# Patient Record
Sex: Female | Born: 1950 | Race: White | Hispanic: No | Marital: Single | Smoking: Current every day smoker
Health system: Southern US, Community
[De-identification: ages and names within clinical notes are randomized; demographics above are authoritative.]

## PROBLEM LIST (undated history)

## (undated) DIAGNOSIS — F329 Major depressive disorder, single episode, unspecified: Secondary | ICD-10-CM

## (undated) DIAGNOSIS — I341 Nonrheumatic mitral (valve) prolapse: Secondary | ICD-10-CM

## (undated) DIAGNOSIS — F32A Depression, unspecified: Secondary | ICD-10-CM

## (undated) DIAGNOSIS — G8929 Other chronic pain: Secondary | ICD-10-CM

## (undated) DIAGNOSIS — M1990 Unspecified osteoarthritis, unspecified site: Secondary | ICD-10-CM

## (undated) DIAGNOSIS — J45909 Unspecified asthma, uncomplicated: Secondary | ICD-10-CM

## (undated) DIAGNOSIS — F419 Anxiety disorder, unspecified: Secondary | ICD-10-CM

## (undated) DIAGNOSIS — E7800 Pure hypercholesterolemia, unspecified: Secondary | ICD-10-CM

## (undated) DIAGNOSIS — K219 Gastro-esophageal reflux disease without esophagitis: Secondary | ICD-10-CM

## (undated) DIAGNOSIS — M8580 Other specified disorders of bone density and structure, unspecified site: Secondary | ICD-10-CM

## (undated) DIAGNOSIS — K5900 Constipation, unspecified: Secondary | ICD-10-CM

## (undated) DIAGNOSIS — I10 Essential (primary) hypertension: Secondary | ICD-10-CM

## (undated) DIAGNOSIS — I499 Cardiac arrhythmia, unspecified: Secondary | ICD-10-CM

## (undated) HISTORY — PX: BREAST SURGERY: SHX581

## (undated) HISTORY — PX: ABDOMINAL HYSTERECTOMY: SHX81

## (undated) HISTORY — PX: ROTATOR CUFF REPAIR: SHX139

## (undated) HISTORY — PX: WRIST SURGERY: SHX841

## (undated) HISTORY — PX: BACK SURGERY: SHX140

## (undated) HISTORY — PX: TONSILLECTOMY: SUR1361

## (undated) HISTORY — PX: NECK SURGERY: SHX720

## (undated) HISTORY — PX: JOINT REPLACEMENT: SHX530

## (undated) HISTORY — PX: OTHER SURGICAL HISTORY: SHX169

---

## 1983-09-16 HISTORY — PX: ABDOMINAL HYSTERECTOMY: SHX81

## 1984-09-15 HISTORY — PX: ORIF WRIST FRACTURE: SHX2133

## 1999-05-09 ENCOUNTER — Encounter: Admission: RE | Admit: 1999-05-09 | Discharge: 1999-05-09 | Payer: Self-pay | Admitting: *Deleted

## 1999-09-26 ENCOUNTER — Emergency Department (HOSPITAL_COMMUNITY): Admission: EM | Admit: 1999-09-26 | Discharge: 1999-09-27 | Payer: Self-pay | Admitting: Emergency Medicine

## 1999-10-17 ENCOUNTER — Encounter: Payer: Self-pay | Admitting: Family Medicine

## 1999-10-17 ENCOUNTER — Encounter: Admission: RE | Admit: 1999-10-17 | Discharge: 1999-10-17 | Payer: Self-pay | Admitting: Family Medicine

## 2000-05-15 ENCOUNTER — Encounter: Payer: Self-pay | Admitting: Urology

## 2000-05-28 ENCOUNTER — Inpatient Hospital Stay (HOSPITAL_COMMUNITY): Admission: EM | Admit: 2000-05-28 | Discharge: 2000-05-30 | Payer: Self-pay | Admitting: Urology

## 2002-06-12 ENCOUNTER — Emergency Department (HOSPITAL_COMMUNITY): Admission: EM | Admit: 2002-06-12 | Discharge: 2002-06-12 | Payer: Self-pay | Admitting: Emergency Medicine

## 2002-08-04 ENCOUNTER — Encounter: Payer: Self-pay | Admitting: Neurosurgery

## 2002-08-04 ENCOUNTER — Observation Stay (HOSPITAL_COMMUNITY): Admission: RE | Admit: 2002-08-04 | Discharge: 2002-08-05 | Payer: Self-pay | Admitting: Neurosurgery

## 2003-06-07 ENCOUNTER — Emergency Department (HOSPITAL_COMMUNITY): Admission: EM | Admit: 2003-06-07 | Discharge: 2003-06-07 | Payer: Self-pay | Admitting: Emergency Medicine

## 2003-08-30 ENCOUNTER — Emergency Department (HOSPITAL_COMMUNITY): Admission: EM | Admit: 2003-08-30 | Discharge: 2003-08-30 | Payer: Self-pay | Admitting: Emergency Medicine

## 2003-12-19 ENCOUNTER — Emergency Department (HOSPITAL_COMMUNITY): Admission: EM | Admit: 2003-12-19 | Discharge: 2003-12-19 | Payer: Self-pay | Admitting: Emergency Medicine

## 2003-12-21 ENCOUNTER — Ambulatory Visit (HOSPITAL_COMMUNITY): Admission: RE | Admit: 2003-12-21 | Discharge: 2003-12-21 | Payer: Self-pay | Admitting: Family Medicine

## 2004-09-30 ENCOUNTER — Inpatient Hospital Stay (HOSPITAL_COMMUNITY): Admission: EM | Admit: 2004-09-30 | Discharge: 2004-10-04 | Payer: Self-pay | Admitting: Emergency Medicine

## 2004-10-22 ENCOUNTER — Ambulatory Visit: Payer: Self-pay | Admitting: Internal Medicine

## 2005-01-29 ENCOUNTER — Ambulatory Visit: Payer: Self-pay | Admitting: Internal Medicine

## 2005-08-01 ENCOUNTER — Ambulatory Visit: Payer: Self-pay | Admitting: Internal Medicine

## 2005-11-04 ENCOUNTER — Ambulatory Visit: Payer: Self-pay | Admitting: Internal Medicine

## 2006-01-08 ENCOUNTER — Ambulatory Visit: Payer: Self-pay | Admitting: Internal Medicine

## 2006-03-19 ENCOUNTER — Ambulatory Visit: Payer: Self-pay | Admitting: Internal Medicine

## 2006-03-25 ENCOUNTER — Ambulatory Visit (HOSPITAL_BASED_OUTPATIENT_CLINIC_OR_DEPARTMENT_OTHER): Admission: RE | Admit: 2006-03-25 | Discharge: 2006-03-26 | Payer: Self-pay | Admitting: Orthopedic Surgery

## 2006-10-07 ENCOUNTER — Ambulatory Visit (HOSPITAL_BASED_OUTPATIENT_CLINIC_OR_DEPARTMENT_OTHER): Admission: RE | Admit: 2006-10-07 | Discharge: 2006-10-08 | Payer: Self-pay | Admitting: Orthopedic Surgery

## 2007-03-31 ENCOUNTER — Ambulatory Visit: Payer: Self-pay | Admitting: Internal Medicine

## 2007-04-21 ENCOUNTER — Ambulatory Visit: Payer: Self-pay | Admitting: Internal Medicine

## 2007-05-10 ENCOUNTER — Emergency Department (HOSPITAL_COMMUNITY): Admission: EM | Admit: 2007-05-10 | Discharge: 2007-05-10 | Payer: Self-pay | Admitting: Emergency Medicine

## 2007-07-06 ENCOUNTER — Encounter: Payer: Self-pay | Admitting: Internal Medicine

## 2007-07-06 ENCOUNTER — Telehealth: Payer: Self-pay | Admitting: Internal Medicine

## 2007-07-20 ENCOUNTER — Telehealth: Payer: Self-pay | Admitting: Internal Medicine

## 2007-09-02 ENCOUNTER — Telehealth: Payer: Self-pay | Admitting: Internal Medicine

## 2007-09-08 ENCOUNTER — Telehealth: Payer: Self-pay | Admitting: Internal Medicine

## 2007-10-14 ENCOUNTER — Telehealth: Payer: Self-pay | Admitting: Internal Medicine

## 2007-11-11 ENCOUNTER — Encounter: Payer: Self-pay | Admitting: Internal Medicine

## 2007-11-11 DIAGNOSIS — F329 Major depressive disorder, single episode, unspecified: Secondary | ICD-10-CM | POA: Insufficient documentation

## 2007-11-11 DIAGNOSIS — M810 Age-related osteoporosis without current pathological fracture: Secondary | ICD-10-CM | POA: Insufficient documentation

## 2007-11-11 DIAGNOSIS — F32A Depression, unspecified: Secondary | ICD-10-CM | POA: Insufficient documentation

## 2007-11-17 ENCOUNTER — Ambulatory Visit: Payer: Self-pay | Admitting: Internal Medicine

## 2007-11-17 DIAGNOSIS — M542 Cervicalgia: Secondary | ICD-10-CM | POA: Insufficient documentation

## 2007-11-18 ENCOUNTER — Telehealth: Payer: Self-pay | Admitting: Internal Medicine

## 2007-11-21 ENCOUNTER — Encounter: Payer: Self-pay | Admitting: Internal Medicine

## 2007-12-13 ENCOUNTER — Telehealth: Payer: Self-pay | Admitting: Internal Medicine

## 2008-01-09 ENCOUNTER — Encounter: Payer: Self-pay | Admitting: Internal Medicine

## 2008-01-10 ENCOUNTER — Emergency Department (HOSPITAL_COMMUNITY): Admission: EM | Admit: 2008-01-10 | Discharge: 2008-01-10 | Payer: Self-pay | Admitting: Emergency Medicine

## 2008-01-27 ENCOUNTER — Emergency Department (HOSPITAL_COMMUNITY): Admission: EM | Admit: 2008-01-27 | Discharge: 2008-01-28 | Payer: Self-pay | Admitting: Emergency Medicine

## 2008-02-14 ENCOUNTER — Emergency Department (HOSPITAL_COMMUNITY): Admission: EM | Admit: 2008-02-14 | Discharge: 2008-02-14 | Payer: Self-pay | Admitting: Emergency Medicine

## 2008-03-01 ENCOUNTER — Encounter: Payer: Self-pay | Admitting: Emergency Medicine

## 2008-03-01 ENCOUNTER — Inpatient Hospital Stay (HOSPITAL_COMMUNITY): Admission: EM | Admit: 2008-03-01 | Discharge: 2008-03-05 | Payer: Self-pay | Admitting: Internal Medicine

## 2008-03-10 ENCOUNTER — Encounter
Admission: RE | Admit: 2008-03-10 | Discharge: 2008-06-08 | Payer: Self-pay | Admitting: Physical Medicine & Rehabilitation

## 2008-03-14 ENCOUNTER — Ambulatory Visit: Payer: Self-pay | Admitting: Physical Medicine & Rehabilitation

## 2008-04-10 ENCOUNTER — Ambulatory Visit: Payer: Self-pay | Admitting: Physical Medicine & Rehabilitation

## 2008-05-05 ENCOUNTER — Ambulatory Visit: Payer: Self-pay | Admitting: Physical Medicine & Rehabilitation

## 2008-06-01 ENCOUNTER — Ambulatory Visit: Payer: Self-pay | Admitting: Physical Medicine & Rehabilitation

## 2008-06-23 ENCOUNTER — Encounter
Admission: RE | Admit: 2008-06-23 | Discharge: 2008-09-21 | Payer: Self-pay | Admitting: Physical Medicine & Rehabilitation

## 2008-06-27 ENCOUNTER — Ambulatory Visit: Payer: Self-pay | Admitting: Physical Medicine & Rehabilitation

## 2008-07-12 ENCOUNTER — Emergency Department (HOSPITAL_BASED_OUTPATIENT_CLINIC_OR_DEPARTMENT_OTHER): Admission: EM | Admit: 2008-07-12 | Discharge: 2008-07-12 | Payer: Self-pay | Admitting: Emergency Medicine

## 2008-07-25 ENCOUNTER — Ambulatory Visit: Payer: Self-pay | Admitting: Physical Medicine & Rehabilitation

## 2008-08-21 ENCOUNTER — Ambulatory Visit: Payer: Self-pay | Admitting: Physical Medicine & Rehabilitation

## 2008-09-18 ENCOUNTER — Encounter
Admission: RE | Admit: 2008-09-18 | Discharge: 2008-12-17 | Payer: Self-pay | Admitting: Physical Medicine & Rehabilitation

## 2008-09-19 ENCOUNTER — Ambulatory Visit: Payer: Self-pay | Admitting: Physical Medicine & Rehabilitation

## 2008-10-17 ENCOUNTER — Ambulatory Visit: Payer: Self-pay | Admitting: Physical Medicine & Rehabilitation

## 2008-10-17 ENCOUNTER — Emergency Department (HOSPITAL_COMMUNITY): Admission: EM | Admit: 2008-10-17 | Discharge: 2008-10-18 | Payer: Self-pay | Admitting: Emergency Medicine

## 2008-11-14 ENCOUNTER — Ambulatory Visit: Payer: Self-pay | Admitting: Physical Medicine & Rehabilitation

## 2008-11-30 ENCOUNTER — Ambulatory Visit (HOSPITAL_COMMUNITY)
Admission: RE | Admit: 2008-11-30 | Discharge: 2008-11-30 | Payer: Self-pay | Admitting: Physical Medicine & Rehabilitation

## 2008-12-01 ENCOUNTER — Encounter: Admission: RE | Admit: 2008-12-01 | Discharge: 2009-03-01 | Payer: Self-pay | Admitting: Anesthesiology

## 2008-12-05 ENCOUNTER — Ambulatory Visit: Payer: Self-pay | Admitting: Anesthesiology

## 2008-12-12 ENCOUNTER — Encounter
Admission: RE | Admit: 2008-12-12 | Discharge: 2008-12-18 | Payer: Self-pay | Admitting: Physical Medicine & Rehabilitation

## 2008-12-18 ENCOUNTER — Ambulatory Visit: Payer: Self-pay | Admitting: Physical Medicine & Rehabilitation

## 2009-01-16 ENCOUNTER — Ambulatory Visit: Payer: Self-pay | Admitting: Anesthesiology

## 2009-02-14 ENCOUNTER — Encounter
Admission: RE | Admit: 2009-02-14 | Discharge: 2009-02-14 | Payer: Self-pay | Admitting: Physical Medicine & Rehabilitation

## 2009-03-31 ENCOUNTER — Emergency Department (HOSPITAL_COMMUNITY): Admission: EM | Admit: 2009-03-31 | Discharge: 2009-04-01 | Payer: Self-pay | Admitting: Emergency Medicine

## 2009-04-01 ENCOUNTER — Inpatient Hospital Stay (HOSPITAL_COMMUNITY): Admission: EM | Admit: 2009-04-01 | Discharge: 2009-04-03 | Payer: Self-pay | Admitting: Emergency Medicine

## 2009-04-04 ENCOUNTER — Emergency Department (HOSPITAL_COMMUNITY): Admission: EM | Admit: 2009-04-04 | Discharge: 2009-04-04 | Payer: Self-pay | Admitting: Emergency Medicine

## 2009-11-16 ENCOUNTER — Emergency Department (HOSPITAL_COMMUNITY): Admission: EM | Admit: 2009-11-16 | Discharge: 2009-11-16 | Payer: Self-pay | Admitting: Emergency Medicine

## 2009-12-18 ENCOUNTER — Emergency Department (HOSPITAL_COMMUNITY): Admission: EM | Admit: 2009-12-18 | Discharge: 2009-12-18 | Payer: Self-pay | Admitting: Emergency Medicine

## 2010-01-05 ENCOUNTER — Observation Stay (HOSPITAL_COMMUNITY): Admission: EM | Admit: 2010-01-05 | Discharge: 2010-01-06 | Payer: Self-pay | Admitting: Emergency Medicine

## 2010-02-21 ENCOUNTER — Ambulatory Visit: Payer: Self-pay | Admitting: Family Medicine

## 2010-02-21 DIAGNOSIS — E785 Hyperlipidemia, unspecified: Secondary | ICD-10-CM | POA: Insufficient documentation

## 2010-02-25 ENCOUNTER — Encounter (INDEPENDENT_AMBULATORY_CARE_PROVIDER_SITE_OTHER): Payer: Self-pay | Admitting: *Deleted

## 2010-02-28 ENCOUNTER — Telehealth (INDEPENDENT_AMBULATORY_CARE_PROVIDER_SITE_OTHER): Payer: Self-pay | Admitting: *Deleted

## 2010-03-01 ENCOUNTER — Telehealth: Payer: Self-pay | Admitting: Family Medicine

## 2010-03-07 ENCOUNTER — Telehealth (INDEPENDENT_AMBULATORY_CARE_PROVIDER_SITE_OTHER): Payer: Self-pay | Admitting: *Deleted

## 2010-03-14 ENCOUNTER — Ambulatory Visit: Payer: Self-pay | Admitting: Internal Medicine

## 2010-03-14 ENCOUNTER — Encounter (INDEPENDENT_AMBULATORY_CARE_PROVIDER_SITE_OTHER): Payer: Self-pay | Admitting: *Deleted

## 2010-03-19 ENCOUNTER — Telehealth: Payer: Self-pay | Admitting: Internal Medicine

## 2010-03-27 ENCOUNTER — Encounter: Payer: Self-pay | Admitting: Sports Medicine

## 2010-03-27 ENCOUNTER — Telehealth: Payer: Self-pay | Admitting: Sports Medicine

## 2010-03-27 ENCOUNTER — Ambulatory Visit: Payer: Self-pay | Admitting: Family Medicine

## 2010-03-27 DIAGNOSIS — Z9889 Other specified postprocedural states: Secondary | ICD-10-CM | POA: Insufficient documentation

## 2010-04-16 ENCOUNTER — Telehealth (INDEPENDENT_AMBULATORY_CARE_PROVIDER_SITE_OTHER): Payer: Self-pay | Admitting: *Deleted

## 2010-04-18 ENCOUNTER — Ambulatory Visit: Payer: Self-pay | Admitting: Internal Medicine

## 2010-04-18 ENCOUNTER — Ambulatory Visit (HOSPITAL_COMMUNITY): Admission: RE | Admit: 2010-04-18 | Discharge: 2010-04-18 | Payer: Self-pay | Admitting: Internal Medicine

## 2010-04-20 ENCOUNTER — Encounter: Payer: Self-pay | Admitting: Internal Medicine

## 2010-04-29 ENCOUNTER — Telehealth: Payer: Self-pay | Admitting: Sports Medicine

## 2010-04-30 ENCOUNTER — Encounter: Payer: Self-pay | Admitting: Sports Medicine

## 2010-05-15 ENCOUNTER — Telehealth: Payer: Self-pay | Admitting: Internal Medicine

## 2010-05-15 ENCOUNTER — Encounter: Payer: Self-pay | Admitting: Internal Medicine

## 2010-05-22 ENCOUNTER — Telehealth: Payer: Self-pay | Admitting: Sports Medicine

## 2010-06-01 ENCOUNTER — Telehealth: Payer: Self-pay | Admitting: Family Medicine

## 2010-06-01 ENCOUNTER — Observation Stay (HOSPITAL_COMMUNITY): Admission: EM | Admit: 2010-06-01 | Discharge: 2010-06-01 | Payer: Self-pay | Admitting: Emergency Medicine

## 2010-06-03 ENCOUNTER — Telehealth: Payer: Self-pay | Admitting: Sports Medicine

## 2010-06-04 ENCOUNTER — Telehealth: Payer: Self-pay | Admitting: Sports Medicine

## 2010-06-14 ENCOUNTER — Telehealth: Payer: Self-pay | Admitting: Sports Medicine

## 2010-06-20 ENCOUNTER — Encounter: Payer: Self-pay | Admitting: Sports Medicine

## 2010-07-31 ENCOUNTER — Encounter: Admission: RE | Admit: 2010-07-31 | Discharge: 2010-07-31 | Payer: Self-pay | Admitting: Orthopaedic Surgery

## 2010-09-26 ENCOUNTER — Encounter: Payer: Self-pay | Admitting: *Deleted

## 2010-09-28 ENCOUNTER — Emergency Department (HOSPITAL_COMMUNITY)
Admission: EM | Admit: 2010-09-28 | Discharge: 2010-09-29 | Payer: Self-pay | Source: Home / Self Care | Admitting: Emergency Medicine

## 2010-09-29 DIAGNOSIS — IMO0002 Reserved for concepts with insufficient information to code with codable children: Secondary | ICD-10-CM

## 2010-09-30 LAB — RAPID URINE DRUG SCREEN, HOSP PERFORMED
Amphetamines: NOT DETECTED
Barbiturates: NOT DETECTED
Benzodiazepines: POSITIVE — AB
Cocaine: NOT DETECTED
Opiates: NOT DETECTED
Tetrahydrocannabinol: NOT DETECTED

## 2010-09-30 LAB — CBC
HCT: 38.1 % (ref 36.0–46.0)
Hemoglobin: 12.8 g/dL (ref 12.0–15.0)
MCH: 31.4 pg (ref 26.0–34.0)
MCHC: 33.6 g/dL (ref 30.0–36.0)
MCV: 93.6 fL (ref 78.0–100.0)
Platelets: 296 10*3/uL (ref 150–400)
RBC: 4.07 MIL/uL (ref 3.87–5.11)
RDW: 14.4 % (ref 11.5–15.5)
WBC: 6.7 10*3/uL (ref 4.0–10.5)

## 2010-09-30 LAB — DIFFERENTIAL
Basophils Absolute: 0.1 10*3/uL (ref 0.0–0.1)
Basophils Relative: 1 % (ref 0–1)
Eosinophils Absolute: 0.2 10*3/uL (ref 0.0–0.7)
Eosinophils Relative: 4 % (ref 0–5)
Lymphocytes Relative: 36 % (ref 12–46)
Lymphs Abs: 2.4 10*3/uL (ref 0.7–4.0)
Monocytes Absolute: 0.6 10*3/uL (ref 0.1–1.0)
Monocytes Relative: 9 % (ref 3–12)
Neutro Abs: 3.4 10*3/uL (ref 1.7–7.7)
Neutrophils Relative %: 51 % (ref 43–77)

## 2010-09-30 LAB — URINALYSIS, ROUTINE W REFLEX MICROSCOPIC
Bilirubin Urine: NEGATIVE
Ketones, ur: NEGATIVE mg/dL
Leukocytes, UA: NEGATIVE
Nitrite: NEGATIVE
Protein, ur: NEGATIVE mg/dL
Specific Gravity, Urine: 1.008 (ref 1.005–1.030)
Urine Glucose, Fasting: NEGATIVE mg/dL
Urobilinogen, UA: 0.2 mg/dL (ref 0.0–1.0)
pH: 6 (ref 5.0–8.0)

## 2010-09-30 LAB — BASIC METABOLIC PANEL
BUN: 10 mg/dL (ref 6–23)
CO2: 27 mEq/L (ref 19–32)
Calcium: 9.4 mg/dL (ref 8.4–10.5)
Chloride: 108 mEq/L (ref 96–112)
Creatinine, Ser: 0.79 mg/dL (ref 0.4–1.2)
GFR calc Af Amer: >60 mL/min (ref >60)
GFR calc non Af Amer: >60 mL/min (ref >60)
Glucose, Bld: 105 mg/dL — ABNORMAL HIGH (ref 70–99)
Potassium: 4.3 mEq/L (ref 3.5–5.1)
Sodium: 140 mEq/L (ref 135–145)

## 2010-09-30 LAB — URINE MICROSCOPIC-ADD ON

## 2010-09-30 LAB — ETHANOL: Alcohol, Ethyl (B): <5 mg/dL (ref 0–10)

## 2010-10-05 ENCOUNTER — Encounter: Payer: Self-pay | Admitting: Orthopaedic Surgery

## 2010-10-06 ENCOUNTER — Encounter: Payer: Self-pay | Admitting: Physical Medicine & Rehabilitation

## 2010-10-06 ENCOUNTER — Observation Stay (HOSPITAL_COMMUNITY)
Admission: EM | Admit: 2010-10-06 | Discharge: 2010-10-07 | Payer: Self-pay | Source: Home / Self Care | Admitting: Emergency Medicine

## 2010-10-06 ENCOUNTER — Encounter: Payer: Self-pay | Admitting: Internal Medicine

## 2010-10-08 LAB — CK TOTAL AND CKMB (NOT AT ARMC)
CK, MB: 4.2 ng/mL — ABNORMAL HIGH (ref 0.3–4.0)
Relative Index: 3 — ABNORMAL HIGH (ref 0.0–2.5)

## 2010-10-08 LAB — URINALYSIS, ROUTINE W REFLEX MICROSCOPIC
Bilirubin Urine: NEGATIVE
Ketones, ur: NEGATIVE mg/dL
Protein, ur: NEGATIVE mg/dL
Urine Glucose, Fasting: NEGATIVE mg/dL
pH: 6.5 (ref 5.0–8.0)

## 2010-10-08 LAB — WET PREP, GENITAL
Clue Cells Wet Prep HPF POC: NONE SEEN
Trich, Wet Prep: NONE SEEN

## 2010-10-08 LAB — CBC
Platelets: 311 10*3/uL (ref 150–400)
RBC: 3.93 MIL/uL (ref 3.87–5.11)
RDW: 14.3 % (ref 11.5–15.5)
WBC: 5.5 10*3/uL (ref 4.0–10.5)

## 2010-10-08 LAB — TROPONIN I: Troponin I: 0.02 ng/mL (ref 0.00–0.06)

## 2010-10-08 LAB — POCT I-STAT, CHEM 8
BUN: 18 mg/dL (ref 6–23)
Calcium, Ion: 1.07 mmol/L — ABNORMAL LOW (ref 1.12–1.32)
Chloride: 108 mEq/L (ref 96–112)
Glucose, Bld: 93 mg/dL (ref 70–99)
HCT: 37 % (ref 36.0–46.0)
TCO2: 24 mmol/L (ref 0–100)

## 2010-10-08 LAB — POCT CARDIAC MARKERS
CKMB, poc: <1 ng/mL — ABNORMAL LOW (ref 1.0–8.0)
CKMB, poc: <1 ng/mL — ABNORMAL LOW (ref 1.0–8.0)
CKMB, poc: 1.2 ng/mL (ref 1.0–8.0)
Myoglobin, poc: 49.9 ng/mL (ref 12–200)
Troponin i, poc: <0.05 ng/mL (ref 0.00–0.09)
Troponin i, poc: <0.05 ng/mL (ref 0.00–0.09)
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
## 2010-10-15 NOTE — Progress Notes (Signed)
Summary: phn msg  Phone Note Call from Patient Call back at Home Phone [phone]   Caller: Patient Summary of Call: needs to talk to nurse about pain clinic - wanted to know if she can try [facility]. Initial call taken by: De Nurse,  March 01, 2010 12:01 PM  Follow-up for Phone Call        spoke with patient and she doesn't know of a Pain Clinic in [facility]. advised her if she can find name of facility I will send in referral. we also looked on internet and cannot find a clinic in [facility].  patient states she had her records sent form Dr. Daleen Snook to the [facility] and they said they would not take her but  she wants [place] to try to make referral. advised her I will try. Follow-up by: Theresia Lo RN,  March 01, 2010 3:17 PM

## 2010-10-15 NOTE — Progress Notes (Signed)
Summary: [name]  Phone Note Call from Patient Call back at [facility] Phone [phone]   Caller: Patient Summary of Call: Checking on referral to painn clinic. Initial call taken by: Clydell Hakim,  February 28, 2010 2:03 PM  Follow-up for Phone Call        spoke with patient and advised  her that both pain clinics I had sent in referrals to declined to make appointment for her. also she has been to Dr. Vear [facility] pain clinic also in town. advised will discuss with Dr. Lafonda Mosses next week when she returns for further suggestions. Follow-up by: Theresia Lo RN,  February 28, 2010 4:36 PM  Additional Follow-up for Phone Call Additional follow up Details #1::        I don't know of anywhere else in town we can refer to.  However, if she is intersted, we could look into [facility] or [facility] (but she told me that she did not have transportation to get there).    If she is not interested in the academic centers, I would advise her to make a follow up appointment here to discuss alternatives to narcotics for managin g her pain.  THanks Additional Follow-up by: Asher Muir MD,  March 04, 2010 12:06 PM    Additional Follow-up for Phone Call Additional follow up Details #2::    patient is not interested in going to [facility] or [facility]. she does not have a car now. referral has been sent to [facility] today but patient states she had all her records form Dr. Baxter Hire sent there and they actually declined to see her. However she felt that maybe if we sent in the referral they would possibly take her. she does want to make appointment with her new doctor  will need to see who she will be assigned to. Follow-up by: Theresia Lo RN,  March 04, 2010 3:58 PM  Additional Follow-up for Phone Call Additional follow up Details #3:: Details for Additional Follow-up Action Taken: Has been reassigned to Dr. Benjamin Stain. Additional Follow-up by: Dennison Nancy RN,  March 04, 2010 4:11 PM  appointment scheduled  with Dr. Benjamin Stain for 03/20/2010. Theresia Lo RN  March 04, 2010 4:22 PM

## 2010-10-15 NOTE — Procedures (Signed)
Summary: Colonoscopy  Patient: [name] Note: All result statuses are Final unless otherwise noted.  Tests: (1) Colonoscopy (COL)   COL Colonoscopy           DONE     [facility]     [address] [place], [place]  [address]           COLONOSCOPY PROCEDURE REPORT           PATIENT:  Annette, Williams  MR#:  [id number]     BIRTHDATE:  [birth year], 59 yrs. old  GENDER:  female     ENDOSCOPIST:  Hedwig Morton. Juanda Chance, MD     REF. BY:     PROCEDURE DATE:  04/18/2010     PROCEDURE:  Colonoscopy 25956     ASA CLASS:  Class II     INDICATIONS:  screening last colon 10 years ago     MEDICATIONS:   See Anesthesia Report.           DESCRIPTION OF PROCEDURE:   After the risks benefits and     alternatives of the procedure were thoroughly explained, informed     consent was obtained.  Digital rectal exam was performed and     revealed no rectal masses.   The EC-[id number] ([id number]) endoscope was     introduced through the anus and advanced to the cecum, which was     identified by both the appendix and ileocecal valve, without     limitations.  The quality of the prep was good, using MiraLax.     The instrument was then slowly withdrawn as the colon was fully     examined.     <<PROCEDUREIMAGES>>           FINDINGS:  Two polyps were found. 8mm sessile polyp right colon     5 mm sessile polyps at 70 cm The polyps were removed using cold     biopsy forceps (see image003, image004, and image005).  Mild     diverticulosis was found (see image001).  This was otherwise a     normal examination of the colon (see image002, image001, and     image006).   Retroflexed views in the rectum revealed no     abnormalities.    The scope was then withdrawn from the patient     and the procedure completed.           COMPLICATIONS:  None     ENDOSCOPIC IMPRESSION:     1) Two polyps     2) Mild diverticulosis     3) Otherwise normal examination     s/p polypectomy     RECOMMENDATIONS:     1) Await  pathology results     2) High fiber diet with liberal fluid intake.     REPEAT EXAM:  In 5 year(s) for.           ______________________________     Hedwig Morton. Juanda Chance, MD           CC:  [facility]           n.     eSIGNED:   Hedwig Morton. Brodie at 04/18/2010 12:29 PM           [name], [name], [id number]  Note: An exclamation mark (!) indicates a result that was not dispersed into the flowsheet. Document Creation Date: 04/18/2010 12:30 PM _______________________________________________________________________  (1) Order result status: Final Collection or observation date-time:  04/18/2010 12:18 Requested date-time:  Receipt date-time:  Reported date-time:  Referring Physician:   Ordering Physician: Lina Sar [phone]) Specimen Source:  Source: Launa Grill Order Number: [id number] Lab site:

## 2010-10-15 NOTE — Progress Notes (Signed)
Summary: refill  Phone Note Refill Request Call back at Home Phone [phone] Message from:  Patient  would like a refill on albuterol for her to use occasionally [facility]  Initial call taken by: De Nurse,  March 27, 2010 2:20 PM  Follow-up for Phone Call        Hmm, not on our med list and no hx asthma.  Would need to see her if she is getting wheezy or SOB to work this up. Follow-up by: Rodney Langton MD,  March 27, 2010 2:28 PM  Additional Follow-up for Phone Call Additional follow up Details #1::        called and left message for her to make appt, Additional Follow-up by: De Nurse,  March 29, 2010 8:56 AM    Additional Follow-up for Phone Call Additional follow up Details #2::    Noted, pt will have office visit re: SOB. Follow-up by: Rodney Langton MD,  March 29, 2010 4:11 PM

## 2010-10-15 NOTE — Letter (Signed)
Summary: New Patient letter  [facility]  [address] [place], [place] [address]   Phone: [phone]  Fax: [phone]       05/15/2010 MRN: [id number]  [facility] [address] [place], [place]  [address]  Dear Ms. [facility],  Welcome to the Gastroenterology Division at [facility].    You are scheduled to see Dr.  Juanda Chance  on 07/01/10 at 8:45 a.m.  on the 3rd floor at [facility], [address]  We ask that you try to arrive at our office 15 minutes prior to your appointment time to allow for check-in.  We would like you to complete the enclosed self-administered evaluation form prior to your visit and bring it with you on the day of your appointment.  We will review it with you.  Also, please bring a complete list of all your medications or, if you prefer, bring the medication bottles and we will list them.  Please bring your insurance card so that we may make a copy of it.  If your insurance requires a referral to see a specialist, please bring your referral form from your primary care physician.  Co-payments are due at the time of your visit and may be paid by cash, check or credit card.     Your office visit will consist of a consult with your physician (includes a physical exam), any laboratory testing he/she may order, scheduling of any necessary diagnostic testing (e.g. x-ray, ultrasound, CT-scan), and scheduling of a procedure (e.g. Endoscopy, Colonoscopy) if required.  Please allow enough time on your schedule to allow for any/all of these possibilities.    If you cannot keep your appointment, please call [phone] to cancel or reschedule prior to your appointment date.  This allows [place] the opportunity to schedule an appointment for another patient in need of care.  If you do not cancel or reschedule by 5 p.m. the business day prior to your appointment date, you will be charged a $50.00 late cancellation/no-show fee.    Thank you for  choosing [facility] for your medical needs.  We appreciate the opportunity to care for you.  Please visit [place] at our website  to learn more about our practice.                     Sincerely,                                                             The Gastroenterology Division

## 2010-10-15 NOTE — Progress Notes (Signed)
Summary: Triage  Phone Note Call from Patient Call back at Home Phone [phone]   Caller: Patient Call For: Dr. Juanda Chance Reason for Call: Talk to Nurse Summary of Call: Pt. needs to r/s COL at the hosp. on 04-18-10 b/c she is going to be out of town Initial call taken by: Karna Christmas,  March 19, 2010 4:14 PM  Follow-up for Phone Call        Pt. is scheduled for a Colon/Propofol at [facility] on 04-18-10, she can move procedure to 05-16-10. Pt. declines, she doesn't want to wait until Sept., she will keep her appt. date on 04-18-10. Pt. instructed to call back as needed.  Follow-up by: Laureen Ochs LPN,  March 20, 9628 9:50 AM

## 2010-10-15 NOTE — Letter (Signed)
Summary: Patient Notice- Polyp Results  [facility]  [address] [place], [place] [address]   Phone: [phone]  Fax: [phone]        April 20, 2010 MRN: [id number]    [facility] [address] [place], [place]  [address]    Dear Ms. Bendix,  I am pleased to inform you that the colon polyp(s) removed during your recent colonoscopy was (were) found to be benign (no cancer detected) upon pathologic examination.The polyps were adenomatous ( precancerous)  I recommend you have a repeat colonoscopy examination in 5_ years to look for recurrent polyps, as having colon polyps increases your risk for having recurrent polyps or even colon cancer in the future.  Should you develop new or worsening symptoms of abdominal pain, bowel habit changes or bleeding from the rectum or bowels, please schedule an evaluation with either your primary care physician or with me.  Additional information/recommendations:  _x_ No further action with gastroenterology is needed at this time. Please      follow-up with your primary care physician for your other healthcare      needs.  __ Please call [phone] to schedule a return visit to review your      situation.  __ Please keep your follow-up visit as already scheduled.  __ Continue treatment plan as outlined the day of your exam.  Please call us if you are having persistent problems or have questions about your condition that have not been fully answered at this time.  Sincerely,  Hart Carwin MD  This letter has been electronically signed by your physician.  Appended Document: Patient Notice- Polyp Results Letter mailed to pt.

## 2010-10-15 NOTE — Progress Notes (Signed)
Summary: ? re prep  Phone Note Call from Patient Call back at Home Phone [phone]   Caller: Patient Call For: Juanda Chance Reason for Call: Talk to Nurse Summary of Call: Patient has questions regarding prep  Initial call taken by: Tawni Levy,  March 19, 2010 10:44 AM  Follow-up for Phone Call        Spoke with pt. She did not have questions about the prep but wanted to reschedule her colonoscopy appt. Will tranfer pt to scheduling to change her appt She will call back if she has further questions. Ulis Rias RN  March 19, 2010 12:37 PM

## 2010-10-15 NOTE — Progress Notes (Signed)
Summary: triage  Phone Note Call from Patient Call back at Home Phone [phone]   Caller: Patient Summary of Call: Pt went to ed for pain in her back.  Explained that she needed see Dr. Benjamin Stain per phone note by Dr. Jeanice Lim.  Pt says she is unable to come in due to hurting so bad.   Initial call taken by: Clydell Hakim,  June 03, 2010 9:07 AM  Follow-up for Phone Call        states they did xrays in ED. states her spine looks like an S.  states she was aware of this before the xray. states she cannot stand or function without pain meds. "I hurt so bad" "there is nothing I can do". has no ride to come in. states she got 3 shots of dilaudid & a written rx for percocet #20 when in ED.   takes 2 at a time & the relief lasts 2 hours. asked her to try & find a ride & call back for an appt. states she will.Golden Circle RN  June 03, 2010 9:31 AM  Follow-up by: Golden Circle RN,  June 03, 2010 9:24 AM  Additional Follow-up for Phone Call Additional follow up Details #1::        she can be here at 9:30 tomorrow. states she wants pain pills. appt made with Dr. Clotilde Dieter as pcp will not be here. message to pcp & [place] to discuss before appt Additional Follow-up by: Golden Circle RN,  June 03, 2010 9:52 AM    Additional Follow-up for Phone Call Additional follow up Details #2::    pt cancelled appt tues & will see pcp thursday Follow-up by: Golden Circle RN,  June 03, 2010 10:06 AM  Additional Follow-up for Phone Call Additional follow up Details #3:: Details for Additional Follow-up Action Taken: Noted, will see pt thursday. Additional Follow-up by: Rodney Langton MD,  June 03, 2010 11:16 AM

## 2010-10-15 NOTE — Letter (Signed)
Summary: Certification of Disability/Property Tax Exclusion  Certification of Disability/Property Tax Exclusion   Imported By: Knox Royalty 04/06/2010 15:59:04  _____________________________________________________________________  External Attachment:    Type:   Image     Comment:   External Document

## 2010-10-15 NOTE — Miscellaneous (Signed)
Summary: [facility] previsit  Clinical Lists Changes  Problems: Added new problem of SPECIAL SCREENING FOR MALIGNANT NEOPLASMS COLON (ICD-V76.51) Medications: Added new medication of DULCOLAX 5 MG  TBEC (BISACODYL) Day before procedure take 2 at 3pm and 2 at 8pm. - Signed Added new medication of METOCLOPRAMIDE HCL 10 MG  TABS (METOCLOPRAMIDE HCL) As per prep instructions. - Signed Added new medication of MIRALAX   POWD (POLYETHYLENE GLYCOL 3350) As per prep  instructions. - Signed Rx of DULCOLAX 5 MG  TBEC (BISACODYL) Day before procedure take 2 at 3pm and 2 at 8pm.;  #4 x 0;  Signed;  Entered by: Karl Bales RN;  Authorized by: Hart Carwin MD;  Method used: Electronically to [facility] [phone] [place], [place], [place]  [address], Ph: [phone], Fax: [phone] Rx of METOCLOPRAMIDE HCL 10 MG  TABS (METOCLOPRAMIDE HCL) As per prep instructions.;  #2 x 0;  Signed;  Entered by: Karl Bales RN;  Authorized by: Hart Carwin MD;  Method used: Electronically to [facility] [phone] [place], [place], [place]  [address], Ph: [phone], Fax: [phone] Rx of MIRALAX   POWD (POLYETHYLENE GLYCOL 3350) As per prep  instructions.;  #255gm x 0;  Signed;  Entered by: Karl Bales RN;  Authorized by: Hart Carwin MD;  Method used: Electronically to [facility] [phone] [place], [place], [place]  [address], Ph: [phone], Fax: [phone] Orders: Added new Test order of ZCOL (ZCOL) - Signed Allergies: Added new allergy or adverse reaction of DARVOCET Added new allergy or adverse reaction of HYDROCODONE    Prescriptions: MIRALAX   POWD (POLYETHYLENE GLYCOL 3350) As per prep  instructions.  #255gm x 0   Entered by:   Karl Bales RN   Authorized by:   Hart Carwin MD   Signed by:   Karl Bales RN on 03/14/2010   Method used:   Electronically to        [facility] [address] (retail)       [phone]       [place], [place]  [address]       Ph: [phone]       Fax: [phone]   RxID:   [id number] METOCLOPRAMIDE HCL 10 MG  TABS (METOCLOPRAMIDE HCL) As per prep instructions.  #2 x 0   Entered by:   Karl Bales RN   Authorized by:   Hart Carwin MD   Signed by:   Karl Bales RN on 03/14/2010   Method used:   Electronically to        [facility] [address] (retail)       [phone]       [place], [place]  [address]       Ph: [phone]       Fax: [phone]   RxID:   [id number] DULCOLAX 5 MG  TBEC (BISACODYL) Day before procedure take 2 at 3pm and 2 at 8pm.  #4 x 0   Entered by:   Karl Bales RN   Authorized by:   Hart Carwin MD   Signed by:   Karl Bales RN on 03/14/2010   Method used:   Electronically to        [facility] [address] (retail)       [phone]  [place], [place]  [address]       Ph: [phone]       Fax: [phone]   RxID:   [id number]   Appended Document: [facility] previsit ---- 03/19/2010 7:36 AM, Karl Bales RN wrote: Renato Gails.  Dr. Juanda Chance said she agreed that propofol is appropriate.  Thanks so much!
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
## 2010-10-15 NOTE — Procedures (Signed)
Summary: Prep/[facility]  Prep/[facility]   Imported By: Lester [place] 03/25/2010 08:03:15  _____________________________________________________________________  External Attachment:    Type:   Image     Comment:   External Document

## 2010-10-15 NOTE — Progress Notes (Signed)
Summary: triage  Phone Note Call from Patient Call back at Home Phone [phone]   Caller: Patient Summary of Call: Has question about medication she is taking. Initial call taken by: Clydell Hakim,  June 04, 2010 11:51 AM  Follow-up for Phone Call        states she went to ED this weekend & got #20 percocet. using "sparingly" wants a refill until seen. told her we had already talked about pain meds & she had cancelled the appt thursday since pcp will not give her any narcotics. states she does not remember this. told her we are waiting to get her into a pain center. she declined to make an appt since she was not going to get more percocet Follow-up by: Golden Circle RN,  June 04, 2010 12:00 PM  Additional Follow-up for Phone Call Additional follow up Details #1::        Noted, will forward this to [facility]. Question, does refusal to make appt because she won't be getting narcotics from here and now an upcoming new pt appt with [facility] constitute doctor shopping?   Additional Follow-up by: Rodney Langton MD,  June 04, 2010 2:00 PM

## 2010-10-15 NOTE — Progress Notes (Signed)
Summary: Difficulty swallowing  Phone Note Call from Patient Call back at Home Phone [phone]   Call For: Dr Juanda Chance Reason for Call: Talk to Nurse Summary of Call: Thinks she might need and Endo like her Dad is having difficulty swallowing. Initial call taken by: Leanor Kail [facility],  May 15, 2010 12:45 PM  Follow-up for Phone Call        Patient c/o occasional dysphagia she is scheduled to see Dr Juanda Chance 07/01/10 8:45. Follow-up by: Darcey Nora RN, CGRN,  May 15, 2010 1:46 PM

## 2010-10-15 NOTE — Progress Notes (Signed)
  Phone Note From Other Clinic   Caller: [facility] Details for Reason: Neck pain/chronic pain Summary of Call: Pt in ER for chronic pain, EDP called about prescribing her narcotics, last note visualzied that shows MD will give NSAIDS and will await a pain clinic secondary to history and co-morbid psychiatric illness. Noted [place] [facility] pending. EDP wanted to prescribe by mouth dilaudid, has allergy to hydrocodone. as previously on Morphine, told to give her oxycodone tabs, max is 20. Will need to follow this with PCP. Also told EDP PCP likely will not prescribe and we will try our best to get her into pain clinic. She needs to come to PCP to be seen for these issues and work out the paper work per previous phone notes. Initial call taken by: Milinda Antis MD,  June 01, 2010 7:51 PM

## 2010-10-15 NOTE — Letter (Signed)
Summary: Dept of corrections report  Dept of corrections report   Imported By: De Nurse 06/20/2010 16:05:01  _____________________________________________________________________  External Attachment:    Type:   Image     Comment:   External Document

## 2010-10-15 NOTE — Letter (Signed)
Summary: Miralax Instructions  [facility]  [address]   [place], [place] [address]   Phone: [phone]  Fax: [phone]       [name]    03/03/1951    MRN: [id number]       Procedure Day Dorna Bloom: Thursday, 04-18-10     Arrival Time: 11:00 a.m.     Procedure Time: 1:00 p.m.     Location of Procedure:                     x   [facility] ( Outpatient Registration)    PREPARATION FOR COLONOSCOPY WITH MIRALAX  Starting 5 days prior to your procedure 04-14-10  do not eat nuts, seeds, popcorn, corn, beans, peas,  salads, or any raw vegetables.  Do not take any fiber supplements (e.g. Metamucil, Citrucel, and Benefiber). ____________________________________________________________________________________________________   THE DAY BEFORE YOUR PROCEDURE         DATE: 04-17-10 DAY: Wednesday  1   Drink clear liquids the entire day-NO SOLID FOOD  2   Do not drink anything colored red or purple.  Avoid juices with pulp.  No orange juice.  3   Drink at least 64 oz. (8 glasses) of fluid/clear liquids during the day to prevent dehydration and help the prep work efficiently.  CLEAR LIQUIDS INCLUDE: Water Jello Ice Popsicles Tea (sugar ok, no milk/cream) Powdered fruit flavored drinks Coffee (sugar ok, no milk/cream) Gatorade Juice: apple, white grape, white cranberry  Lemonade Clear bullion, consomm, broth Carbonated beverages (any kind) Strained chicken noodle soup Hard Candy  4   Mix the entire bottle of Miralax with 64 oz. of Gatorade/Powerade in the morning and put in the refrigerator to chill.  5   At 3:00 pm take 2 Dulcolax/Bisacodyl tablets.  6   At 4:30 pm take one Reglan/Metoclopramide tablet.  7  Starting at 5:00 pm drink one 8 oz glass of the Miralax mixture every 15-20 minutes until you have finished drinking the entire 64 oz.  You should finish drinking prep around 7:30 or 8:00 pm.  8   If you are nauseated, you may take the 2nd  Reglan/Metoclopramide tablet at 6:30 pm.        9    At 8:00 pm take 2 more DULCOLAX/Bisacodyl tablets.     THE DAY OF YOUR PROCEDURE      DATE:  04-18-10  DAY: Thursday   NOTHING TO EAT OR DRINK AFTER MIDNIGHT .   MEDICATION INSTRUCTIONS  Unless otherwise instructed, you should take regular prescription medications with a small sip of water as early as possible the morning of your procedure.          OTHER INSTRUCTIONS  You will need a responsible adult at least 62 years of age to accompany you and drive you home.   This person must remain in the waiting room during your procedure.  Wear loose fitting clothing that is easily removed.  Leave jewelry and other valuables at home.  However, you may wish to bring a book to read or an iPod/MP3 player to listen to music as you wait for your procedure to start.  Remove all body piercing jewelry and leave at home.  Total time from sign-in until discharge is approximately 2-3 hours.  You should go home directly after your procedure and rest.  You can resume normal activities the day after your procedure.  The day of your procedure you should not:   Drive  Make legal decisions   Operate machinery   Drink alcohol   Return to work  You will receive specific instructions about eating, activities and medications before you leave.   The above instructions have been reviewed and explained to me by   Karl Bales RN  March 14, 2010 12:11 PM  I fully understand and can verbalize these instructions _____________________________ Date _______

## 2010-10-15 NOTE — Letter (Signed)
Summary: Previsit letter  [facility]  [address] [place], [place] [address]   Phone: [phone]  Fax: [phone]       02/25/2010 MRN: [id number]  [name] [address] [place], [place]  [address]  Dear Ms. [facility],  Welcome to the Gastroenterology Division at [facility].    You are scheduled to see a nurse for your pre-procedure visit on 03-12-10 at 1:00p.m. on the 3rd floor at [facility], [address]  We ask that you try to arrive at our office 15 minutes prior to your appointment time to allow for check-in.  Your nurse visit will consist of discussing your medical and surgical history, your immediate family medical history, and your medications.    Please bring a complete list of all your medications or, if you prefer, bring the medication bottles and we will list them.  We will need to be aware of both prescribed and over the counter drugs.  We will need to know exact dosage information as well.  If you are on blood thinners (Coumadin, Plavix, Aggrenox, Ticlid, etc.) please call our office today/prior to your appointment, as we need to consult with your physician about holding your medication.   Please be prepared to read and sign documents such as consent forms, a financial agreement, and acknowledgement forms.  If necessary, and with your consent, a friend or relative is welcome to sit-in on the nurse visit with you.  Please bring your insurance card so that we may make a copy of it.  If your insurance requires a referral to see a specialist, please bring your referral form from your primary care physician.  No co-pay is required for this nurse visit.     If you cannot keep your appointment, please call [phone] to cancel or reschedule prior to your appointment date.  This allows [place] the opportunity to schedule an appointment for another patient in need of care.    Thank you for choosing [facility] for your medical needs.  We  appreciate the opportunity to care for you.  Please visit [place] at our website  to learn more about our practice.                     Sincerely.                                                                                                                   The Gastroenterology Division
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
## 2010-10-15 NOTE — Progress Notes (Signed)
  Phone Note Call from Patient   Caller: Patient Call For: [phone] Summary of Call: Calling to request referral to a methadone clinic.  Pt hasn't seen physician yet regarding this referral, but next appt won't be unti 10.13 and patient does not want to wait that long.  Would like nurse to call to see if the process can be expeditied quicker. Initial call taken by: Britta Mccreedy mcgregor  Follow-up for Phone Call        to pcp. this is the 4th time she has called this am Follow-up by: Golden Circle RN,  June 03, 2010 10:25 AM  Additional Follow-up for Phone Call Additional follow up Details #1::        She already has referral to pain clinic. Additional Follow-up by: Rodney Langton MD,  June 03, 2010 11:16 AM    Additional Follow-up for Phone Call Additional follow up Details #2::    yes but she wants to see you & was clear that she expected pain meds. what shall I tell her? do you want to speak with her? Follow-up by: Golden Circle RN,  June 03, 2010 11:41 AM  Additional Follow-up for Phone Call Additional follow up Details #3:: Details for Additional Follow-up Action Taken: I have already discussed pain meds and narcotics with her at the previous visit.  We also have the visit video recorded (precepted).  I have been clear with her that I will not be rxing narcotics as I did not think they were appropriate.  She needs to wait until pain clinic eval.  She is not to call again for this issue.  I will manage other issues that she has however.  If she becomes irate and/or disrespectful please let me and Dennison Nancy know. Additional Follow-up by: Rodney Langton MD,  June 03, 2010 12:26 PM  I called & read her part of md response. I reviewed our attempts to get her into a pain clinic. she has been declined by 3 so far. I asked if there was one she might get into/has not been to. she said never mind.  she wants the appt she has later this week cancelled.Golden Circle RN  June 03, 2010 1:59 PM  Rip Harbour will discuss with her at appt.  Again I will not be rxing narcotics. Rodney Langton MD  June 03, 2010 5:21 PM

## 2010-10-15 NOTE — Assessment & Plan Note (Signed)
Summary: new patient/patient summary   Vital Signs:  Patient profile:   60 year old female Height:      67 inches Weight:      132.7 pounds BMI:     20.86 Temp:     98.3 degrees F oral Pulse rate:   92 / minute BP sitting:   136 / 87  (left arm) Cuff size:   regular  Vitals Entered By: Gladstone Pih (February 21, 2010 2:51 PM) CC: new pt, chronic pain, hld, depression Is Patient Diabetic? No Pain Assessment Patient in pain? yes      Onset of pain  Chronic   CC:  new pt, chronic pain, hld, and depression.  History of Present Illness: 1.  neck pain--2003 neck fracture at work.  got surgery.  saw Dr. Posey Rea ([facility]) who maintained pain meds.  has chronic neck pain and headache.  she saw him once a month.  has been on methadone, which worked well, but was taken off of that.  took morphine.  then referred to pain clinic (Dr. Fritzi Mandes).  went there for about 1.5 years.  she cancelled an appt because she did not have the copay.  she was othen dismissed (about 1.5 years ago).  has not had pain meds since that time except for brief periods from the ER.    2.  high cholesterol--on simvastatin 20 mg  3.  depression/ anxiety--sees Dr. Otelia Santee (psychiatrist) who prescribes trazodone, wellbutrin, lorazepam and effexor  Habits & Providers  Alcohol-Tobacco-Diet     Tobacco Status: current     Tobacco Counseling: to quit use of tobacco products     Cigarette Packs/Day: 0.5  Current Medications (verified): 1)  Fosamax 70 Mg  Tabs (Alendronate Sodium) .Marland Kitchen.. 1 Weekly 2)  Aspirin 81 Mg Chew (Aspirin) .Marland Kitchen.. 1 By Mouth Daily 3)  Trazodone Hcl 100 Mg  Tabs (Trazodone Hcl) .... 2 Tabs By Mouth At Bedtime 4)  Wellbutrin Xl 300 Mg Xr24h-Tab (Bupropion Hcl) .Marland Kitchen.. 1 Tab By Mouth Daily 5)  Simvastatin 20 Mg Tabs (Simvastatin) .Marland Kitchen.. 1 Tab By Mouth Daily 6)  Flexeril 10 Mg Tabs (Cyclobenzaprine Hcl) .Marland Kitchen.. 1 Tab By Mouth Three Times A Day As Needed Spasm 7)  Lorazepam 1 Mg Tabs (Lorazepam) .Marland Kitchen.. 1 Tab By Mouth  Three Times A Day As Needed Anxiety 8)  Restoril 30 Mg Caps (Temazepam) .Marland Kitchen.. 1 Tab By Mouth At Bedtime 9)  Effexor Xr 75 Mg Xr24h-Cap (Venlafaxine Hcl) .... 2 Tabs By Mouth in Am and 1 Tab in Pm 10)  Calcium-Vitamin D 600-200 Mg-Unit Tabs (Calcium-Vitamin D) .Marland Kitchen.. 1 Tab By Mouth Two Times A Day  Allergies: 1)  ! Penicillin V Potassium (Penicillin V Potassium) 2)  ! Flexeril (Cyclobenzaprine Hcl)  Past History:  Past Medical History: Depression--[place] Otelia Sant[place] ([place]) GERD Osteoporosis, h/o L femur and L tibia fx 2000 Chronic cervical pain  Past Surgical History: Hysterectomy Hip fracture ORIF L cervical discectomy and fracture repair 2003--Dr. Venetia Maxon (neurosurgeon) and Turner Daniels (ortho)  Family History: Family History Hypertension sister with cerebral aneurysm; deceased brother with diabetes, alcoholism son with alcoholism parents alcoholics cva mother at 6  Social History: Occupation: disabled from neck injury Single Current Smoker 1/2 ppd (smoked since age 46 on avg 1ppd) Alcohol use-no; formerly an alcohol.  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   Neck:  No deformities, masses, or tenderness noted. Lungs:  Normal respiratory effort, chest expands symmetrically. Lungs are clear to auscultation, no crackles or wheezes. Heart:  Normal rate and regular rhythm. S1 and S2 normal without gallop, murmur, click, rub or other extra sounds. Abdomen:  Bowel sounds positive,abdomen soft and non-tender without masses, organomegaly or hernias noted. Pulses:  2+ dp pulses Extremities:  No clubbing, cyanosis, edema, or deformity noted  Neurologic:  no focal deficits noted Cervical Nodes:  No lymphadenopathy noted Psych:  Oriented  X3, memory intact for recent and remote, normally interactive, good eye contact, not anxious appearing, and not depressed appearing.   Additional Exam:  vital signs reviewed    Impression & Recommendations:  Problem # 1:  NECK PAIN, CHRONIC (ICD-723.1) Assessment New  I declined to prescribe narcotics today. Explained that I will graduate in 3 weeks and she will be assigned a new physician.   I am perfectly comfortable with flexeril.  I offered to refer her to pain managment center.  INitially, she tells me that she has tried to get into all of the local pain clinics, but has been rejected.  I explained that we could at least try again.  I think it would be best if she were managed by a pain clinic.  She agreed to referral and we will see if anyone in town accepts her.  She states she cannot go to [facility] or [facility] because of lack of transportation   The following medications were removed from the medication list:    Morphine Sulfate 15 Mg Tabs (Morphine sulfate) .Marland Kitchen... 1 qid as needed pain    Percocet 5-325 Mg Tabs (Oxycodone-acetaminophen) .Marland Kitchen... 1 qid as needed Her updated medication list for this problem includes:    Aspirin 81 Mg Chew (Aspirin) .Marland Kitchen... 1 by mouth daily    Flexeril 10 Mg Tabs (Cyclobenzaprine hcl) .Marland Kitchen... 1 tab by mouth three times a day as needed spasm  Orders: Pain Clinic Referral (Pain)  Problem # 2:  OSTEOPOROSIS (ICD-733.00) Assessment: Unchanged probably needs a repeat bone density.  states she has not had one in a long time.   The following medications were removed from the medication list:    Vitamin D3 1000 Unit Tabs (Cholecalciferol) .Marland Kitchen... 1 by mouth daily Her updated medication list for this problem includes:    Fosamax 70 Mg Tabs (Alendronate sodium) .Marland Kitchen... 1 weekly    Calcium-vitamin D 600-200 Mg-unit Tabs (Calcium-vitamin d) .Marland Kitchen... 1 tab by mouth two times a day  Problem # 3:  DEPRESSION (ICD-311) Assessment: New managment per psych.  on high dose of both  effexor and wellbutrin.  these meds should ONLY be refilled by psych, NOT by [facility] Her updated medication list for this problem includes:    Trazodone Hcl 100 Mg Tabs (Trazodone hcl) .Marland Kitchen... 2 tabs by mouth at bedtime; prescribed by psych    Wellbutrin Xl 300 Mg Xr24h-tab (Bupropion hcl) .Marland Kitchen... 1 tab by mouth daily; prescribed by psych    Lorazepam 1 Mg Tabs (Lorazepam) .Marland Kitchen... 1 tab by mouth three times a day as needed anxiety; prescribed by psych    Effexor Xr 75 Mg Xr24h-cap (Venlafaxine hcl) .Marland Kitchen... 2 tabs by mouth in am and 1 tab in pm; prescribed by psych  Problem # 4:  HYPERLIPIDEMIA (ICD-272.4) Assessment: New Should get lipid panel and cmet before we fill simva. Her updated medication list for this problem includes:    Simvastatin 20  Mg Tabs (Simvastatin) .Marland Kitchen... 1 tab by mouth daily  Problem # 5:  Preventive Health Care (ICD-V70.0) Assessment: Unchanged advised to get mammo should colonoscopy  Complete Medication List: 1)  Fosamax 70 Mg Tabs (Alendronate sodium) .Marland KitchenMarland KitchenMarland Kitchen 1 weekly 2)  Aspirin 81 Mg Chew (Aspirin) .Marland Kitchen.. 1 by mouth daily 3)  Trazodone Hcl 100 Mg Tabs (Trazodone hcl) .... 2 tabs by mouth at bedtime; prescribed by psych 4)  Wellbutrin Xl 300 Mg Xr24h-tab (Bupropion hcl) .Marland Kitchen.. 1 tab by mouth daily; prescribed by psych 5)  Simvastatin 20 Mg Tabs (Simvastatin) .Marland Kitchen.. 1 tab by mouth daily 6)  Flexeril 10 Mg Tabs (Cyclobenzaprine hcl) .Marland Kitchen.. 1 tab by mouth three times a day as needed spasm 7)  Lorazepam 1 Mg Tabs (Lorazepam) .Marland Kitchen.. 1 tab by mouth three times a day as needed anxiety; prescribed by psych 8)  Restoril 30 Mg Caps (Temazepam) .Marland Kitchen.. 1 tab by mouth at bedtime 9)  Effexor Xr 75 Mg Xr24h-cap (Venlafaxine hcl) .... 2 tabs by mouth in am and 1 tab in pm; prescribed by psych 10)  Calcium-vitamin D 600-200 Mg-unit Tabs (Calcium-vitamin d) .Marland Kitchen.. 1 tab by mouth two times a day  Patient Instructions: 1)  It was nice to see you today. 2)  We will see if we can get you an appointment with a  pain center. 3)  Please make an appointment for your mammogram 4)  Please schedule a follow-up appointment within 1-2 months to meet your new doctor and get labs/cholesterol.   Prevention & Chronic Care Immunizations   Influenza vaccine: Fluvax Non-MCR  (08/01/2005)    Tetanus booster: Not documented    Pneumococcal vaccine: Not documented    H. zoster vaccine: Not documented  Colorectal Screening   Hemoccult: Not documented    Colonoscopy: Not documented  Other Screening   Pap smear: Not documented   Pap smear action/deferral: Not indicated S/P hysterectomy  (02/21/2010)    Mammogram: Not documented    DXA bone density scan: Not documented   Smoking status: current  (02/21/2010)  Lipids   Total Cholesterol: Not documented   LDL: Not documented   LDL Direct: Not documented   HDL: Not documented   Triglycerides: Not documented    SGOT (AST): Not documented   SGPT (ALT): Not documented   Alkaline phosphatase: Not documented   Total bilirubin: Not documented  Self-Management Support :    Lipid self-management support: Not documented

## 2010-10-15 NOTE — Progress Notes (Signed)
Summary: phn msg  Phone Note Call from Patient Call back at [facility] Phone [phone]   Caller: Patient Summary of Call: pls call about Dr Murray Hodgkins- [facility] Initial call taken by: De Nurse,  March 07, 2010 1:58 PM  Follow-up for Phone Call        patient was calling to check on status of referral . advised have not heard from them yet. will call when we hear . Follow-up by: Theresia Lo RN,  March 07, 2010 2:15 PM     Appended Document: phn msg received call form [facility] and they decline to see patient . patient notified.

## 2010-10-15 NOTE — Progress Notes (Signed)
Summary: phn msg  Phone Note Call from Patient Call back at Home Phone [phone]   Caller: Patient Summary of Call: needs to talk to nurse about [facility] Initial call taken by: De Nurse,  April 16, 2010 11:43 AM  Follow-up for Phone Call        patient wants a referral to the [facility].  RN not familiar with this pain clinic but she wants me to find out about it . with try. Follow-up by: Theresia Lo RN,  April 16, 2010 5:09 PM  Additional Follow-up for Phone Call Additional follow up Details #1::        called [facility] and they do have a pain clinic . will make referral there as requested by patient. patient notified. Additional Follow-up by: Theresia Lo RN,  April 17, 2010 8:37 AM

## 2010-10-15 NOTE — Progress Notes (Signed)
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 says she is still active & plays tennis. says she has been thrown out & no one wants to help.  told her we are trying. we are on her 4th referral to a pain clinic.   states her neurologist told her there was nothing he could do.   she had been dismissed from one because she cancelled an appt & she had no money to pay them. says once she is dismissed no one else will see her. told her we are trying & will call when we hear back from this pain clinic.  message to pcp says she has pot & is going to try that.  fell off the couch & fell on her hip & knees. does not want appt. states when she was on morphine 3 times a day she could function.  Follow-up by: Golden Circle RN,  April 29, 2010 2:45 PM  Additional Follow-up for Phone Call Additional follow up Details #1::        Recommend not using pot as efficacy is not proven, she can make her own decision however.  She can come see me if she wants but she would need to see a pain clinic for daily morphine as I don't have experience with this. Additional Follow-up by: Rodney Langton MD,  April 29, 2010 4:25 PM    Additional Follow-up for Phone Call Additional follow up Details #2::    she had stated that she was going to try it. told her what md wrote & that we will call as soon as we hear back from the pain clinic Follow-up by: Golden Circle RN,  April 29, 2010 4:34 PM

## 2010-10-15 NOTE — Progress Notes (Signed)
Summary: pain clinic referral  Phone Note From Other Clinic   Caller: [name] at [facility] Summary of Call: States that pt records were reveiwed by the physician and based on his findings they would be glad to see her but will not be prescribing any narcotics (? drug offense in the 90's).  [name] needs to be told this before appoint is scheduled.   Initial call taken by: Dennison Nancy RN,  June 14, 2010 2:17 PM  Follow-up for Phone Call        Noted, please call pt to let her know someone will see her but no narcotics will be rx'ed and we will not be rxing narcotics either. Follow-up by: Rodney Langton MD,  June 16, 2010 11:48 AM  Additional Follow-up for Phone Call Additional follow up Details #1::        LVM for pt to call back Additional Follow-up by: Jimmy Footman, CMA,  June 17, 2010 9:54 AM    Additional Follow-up for Phone Call Additional follow up Details #2::    pt informed about above msg and she will be calling there to make appt. Follow-up by: De Nurse,  June 17, 2010 10:04 AM  Additional Follow-up for Phone Call Additional follow up Details #3:: Details for Additional Follow-up Action Taken: Pt calling back to talk to Nelson. Additional Follow-up by: Clydell Hakim,  June 17, 2010 10:22 AM   Spoke with [name]. I informed her of the above messages. [name] was not happy and then got an attitude when i explaind to her because of a ?drug charge in the 90's she will not be prescribed nor will we be prescribing any narcotics. [name] states that this was over 20 years ago and the this injury has happened a while after that charge. [name] then states that she has someone else she will go to and they will prescribe her the "narcotics she wants".Jimmy Footman, CMA  June 17, 2010 10:37 AM   Noted, to [place], ?Dr. shopping, I don't think she needs to be part of this practice. Rodney Langton MD  June 17, 2010 11:50 AM

## 2010-10-15 NOTE — Assessment & Plan Note (Signed)
Summary: f/up,tcb   Vital Signs:  Patient profile:   60 year old female Height:      67 inches Weight:      137 pounds BMI:     21.53 Temp:     98.5 degrees F oral Pulse rate:   86 / minute BP sitting:   138 / 83  (left arm) Cuff size:   regular  Vitals Entered By: Tessie Fass CMA (March 27, 2010 9:56 AM) CC: F/U Pain Assessment Patient in pain? yes     Location: head Intensity: 7   Primary Care Provider:  Rodney Langton MD  CC:  F/U.  History of Present Illness: Pt comes in to meet new MD.  Several complaints.  Neck pain:  S/p cervical discectomy in 2003 by Dr. Venetia Maxon, chronic pain since then, has been to pain clinics, on and off methadone and percocet.  Did PT initially but didn't continue.  Has exercises at home.  Saw Dr. Fritzi Mandes and had facet injection under flouroscopy that helped a lot.    Right rotator cuff:  Operated on by Dr. Turner Daniels in 2003.  Never did rehab.  Painful and difficulty with raising right arm.  Preventive medicine:  Colon:  Has scope appt for next month.  Mammo: due.  PAP:  s/p TAH/BSO.  Anxiety:  Has psychiatrist, Dr. Otelia Santee who rx'es her psychotropics.  Needs disability form filled out.  Habits & Providers  Alcohol-Tobacco-Diet     Tobacco Status: current     Tobacco Counseling: to quit use of tobacco products     Cigarette Packs/Day: 0.5     Neurosurgeon: Dr. Venetia Maxon, [facility]     Orthopedist: Dr. Turner Daniels, [facility]     Psychiatrist: Dr. Otelia Santee  Allergies: 1)  ! Penicillin V Potassium (Penicillin V Potassium) 2)  ! Flexeril (Cyclobenzaprine Hcl) 3)  ! Darvocet 4)  ! Hydrocodone  Past History:  Past Medical History: Depression--sees Dr. Otelia Santee ([facility]) who rx'es psychotropics GERD Osteoporosis, h/o L femur and L tibia fx 2000 Chronic cervical pain s/p fall, fx, diskectomies. Rotator Cuff syndrome, Right. Released by pain clinic for missing appts.  Past Surgical History: TAH/BSO: 1992 Hip fracture ORIF L cervical  discectomy and fracture repair 2003--Dr. Venetia Maxon (neurosurgeon) and Turner Daniels (ortho) Rotator cuff repair: Dr. Turner Daniels 2003. Breast Implants  Review of Systems       See HPI  Physical Exam  General:  Well-developed,well-nourished,in no acute distress; alert,appropriate and cooperative throughout examination Neck:  No TTP, no stepoffs, ROM limited to all directions. Lungs:  Normal respiratory effort, chest expands symmetrically. Lungs are clear to auscultation, no crackles or wheezes. Heart:  Normal rate and regular rhythm. S1 and S2 normal without gallop, murmur, click, rub or other extra sounds. Abdomen:  Bowel sounds positive,abdomen soft and non-tender without masses, organomegaly or hernias noted. Msk:  unable to abduct right arm 2/2 pain.  Positive Hawkins, Neer, empty can.  No deformity in supra/infraspinatus fossae or trapezius atrophy. Extremities:  Ambulatory.   Impression & Recommendations:  Problem # 1:  ROTATOR CUFF REPAIR, RIGHT, HX OF (ICD-V45.89) Assessment New She would probably benefit from injection, provided rehab handouts, will not rx narcotics for this.  Can consider Naproxen.  Orders: [facility]- Est  Level 4 (01027)  Problem # 2:  UNSPECIFIED BREAST SCREENING (ICD-V76.10) Assessment: New Mammo ordered.  Orders: [facility]- Est  Level 4 (99214) Mammogram (Screening) (Mammo)  Problem # 3:  SPECIAL SCREENING FOR MALIGNANT NEOPLASMS COLON (ICD-V76.51) Assessment: Unchanged Scheduled for colonoscopy.  Will follow.  Orders: [facility]- Est  Level 4 (24401)  Problem # 4:  HYPERLIPIDEMIA (ICD-272.4) Assessment: Unchanged Pt will come back for FLP/CMET.  Her updated medication list for this problem includes:    Simvastatin 20 Mg Tabs (Simvastatin) .Marland Kitchen... 1 tab by mouth daily  Orders: [facility]- Est  Level 4 (99214)Future Orders: Lipid-[facility] ([id number]) ... 03/28/2011 Comp Met-[facility] ([id number]) ... 03/28/2011  Problem # 5:  NECK PAIN, CHRONIC (ICD-723.1) Assessment: Unchanged She will  bring me her book on exercises and we will discuss rehab.  Again will not rx narcotics for this issue.  I think that her mood and her pain go hand-in-hand in this case.  Her updated medication list for this problem includes:    Aspirin 81 Mg Chew (Aspirin) .Marland Kitchen... 1 by mouth daily    Flexeril 10 Mg Tabs (Cyclobenzaprine hcl) .Marland Kitchen... 1 tab by mouth three times a day as needed spasm  Orders: [facility]- Est  Level 4 (38756)  Problem # 6:  DEPRESSION (ICD-311) Assessment: Unchanged Followed by psych.  Can obtain ROI and records at next visit.  We are not rx'ing psychotropics.  Her updated medication list for this problem includes:    Trazodone Hcl 100 Mg Tabs (Trazodone hcl) .Marland Kitchen... 2 tabs by mouth at bedtime; prescribed by psych    Wellbutrin Xl 300 Mg Xr24h-tab (Bupropion hcl) .Marland Kitchen... 1 tab by mouth daily; prescribed by psych    Lorazepam 1 Mg Tabs (Lorazepam) .Marland Kitchen... 1 tab by mouth three times a day as needed anxiety; prescribed by psych    Effexor Xr 75 Mg Xr24h-cap (Venlafaxine hcl) .Marland Kitchen... 2 tabs by mouth in am and 1 tab in pm; prescribed by psych  Complete Medication List: 1)  Fosamax 70 Mg Tabs (Alendronate sodium) .Marland KitchenMarland KitchenMarland Kitchen 1 weekly 2)  Aspirin 81 Mg Chew (Aspirin) .Marland Kitchen.. 1 by mouth daily 3)  Trazodone Hcl 100 Mg Tabs (Trazodone hcl) .... 2 tabs by mouth at bedtime; prescribed by psych 4)  Wellbutrin Xl 300 Mg Xr24h-tab (Bupropion hcl) .Marland Kitchen.. 1 tab by mouth daily; prescribed by psych 5)  Simvastatin 20 Mg Tabs (Simvastatin) .Marland Kitchen.. 1 tab by mouth daily 6)  Flexeril 10 Mg Tabs (Cyclobenzaprine hcl) .Marland Kitchen.. 1 tab by mouth three times a day as needed spasm 7)  Lorazepam 1 Mg Tabs (Lorazepam) .Marland Kitchen.. 1 tab by mouth three times a day as needed anxiety; prescribed by psych 8)  Restoril 30 Mg Caps (Temazepam) .Marland Kitchen.. 1 tab by mouth at bedtime 9)  Effexor Xr 75 Mg Xr24h-cap (Venlafaxine hcl) .... 2 tabs by mouth in am and 1 tab in pm; prescribed by psych 10)  Calcium-vitamin D 600-200 Mg-unit Tabs (Calcium-vitamin d) .Marland Kitchen.. 1 tab  by mouth two times a day 11)  Dulcolax 5 Mg Tbec (Bisacodyl) .... Day before procedure take 2 at 3pm and 2 at 8pm. 12)  Metoclopramide Hcl 10 Mg Tabs (Metoclopramide hcl) .... As per prep instructions. 13)  Miralax Powd (Polyethylene glycol 3350) .... As per prep  instructions.  Patient Instructions: 1)  Great to meet you today, 2)  We discussed your neck pain, rotator cuff, cholesterol, breast screening, and colon screening. 3)  Make a lab appt at the front to have your cholesterol checked. 4)  I want you to come back to see me in 2 weeks. 5)  Do the exercises. 6)  -Dr. Karie Schwalbe.         CMP ordered

## 2010-11-19 ENCOUNTER — Other Ambulatory Visit: Payer: Self-pay | Admitting: Orthopedic Surgery

## 2010-11-19 DIAGNOSIS — M545 Low back pain, unspecified: Secondary | ICD-10-CM

## 2010-11-19 DIAGNOSIS — R531 Weakness: Secondary | ICD-10-CM

## 2010-11-19 DIAGNOSIS — M25552 Pain in left hip: Secondary | ICD-10-CM

## 2010-11-26 ENCOUNTER — Ambulatory Visit (HOSPITAL_COMMUNITY)
Admission: RE | Admit: 2010-11-26 | Discharge: 2010-11-26 | Disposition: A | Discharge status: Home / Self Care | Payer: Medicare Other | Source: Ambulatory Visit | Attending: Orthopedic Surgery | Admitting: Orthopedic Surgery

## 2010-11-26 DIAGNOSIS — M545 Low back pain, unspecified: Secondary | ICD-10-CM

## 2010-11-26 DIAGNOSIS — R531 Weakness: Secondary | ICD-10-CM

## 2010-11-26 DIAGNOSIS — M5124 Other intervertebral disc displacement, thoracic region: Secondary | ICD-10-CM | POA: Insufficient documentation

## 2010-11-26 DIAGNOSIS — M25552 Pain in left hip: Secondary | ICD-10-CM

## 2010-12-02 ENCOUNTER — Ambulatory Visit
Admission: RE | Admit: 2010-12-02 | Discharge: 2010-12-02 | Disposition: A | Discharge status: Home / Self Care | Payer: Medicare Other | Source: Ambulatory Visit | Attending: Specialist | Admitting: Specialist

## 2010-12-02 ENCOUNTER — Other Ambulatory Visit: Payer: Self-pay | Admitting: Specialist

## 2010-12-02 DIAGNOSIS — IMO0002 Reserved for concepts with insufficient information to code with codable children: Secondary | ICD-10-CM

## 2010-12-03 LAB — POCT CARDIAC MARKERS: CKMB, poc: <1 ng/mL — ABNORMAL LOW (ref 1.0–8.0)

## 2010-12-03 LAB — BASIC METABOLIC PANEL
BUN: 20 mg/dL (ref 6–23)
CO2: 29 mEq/L (ref 19–32)
Chloride: 107 mEq/L (ref 96–112)
GFR calc non Af Amer: >60 mL/min (ref >60)
Glucose, Bld: 69 mg/dL — ABNORMAL LOW (ref 70–99)
Potassium: 3.6 mEq/L (ref 3.5–5.1)
Sodium: 140 mEq/L (ref 135–145)

## 2010-12-03 LAB — CARDIAC PANEL(CRET KIN+CKTOT+MB+TROPI)
CK, MB: 1.4 ng/mL (ref 0.3–4.0)
Relative Index: INVALID (ref 0.0–2.5)
Troponin I: 0.01 ng/mL (ref 0.00–0.06)
Troponin I: 0.03 ng/mL (ref 0.00–0.06)

## 2010-12-03 LAB — COMPREHENSIVE METABOLIC PANEL
AST: 30 U/L (ref 0–37)
CO2: 29 mEq/L (ref 19–32)
Calcium: 8.8 mg/dL (ref 8.4–10.5)
Chloride: 110 mEq/L (ref 96–112)
Creatinine, Ser: 0.93 mg/dL (ref 0.4–1.2)
GFR calc non Af Amer: >60 mL/min (ref >60)
Glucose, Bld: 118 mg/dL — ABNORMAL HIGH (ref 70–99)
Total Bilirubin: 0.4 mg/dL (ref 0.3–1.2)

## 2010-12-03 LAB — CK TOTAL AND CKMB (NOT AT ARMC)
CK, MB: 1.1 ng/mL (ref 0.3–4.0)
Total CK: 52 U/L (ref 7–177)

## 2010-12-03 LAB — CBC
HCT: 39.6 % (ref 36.0–46.0)
Hemoglobin: 13.6 g/dL (ref 12.0–15.0)
MCHC: 34.5 g/dL (ref 30.0–36.0)
MCV: 94.3 fL (ref 78.0–100.0)
Platelets: 258 10*3/uL (ref 150–400)
RDW: 13.3 % (ref 11.5–15.5)

## 2010-12-03 LAB — D-DIMER, QUANTITATIVE: D-Dimer, Quant: 0.27 ug/mL-FEU (ref 0.00–0.48)

## 2010-12-03 LAB — LIPID PANEL
HDL: 52 mg/dL (ref >39)
Triglycerides: 119 mg/dL (ref <150)
VLDL: 24 mg/dL (ref 0–40)

## 2010-12-03 LAB — TSH: TSH: 6.01 u[IU]/mL — ABNORMAL HIGH (ref 0.350–4.500)

## 2010-12-03 LAB — TROPONIN I: Troponin I: <0.01 ng/mL (ref 0.00–0.06)

## 2010-12-03 LAB — HEMOGLOBIN A1C: Hgb A1c MFr Bld: 5.3 % (ref <5.7)

## 2010-12-22 LAB — GRAM STAIN

## 2010-12-22 LAB — ANAEROBIC CULTURE

## 2010-12-22 LAB — BASIC METABOLIC PANEL
BUN: 10 mg/dL (ref 6–23)
CO2: 22 mEq/L (ref 19–32)
Chloride: 101 mEq/L (ref 96–112)
Glucose, Bld: 116 mg/dL — ABNORMAL HIGH (ref 70–99)
Potassium: 4.5 mEq/L (ref 3.5–5.1)
Sodium: 135 mEq/L (ref 135–145)

## 2010-12-22 LAB — WOUND CULTURE

## 2010-12-22 LAB — RAPID URINE DRUG SCREEN, HOSP PERFORMED
Barbiturates: NOT DETECTED
Opiates: POSITIVE — AB

## 2010-12-22 LAB — GLUCOSE, CAPILLARY

## 2010-12-31 ENCOUNTER — Other Ambulatory Visit (HOSPITAL_COMMUNITY): Payer: Medicare Other

## 2010-12-31 LAB — CSF CELL COUNT WITH DIFFERENTIAL
RBC Count, CSF: 1 /mm3 — ABNORMAL HIGH
WBC, CSF: 0 /mm3 (ref 0–5)

## 2010-12-31 LAB — DIFFERENTIAL
Eosinophils Absolute: 0.2 10*3/uL (ref 0.0–0.7)
Lymphs Abs: 2.3 10*3/uL (ref 0.7–4.0)
Monocytes Absolute: 0.5 10*3/uL (ref 0.1–1.0)
Monocytes Relative: 7 % (ref 3–12)
Neutrophils Relative %: 56 % (ref 43–77)

## 2010-12-31 LAB — BASIC METABOLIC PANEL
CO2: 23 mEq/L (ref 19–32)
Chloride: 108 mEq/L (ref 96–112)
Creatinine, Ser: 0.76 mg/dL (ref 0.4–1.2)
GFR calc Af Amer: >60 mL/min (ref >60)
Potassium: 4.2 mEq/L (ref 3.5–5.1)
Sodium: 139 mEq/L (ref 135–145)

## 2010-12-31 LAB — CSF CULTURE W GRAM STAIN
Culture: NO GROWTH
Gram Stain: NONE SEEN

## 2010-12-31 LAB — CBC
Hemoglobin: 14.1 g/dL (ref 12.0–15.0)
MCV: 91.3 fL (ref 78.0–100.0)
RBC: 4.72 MIL/uL (ref 3.87–5.11)
WBC: 6.9 10*3/uL (ref 4.0–10.5)

## 2010-12-31 LAB — PROTIME-INR: INR: 0.9 (ref 0.00–1.49)

## 2010-12-31 LAB — APTT: aPTT: 24 seconds (ref 24–37)

## 2011-01-03 ENCOUNTER — Other Ambulatory Visit (HOSPITAL_COMMUNITY): Payer: Medicare Other

## 2011-01-07 ENCOUNTER — Inpatient Hospital Stay (HOSPITAL_COMMUNITY): Payer: Medicare Other

## 2011-01-07 ENCOUNTER — Inpatient Hospital Stay (HOSPITAL_COMMUNITY)
Admission: RE | Admit: 2011-01-07 | Discharge: 2011-01-08 | DRG: 472 | Disposition: A | Discharge status: Home / Self Care | Payer: Medicare Other | Source: Ambulatory Visit | Attending: Specialist | Admitting: Specialist

## 2011-01-07 DIAGNOSIS — Z880 Allergy status to penicillin: Secondary | ICD-10-CM

## 2011-01-07 DIAGNOSIS — Y831 Surgical operation with implant of artificial internal device as the cause of abnormal reaction of the patient, or of later complication, without mention of misadventure at the time of the procedure: Secondary | ICD-10-CM | POA: Diagnosis present

## 2011-01-07 DIAGNOSIS — K219 Gastro-esophageal reflux disease without esophagitis: Secondary | ICD-10-CM | POA: Diagnosis present

## 2011-01-07 DIAGNOSIS — M4000 Postural kyphosis, site unspecified: Secondary | ICD-10-CM | POA: Diagnosis present

## 2011-01-07 DIAGNOSIS — Z8249 Family history of ischemic heart disease and other diseases of the circulatory system: Secondary | ICD-10-CM

## 2011-01-07 DIAGNOSIS — T8489XA Other specified complication of internal orthopedic prosthetic devices, implants and grafts, initial encounter: Secondary | ICD-10-CM | POA: Diagnosis present

## 2011-01-07 DIAGNOSIS — Z472 Encounter for removal of internal fixation device: Secondary | ICD-10-CM

## 2011-01-07 DIAGNOSIS — M47812 Spondylosis without myelopathy or radiculopathy, cervical region: Principal | ICD-10-CM | POA: Diagnosis present

## 2011-01-07 DIAGNOSIS — F17200 Nicotine dependence, unspecified, uncomplicated: Secondary | ICD-10-CM | POA: Diagnosis present

## 2011-01-07 DIAGNOSIS — Y92009 Unspecified place in unspecified non-institutional (private) residence as the place of occurrence of the external cause: Secondary | ICD-10-CM

## 2011-01-07 DIAGNOSIS — F411 Generalized anxiety disorder: Secondary | ICD-10-CM | POA: Diagnosis present

## 2011-01-07 LAB — COMPREHENSIVE METABOLIC PANEL
AST: 24 U/L (ref 0–37)
Albumin: 4 g/dL (ref 3.5–5.2)
Calcium: 10 mg/dL (ref 8.4–10.5)
Creatinine, Ser: 0.86 mg/dL (ref 0.4–1.2)
GFR calc Af Amer: >60 mL/min (ref >60)

## 2011-01-07 LAB — DIFFERENTIAL
Eosinophils Absolute: 0.2 10*3/uL (ref 0.0–0.7)
Lymphs Abs: 1.5 10*3/uL (ref 0.7–4.0)
Monocytes Absolute: 0.5 10*3/uL (ref 0.1–1.0)
Monocytes Relative: 11 % (ref 3–12)
Neutrophils Relative %: 48 % (ref 43–77)

## 2011-01-07 LAB — URINE MICROSCOPIC-ADD ON

## 2011-01-07 LAB — CBC
MCH: 30.8 pg (ref 26.0–34.0)
MCV: 90.3 fL (ref 78.0–100.0)
Platelets: 216 10*3/uL (ref 150–400)
RBC: 4.13 MIL/uL (ref 3.87–5.11)

## 2011-01-07 LAB — URINALYSIS, ROUTINE W REFLEX MICROSCOPIC
Glucose, UA: NEGATIVE mg/dL
Hgb urine dipstick: NEGATIVE
Protein, ur: NEGATIVE mg/dL
Specific Gravity, Urine: 1.014 (ref 1.005–1.030)

## 2011-01-28 NOTE — Assessment & Plan Note (Signed)
HISTORY OF PRESENT ILLNESS:  A 60 year old female with weakness in the  right shoulder and pain along the medial aspect of the forearms.  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  She did have an EMG  study on May 05, 2008, which showed no evidence of nerve damage,  basically looked normal.  I would like to explain to the patient that  she could still have intermittent radicular irritation related to neck  positioning, but none to cause external degeneration or denervation.   Pain is about a 6/10.  She is doing much better on the MS Contin 15 mg  b.i.d.  There are some days where she feels like she may have to take a  t.i.d., but this maybe a couple times a week.  We went over her narcotic  analgesics and how which she is on 25 of that a day.  She has more  likely to developed tolerance and if she has a washout.   REVIEW OF SYSTEMS:  Positive for numbness and tingling in the hands as  noted above, depression, and anxiety, but negative for suicidal  thoughts.  She does have some constipation.   We talked about exercise.  She would like to get into some yoga.   SOCIAL HISTORY:  Lives alone.  Divorced.  Has a dog.   PHYSICAL EXAMINATION:  VITAL SIGNS:  Blood pressure 149/83, pulse 87,  respiratory rate 18, and O2 sat 99% on room air.  GENERAL:  No acute distress.  Orientation x3.  Affect is alert.  Gait is  normal.  NECK:  Has 75% range forward flexion, extension, lateral rotation, and  bending.  She has pain and numbness with foraminal compression and with  hypertension in neck.  She has negative Tinel's at the elbow.  She has  4/5 grip on the right, 3- at the deltoid, limited by pain, and some  limitation range of motion at the shoulder joint.  Good biceps and  triceps.  On the left side, 5/5 strength in the upper extremity  and the  lower extremity is 5/5 bilaterally.  Sensation is reduced in the right  L5-S1 dermatome in addition to the index finger.   Hip internal and external rotation is normal.  Knee and ankle range of  motion normal.   IMPRESSION:  1. Cervical postlaminectomy syndrome.  I believe she had some      foraminal stenosis with functional stenosis in that she does get      some radicular discomfort with hyperextension in the neck.  She      also has some axial neck pain.  She does not have any evidence of      ulnar neuropathy.  2. Right hand numbness.  I believe this is more likely to be a carpal      tunnel syndrome.  Wrist splinting would be helpful.   PLAN:  1. Continue MS IR 15 mg b.i.d. with occasional t.i.d., given her #70      for one-month supply.  2. Consider cervical facet injections if her headache pain or neck      pain increase in the future.  Overall, doing quite well.  I will      see her back in about a month.      Erick Colace, M.D.  Electronically  Signed     AEK/MedQ  D:  06/01/2008 12:47:48  T:  06/02/2008 01:18:29  Job #:  [id number]   cc:   [name], M.D.  Fax: [phone]

## 2011-01-28 NOTE — Assessment & Plan Note (Signed)
A 60 year old female with weakness in the right shoulder and pain along  the medial aspect of both forearms.  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  In  the interval time she has had a fall.  She hurt her left hip, went the  ED at [facility] and took a couple of extra MS Contin because  of that.  She has had x-rays in the ED.  She does not have the results.  I reviewed the films as well as the reports.  They showed no evidence of  fractures.  She has 2 screws from the prior hip fracture which appears  to be subcapital on the left and she has had intramedullary rod on the  right for femur fracture in the past.   She has had MRIs of the shoulder in the past showing rotator cuff tear.  She has had rotator cuff surgery x2 on the right shoulder in the past as  well.   She sees Dr. Arville Care from Neurosurgery.  She has a history of cervical  post-laminectomy syndrome.   PHYSICAL EXAMINATION:  GENERAL:  In no acute distress.  Mood and affect  appropriate.  Pain score is 4/10 currently.  FUNCTIONAL STATUS:  Thirty minute walking time.  OSWESTRY DISABILITY INDEX:  36% which has actually improved over prior.  MUSCULOSKELETAL:  5/5 strength in bilateral upper and lower extremities.  She has good hip internal and external rotation.  No pain over the  greater trochanters bilaterally.  No pain over the hips or in the low  back area.   IMPRESSION:  1. Cervical post-laminectomy syndrome.  2. Chronic hip pain.  Although she has some exacerbation following a      fall it appears to be soft tissue.  No evidence of hardware failure      on x-ray.   PLAN:  1. We will continue current medications MSIR 15 mg b.i.d. and      occasionally t.i.d. 70 tablets for 1 month supply.  2. We will monitor neck pain.  If she has some increase in her pain      overtime may need some cervical facet injections but none currently       needed.  3. Given some soft tissue muscle spasm, we will give her samples of      Amrix 15 mg nightly x2 weeks.      Erick Colace, M.D.  Electronically Signed     AEK/MedQ  D:  07/25/2008 11:44:29  T:  07/26/2008 00:38:24  Job #:  [id number]   cc:   [name], M.D.  Fax: [phone]   Danae Orleans. Venetia Maxon, M.D.  Fax: [phone]

## 2011-01-28 NOTE — Procedures (Signed)
[name], [name] NO.:  [id number]   MEDICAL RECORD NO.:  [id number]           PATIENT TYPE:   LOCATION:                                 FACILITY:   PHYSICIAN:  Celene Kras, MD        DATE OF BIRTH:  Apr 22, 1951   DATE OF PROCEDURE:  DATE OF DISCHARGE:                               OPERATIVE REPORT   [name]  I evaluated her  and reviewed the health and history form and 14-point review of systems.   1. [name] is an individual complaining of pain in the neck, difficulty      with endurance, and range of motion activity, functional      impairment, particularly with most of her standard activities of      daily living.  We want to minimize escalation of controlled      substances but improve function and quality of life with primary      pain to the right.  Added biomechanical stress above and below      surgical fixation site.  I have reviewed the films, reports, and      have discussed options with her.  I think it is probably reasonable      we go on to cervical facet intervention, most problematic side to      the right side, as RF could be an option.  I recommend 5, 6, and 7,      with added biomechanical stress above, at 3 and 4, right side,      under local anesthetic at the medial branch.  I have given her      benchmarks, discussed in lay terms, no barrier to communication.  2. Modifiable features in health profile such as weight control or      such as cigarette cessation strongly urged.  3. Maintain contact with her psychiatric care Grisell Bissette.   Objectively, diffuse paracervical myofascial discomfort, positive  cervical facet compression test particularly right greater than left, on  the left side with suprascapular and levator scapular pain, pain with  extension and side bending.  Slightly decreased grip strength, right  greater than left but nothing new neurologically.   IMPRESSION:  Degenerative  spine disease, cervical spondylosis.   PLAN:  Cervical facet medial branch intervention 3, 4, 5, 6, and 7 with  contributory innervation addressed.  To be under local anesthetic,  follow up with considerations of long-acting local anesthetic by  protocol, and I have reviewed with her.   She has consented for today's procedure.   The patient was taken to Fluoroscopy and placed in supine position.  Neck prepped and draped in the usual fashion.  Using a 25-gauge spinal  needle, I advanced through cervical facet of the medial branch under  direct fluoroscopic observation to C3, C4, C5, C6, and C7 independent  needle axis points at the medial branch and reconfirmed.  I then  injected 0.5 mL of lidocaine 1% MPF at each level with a total of 40 mg  Aristocort in divided dose.  Tolerated the procedure well.  No complications from our procedure.  Appropriate recovery.  Discharge instructions given.           ______________________________  Celene Kras, MD     HH/MEDQ  D:  12/05/2008 14:51:39  T:  12/06/2008 03:03:56  Job:  [id number]

## 2011-01-28 NOTE — Assessment & Plan Note (Signed)
This is a 60 year old female with weakness right shoulder and pain along  the medial aspect of both forearms.  NCV done on April 10, 2008 showed  mild-to-moderate right median neuropathy at the wrist.   In the interval time, she has had a dog bite right wrist, no infection.  This is healing, still has some swelling at the wrist.   She took a few extra MS Contin because of that.   She also started with some yoga, exercises, difficulty with certain  moves, but overall happy that she is doing it.   Her pain score is 5/10.  Pain is described as sharp, intermittent,  stabbing, tingling; interferes with activity at a 2/10; sleep is fair;  and walking tolerance is 30-45 minutes.   Oswestry disability index is 28%.  Oswestry score from previous was 48%.  This is a significant improvement.   CURRENT MEDICATIONS:  MSIR 15 mg b.i.d., occasionally t.i.d., #70 for  one-month supply.   SOCIAL HISTORY:  Divorced.  Lives with her dog.   PAST SURGICAL HISTORY:  Right femur fracture, cervical surgery 2003,  rotator cuff surgery 2003 on the right side.   PHYSICAL EXAMINATION:  VITAL SIGNS:  Blood pressure 130/83, pulse 89,  respiratory rate 18, and O2 sat 99% in room air.  GENERAL:  No acute distress.  Orientation x3.  Affect alert.  Gait is  normal.  EXTREMITIES:  Without edema.  She has good range of motion in bilateral  hips, knees, and ankles.  Strength 4/5 right upper extremity, 5/5 left  upper extremity, and 5/5 bilateral lower extremities.  Sensation is  normal.  Deep tendon reflexes normal upper and lower extremities.  Right  wrist does have some swelling in it.  In the dorsum of he wrist, some  mild ecchymosis, no evidence of infection.   IMPRESSION:  1. Cervical post-laminectomy syndrome.  2. Right hand numbness, likely carpal tunnel syndrome.  3. Right shoulder pain, chronic postoperative pain.   PLAN:  Continue MSIR 15 mg b.i.d. sometimes t.i.d., #70 for one-month  supply.  I will  see her in 1 month.  Monitor disability scores.  If neck  pain increases, consider cervical facet injections.      Erick Colace, M.D.  Electronically Signed     AEK/MedQ  D:  06/27/2008 12:36:55  T:  06/28/2008 03:16:07  Job #:  [id number]   cc:   [name], M.D.  Fax: [phone]

## 2011-01-28 NOTE — Assessment & Plan Note (Signed)
A 60 year old female with weakness in the right shoulder and pain along  the medial aspect of both forearms.  EMG/NCV April 12, 2008, performed by  myself showed mild-to-moderate median neuropathy at the wrist.   She has had no new problems since I last saw her on July 25, 2008.  She is actually doing pretty well.  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  She is now working as an Charity fundraiser.   She has some stabbing pain in the right scapula and it is intermittent  in nature.  In fact, that has improved for the last few days.  Her neck  pain and her upper extremity pain are about the same.  Her average pain  is 4/10, but really does not interfere with activity.  Her pain is worse  with bending and standing, improves with rest, exercise, and medication.  She can walk 30-45 minutes at a time.  She climbs one step at a time.  However, she does have a history of hip fracture and ORIF of the hip in  the past.  She has had some weight loss, depression, anxiety, but no  suicidal thoughts.   She has had no signs of aberrant drug behavior.  She is divorced and  lives with her dog, smokes a half pack per day.   Blood pressure is 115/57, pulse 84, respirations 20, and oxygen  saturation 95% on room air.   Her strength is 5/5 in the left upper extremity in deltoid, biceps,  triceps, grip; on the right side, it is 4/5 in deltoid, biceps, triceps,  grip.  There is no evidence of multiple atrophy.  Her neck range of  motion is 75% forward flexion, extension, rotation, bending.  She was  nontender to palpation along the cervical or thoracic paraspinals or  periscapular muscles.   IMPRESSION:  1. Cervical post laminectomy syndrome.  2. History of recent periscapular strain.  This has resolved, however.   I will see him back in two months, will have nursing visit in one month.  We will continue current medications, which are basically  morphine  sulfate immediate release 15 mg #70 one p.o. b.i.d. or t.i.d., one month  supply #70.      Erick Colace, M.D.  Electronically Signed     AEK/MedQ  D:  08/21/2008 15:19:49  T:  08/22/2008 09:32:33  Job #:  [id number]

## 2011-01-28 NOTE — Assessment & Plan Note (Signed)
[name] returns today.  She is quite pleased after her cervical medial  branch block, which was performed December 04, 2008, while she had no  immediate pain relief since that time, she has been able to reduce her  MS Contin from t.i.d. to b.i.d.  She is quite pleased with this.  She of  course continues to have similar paresthesias in C8 dermatome, which are  explained by C7/T1 foraminal stenosis, but her neck pain is better and  she could sleep better as result of this.  Pain has really not  interfered with activity.  Her average pain is still about 5.  She can  walk 1 hour at a time.  She climbs steps.  She drives.  She has had some  problems with her left knee.  She has some difficulty with certain  household duties.  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  She had some bladder problems, tingling, anxiety as well.  She does see Dr. Duanne Guess from [facility].   PHYSICAL EXAMINATION:  VITAL SIGNS:  Blood pressure 141/70, pulse 83,  respiratory rate 16, O2 sat 96% on room air.  GENERAL:  No acute distress.  Orientation x3.  Affect is bright.  She is  normal.   Her Oswestry score is only 16% today, which is her low since she started  coming here.   Her neck has mild tenderness on the lateral masses on the right side.  Her upper extremity strength is normal.  She has decreased sensation  about the little finger bilaterally compared to the other digits.   She has no intrinsic atrophy.   IMPRESSION:  1. Cervical spondylosis with facet syndrome.  2. Chronic C8 radiculitis.   PLAN:  1. We will send her to Dr. Stevphen Rochester for repeat injection.  She does feel      like it is wearing off.  2. We will continue MS Contin, but next visit we will reduce it to      b.i.d. if she continues to be well as she is now.  3. We will give her Valium 10 mg prior to her next injection.      Erick Colace, M.D.  Electronically Signed     AEK/MedQ  D:  12/18/2008  17:33:55  T:  12/19/2008 05:04:46  Job #:  [id number]   cc:   Celene Kras, MD  Fax: [phone]

## 2011-01-28 NOTE — H&P (Signed)
NAMESAGRARIO, [name] NO.:  [id number]   MEDICAL RECORD NO.:  [id number]          PATIENT TYPE:  INP   LOCATION:  1514                         FACILITY:  [facility]   PHYSICIAN:  Vinnie Level, MD    DATE OF BIRTH:  [birth year]   DATE OF ADMISSION:  03/01/2008  DATE OF DISCHARGE:                              HISTORY & PHYSICAL   PRIMARY CARE PHYSICIAN:  Georgina Quint. Plotnikov, M.D. to [name],  M.D., most recently Dr. Duanne Guess.   CHIEF COMPLAINT:  GI bleed.   HISTORY OF PRESENT ILLNESS:  The patient is a 60 year old female with a  history of depression, anxiety, GERD, and COPD, who presents now with  complaint of GI bleeding.  The patient reports that she has been out of  her pain medications for approximately 5 weeks and sometime late last  week, approximately Friday began taking 3 Naproxen per day as well as a  history of anywhere between 12-16 aspirin per day.  She does report that  she is a former Engineer, civil (consulting) and was unaware that this amount of Tylenol is  potentially harmful.  She does note that she had onset of bloody streaks  in her stools which has continued through the weekend.  Approximately  Thursday or Friday of last week, she began having nausea.  She notes  that she had approximately 3 episodes of emesis with significant dry  heaving through the weekend.  She says this all started when she called  for Flexeril from Dr. Chauncy Passy office on Friday and was denied.  She  began taking her friend's Naproxen.  She notes that she has considerable  pain in her back, in her hip, and in her right shoulder.  In the past  she said that she called EMS for help with this pain.  There was an  admission from earlier in June when she was out of medication as well.  Present here she is a transfer from [facility] where she was seen in the  emergency department, now she notes mild gastric pain here on arrival to  the floor at [facility].  Now, she is  requesting  pain medication from the nurse, specifically Dilaudid.   PAST MEDICAL HISTORY:  1. Depression.  2. Osteoporosis.  3. Anxiety.  4. GERD.  5. COPD.  6. Degenerative joint disease.   PAST SURGICAL HISTORY:  She had a left hip open reduction and internal  fixation with metal rod placed in 2000.  She has had status post rotator  cuff surgery in the past as well.   MEDICATIONS:  1. Restoril 30 mg p.o. nightly.  2. Fosamax 17 grams p.o. q.week.  3. Effexor XL 300 mg daily.  4. Syntest DS.  5. Calcium plus D 600 mg b.i.d.  6. Multivitamin daily.  7. Lipoflavonoid.  8. She started Azo-Standard today.  9. Aspirin and Naproxen.   Of note, she was on methadone until about a year ago and then was  recently on morphine p.o. which has recently not been prescribed by  other primary care physicians.  She  notes since Dr. Posey Rea she has  seen three PCPs in the area and can find no one that takes Medicaid and  is willing to see her.   ALLERGIES:  DARVOCET which causes a rash and PENICILLIN which causes a  rash.   SOCIAL HISTORY:  She acknowledges about approximately 1/2 pack per day.  No alcohol and no drug use.  She is a former Charity fundraiser.   FAMILY HISTORY:  Alcoholism on both sides of the family.  She denies  drinking for the last 14 years.   REVIEW OF SYSTEMS:  As per HPI, otherwise all systems reviewed and  negative.  See HPI noted above.  Notable positives include recent hot  flashes, chronic constipation, emesis as noted above, dry heaving, a  vague chest pain which she relates as pain from her neck and back, and a  vague dysuria which she notes started today.  She complains of hematuria  as well.   OBJECTIVE:  VITAL SIGNS:  Temperature 98.4, pulse 79, blood pressure  155/90, respiratory rate 18, and oxygen saturation is 95% on room air.  GENERAL:  No acute distress.  HEENT:  Normocephalic and atraumatic.  No bloody gums.  Conjunctivae  clear.  Pupils equal, round, and  reactive to light and accommodation.  Extraocular muscles intact.  NECK:  She has a right EJ in place.  No lymphadenopathy.  Trachea  appears midline.  CARDIOVASCULAR:  Regular rate and rhythm with no murmurs, rubs, or  gallops.  CHEST:  Clear to auscultation bilaterally.  ABDOMEN:  Mild epigastric tenderness.  Normal active bowel sounds.  Soft.  No masses are appreciated.  EXTREMITIES:  Thin.  No cyanosis, clubbing, or edema.  SKIN:  Without rash.  No ecchymosis.  Reportedly hemoccult positive in  the emergency department at [facility].   LABORATORY DATA:  White count is 9.3, hemoglobin is 12.6, platelet count  289, 55% segs, 34% lymphocytes, 7% monocytes, and 4% eosinophils.  BMP  reveals sodium 141, potassium 3.6, chloride 109, bicarb 25, BUN 11,  creatinine 0.8, glucose 87.  AST 13, ALT 11, alkaline phosphatase 48,  total bilirubin 0.6, albumin 3.6, INR 0.9, PT 12.6, PTT 27.  Salicylate  level was normal at 9.8.  Acetaminophen level was less than 10.  Urine  specific gravity 1.014, 11-20 white cells, rare bacteria, nitrite  positive, leukocyte esterase small.   EKG reveals normal sinus rhythm at 74.  No ST or T wave changes.   Chest x-ray reveals no acute cardiopulmonary disease, decreased volumes,  previously noted pulmonary nodules not appreciated now.   ASSESSMENT:  A 60 year old white female with history of gastrointestinal  bleed in the setting of significant doses of aspirin and Naproxen,  chronic pain medication use, depression and anxiety.  1. Admit to [facility] floor.  No telemetry bed needed.  2. Gastrointestinal bleed likely secondary to increased aspirin.  She      could have a small Mallory-Weiss tear, though likely this is an      upper gastrointestinal bleed is low.  She may have a small lower      gastrointestinal bleed exacerbated by the aspirin.  Briefly      discussed with Anselmo Rod, M.D. of GI here tonight on the      floor.  Full  consult in the morning.  There is low risk that this      is a large bleed.  I do not believe that she needs acute scope  at      the moment.  3. Pain.  Vicodin p.o. with morphine for breakthrough pain.  Wean IV      as soon as possible.  4. Urinary tract infection.  Cipro 250 mg p.o. b.i.d.  5. Aspirin does not appear to be in the overdose range.  It is not      even in therapeutic range at the present time.  She has no evidence      of acid-based disturbance.  6. Prophylaxis.  SCDs and b.i.d. proton pump inhibitor.  7. Fluid, electrolyte, and nutrition.  Normal saline at 75 clears.  8. Psychiatric.  Continue Effexor and Restoril.  9. Disposition.  Follow serial H&H, discuss with GI in the morning,      and likely discharge to home once her H&H is stable and there is no      need for an acute scope.      Vinnie Level, MD  Electronically Signed     PMB/MEDQ  D:  03/02/2008  T:  03/02/2008  Job:  [id number]

## 2011-01-28 NOTE — Assessment & Plan Note (Signed)
[facility]air[facility].  I evaluated  her.  I have reviewed the Health and History Form.  I reviewed the 105-  point review of systems.   1. Exceptional result from a cervical facet intervention with      benchmarks reach.  She is here for second intervention, sequential      by protocol, dense longer acting local anesthetic  2. To hold off as she provide [place] with a urine sample and it is pretty      clear.  She had a UTI.  I will send now for UA and C and S, and she      does not have primary care, I am going to ask her to establish with      primary care, but I will treat her this one-time with Cipro, and      she will transfer primary care concerns to __________.  3. She does not having any  findings from a neurological perspective,      stable here.  Spondylitic pain, elements of facet syndrome.      Nothing new neurologically.   OBJECTIVE:  Diffuse paracervical myofascial discomfort, positive  cervical pseudo compression test left greater than right pain.  Pain  with extension, side bending, suprascapular, levator scapulae, pain  particularly to the right.  Nothing new neurologically.   IMPRESSION:  Spondylosis, degenerative spine disease of the cervical  spine.   PLAN:  Cervical facet medial branch intervention and follow up.  She  will establish with primary care.           ______________________________  Celene Kras, MD     HH/MedQ  D:  01/16/2009 10:18:28  T:  01/17/2009 00:08:08  Job #:  [id number]
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
## 2011-01-28 NOTE — Consult Note (Signed)
[name], [name] NO.:  [id number]   MEDICAL RECORD NO.:  [id number]          PATIENT TYPE:  EMS   LOCATION:  ED                           FACILITY:  [facility]   PHYSICIAN:  Annette Done, MD  DATE OF BIRTH:  16-Jan-1951   DATE OF CONSULTATION:  04/04/2009  DATE OF DISCHARGE:                                 CONSULTATION   [name] comes in today after she left AMA last night for a low left dog  bite and infection.  The patient is seen and examined in the emergency  department.  Her drain was discontinued.  Her wound look good.  Wound  was re-dressed sterilely.  The patient tolerated application of the  dressing and splint.  I plan to see her back in 4 days for repeat wound  check and evaluation.  She is to keep the splint clean and dry.  She is  going to be discharged on clindamycin, oral antibiotics.  Prior to  discharge, all questions were answered and encouraged.  No other  medications were given.  The patient voiced understanding of plan and  the strict adherence to the postoperative instructions.      Annette Done, MD  Electronically Signed     FWO/MEDQ  D:  04/04/2009  T:  04/04/2009  Job:  [id number]
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
## 2011-01-28 NOTE — Discharge Summary (Signed)
[name], [name] NO.:  [id number]   MEDICAL RECORD NO.:  [id number]          PATIENT TYPE:  INP   LOCATION:  1507                         [facility]AC[facility]   PHYSICIAN:  Lonia Blood, M.D.      DATE OF BIRTH:  September 03, 1951   DATE OF ADMISSION:  03/01/2008  DATE OF DISCHARGE:  03/05/2008                               DISCHARGE SUMMARY   PRIMARY CARE PHYSICIAN:  [name], MD.   DISCHARGE DIAGNOSES:  1. Gastrointestinal bleed, mainly rectum.  2. Mild anemia from gastrointestinal bleed with hemoglobin of 11.9.  3. Acute urinary tract infection.  4. Possible unintentional drug overdose.  5. Chronic pain.  6. Gastroesophageal reflux disease.  7. Chronic obstructive pulmonary disease.   DISCHARGE MEDICATIONS:  1. Restoril 30 mg nightly.  2. Fosamax 70 mg on Saturdays.  3. Effexor XL 300 mg daily.  4. Syntest DS 1 tablet daily.  5. Calcium Plus D 600 mg twice a day.  6. Multivitamin daily.  7. Lipoflavonoid tablet, 1 tablet daily.  8. Ibuprofen 800 mg t.i.d.   DISPOSITION:  The patient was discharged to follow up with Dr. Duanne Guess and  also pain clinic for her pain medicine needs.  She had no bleed in the  hospital so far.  Her hemoglobin remained perfectly stable during  hospitalization.   PROCEDURES PERFORMED THIS ADMISSION:  A chest x-ray on June 17 that  showed lower lung volumes on prior exam with left basilar atelectasis,  pulmonary nodule which was also seen on prior exam, recommended follow-  up CT scan, but no acute cardiopulmonary disease.   CONSULTATIONS:  None.   BRIEF HISTORY AND PHYSICAL:  Please refer to dictated history and  physical on admission by Dr. Karren Burly.  In short, however patient  is a 60 year old female with multiple medical problems including  anxiety, GERD and COPD.  She came to the ER complaining of GI bleed.  She has been out of her pain medicine for awhile and has been taking  naproxen at higher dose.  She also  takes somewhere between 12-16 aspirin  a day.  On arrival in the ED, the patient was found to be stable, all  vital signs stable.  Her hemoglobin was 12.6.  She was subsequently  admitted for observation and further GI workup.   HOSPITAL COURSE:  1. GI bleed.  The patient had no other GI bleed in the hospital.      However, she was asking for continued medications.  Her hemoglobin      dropped slightly from 12.6 to 11.9, otherwise no further drop was      noted and no further bleed.  It was presumed that if she bled it      must have been coming from intake of high-dose of NSAIDs.  Patient      was cautioned about that.  2. Chronic pain.  The patient complained of chronic low back pain.      She has been on narcotics prior to her being with Dr. Duanne Guess.  She      has been  scheduled to follow in the pain clinic.  We have,      therefore, not given her any narcotics at this point.  She will      follow up post discharge in the pain clinic.  3. Bacteruria.  Possibility of UTI, with WBC of 7-10 and positive      leukocyte esterase.  She was empirically treated 3 days with Cipro.  4. GERD.  We gave her PPIs in the hospital.  5. COPD.  This has been stable, there was no exacerbation.   Otherwise, further follow-up will be with Dr. Duanne Guess.      Lonia Blood, M.D.  Electronically Signed     LG/MEDQ  D:  04/23/2008  T:  04/23/2008  Job:  [id number]

## 2011-01-28 NOTE — Assessment & Plan Note (Signed)
This is a 60 year old female with a work-related injury in 2003  sustaining of a right rotator cuff injury as well as cervical  radiculopathy.  She has undergone C5 through C7 ACDF per Dr. Gabriel Rung D.  Venetia Maxon.  She has seen Dr. Vear Clock for pain management in the past and  has also been to her primary care doctor in the past.  She in last visit  had symptoms of double vision and was seen in the ED, sent over from our  office and had further evaluation including CT of the head, which was  negative for any acute process.  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  She continues to see her psychiatrist,  Dr. Maralyn Sago in [facility].  Her last visit had recently discontinued her  psychiatric medications including Wellbutrin and Effexor as well as  Ativan.  She has continued on her current psychiatric medicines of  Elavil 150 mg nightly, trazodone 300 mg nightly and is waiting to  restart her Effexor.   She complains that her neck pain is increasing and is wondering whether  she needs more surgery.  I did review the ER note, which showed normal  CBC, normal protime, normal BMET, and normal CT of head.  She had a  lumbar puncture done as well.   PHYSICAL EXAMINATION:  VITAL SIGNS:  Blood pressure 139/79, pulse is 74,  respirations 18, and O2 sat 98% on room air.  GENERAL:  Well-developed and well-nourished female.  She is emotionally  labile.  Her gait shows no evidence of toe drag or knee instability.  Affect is alert.  Her mental status is alert.  Her orientation x3.  EXTREMITIES:  Coordination is normal in the upper and lower extremities.  Deep tendon reflexes are normal in the upper and lower extremities.  Sensation is mildly reduced in the second and third digit on the right  hand only.   Her motor strength is 5/5 bilateral  deltoid, biceps, triceps, grip as  well as hip flexion, knee extension, and ankle dorsiflexion.  She does  have some pain related reduction in full effort on motor testing on the  right side only at the deltoid.   Her neck has tenderness even to mild touch, but this is mainly in the  upper trapezius area.   Her neck range of motion is 50% range forward flexion, extension, and  lateral rotation and bending.   IMPRESSION:  1. Cervical post laminectomy syndrome.  She is status post C5-6 and C6-      7 fusion.  I anticipate that she may have some cervical facet      syndrome, post laminectomy-type syndrome.  She had some upper      extremity paresthesias, but this has been evaluated in EMG.  No      signs of nerve root impingement.   We will go ahead and send her to Dr. Celene Kras to see if he feels that  any cervical medial branch blocks would be in order.  1. Family notes may send to Dr. Venetia Maxon for reevaluation.  We need to      repeat a cervical spine MRI.      Erick Colace, M.D.  Electronically Signed     AEK/MedQ  D:  11/14/2008 15:12:54  T:  11/15/2008 04:44:27  Job #:  [id number]   cc:   Danae Orleans. Venetia Maxon, M.D.  Fax: [phone]   [name], M.D.  Fax: [phone]

## 2011-01-28 NOTE — Op Note (Signed)
NAMEBETHANEY, OSHANA NO.:  [id number]   MEDICAL RECORD NO.:  [id number]          PATIENT TYPE:  INP   LOCATION:  1533                         [facility]AC[facility]   PHYSICIAN:  Madelynn Done, MD  DATE OF BIRTH:  03/25/1951   DATE OF PROCEDURE:  04/01/2009  DATE OF DISCHARGE:                               OPERATIVE REPORT   PREOPERATIVE DIAGNOSIS:  Left hand dog bite with underlying infection  and draining wound.   POSTOPERATIVE DIAGNOSIS:  Left hand dog bite with underlying infection  and draining wound.   ATTENDING SURGEON:  Dr. Bradly Bienenstock, who was scrubbed and present for  the entire procedure.   ASSISTANT SURGEON:  None.   SURGICAL PROCEDURES:  Left hand incision and drainage of a dog bite and  the potential infection and early abscess formation.   INTRAOPERATIVE FINDINGS:  The patient did have a 4-cm transverse  laceration just proximal to the wrist crease with serosanguineous fluid  draining from it with ascending and descending erythema and cellulitis  suggestive of an early infection from an animal bite.   ANESTHESIA:  General via endotracheal tube.   INTRAOPERATIVE CULTURES:  Aerobic, anaerobic cultures sent for Gram  stain and culture.   SURGICAL INDICATIONS:  Ms. Grumbine is a 60 year old right-hand-dominant  female who sustained a dog bite to her left hand yesterday.  The patient  represented to the ER today with worsening pain, erythema and drainage  from her wound.  She was seen and evaluated in the emergency department  and recommended to undergo the above procedure.  Risks, benefits and  alternatives were  discussed in detail with the patient and signed  informed consent was obtained.  Risks include but not limited to  bleeding, infection, damage to nearby nerves, arteries or tendons,  persistent infection, loss of motion of digits and need for further  surgical intervention.   DESCRIPTION OF PROCEDURE:  The patient was identified in  the preop  holding area and a mark with a permanent marker made on the left hand to  indicate correct operative site.  The patient brought back to the  operating room, placed supine on the anesthesia room table where general  anesthesia was administered.  The patient received preoperative  clindamycin secondary to penicillin allergy.  The patient was then  brought back to the operating room, placed supine on the anesthesia  table.  General anesthesia was administered.  The patient tolerated this  well.  A well-padded tourniquet was then placed on the left brachium and  sealed with 1000 drape.  Left upper extremity were prepped with Betadine  and sterilely draped.  Time-out was called.  The correct side was  identified and the procedure was then begun.  Attention was then turned  to the left hand where the patient's 4-cm transverse laceration was  extended in longitudinal fashion proximally and distally.  The area of  the zone of entry extended all the way down to the retinaculum and went  through the retinaculum to the third dorsal compartment.  Deep  dissection was then carried out.  The debridement of skin, subcutaneous  tissue was then carried out deeply.  Following circumferential  dissection around the area and zone of injury, thorough irrigation with  6 liters of saline solution was then irrigated throughout.  1 liter of  antibiotic solution was also irrigated throughout the area.  Sharp  debridement was then carried out of the early necrotic tissue that was  already organizing.  Following aggressive debridement and drainage, the  wound was then thoroughly irrigated once again and then closed over a  blue vessel loop drain, loosely reapproximated with several 3-0 nylon  simple sutures.  Adaptic dressing and 10 mL of 0.25% Marcaine were  infiltrated locally.  Sterile compressive bandage then applied.  The  patient then placed in a well-padded volar splint.  She was extubated  and  taken to the recovery room in good condition.   POSTOPERATIVE PLAN:  The patient admitted for IV antibiotics and pain  control.  She will be followed closely.  She will be seen within 48  hours.  She may require further incision and drainage if her symptoms do  not improve.      Madelynn Done, MD  Electronically Signed     FWO/MEDQ  D:  04/01/2009  T:  04/02/2009  Job:  [id number]

## 2011-01-29 NOTE — Op Note (Signed)
[name], [name]                ACCOUNT NO.:  [id number]  MEDICAL RECORD NO.:  [id number]           PATIENT TYPE:  I  LOCATION:  5008                         FACILITY:  [facility]  PHYSICIAN:  Kerrin Champagne, M.D.   DATE OF BIRTH:  29-Jun-1951  DATE OF PROCEDURE:  01/07/2011 DATE OF DISCHARGE:  01/08/2011                              OPERATIVE REPORT   PREOPERATIVE DIAGNOSES:  Cervical spondylosis, C6-7 and C7-T1 with bilateral upper extremity paresthesias and numbness.  Central neck pain associated with cervical plate impingement at the C7-T1 level plate. Plate is impinging onto the superior aspect of the vertebral body of T1. The patient has kyphosis over the lower cervical segment, C5-6 and C6-7. 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  POSTOPERATIVE DIAGNOSES:  Cervical spondylosis, C6-7 and C7-T1 with bilateral upper extremity paresthesias and numbness.  Central neck pain associated with cervical plate impingement at the C7-T1 level plate. Plate is impinging onto the superior aspect of the vertebral body of T1. The patient has kyphosis over the lower cervical segment, C5-6 and C6-7. 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  PROCEDURE.:  Removal of anterior cervical plate and screws at the C5-C7 level.  The plate was a Trinica anterior cervical plate with six screws. This was removed from the C6-C7 level.  The patient then had a corpectomy of the C7 vertebral body for decompression of both the C6-7 foramen and C7-T1 foramen.  Bone grafting with a tricortical iliac crest strut graft, allograft, and local bone graft.  This performed at the C7 level bridging from C6-T1 with anterior cervical plate fixation from C5- T1 using an Alphatec 8-hole precontoured to 38 mm length plate and six screws 14 mm length each.  SURGEON:  Kerrin Champagne, MD  ASSISTANT:  Wende Neighbors, PA-C.  ANESTHESIA:  General via oral tracheal intubation, Dr. Jean Rosenthal, supplemented with local  infiltration with Marcaine 0.5% with 1:200,000 epinephrine 10 mL.  DRAINS:  Foley to straight drain, 10-French TLS drain anterior cervical spine to a redtop tube.  BRIEF CLINICAL HISTORY:  This patient is a 60 year old right-handed female who has been treated for chronic pain management for cervical pain and bilateral shoulder pain with upper extremity numbness and paresthesias and a lower cervical distribution.  She has undergone extensive workup and was felt to be chronic pain patient.  However, her studies have indicated previous anterior cervical diskectomy and fusion at C5-6 and C6-7 performed in 2003 with evidence of diminished healing of the graft at the C6-7 level with the development of kyphosis at C5-6 and C6-7 with plate impingement onto the superior aspect of the vertebral body of T1.  Her imaging studies have demonstrated foraminal entrapment secondary to severe spondylosis changes and uncovertebral changes, particularly on the left side at C7-T1 bilaterally at C6-7, and right side mild at C7-T1.  The patient's radiographs also showed some mild subluxation 1-2 mm at the C3-4 and C4-5 levels, however, no significant nerve root compression noted on imaging studies.  This patient has been taking narcotic medicines over a long period of time nearly 2 years, has  failed conservative management for her cervical discomfort.  She has obvious evidence of decreased healing at the C6-7 level with kyphosis that has developed an obvious plate impingement onto the superior aspect of T1.  Discussion with the patient regarding surgery, plan, risks versus benefits.  Plan is to perform corpectomy at the C6-7 the level removing the body centrally of C7, and re- decompressing the C7 nerve roots at C6-7.  Attempt to restore normal lordotic curve to the lower cervical spine.  Also, performed foraminotomy of C7-T1 and placed a strut graft anteriorly again to restore normal cervical lordosis and  then replaced plate from I6-N6 anteriorly.  The risks of surgery including infection, bleeding, nonunion have been discussed with the patient.  Risk to the nerve roots as well.  She understands the surgical procedures, accepted the risk involved.  Signed informed consent.  INTRAOPERATIVE FINDINGS:  The patient found to have significant kyphosis at the C6-7 level with the inferior aspect of the plate over the expanse C5-C7 impinging on the superior aspect of the vertebral body of T1. Fixation was performed with excellent screw purchase at C5, C6, and T1. No screw fixation at the C7 level due to corpectomy.  DESCRIPTION OF PROCEDURE:  This patient was seen in preoperative holding area with marking of the left neck previous incision site marked, X and my initials.  The patient received standard preoperative antibiotics. She was then transferred to the operating room via OR stretcher.  There, she was transferred to the OR table.  She had well-padded both lower extremities.  The skids were used for the arms, well padded at the sides.  She underwent intubation, atraumatic and induction of general anesthetic.  The neck in minimal extension with 5 pounds cervical halter traction.  Beach-chair type position.  PAS hose to both lower extremities.  Foley catheter was placed after induction of general anesthetic.  Standard prep with DuraPrep solution.  The anterior cervical spine extending from the chin to about the mid manubrial level. Both clavicles exposed and prepped.  Draped in the usual manner.  Iodine Vi-Drape was used.  Standard time-out protocol identifying the patient, procedure to be done.  The patient then had a incision ellipsing the old incision scar on the left side at the level of the proximate cricothyroid cartilage representing C7.  Incision ellipsing this skin of the old incision down to the platysma layer.  This platysma layer was then incised transversely in line with previous  old incision scar and then carefully freed up superiorly, inferiorly deep and superficial for later reapproximation.  Anterior border of the sternocleidomastoid identified and carefully freed up the scar tissue both superiorly and inferiorly.  The interval between the trachea and esophagus medially and the carotid sheath laterally was then carefully developed using Metzenbaum scissors.  The omohyoid muscle identified and carefully freed from the lateral aspect of the trachea and then a retractor placed beneath this was then divided and then able to provide excellent retraction of the trachea and esophagus medially.  Carefully, the interval between the trachea and esophagus medial carotid sheath was developed to the anterior aspect of cervical spine was easily felt.  The plate was easily felt.  The esophagus easily felt with a temperature probe in place.  Incision was made along the medial border of longus colli muscle using a 15 blade scalpel.  The prevertebral fascia carefully freed up over the anterior aspect of the cervical plate extending from C5 down to C7.  The impingement of the  plate onto the superior aspect of T1 was easily identified, and the patient's cervical spine did allow for excellent exposure of T1 in the lower aspect of the neck.  The superior aspect of the T1 vertebral body anteriorly was carefully freed up using Key elevator and then electrocautery used to cauterize the superior one-half of this vertebral body.  Felt that there was a large osteophyte on the anterior superior aspect of the body of T1, so this was resected using Leksell rongeur.  Spinal needle was then placed at the expected C7-T1, and at the expected C6-7 level, and intraoperative radiograph demonstrated the needle at C6-7.  Utilizing this then the levels were easily identified with the lateral radiograph in place.  The medial border longus colli muscle on both sides was carefully freed up.  There were  large spurs over the inferior aspect of the plate and over the superior aspect of T1 present.  Boss McCullough retractor was inserted, the smallest blades were used, and these were inserted with its blade beneath the medial border longus colli muscle on both sides obtaining excellent protection of the soft tissue structures medially.  Osteotome was then used to free up bone callus that developed over the inferior aspect of the plate, and then this was used to divide the callus at the inferior aspect of the plate as well transversely. This bone material was saved for later bone grafting purposes.  The plate, screw configuration was Trinica-type plate, and the screwdriver was brought to the field sterilely that was used for the locking apparatus as well as removal of the screws themselves.  Using loupe magnification and headlamp, each of the screws were removed at the C5 level as well as C6.  C7, the anterior callus required resection first before the screws could be removed at C7.  They were able to be removed. This was done after first bringing up the locking mechanism and turning locking mechanism 180 degrees.  Each of the screws were removed, the plate then easily lifted off the Cobb elevator along its lateral aspect. The anterior aspect of the fusion site was examined.  Both C5-6 and C6-7 did not show any significant motion present.  The C6-7 level was able to be identified with a Penfield 4 as was the disk space at C7-T1.  At this time, a high-speed bur was then used to begin a trough at the expected C6-7 level transversely and then carefully resecting downwards and sagittally the using high-speed bur to carefully maintain the midline. The patient's cervical plate had been noted be placed primarily on the left side just inside the left hand cortical wall of the vertebral body screws at present.  The screws also at 14 mm length extended just short of the posterior cortex of vertebral  body of C7 so that both the depth of the trough being made centrally during the corpectomy as well as the lateral extent to the left side was easily determined.  The right-sided trough was primarily determined using the spacer placed into the trough and allowing the trough to be at approximately 16-17 mm in width.  A high-speed bur was used, a cutting bur at 2.5-3 mm width was used to remove bone both superiorly to inferiorly maintaining a sagittal orientation, and then continued down to the disk space at the C7-T1. The continued burring was carried out until the posterior cortex was then encountered and a small amount of cortical bone had been removed on the left side at the C7  level.  A titanium nerve hook was then used easily identified by the opening into the canal at this level.  The operating room microscope was then sterilely draped, brought into the field.  Under the operating room microscope, the central portions of the vertebral body posterior aspect were resected using microcurettes as well as well 2-3 mm Kerrisons.  Entire central portions of the vertebral body were resected leaving a shallow bone on the left side representing the left lateral wall of the C7 vertebral body and the thicker wall on the right side about 3 mm width representing right wall of the vertebral body.  The endplates, inferior aspect of C6 and superior aspect of T1 were carefully burred debriding any soft tissue representing endplate material, cartilaginous material down to bleeding bone surfaces representing bony endplate material.  Using the operating room microscope, each of the foramen left C7, right C7, left T1, and right T2 were carefully decompressed.  On the left side, C8 neural foramen was carefully resected off the uncovertebral spurs extending out far lateral as the vertebral artery had already moved anterolateral to its normal position at this level.  This was done such that the C8 nerve root  was completely freed up and assessed and felt to be well decompressed at the hypertrophic ligamentum flavum that was pressing the nerve root, did appear to have a severe amount of compression.  There was significant swelling of the C8 nerve root on the left side on to the C7-T1 foraminotomy on this left side, right C8 foraminotomy was also performed using the microscope, 1 and 2-mm Kerrisons.  When this was completed, a black regular nerve root could be passed out the neural foramen bilaterally at C8 demonstrating the patency of the lateral aspect of the pedicle that was easily palpated at the T1 level both sides indicating that decompression had been completed at this level.  Similarly at the C7 level.  Note that distracting pins were used during this case, placed in the vertebral body of C6 and in the vertebral body of T1.  This distraction area was retentioned slightly, and the height and width of the trough for corpectomy was carefully measured about 17 mm side-to- side, the depth of nearly 20 mm, and height nearly 35 mm.  With this, then a tricortical iliac crest allograft, bone graft was carefully fashioned through the dimensions of the trough present leading slightly longer length.  The depth of about 15 mL was chosen to allow for subset beneath the anterior aspect of both the lower aspect of C6 and the upper aspect of T1.  Graft was carefully fashioned using oscillating saw as well as high-speed bur to the dimensions mentioned.  Slightly tapered in width from superior to inferior.  The edges of the bone superiorly andinferiorly were carefully trimmed to cortical bone to allow cancellus bone to engage the lower end of the port of the vertebral body of C6 and superior aspect of T1.  Using the appropriate fashion, the appliance was then able to be placed.  Note that hemostasis of the vertebrectomy or corpectomy site was carried out using combination of closed FloSeal as well as  Gelfoam and thrombin soaked.  For the initial removal of bone using a high-speed bur, bone wax was applied intermittently for hemostasis purposes.  With this completed, then the graft was placed over the intervertebral disk space.  Taken to ensure there was no disk material present over the graft insertion.  The graft was then carefully lodged  onto the edge of the T1 vertebral body in the upper aspect over this and the engaged to the inferior aspect of C6.  A bone tamp then used to carefully tip the graft into place after retention of the distraction system.  The graft was easily placed.  The distraction pins were then removed anteriorly.  The patient then had distraction pins removed and bone wax applied to bleeding cancellous bone surfaces.  Additional local bone graft was then packed on the right side of the allograft bone extending from C6-T1. Care taken not to overpack this and to some extent on the left side. Irrigation was carried out.  The length of plate was chosen based on the previous plate extending larger, so that a 38 mm length plate was chosen.  This plate placed against the anterior aspect of the cervical spine at C5 and C6, and showed to extend down to the vertebral body of T1 be easily.  Plate was then removed and then careful smoothing of the anterior aspect of the vertebral body of C6-C5 removing scar tissue was present also over the superior aspect of T1 was carried out such that the plate could approximate these areas without any significant tension. Plate was then fastened to the C6 level with a temporary fixation pin. Two drill holes were then placed inferiorly, first on the right side at T1, and then 14-mm drill used to place a hole to the left-side for placement of the screw on the left side at T1.  These screws gained excellent purchase.  The traction over the cervical spine was released, and the single retaining pin on the left at C6 was removed.  A drill  of 14-mm length and then used to drill the area of previous temporary fixation and 14-mm screw placed here then on the right side at C6.  At the C5 level, the holes were previously approximating those present, so that these could be filled with two screws.  They were bail-out screws of 14 mm length.  After again drilling these were done about the region of the screws at C6, were also bail-out type screws or revision screws were to allow better purchase on previous screw holes.  Those screws at the T1 level were normal self-threading variable axis screws used and were aligned.  Of note, all the screw heads engaged the locking mechanism quite nicely and seated well into the plate so that there was no prominence.  Irrigation was then carried out.  Intraoperative radiograph demonstrated plate and screws in good position and alignment. Graft appeared to be in good position without any evidence of retropulsion.  Examination of the esophagus under the microscope demonstrated no evidence of crest.  All bleeders along the medial colli muscles were cauterized using bipolar electrocautery.  After irrigation further then a 10-French TLS drain was placed at the depth of the incision exiting over the lower aspect of the anterior cervical spine caudal to the incision site.  This was sewn in place with a 4-0 nylon stitch.  The patient then had the omohyoid muscle reapproximated with interrupted 2-0 Vicryl sutures closing the dead space here.  Platysma layer was reapproximated with interrupted 3-0 Vicryl sutures, subcu layers approximated with interrupted 3-0 Vicryl sutures.  The skin closed with a running subcuticular stitch of 4-0 Vicryl and then Dermabond was applied.  Mepilex bandage.  The TLS drain charged with a red top tube.  Note again that the standard time-out protocol was carried out at the beginning of  this patient's case prior to any incision.  At the end of the case, all instrument and  sponge counts were correct.  The patient then had Philadelphia collar applied.  She was then reactivated, extubated, returned to her bed and to the recovery room in satisfactory condition.  Again, all instrument and sponge counts were correct.  PHYSICIAN ASSISTANT'S RESPONSIBILITY:  Maud Deed performed duties of Designer, television/film set and she operated under the operating room microscope as well as performed assistance in transferring the patient from the stretcher to the OR bed and then positioning preoperatively.  She assisted padding the extremities which was done by application of pressure with application of PAS stocking.  The arms were well padded at the site of the skids with the aid of the assistant and application of cervical traction.  Intraoperatively, she performed duties of Designer, television/film set under the microscope, out of the microscope, careful retraction of neural structures.  Careful irrigation, careful suctioning about the neural structures of the cervical cord and nerve roots was performed. She assisted the insertion of the graft holding it well to the connected appliance.  She performed closure of the incision site at the end of the case from the patient platysma layer to the skin.  Application of the patient's cervical collar and assistance in transfer of the patient on to the OR table to bed postoperatively.     Kerrin Champagne, M.D.     JEN/MEDQ  D:  01/10/2011  T:  01/11/2011  Job:  [id number]  Electronically Signed by Vira Browns M.D. on 01/29/2011 05:59:58 PM

## 2011-01-31 NOTE — Op Note (Signed)
[name], [name]                ACCOUNT NO.:  [id number]   MEDICAL RECORD NO.:  [id number]          PATIENT TYPE:  AMB   LOCATION:  DSC                          FACILITY:  [facility]   PHYSICIAN:  Artist Pais. 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   PROCEDURE:  1.  Right open wedge osteotomy distal radius malunion with iliac crest bone      grafting.  2.  Right four-corner fusion and scaphoid excision.  3.  Right carpal tunnel release.   SURGEON:  Weingold and [place].   ANESTHESIA:  General.   TOURNIQUET TIME:  Two hours.   COMPLICATIONS:  None.   DRAINS:  None.   OPERATIVE REPORT:  The patient was taken to the operating room with the  induction of adequate general anesthesia.  The right upper extremity and  right iliac crest were prepped and draped in the usual sterile fashion.  An  Esmarch was used to exsanguinate limb.  Tourniquet was then inflated 250  mmHg.  At this point in time a midline incision was made over the  radiocarpal, mid carpal and radioscaphoid articulations.  The skin was  incised.  The third dorsal compartment was identified and removed from  fibroosseous sheath, retracted radially.  The interval between the second  and fourth dorsal compartments was split.  Dissection was carried down to  the dorsal cortex of the distal radius and radiocarpal and mid carpal  joints.  A capsular flap was elevated.  The distal end of the distal radius  was then dissected subperiosteally.  An open wedge osteotomy was performed  using an  oscillating saw.  Once this was done an iliac crest bone graft was  harvested from the right iliac crest using the usual sterile technique.  The  wedge graft was the placed into the opening wedge dorsally and fixed with a  small fragment T plate under direct and fluoroscopic guidance.  Once this  had been done, radial and inclination had been restored both the AP and  lateral view.  After this was done the scaphoid was excised.  The four  corners between the lunate, capitate, hamate and triquetrum were  decorticated and fixed with the TriMed four-corner fusion set using eight  cortical screws.  Intraoperative fluoroscopy revealed adequate reduction  both the AP, lateral and oblique view.  After this was done the iliac crest  bone graft site was closed in layers with 0-Vicryl and a 4-0 Monocryl on the  skin followed by a closure of the wrist with 4-0 Vicryl and 4-0 Monocryl on  the skin as well.  The hand was then fully supinated and a 2-cm incision was  made in the palmar aspect of  the right hand in line with long finger  metacarpal.  The skin was incised.  The palmar fascia was identified and  split.  The distal edge of the transverse carpal ligament was identified and  split with a 15 blade.  The median nerve was identified, protected with a  Therapist, nutritional, the remaining aspects of the transverse carpal ligament  divided under direct vision using a curved blunt scissor.  The canal was  inspected.  There were no __________ present.  It was irrigated and loosely  closed with 3-0 Prolene subcuticular stitch.  Steri-Strips, 4 X 4 __________  and a compressive dressing was applied to the wrist and including sugar-tong  splint.  A compressive dressing was applied to the iliac crest bone graft  site.  The patient tolerated both procedures well and went to the recovery  room in stable fashion.      Artist Pais Mina Marble, M.D.  Electronically Signed     MAW/MEDQ  D:  03/30/2006  T:   03/30/2006  Job:  [id number]

## 2011-01-31 NOTE — Discharge Summary (Signed)
[name], [name]                 ACCOUNT NO.:  [id number]   MEDICAL RECORD NO.:  [id number]          PATIENT TYPE:  INP   LOCATION:  5009                         FACILITY:  [facility]   PHYSICIAN:  Georges Lynch. Gioffre, M.D.DATE OF BIRTH:  [birth year]   DATE OF ADMISSION:  09/30/2004  DATE OF DISCHARGE:  10/04/2004                                 DISCHARGE SUMMARY   ADMISSION DIAGNOSES:  1.  Left hip fracture.  2.  Depression.  3.  Osteoporosis.  4.  Anxiety.  5.  Chronic obstructive pulmonary disease.  6.  Gastroesophageal reflux disease.   DISCHARGE DIAGNOSES:  1.  Left hip fracture, status post open reduction internal fixation, left      hip.  2.  Depression.  3.  Osteoporosis.  4.  Anxiety.  5.  Chronic obstructive pulmonary disease.  6.  Gastroesophageal reflux disease.   PROCEDURE:  The patient was taken to the operating room on September 30, 2004,  for an ORIF of the left hip.  Surgeon was Dr. Georges Lynch. Gioffre, assistant  Ebbie Ridge. Paitsel, P.A.-C.  The surgery was performed under general  anesthesia.   CONSULTATIONS:  PT, OT.   HISTORY OF PRESENT ILLNESS:  This patient is a 60 year old female who  presented to the emergency room with left hip pain.  She tripped over a box  on the evening prior to her admission, injuring her left hip.  She was  evaluated in the emergency room and found to have a left femoral neck  fracture.  She was admitted for an ORIF of the left hip.   LABORATORY DATA:  Admission CBC:  WBC 10.0, RBC 4.22, hemoglobin 12.6,  hematocrit 36.9, platelet count 289.  Admission PT 12.4, INR 0.9.  Admission  chemistry:  Sodium low at 129, potassium 3.8, chloride 100, CO2 of 24,  glucose of 100, BUN 12, creatinine 0.7, calcium 8.5.   An admission chest x-ray revealed no active disease, but she did have a left  lower lobe nodule.  A preoperative x-ray of the left hip revealed a left  femoral neck fracture.  A postoperative x-ray of the left hip revealed an  ORIF.  Left hip near anatomic alignment.   Preoperative electrocardiogram:  Normal sinus rhythm at a rate of 87.   HOSPITAL COURSE:  This patient was admitted to [facility]  She was taken to the operating room.  She underwent the above-stated  procedure without complications.  She tolerated the procedure well and was  allowed to return to the recovery room and then to the orthopedic floor, to  continue her postoperative care.  The patient was placed on PCA analgesia  for pain control.  The patient's pain was well-controlled, and she was  gradually weaned off the PCA to oral analgesics.  The PCA was discontinued  on postoperative day three.  PT was consulted for gait training ambulation.  The patient was able to bear partial weight with the walker.  She progressed  well.  The patient's hemoglobin and hematocrit were followed throughout her  hospitalization.  She did have  a postoperative drop in her hemoglobin to  8.5.  That stabilized prior to discharge.  She did not require a blood  transfusion.   DISPOSITION:  The patient worked with a physical therapist and was able to  ambulate with the aid of a walker and was discharged home on October 04, 2004.   DISCHARGE MEDICATIONS:  1.  To resume her current medications.  2.  She will take aspirin 325 mg once daily.  3.  Mepergan Fortis one or two q.6h. as needed for pain.   DIET:  As tolerated.   ACTIVITY:  Weightbearing:  The patient can weight bear with her walker.   FOLLOWUP:  The patient is scheduled to follow up with Dr. Georges Lynch. Gioffre  in the office in one week.  She will call to schedule the appointment.   WOUND CARE:  She will keep the wound dry and clean and apply daily dressing  changes.      LKP/MEDQ  D:  12/17/2004  T:  12/17/2004  Job:  [id number]

## 2011-01-31 NOTE — H&P (Signed)
[name], [name] NO.:  [id number]   MEDICAL RECORD NO.:  [id number]          PATIENT TYPE:  EMS   LOCATION:  MAJO                         FACILITY:  [facility]   PHYSICIAN:  Georges Lynch. Gioffre, M.D.DATE OF BIRTH:  Mar 20, 1951   DATE OF ADMISSION:  09/30/2004  DATE OF DISCHARGE:                                HISTORY & PHYSICAL   CHIEF COMPLAINT:  Left hip pain.   HISTORY:  The patient tripped over a box last p.m. injuring her left hip.  She was seen in the emergency department and was found to have a left  femoral neck fracture, and she will be admitted for ORIF of left hip.   ALLERGIES:  PENICILLIN CAUSES A RASH.  SHE IS ALSO ALLERGIC TO DARVOCET.   PAST MEDICAL HISTORY:  1.  Depression.  2.  Osteoporosis.  3.  Anxiety.  4.  COPD.  5.  Gastroesophageal reflux disease.   PAST SURGICAL HISTORY:  1.  Breast augmentation x2.  2.  Hysterectomy in 1992.  3.  Cholecystectomy.  4.  ORIF of the right femur in 2000, by Dr. Ronald Pippins at [facility].   CURRENT MEDICATIONS:  1.  Aspirin daily.  2.  Ambien 10 mg h.s.  3.  Ativan 1 mg b.i.d.  4.  Effexor XR 150 mg daily.  5.  Wellbutrin XL 300 mg daily.  6.  Amitriptyline 100 mg h.s.  7.  Trazodone 100 mg two h.s.  8.  Protonix 40 mg daily.  9.  Albuterol inhaler two puffs b.i.d.  10. Syntest, which is hormone replacement.  She takes that daily.   FAMILY HISTORY:  Unavailable.   REVIEW OF SYMPTOMS:  GENERAL: Denies weight change, fever, chills, or  fatigue.  HEENT: Denies headache, visual changes, hearing loss, or sore  throat.  CARDIOVASCULAR:  Denies chest pain, palpitations, shortness of  breath or orthopnea.  PULMONARY: Denies dyspnea, wheezing, cough, sputum  production, or hemoptysis.  GASTROINTESTINAL: Denies dysphagia, nausea,  vomiting, hematemesis, or abdominal pain.  GENITOURINARY: Denies dysuria,  frequency, urgency, or hematuria.  ENDOCRINE: Denies polyuria, polydipsia,  appetite change,  heat/cold intolerance.  MUSCULOSKELETAL: The patient has  left hip pain.  NEUROLOGICAL: Denies dizziness, vertigo, syncope, seizures.  SKIN: Denies itching, rashes, masses, or moles.   PHYSICAL EXAMINATION:  VITAL SIGNS:  Temperature 98.7, pulse 78, respiratory  rate 22, blood pressure 130/70.  GENERAL:  This is a 60 year old female in no acute distress.  HEENT:  PERRL, EOMI, pharynx clear.  NECK:  Supple without masses.  CHEST:  Clear to auscultation bilaterally.  No wheezing, rales, or rhonchi  noted.  HEART:  Regular rate and rhythm without murmur.  ABDOMEN:  Positive bowel sounds, soft, nontender.  EXTREMITIES:  Painful range of motion of her left hip.  She has good pulses  in her foot.  SKIN:  Warm and dry.  Sensation is intact.   STUDIES/LABS:  X-ray of her left hip shows a left femoral neck fracture.   IMPRESSION:  Left femoral neck fracture.   PLAN:  The patient is to be admitted to [facility] for ORIF of the  left hip.       LKP/MEDQ  D:  09/30/2004  T:  09/30/2004  Job:  [id number]

## 2011-01-31 NOTE — Discharge Summary (Signed)
[facility]  Patient:    Annette, Williams                          MRN: [id number] Adm. Date:  [id number] Disc. Date: [id number] Attending:  Thermon Leyland                           Discharge Summary  DISCHARGE DIAGNOSES: 1. Stress urinary incontinence. 2. Postoperative bleeding with pelvic hematoma. 3. Anemia. 4. Hypotension.  PROCEDURES:  Pubovaginal sling, flexible cystoscopy, and suprapubic tube placement on May 27, 2000.  HOSPITAL COURSE:  [name] is a 60 year old female.  She has had progressively worsening stress urinary incontinence. This was evaluated and felt to be secondary to urethra hypomobility.  She did have objective leakage. This incontinence was bothering her a great deal, and, after extensive discussion about treatment options, the patient elected to have a pubovaginal sling.  At the time of surgery, she underwent what appeared to be a reasonably uneventful pubovaginal sling procedure with suprapubic tube placement.  She did have a little bit of venous oozing from the retropubic space, but that seemed to diminish once the sling was in position.  The patient had vaginal packing and had no significant vaginal bleeding.  On the night of surgery, the patient did develop hypotension.  Her blood pressure fell to 50s/palpable.  This seemed to occur at the same time she received some intravenous morphine.  The patient responded initially to fluid resuscitation.  A stat hemoglobin revealed that her hemoglobin had dropped to 7.4.  Preoperatively, her hemoglobin was 13.2.  We did not have that significant bleeding intraoperatively.  Her estimated blood loss was approximately 400 cc.  She had no vaginal bleeding, and the only obvious explanation was that she was bleeding probably in the retropubic space and had been developing a pelvic hematoma.  She did receive 2 units of packed red blood cells.  She had stabilization of her blood  pressure, and her hemoglobin remained stable.  After 2 units of packed blood cells, her hemoglobin increased to 9.7.  On postoperative day #2, her voiding trial was begun.  She was able to void. Her hemoglobin was checked and remained reasonably stable.  She remained afebrile with normal vital signs and had significant improvement in her pain. She was discharged home on postoperative day #3.  DISPOSITION:  The patient was discharged home with suprapubic tube.  She understood how to use the tube and was going to continue with her voiding trial.  She was sent home with pain medication and instructions to take iron supplementation.  She will follow up in our office in five to seven days for staple removal and possible suprapubic tube removal. DD:  06/24/00 TD:  06/26/00 Job: [id number]

## 2011-01-31 NOTE — Op Note (Signed)
NAMEJASMEEN, [name] NO.:  [id number]   MEDICAL RECORD NO.:  [id number]                   PATIENT TYPE:  INP   LOCATION:  3172                                 FACILITY:  [facility]   PHYSICIAN:  Danae Orleans. Venetia Maxon, M.D.               DATE OF BIRTH:  1951-02-11   DATE OF PROCEDURE:  08/04/2002  DATE OF DISCHARGE:                                 OPERATIVE REPORT   PREOPERATIVE DIAGNOSES:  1. Herniated cervical disk with cervical spondylosis.  2. Degenerative disk disease.  3. Cervical radiculopathy, C5-6 and C6-7 levels.   POSTOPERATIVE DIAGNOSES:  1. Herniated cervical disk with cervical spondylosis.  2. Degenerative disk disease.  3. Cervical radiculopathy, C5-6 and C6-7 levels.   PROCEDURE:  Anterior cervical diskectomy and fusion, C5-6 and C6-7 levels  with allograft, bone graft and anterior cervical plate.   SURGEON:  Dr. Venetia Maxon.   ASSISTANT:  Stefani Dama, M.D. .   ANESTHESIA:  General endotracheal anesthesia.   ESTIMATED BLOOD LOSS:  75 cc.   COMPLICATIONS:  None.   DISPOSITION:  Recovery room.   INDICATIONS FOR PROCEDURE:  The patient is a 60 year old woman with marked  spondylosis and foraminal stenosis with cervical disk herniations at C5-6  and C6-7 levels who elected to have surgery for anterior cervical diskectomy  and fusion at these levels.   DESCRIPTION OF PROCEDURE:  [name] is brought to the operating room.  Following satisfactory and uncomplicated induction of general endotracheal  anesthesia and placement of intravenous lines, the patient was placed in the  supine position on the operating room table and the neck was placed in  slight extension.  She was placed in ten pounds of halter traction and her  anterior neck was then prepped and draped in the usual sterile fashion.  An  incision was made from the midline to the anterior border of the  sternocleidomastoid muscle within lower neck creases.  This was carried  through the platysmal layer, subplatysmal dissection was performed which was  from the anterior border of the sternocleidomastoid muscle, and using blunt  dissection, the carotid sheath was kept lateral, the trachea and the  esophagus kept medial, exposing the anterior cervical spine.  A large  osteophyte was felt that corresponded to the C6-7 level, and a spinal needle  was placed at this level, intraoperative x-ray confirmed this to be the C6-7  level.  Further exposure was then performed, taking down the longus coli  muscles from the anterior cervical spine from C5 to T7 levels bilaterally,  using electrocautery and Key elevator.  A  self-retaining retractor was then  placed to facilitate exposure.  Up down retractor was also placed.  The  large central osteophytes, were removed with Leksell rongeur.  The disk  spaces were incised with a 15 blade and disk material was removed in  piecemeal fashion, a very spondylitic, markedly  degenerated disk levels.  __________ then placed, and after removing residual disk material and  cartilaginous material with a variety of micro curets, the microscope was  brought into field, and using microdissection technique and the high speed  drill with a bur, the end plates of C5 and C6 were decorticated and large  rents and spurs were removed.  The posterior longitudinal ligament was then  incised and removed in a piecemeal fashion using 2 and 3 mm gold-tipped  Kerrison rongeurs.  Both the C6 nerve roots were decompressed laterally as  they extended up in neural foramina.  Hemostasis was assured with Gelfoam  soaked in thrombin.  7 mm tricortical iliac crest bone graft was then placed  in the interspace and counter sunk appropriately.  Attention was then turned  to the C6-7 level where similar decompression was performed.  Again, the C7  nerve roots were decompressed widely, on the right side there appeared to be  ruptured disk material within the neural  foramen which was compressing the  lateral aspect of the spinal cord and the C7 nerve root.  The trial spacer  was used an 8 mm tricortical iliac crest bone graft was selected, cut to the  appropriate depth and placed in the interspace and counter sunk  appropriately.  Subsequently, the microscope was taken out of the field, and  using the Trinica anterior cervical plate, a 34 mm plate was placed which  was then affixed to the vertebral bodies with 14 mm variable angle screws,  two at C5, two at C6 and two at C7 level, all screws had excellent purchase.  Final x-ray confirmed position of the bone grafts in the anterior cervical  plate.  Locking mechanisms were employed.  Prior to placing the plate, the  halter traction was removed.  The wound was then copiously irrigated with  Bacitracin saline, there was excellent hemostasis.  Soft tissues were  inspected and found to be in good repair.  The platysmal area was then  closed with 3-0 Vicryl sutures and the skin edges were approximated with the  running 4-0 Vicryl subcuticular stitch, the wound was dressed with  Dermabond.  The patient was extubated in the operating room, taken to the  recovery room in stable and satisfactory condition, having tolerated the  operation well.  Counts were correct at the end of the case.                                               Danae Orleans. Venetia Maxon, M.D.    JDS/MEDQ  D:  08/04/2002  T:  08/04/2002  Job:  [id number]

## 2011-01-31 NOTE — Op Note (Signed)
NAMEJAMEL, DUNTON                 ACCOUNT NO.:  [id number]   MEDICAL RECORD NO.:  [id number]          PATIENT TYPE:  INP   LOCATION:  1825                         FACILITY:  [facility]   PHYSICIAN:  Georges Lynch. Gioffre, M.D.DATE OF BIRTH:  [birth year]   DATE OF PROCEDURE:  09/30/2004  DATE OF DISCHARGE:                                 OPERATIVE REPORT   PREOPERATIVE DIAGNOSIS:  She is a 61 year old female that fell and injured  her left hip, was brought to the emergency room at [facility] today on  September 30, 2004.  X-rays revealed an impacted valgus fracture of the neck  of her left hip.   POSTOPERATIVE DIAGNOSIS:  She is a 60 year old female that fell and injured  her left hip, was brought to the emergency room at [facility] today on  September 30, 2004.  X-rays revealed an impacted valgus fracture of the neck  of her left hip.   OPERATION:  Insertion of two Ace cannulated pins, left hip.  The lengths  were 75 mm in length and 80 mm in length.   SURGEON:  Georges Lynch. Darrelyn Hillock, M.D.   ASSISTANT:  Ebbie Ridge. Paitsel, P.A.   PROCEDURE:  Under general anesthesia, the patient on a fracture table, the C-  arm was brought in and routine orthopedic prep and draping of the left hip  was carried out.  Prior to the sterile prep, we did an initial prep.  A  small incision was made over the lateral aspect of the left hip, bleeders  identified and cauterized.  I stripped the muscle down to the lateral  femoral cortex.  A drill hole was made and the guide pin was inserted under  fluoroscopy control.  We had excellent position.  I then inserted an Ace  cannulated pin measuring 80 mm in length.  Another second pin was inserted  in the same technique.  A guide pin was inserted across the fracture site,  and we selected a 75 mm length Ace captured cannulated pin.  We had good  fixation.  X-ray showed good purchase on the AP and lateral views.  I  thoroughly irrigated out the area, thrombin-soaked  Gelfoam was inserted, and  the wound was closed in layers in the usual fashion.  Sterile dressings were  applied.  She had 1 g of vancomycin IV preop.       RAG/MEDQ  D:  09/30/2004  T:  09/30/2004  Job:  [id number]

## 2011-01-31 NOTE — Op Note (Signed)
[name], [name]                ACCOUNT NO.:  [id number]   MEDICAL RECORD NO.:  [id number]          PATIENT TYPE:  AMB   LOCATION:  DSC                          FACILITY:  [facility]   PHYSICIAN:  Artist Pais. 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   PROCEDURE:  1. Darrach procedure with extensor carpi ulnaris tendon transfer for      stabilization.  2. Hardware removal x2, right wrist, including dorsal plate and      screws, small fragment from the distal radius, and TriMed 4-corner      fusion plate from the carpal bone.   SURGEON:  Weingold.   ASSISTANT:  None.   ANESTHESIA:  Axillary block.   TOURNIQUET TIME:  One hour and 23 minutes.   COMPLICATIONS:  None.   DRAINS:  None.   OPERATIVE REPORT:  The patient was taken to the operating suite.  After  the induction of adequate IV sedation and axillary block analgesia, the  right upper extremity was prepped and draped in the usual sterile  fashion.  An esmarch was used to exsanguinate the limb.  Tourniquet was  then inflated 250 mmHg.  At this point in time, using our previous  midline incision, the skin was incised.  Dissection was carried down to  the fourth dorsal compartment, which was opened.  The fourth dorsal  compartments were carefully retracted __________ between the fourth and  second dorsal compartments was then dissected down to the previously  placed small fragment dorsal plate where her osteotomy with iliac crest  bone graft had been done in the past.  The plate was carefully removed.  After this was done, the incision was extended over the radiocarpal  joint into the mid carpal joint area.  A dorsal  capsulotomy was  performed.  Dissection was carried down to the 4-corner fusion site  where the TriMed 4-corner fusion plate was removed, including the 9  screws.  Observation revealed a healed 4-corner fusion and a healed  distal radius osteotomy.  The wounds were then thoroughly irrigated.  The capsule overlying the mid carpal joint was closed with 3-0 Ethibond  in inverted sutures.  Dissection was then carried out to the ulnar side  until the distal ulna was exposed.  The sixth dorsal compartment was  incised.  The ECU was carefully retracted and a subperiosteal dissection  of the distal ulna was undertaken.  A saggital saw was used to cut the  neck of the ulna 2 or 3 cm from the joint surface and the distal ulna  was carefully subperiosteally dissected free from its distal  attachments.  Once this was done, this wound was irrigated.  A cortical  hole was made in the dorsal aspect of the remaining distal ulna and a  distally-based flip of the extensor  carpi ulnaris tendon was then  transected proximally and drawn through this drill hole onto itself and  sutured to stabilize the distal ulna.  After this was done, the capsule  overlying the fourth dorsal compartment and  the distal radial ulnar joint was carefully reapproximated in horizontal  mattress sutures to stabilize the distal ulna.  The wound was then  irrigated and closed with 4-0 Monocryl subcuticular stitch.  Steri-  Strips, 4 x 4s, fluffs, and a volar splint was applied.  The patient  tolerated all 3 procedures well and returned to recovery room in stable  fashion.      Artist Pais Mina Marble, M.D.  Electronically Signed     MAW/MEDQ  D:  10/07/2006  T:  10/07/2006  Job:  [id number]

## 2011-01-31 NOTE — Letter (Signed)
December 17, 2006    [facility]  [address]  [place], [place] [address]   RE:  Annette, Williams  MRN:  [id number]  /  DOB:  1951/03/02   To whom it may concern:   [name] has been a patient of mine for a number of years.  She has  been disabled and has been out of work since 2003.    Sincerely,      Georgina Quint. Plotnikov, MD  Electronically Signed    AVP/MedQ  DD: 12/17/2006  DT: 12/17/2006  Job #: [id number]

## 2011-01-31 NOTE — Op Note (Signed)
[facility]  Patient:    [name], [name]                          MRN: [id number] Proc. Date: 05/27/00 Adm. Date:  [id number] Attending:  Thermon Leyland                           Operative Report  PREOPERATIVE DIAGNOSIS:  Stress urinary incontinence.  POSTOPERATIVE DIAGNOSIS:  Stress urinary incontinence.  PROCEDURE:  Pubovaginal sling, flexible cystoscopy and suprapubic tube placement.  SURGEON:  Dr. Isabel Caprice.  ANESTHESIA:  General.  INDICATIONS FOR PROCEDURE:  Ms. Garceau is a 60 year old female. She has had a hysterectomy in the past. She complains of progressively worsening stress urinary incontinence which is quite bothersome to her and effects her quality of her life. Her evaluation in our office revealed no evidence of significant bladder dysfunction other than hypermobility with stress urinary leakage. She only had minimal irritative voiding symptoms. She had objective evidence of stress incontinence with a positive Marshall test. She underwent extensive discussion with regard to treatment options and elected to proceed with pubovaginal sling. She understood the risks and benefits of this procedure. She understands that there is a 90% chance of a cure of her stress incontinence. There is the possibility of increased irritative voiding symptoms after a procedure such as this. There is a 5% likelihood of urinary retention longer term and sometimes a secondary procedure is necessary. She also understands the risks of bleeding, infection, urinary tract injury, and full informed consent was obtained.  TECHNIQUE AND FINDINGS:  The patient was brought to the operating room where she had successful induction of general anesthesia. She was placed in lithotomy position and prepped and draped in the usual manner. A weighted vaginal speculum was utilized. A Foley catheter was placed and the bladder was emptied. The patient had minimal cystocele. An  inverted U incision was made with the apex at the mid urethra. A vaginal flap was then raised exposing the area of the bladder neck. The retropubic spaces were entered with blunt dissection. There was some oozing from the right side but a specific bleeder was not able to be identified and it appeared that it was probably a vein in the undersurface of the bone in that region. We then turned our attention to the suprapubic area where a small incision was made over the pubic symphysis. This was carried down to the level of the fascia. The sling itself was made with a piece of cadaveric fascia lata measuring approximately 2.5 x 8 cm. It was anchored on both ends with nylon suture. A tonsil clamp was then passed behind the pubic symphysis out the vaginal incision. This was done with the bladder empty and with the direct digital finger control within the retropubic space. This was done initially on the right side and the nylon sutures were grabbed and brought out of that side. This was then done in an analogous manner on the left side. In this manner, the sling was positioned right under the bladder neck. It was pexed in an open position with 3-0 Vicryl suture. The Foley catheter was then removed and flexible cystoscopy was performed. This showed the sling to be in good position at the bladder neck. There was no evidence of any bladder injury. The ureteral orifices were unremarkable and blue dye could be seen from both sides. The suprapubic tube was placed with  direct visual guidance. The vaginal incision was then closed. We did not remove any vaginal mucosa but simply sutured back the inverted U flap of vaginal tissue with some 2-0 Vicryl suture. Some packing was then utilized on the vagina. The sling itself was then tied loosely over 2 fingers. Marcaine was instilled in the wound. The subcutaneous layers were reapproximated with Vicryl and the skin was closed with clips. The suprapubic tube was  left to straight drainage. Estimated blood loss was approximately 400 cc. The patient appeared to tolerate the procedure well and there were no obvious complications. DD:  05/27/00 TD:  05/28/00 Job: [id number]

## 2011-02-14 ENCOUNTER — Other Ambulatory Visit (HOSPITAL_COMMUNITY): Payer: Medicare Other

## 2011-02-17 ENCOUNTER — Inpatient Hospital Stay (HOSPITAL_COMMUNITY): Payer: Medicare Other

## 2011-02-17 ENCOUNTER — Inpatient Hospital Stay (HOSPITAL_COMMUNITY)
Admission: RE | Admit: 2011-02-17 | Discharge: 2011-02-18 | DRG: 491 | Disposition: A | Discharge status: Home / Self Care | Payer: Medicare Other | Source: Ambulatory Visit | Attending: Specialist | Admitting: Specialist

## 2011-02-17 DIAGNOSIS — F17200 Nicotine dependence, unspecified, uncomplicated: Secondary | ICD-10-CM | POA: Diagnosis present

## 2011-02-17 DIAGNOSIS — IMO0002 Reserved for concepts with insufficient information to code with codable children: Secondary | ICD-10-CM | POA: Diagnosis present

## 2011-02-17 DIAGNOSIS — M48062 Spinal stenosis, lumbar region with neurogenic claudication: Principal | ICD-10-CM | POA: Diagnosis present

## 2011-02-17 DIAGNOSIS — M7130 Other bursal cyst, unspecified site: Secondary | ICD-10-CM | POA: Diagnosis present

## 2011-02-17 LAB — BASIC METABOLIC PANEL
GFR calc Af Amer: >60 mL/min (ref >60)
GFR calc non Af Amer: >60 mL/min (ref >60)
Glucose, Bld: 98 mg/dL (ref 70–99)
Potassium: 4.3 mEq/L (ref 3.5–5.1)
Sodium: 138 mEq/L (ref 135–145)

## 2011-02-17 LAB — DIFFERENTIAL
Basophils Relative: 2 % — ABNORMAL HIGH (ref 0–1)
Lymphs Abs: 1.7 10*3/uL (ref 0.7–4.0)
Monocytes Relative: 11 % (ref 3–12)
Neutro Abs: 2 10*3/uL (ref 1.7–7.7)
Neutrophils Relative %: 44 % (ref 43–77)

## 2011-02-17 LAB — CBC
Hemoglobin: 12.3 g/dL (ref 12.0–15.0)
MCH: 30.4 pg (ref 26.0–34.0)
MCV: 90.3 fL (ref 78.0–100.0)
RBC: 4.04 MIL/uL (ref 3.87–5.11)

## 2011-02-17 LAB — URINALYSIS, ROUTINE W REFLEX MICROSCOPIC
Bilirubin Urine: NEGATIVE
Nitrite: NEGATIVE
Specific Gravity, Urine: 1.023 (ref 1.005–1.030)
pH: 7 (ref 5.0–8.0)

## 2011-02-17 LAB — SURGICAL PCR SCREEN
MRSA, PCR: NEGATIVE
Staphylococcus aureus: NEGATIVE

## 2011-02-17 LAB — PROTIME-INR: Prothrombin Time: 12.4 seconds (ref 11.6–15.2)

## 2011-02-18 LAB — BASIC METABOLIC PANEL
Calcium: 7.9 mg/dL — ABNORMAL LOW (ref 8.4–10.5)
GFR calc Af Amer: >60 mL/min (ref >60)
GFR calc non Af Amer: >60 mL/min (ref >60)
Potassium: 4.1 mEq/L (ref 3.5–5.1)
Sodium: 139 mEq/L (ref 135–145)

## 2011-02-18 LAB — CBC
HCT: 24.7 % — ABNORMAL LOW (ref 36.0–46.0)
MCHC: 33.6 g/dL (ref 30.0–36.0)
Platelets: 201 10*3/uL (ref 150–400)
RDW: 14.7 % (ref 11.5–15.5)
WBC: 9.9 10*3/uL (ref 4.0–10.5)

## 2011-02-21 NOTE — Op Note (Signed)
NAMELOLA, CZERWONKA NO.:  [id number]  MEDICAL RECORD NO.:  [id number]  LOCATION:                                 FACILITY:  PHYSICIAN:  Kerrin Champagne, M.D.   DATE OF BIRTH:  [birth year]  DATE OF PROCEDURE:  02/17/2011 DATE OF DISCHARGE:                              OPERATIVE REPORT   PREOPERATIVE DIAGNOSIS:  Lumbar spinal stenosis at L3-4 moderate, severe at L4-5, mild at L2-3.  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  Mild stenosis at the L2-3 level.  PROCEDURES:  Central decompressive laminectomy at L3-4 and L4-5, lateral recess decompression at L2-3.  Excision of synovial cyst, right side L4- 5.  Decompression of bilateral nerve roots at the L3, L4, and L5 at the level of the foramen. The operating room microscope was used during this procedure.  SURGEON:  Kerrin Champagne, MD  ASSISTANT:  Wende Neighbors, PA  ANESTHESIA:  General via orotracheal intubation, Dr. Judie Petit, anesthesiologist of record, supplemented with local infiltration of Marcaine 0.5% with 1:200,000 epinephrine 10 mL.  ESTIMATED BLOOD LOSS:  150 mL.  DRAINS: 1. Hemovac x1, left lumbar. 2. Foley to straight drain.  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  Six weeks ago, she underwent a vertebral body corpectomy at the C7 level with strut graft from C6-T1 with decompression of the C6-7 and C7-T1 foramen and plates and screws from C5-T1.  She tolerated this procedure well, has done extremely well following the procedure with diminished pain and  discomfort into her neck and shoulders and arms and decreased pain into her arms and shoulders.  She has been followed in the office.  She has had persistent pain into her lower back, radiation to her lower extremities with findings on MRI studies prior to her cervical spine surgery which has indicated a significant lumbar spinal stenosis at L4-5, moderate lateral recess stenosis at 3-4 level, mild at L2-3.  She has had attempted conservative management therapy, it has not been a benefit, epidural steroids seemed to worsen the discomfort.  She has typical pattern of neurogenic claudication with pain with standing and ambulation, very minimal listhesis of L4 and L5, not worsened with flexion/extension.  Because of her persistent pain, discomfort, worsening neurogenic claudication with findings of lumbar stenosis, she was felt to be a candidate for central decompressive laminectomy.  The 4- 5 anterior listhesis was felt to be mild of less than 4 mm with no significant change with flexion/extension, so the fusion was not entertained.  The patient was brought to the operating room to undergo a lumbar decompressive laminectomy for severe lumbar spinal stenosis and moderate stenosis.  She understands the risks and benefits of the procedure as stated and signed informed consent.  DESCRIPTION OF THE PROCEDURE:  This patient was seen in the preoperative holding area.  She had marking of the lumbar spine at the expected L3 and L4 levels.  She received standard preoperative antibiotics of vancomycin for PENICILLIN allergy.  She was transported to the operating room, there she underwent induction of general anesthesia and intubation was atraumatic with the knowledge of a previous cervical surgery in the last 62 weeks.  Her collar was felt to be stable to be stable and it was removed and soft collar applied at the end of the case.  She was then turned to prone position.  A Wilson frame with sliding op  table was used, all pressure points well padded, PAS stockings were used.  The arms at the sides were well padded, 90/90 position.  Foley catheter was placed prior to her being turned to prone position.  She then underwent prep with DuraPrep solution from the mid dorsal spine to the mid sacral segment and draped in the usual manner, iodine Vi-Drape was used. Standard time-out protocol was carried out identifying the patient, expected procedure, and the length of the procedure and estimated blood loss was noted.  The patient then had infiltration of the expected area for incision extending from L3-4 to L4-5.  An incision was made through the skin and subcutaneous layers over the expected L3-4 and 4-5 levels, then extended superiorly to the L2 level and inferiorly to the L5 level. Electrocautery was used and sized lumbodorsal fascia on both sides of the spinous processes of L2, L3, L4, and L5, clamp was then placed on both sides of the spinous process of L4, at the L4-5 and at the L3-4 levels.  Intraoperative lateral radiographs have demonstrated the clamps on either side of the spinous processes of L3.  The electrocautery was then used to divide the interspinous ligament between L62 and L5, L3 and L4.  A marking pen was then used to mark these levels.  Cobbs were then used to elevate the paralumbar muscles bilaterally from L2-L5 carefully resecting these off the posterior elements of these level preserving the facet capsules at L2-3, L3-4, and L4-5 bilaterally.  In the operating room, x-ray was reviewed and demonstrated that the clamps as mentioned. This was the lateral radiograph of the lumbar spine.  The spinous processes of L4 and L3 were then resected in the midline and the inferior half of the spinous process of L2 was resected and the inferior one third of the spinous process of L5 was resected as well.  Leksell rongeur was then used to carefully thin central portions of the lamina of  L4 and L3, small portion of the inferior aspect of the lamina of L2 was resected bilaterally.  Soft tissues in the posterior aspect of the interlaminar areas were carefully resected, and these areas were debrided by using Cobb elevators and Leksell rongeurs.  Bleeders were controlled using electrocautery and bipolar electrocautery.  High-speed bur was then used to thin the posterior aspect of the lamina and central portions of the lamina of L4 and L3 using a 4-mm cutting bur.  Loupe magnification headlamp was used during these portions of the procedure. Attempts were made to use osteotomes where the osteotomes were felt to be dull, the patient's bone strong.  So, the bur was used to further thin the posterior aspect of the central portions of lamina of L4 and 3 and continued superiorly.  A 3-mm Kerrison was then able to be introduced beneath the inferior aspect of the lamina of L4, used  to resect the central portions of the lamina of L4.  Cottonoids were applied at the upper aspect of the neural arch of L4 and the neural arch were resected.  Continued dissection was carried up to the L3 level where the 3-mm Kerrisons were used to resect central portions of the L3 lamina centrally.  The ligamentum flavum at the L2-3 level was then resected bilaterally off the ventral aspect of the insertion site to the inferior aspect of lamina of L2.  Lateral recesses at the L2-3 level were decompressed with resection using 3-mm Kerrisons and 4-mm Kerrisons were necessary.  A D'Errico retractor was used to carefully retract the neural elements during this portion of the procedure.  Next, the lateral recesses were decompressed at the L3-4 level with resection of the lateral aspects of the central portions of the lamina further widening the laminotomy area.  Ligamentum flavum was found to be quite hypertrophic, required resection over the L3 nerve roots as they exited bilaterally.  Lateral recess at the 3-4  segment was carefully decompressed by resection of the medial aspect of the superior articular process of L4 and inferior articular process of L3, about 10-15% on both sides.  Continuing into the 4-5 level from superior to the inferior, similarly the central laminotomy area was further widened by resection of bone along the medial aspect of the L4-5 facet bilaterally.  With resection of the central portions of the ligamentum flavum at the 4-5 level, then the lateral recess and the foraminotomy over the five roots on both sides were felt to be quite tight at this level, so the operating room microscope was sterilely draped and brought into the field.  Under the operating room microscope, then procedure was continued with resection of the ligamentum flavum and careful retraction of the thecal sac.  A foraminotomy was performed over both L5 nerve roots.  Once the nerve roots were observed to be exiting without nerve compression at the foramens, then osteotomy was carried out over the medial aspect of the right L4-5 facet resecting on both sides 10 or 15% along the medial side here decompressing the right L5 nerve root. Synovial cyst material was found to be present overlying the dorsal and lateral aspects of the thecal sac and the L5 nerve root.  Under the operating microscope, the synovial cyst material was found to be adherent to the thecal sac, required freeing up using Penfield 4 as well as the nerve hook carefully resecting using micropituitary rongeurs.  At least 90% of synovial cyst was able to be resected off the area of the 5 root and lateral thecal sac decompressing the lateral canal quite nicely.  The superior articular process of L5 was then carefully resected as was the reflected portion of the ligamentum flavum overlying the L4 nerve root and the L4 root was decompressed on the right side. Similarly, this was carried up to the next level of the L3-4 level and decompression was  carried out of the lateral recess and resection of the hypertrophic ligamentum flavum.  The decompression was carried out to the point which a hockey stick neural probe could be passed at the L3 neural foramen demonstrating its patency and further compression related to flavum or hypertrophic joint here.  The 2-3 level required only resection of hypertrophic flavum.  The 3 root in the area of the lateral recess appeared to be well decompressed along the resection of the hypertrophic flavum.  Changing sides then to the right side, the left side  was then further decompressed.  Under the operating room microscope, similar to the that of the right with decompression of the lateral recess of the 3-4 level excising hypertrophic ligamentum flavum and resecting about 5 or 10% of medial articular facet.  The 4 root was observed to be exiting out at the 4-5 neural foramen with hypertrophic flavum present overlying this and this was resected off the superior articular process of the L5 on the left side.  The foraminotomy performed over the 5 root on the left side and then osteotome was used to resect bone on the medial aspect of the L4-5 facets.  Then using D'Errico, carefully tracked the thecal sac at L5 root, a 3-mm Kerrison was used to resect a small portion of the medial aspect of the superior articular process of L5, reflected flavum here decompressing the lateral recess at the L5 nerve root.  This completed the decompression.  Bonewax was applied to bleeding cancellous bone surfaces in the areas of resected facets bone.  Hockey stick neural probe was used to probe each of the neural foramen at L3, L4, and L5 and they have proven their patency under the operating microscope.  Bleeders were controlled using bipolar electrocautery where necessary.  Irrigation was carried out.  At the end of case, there was no active bleeding present.  Medium Hemovac drain was placed in the depth of the incision  and overlying the posterior aspect of the lamina of L2 and L1 superiorly exiting out the left lower lumbar spine.  The patient then had the paralumbar muscles reapproximated in the midline with interrupted #1 Vicryl sutures loosely.  The lumbodorsal fascia was reapproximated to the spinous process of the interspinous ligaments using #1 Vicryl suture at the L5 level and at the L2 level.  The patient then had approximation of the lumbodorsal fascia in the midline with interrupted #1 Vicryl sutures.  Deep subcu layers were then approximated with interrupted 0-0 and 2-0 Vicryl sutures used to close the superficial layers and the skin was closed with a running subcu stitch of 4-0 Vicryl.  Dermabond was then applied and then a Mepilex bandage. All instrument, sponge counts were correct.  The patient was then returned to a supine position, reactivated, extubated, and returned to the recovery room in satisfactory condition.  All instrument and sponge counts were correct.  PHYSICIAN ASSISTANT'S RESPONSIBILITIES:  Maud Deed performed duties of Designer, television/film set, and she was present from the beginning of the case to the end of the case.  She assisted with delicate retraction of neural elements and sectioning of neural structures both using loupe magnification and under the operating room microscope.  At the end of the case, she performed closure from the paraspinal muscle layer to the skin and application of dressing.     Kerrin Champagne, M.D.     JEN/MEDQ  D:  02/18/2011  T:  02/19/2011  Job:  [id number]  Electronically Signed by Vira Browns M.D. on 02/21/2011 08:06:34 PM

## 2011-02-25 ENCOUNTER — Encounter: Payer: Self-pay | Admitting: Sports Medicine

## 2011-02-25 DIAGNOSIS — Z765 Malingerer [conscious simulation]: Secondary | ICD-10-CM | POA: Insufficient documentation

## 2011-04-30 ENCOUNTER — Ambulatory Visit: Payer: Medicare Other | Admitting: Physical Therapy

## 2011-05-01 ENCOUNTER — Encounter: Payer: Medicare Other | Admitting: Physical Therapy

## 2011-05-09 ENCOUNTER — Other Ambulatory Visit: Payer: Self-pay | Admitting: Specialist

## 2011-05-09 DIAGNOSIS — M545 Low back pain, unspecified: Secondary | ICD-10-CM

## 2011-05-09 DIAGNOSIS — IMO0002 Reserved for concepts with insufficient information to code with codable children: Secondary | ICD-10-CM

## 2011-05-11 ENCOUNTER — Emergency Department (HOSPITAL_COMMUNITY)
Admission: EM | Admit: 2011-05-11 | Discharge: 2011-05-11 | Disposition: A | Discharge status: Home / Self Care | Payer: Medicare Other | Attending: Emergency Medicine | Admitting: Emergency Medicine

## 2011-05-11 ENCOUNTER — Emergency Department (HOSPITAL_COMMUNITY): Payer: Medicare Other

## 2011-05-11 DIAGNOSIS — Y92009 Unspecified place in unspecified non-institutional (private) residence as the place of occurrence of the external cause: Secondary | ICD-10-CM | POA: Insufficient documentation

## 2011-05-11 DIAGNOSIS — S7000XA Contusion of unspecified hip, initial encounter: Secondary | ICD-10-CM | POA: Insufficient documentation

## 2011-05-11 DIAGNOSIS — S8000XA Contusion of unspecified knee, initial encounter: Secondary | ICD-10-CM | POA: Insufficient documentation

## 2011-05-11 DIAGNOSIS — W19XXXA Unspecified fall, initial encounter: Secondary | ICD-10-CM | POA: Insufficient documentation

## 2011-05-11 DIAGNOSIS — E7800 Pure hypercholesterolemia, unspecified: Secondary | ICD-10-CM | POA: Insufficient documentation

## 2011-05-11 DIAGNOSIS — E785 Hyperlipidemia, unspecified: Secondary | ICD-10-CM | POA: Insufficient documentation

## 2011-05-11 DIAGNOSIS — I10 Essential (primary) hypertension: Secondary | ICD-10-CM | POA: Insufficient documentation

## 2011-05-12 ENCOUNTER — Ambulatory Visit
Admission: RE | Admit: 2011-05-12 | Discharge: 2011-05-12 | Disposition: A | Discharge status: Home / Self Care | Payer: Medicare Other | Source: Ambulatory Visit | Attending: Specialist | Admitting: Specialist

## 2011-05-12 DIAGNOSIS — M545 Low back pain, unspecified: Secondary | ICD-10-CM

## 2011-05-12 DIAGNOSIS — IMO0002 Reserved for concepts with insufficient information to code with codable children: Secondary | ICD-10-CM

## 2011-05-12 MED ORDER — GADOBENATE DIMEGLUMINE 529 MG/ML IV SOLN
11.0000 mL | Freq: Once | INTRAVENOUS | Status: AC | PRN
  Administered 2011-05-12: 11 mL via INTRAVENOUS

## 2011-06-11 LAB — CBC
HCT: 35.5 — ABNORMAL LOW
Hemoglobin: 12.1
WBC: 8.6

## 2011-06-11 LAB — POCT I-STAT, CHEM 8
BUN: 14
Calcium, Ion: 1.05 — ABNORMAL LOW
Chloride: 107
Glucose, Bld: 93
Potassium: 5

## 2011-06-11 LAB — POCT CARDIAC MARKERS
CKMB, poc: <1 — ABNORMAL LOW
Myoglobin, poc: 32.1
Myoglobin, poc: 36.5
Operator id: 295021

## 2011-06-12 LAB — DIFFERENTIAL
Basophils Absolute: 0.1
Basophils Relative: 1
Eosinophils Absolute: 0.3
Eosinophils Relative: 4
Lymphocytes Relative: 42
Lymphs Abs: 3.2
Monocytes Absolute: 0.5
Monocytes Absolute: 0.6
Monocytes Relative: 7
Neutro Abs: 3
Neutrophils Relative %: 46
Neutrophils Relative %: 55

## 2011-06-12 LAB — RAPID URINE DRUG SCREEN, HOSP PERFORMED
Barbiturates: NOT DETECTED
Benzodiazepines: POSITIVE — AB
Cocaine: NOT DETECTED
Opiates: POSITIVE — AB

## 2011-06-12 LAB — COMPREHENSIVE METABOLIC PANEL
ALT: 11
AST: 13
Albumin: 3.1 — ABNORMAL LOW
Albumin: 3.6
Alkaline Phosphatase: 48
BUN: 8
Calcium: 8.7
Chloride: 111
Creatinine, Ser: 0.67
GFR calc Af Amer: >60
GFR calc non Af Amer: >60
Glucose, Bld: 87
Potassium: 3.6
Sodium: 141
Total Bilirubin: 0.9
Total Protein: 5.6 — ABNORMAL LOW

## 2011-06-12 LAB — OCCULT BLOOD X 1 CARD TO LAB, STOOL: Fecal Occult Bld: POSITIVE

## 2011-06-12 LAB — URINE MICROSCOPIC-ADD ON

## 2011-06-12 LAB — CBC
HCT: 35.3 — ABNORMAL LOW
HCT: 37.3
Hemoglobin: 12.6
MCHC: 33.3
MCHC: 34.9
MCV: 91.3
MCV: 91.3
MCV: 92
Platelets: 257
Platelets: 274
Platelets: 283
RBC: 4.06
RDW: 14.7
RDW: 13.8
WBC: 6.6
WBC: 7

## 2011-06-12 LAB — POCT I-STAT, CHEM 8
Calcium, Ion: 1.14
Chloride: 107
Glucose, Bld: 84
HCT: 37
TCO2: 24

## 2011-06-12 LAB — URINALYSIS, ROUTINE W REFLEX MICROSCOPIC
Bilirubin Urine: NEGATIVE
Glucose, UA: NEGATIVE
Glucose, UA: NEGATIVE
Hgb urine dipstick: NEGATIVE
Hgb urine dipstick: NEGATIVE
Ketones, ur: NEGATIVE
Protein, ur: NEGATIVE
Protein, ur: NEGATIVE
Specific Gravity, Urine: 1.014
pH: 6

## 2011-06-12 LAB — URINE CULTURE
Colony Count: NO GROWTH
Special Requests: NEGATIVE

## 2011-06-12 LAB — ACETAMINOPHEN LEVEL: Acetaminophen (Tylenol), Serum: <10 — ABNORMAL LOW

## 2011-06-12 LAB — PROTIME-INR: INR: 0.9

## 2011-06-16 LAB — CBC
HCT: 38.3
MCV: 89.7
Platelets: 260
RDW: 13.6
WBC: 5.9

## 2011-06-16 LAB — URINALYSIS, ROUTINE W REFLEX MICROSCOPIC
Ketones, ur: NEGATIVE
Nitrite: NEGATIVE
Specific Gravity, Urine: 1.01
pH: 7

## 2011-06-16 LAB — DIFFERENTIAL
Eosinophils Relative: 6 — ABNORMAL HIGH
Lymphocytes Relative: 38
Lymphs Abs: 2.2
Monocytes Absolute: 0.5
Monocytes Relative: 9
Neutro Abs: 2.7

## 2011-06-16 LAB — POCT CARDIAC MARKERS
CKMB, poc: 1.5
Myoglobin, poc: 49.9
Troponin i, poc: <0.05
Troponin i, poc: <0.05

## 2011-06-16 LAB — COMPREHENSIVE METABOLIC PANEL
AST: 24
Albumin: 3.7
BUN: 12
Creatinine, Ser: 0.8
GFR calc Af Amer: >60
Total Protein: 6.3

## 2011-06-23 ENCOUNTER — Ambulatory Visit: Payer: Medicare Other | Admitting: Physical Therapy

## 2011-07-21 ENCOUNTER — Ambulatory Visit: Payer: Medicare Other | Admitting: Physical Therapy

## 2011-07-24 ENCOUNTER — Ambulatory Visit: Payer: Medicare Other | Attending: Anesthesiology | Admitting: Rehabilitation

## 2011-08-19 ENCOUNTER — Ambulatory Visit: Payer: Medicare Other | Attending: Anesthesiology | Admitting: Physical Therapy

## 2011-08-19 DIAGNOSIS — M545 Low back pain, unspecified: Secondary | ICD-10-CM | POA: Insufficient documentation

## 2011-08-19 DIAGNOSIS — IMO0001 Reserved for inherently not codable concepts without codable children: Secondary | ICD-10-CM | POA: Insufficient documentation

## 2011-08-19 DIAGNOSIS — M2569 Stiffness of other specified joint, not elsewhere classified: Secondary | ICD-10-CM | POA: Insufficient documentation

## 2011-08-19 DIAGNOSIS — M542 Cervicalgia: Secondary | ICD-10-CM | POA: Insufficient documentation

## 2011-11-28 ENCOUNTER — Emergency Department (HOSPITAL_COMMUNITY)
Admission: EM | Admit: 2011-11-28 | Discharge: 2011-11-29 | Disposition: A | Discharge status: Home / Self Care | Payer: Medicare Other | Attending: Emergency Medicine | Admitting: Emergency Medicine

## 2011-11-28 ENCOUNTER — Emergency Department (HOSPITAL_COMMUNITY): Payer: Medicare Other

## 2011-11-28 ENCOUNTER — Encounter (HOSPITAL_COMMUNITY): Payer: Self-pay | Admitting: Emergency Medicine

## 2011-11-28 DIAGNOSIS — F411 Generalized anxiety disorder: Secondary | ICD-10-CM | POA: Insufficient documentation

## 2011-11-28 DIAGNOSIS — F3289 Other specified depressive episodes: Secondary | ICD-10-CM | POA: Insufficient documentation

## 2011-11-28 DIAGNOSIS — S42301A Unspecified fracture of shaft of humerus, right arm, initial encounter for closed fracture: Secondary | ICD-10-CM

## 2011-11-28 DIAGNOSIS — M810 Age-related osteoporosis without current pathological fracture: Secondary | ICD-10-CM | POA: Insufficient documentation

## 2011-11-28 DIAGNOSIS — E7800 Pure hypercholesterolemia, unspecified: Secondary | ICD-10-CM | POA: Insufficient documentation

## 2011-11-28 DIAGNOSIS — Z96649 Presence of unspecified artificial hip joint: Secondary | ICD-10-CM | POA: Insufficient documentation

## 2011-11-28 DIAGNOSIS — M79609 Pain in unspecified limb: Secondary | ICD-10-CM | POA: Insufficient documentation

## 2011-11-28 DIAGNOSIS — I10 Essential (primary) hypertension: Secondary | ICD-10-CM | POA: Insufficient documentation

## 2011-11-28 DIAGNOSIS — S42293A Other displaced fracture of upper end of unspecified humerus, initial encounter for closed fracture: Secondary | ICD-10-CM | POA: Insufficient documentation

## 2011-11-28 DIAGNOSIS — M25519 Pain in unspecified shoulder: Secondary | ICD-10-CM | POA: Insufficient documentation

## 2011-11-28 DIAGNOSIS — W010XXA Fall on same level from slipping, tripping and stumbling without subsequent striking against object, initial encounter: Secondary | ICD-10-CM | POA: Insufficient documentation

## 2011-11-28 DIAGNOSIS — F329 Major depressive disorder, single episode, unspecified: Secondary | ICD-10-CM | POA: Insufficient documentation

## 2011-11-28 HISTORY — DX: Anxiety disorder, unspecified: F41.9

## 2011-11-28 HISTORY — DX: Other chronic pain: G89.29

## 2011-11-28 HISTORY — DX: Major depressive disorder, single episode, unspecified: F32.9

## 2011-11-28 HISTORY — DX: Essential (primary) hypertension: I10

## 2011-11-28 HISTORY — DX: Pure hypercholesterolemia, unspecified: E78.00

## 2011-11-28 HISTORY — DX: Depression, unspecified: F32.A

## 2011-11-28 MED ORDER — MORPHINE SULFATE 4 MG/ML IJ SOLN: 4.0000 mg | Freq: Once | INTRAMUSCULAR | Status: DC

## 2011-11-28 MED ORDER — OXYCODONE-ACETAMINOPHEN 5-325 MG PO TABS
2.0000 | ORAL_TABLET | Freq: Once | ORAL | Status: AC
  Administered 2011-11-28: 2 via ORAL
  Filled 2011-11-28: qty 2

## 2011-11-28 NOTE — ED Notes (Signed)
[id number]<AV> Expected date:<BR> Expected time:10:21 PM<BR> Means of arrival:<BR> Comments:<BR> M10 - 60yoF Tripped, right shoulder pain/injury

## 2011-11-28 NOTE — ED Notes (Signed)
Patient transported to X-ray 

## 2011-11-28 NOTE — ED Notes (Signed)
Pt had fallen over vacum, and has right shoulder pain, CMS WNL

## 2011-11-28 NOTE — ED Notes (Signed)
Pt has received 250 mcg of Fentanyl prior to arrival.

## 2011-11-28 NOTE — ED Provider Notes (Signed)
History     CSN: [id number]  Arrival date & time 11/28/11  2231   First MD Initiated Contact with Patient 11/28/11 2251      Chief Complaint  Patient presents with  . Shoulder Injury    fall over vacume.  Right shoulder pain.    (Consider location/radiation/quality/duration/timing/severity/associated sxs/prior treatment) HPI Patient is a 61 year old female who presents today complaining of right shoulder pain after tripping over a vacuum cleaner. Patient's history of osteoporosis as well as right rotator cuff repair. She denies any other injuries associated with her fall. She complains of 10 out of 10 right arm pain. This is worse with movement and better if she remains completely still. Patient is neurovascularly intact distal to her injury. She has not taken anything for pain prior to arrival. There are no other associated or modifying factors. Past Medical History  Diagnosis Date  . Anxiety   . Depression   . Hypertension   . High cholesterol   . Chronic pain   . Osteoporosis     Past Surgical History  Procedure Date  . Right femur replacement   . Left hip replacement   . Rotator cuff repair   . Neck surgery     plates and screws  . Back surgery     History reviewed. No pertinent family history.  History  Substance Use Topics  . Smoking status: Not on file  . Smokeless tobacco: Not on file  . Alcohol Use:     OB History    Grav Para Term Preterm Abortions TAB SAB Ect Mult Living                  Review of Systems  Constitutional: Negative.   HENT: Negative.   Eyes: Negative.   Cardiovascular: Negative.   Gastrointestinal: Negative.   Genitourinary: Negative.   Musculoskeletal:       See HPI  Skin: Negative.   Neurological: Negative.   Hematological: Negative.   Psychiatric/Behavioral: Negative.   All other systems reviewed and are negative.    Allergies  Cyclobenzaprine hcl; Hydrocodone; Penicillins; and Propoxyphene n-acetaminophen  Home  Medications   Current Outpatient Rx  Name Route Sig Dispense Refill  . AMPHETAMINE-DEXTROAMPHETAMINE 20 MG PO TABS Oral Take 20 mg by mouth 3 (three) times daily.    . ASPIRIN 81 MG PO CHEW Oral Chew 81 mg by mouth daily.      Marland Kitchen CALCIUM + D 600-200 MG-UNIT PO TABS Oral Take 1 tablet by mouth 2 (two) times daily.      Marland Kitchen CITALOPRAM HYDROBROMIDE 40 MG PO TABS Oral Take 60 mg by mouth daily.    Marland Kitchen CLONIDINE HCL 0.1 MG PO TABS Oral Take 0.1 mg by mouth 4 (four) times daily as needed. OPIATE WITHDRAWAL    . CYPROHEPTADINE HCL 4 MG PO TABS Oral Take 4 mg by mouth 3 (three) times daily.    Marland Kitchen EST ESTROGENS-METHYLTEST 1.25-2.5 MG PO TABS Oral Take 1 tablet by mouth 3 (three) times a week.    Marland Kitchen LORAZEPAM 1 MG PO TABS Oral Take 1 mg by mouth at bedtime. As needed for axenity, prescribed by psych    . MORPHINE SULFATE 15 MG PO TABS Oral Take 15 mg by mouth 3 (three) times daily.    Marland Kitchen NORTRIPTYLINE HCL 50 MG PO CAPS Oral Take 50 mg by mouth daily.    Marland Kitchen PREGABALIN 50 MG PO CAPS Oral Take 50 mg by mouth 3 (three) times daily.    Marland Kitchen  ROPINIROLE HCL 2 MG PO TABS Oral Take 2 mg by mouth at bedtime.    Marland Kitchen SIMVASTATIN 20 MG PO TABS Oral Take 20 mg by mouth daily.      Marland Kitchen TEMAZEPAM 30 MG PO CAPS Oral Take 30 mg by mouth at bedtime as needed.      . TRAZODONE HCL 100 MG PO TABS Oral Take 500 mg by mouth at bedtime. As prescribed by psych      BP 126/89  Pulse 81  Temp(Src) 97.9 F (36.6 C) (Oral)  Resp 24  SpO2 96%  Physical Exam  Nursing note and vitals reviewed. GEN: Well-developed, well-nourished female in no distress, very uncomfortable appearing HEENT: Atraumatic, normocephalic.  EYES: PERRLA BL NECK: Trachea midline, no meningismus CV: regular rate and rhythm. No murmurs, rubs, or gallops PULM: No respiratory distress.  No crackles, wheezes, or rales. GI: soft, non-tender. No guarding, rebound, or tenderness. + bowel sounds  GU: deferred Neuro: cranial nerves 2-12 intact, no abnormalities of strength  or sensation, A and O x 3 MSK: Patient with significant pain on palpation of the proximal right humerus. No obvious deformity the shoulder joint. Radial pulse is intact. Patient denies any neurologic deficits. Skin: No rashes petechiae, purpura, or jaundice Psych: Patient is very tearful and anxious  ED Course  Procedures (including critical care time)  Labs Reviewed - No data to display Dg Shoulder Right  11/28/2011  *RADIOLOGY REPORT*  Clinical Data: Status post fall; severe right shoulder pain.  RIGHT SHOULDER - 2+ VIEW  Comparison: Chest radiograph performed 02/17/2011  Findings: There is a comminuted fracture involving the right humeral head and neck, with multiple mildly displaced greater tuberosity fragments.  Mild impaction is noted at the fracture site.  The humeral head remains seated at the glenoid fossa.  No additional fractures are seen.  The right acromioclavicular joint demonstrates mild degenerative change.  The right scapula appears intact.  Mild right basilar airspace opacity likely reflects atelectasis. No significant soft tissue abnormalities are characterized on radiograph.  Cervical spinal fusion hardware is noted.  IMPRESSION:  1.  Comminuted fracture involving the right humeral head and neck, with mild impaction at the fracture site, and mildly displaced greater tuberosity fragments.  Humeral head remains seated at the glenoid fossa. 2.  Mild right basilar airspace opacity likely reflects atelectasis.  Original Report Authenticated By: Tonia Ghent, M.D.     1. Closed fracture of right humerus       MDM  Patient was evaluated by myself and plain film was ordered. Patient was given 2 tabs Percocet for pain.  Plain films returned showing closed comminuted fracture of the right humerus. I spoke with Dr. August Saucer who was covering for the patient's normal orthopedist Dr. Otelia Sergeant.  He recommended placing the patient in a shoulder mobilizer and discharging home with instructions to  followup in the office on Monday. Patient does have Oramorph 15 mg 3 times a day normally given her prior back injury. While she is followed at a pain clinic she presented on a Friday night with a new acute and very painful fracture. Patient was treated here with Dilaudid as well as Valium. I was comfortable with discharging her home with pain medication given this acute care situation. Patient was given a prescription for Percocet as well as Valium. Valium was given as a muscle relaxer.  Patient was discharged home in good condition.        Cyndra Numbers, MD 11/29/11 [phone]

## 2011-11-29 MED ORDER — HYDROMORPHONE HCL PF 1 MG/ML IJ SOLN
1.0000 mg | Freq: Once | INTRAMUSCULAR | Status: DC
  Filled 2011-11-29 (×2): qty 1

## 2011-11-29 MED ORDER — DIAZEPAM 5 MG PO TABS
10.0000 mg | ORAL_TABLET | Freq: Once | ORAL | Status: AC
  Administered 2011-11-29: 10 mg via ORAL
  Filled 2011-11-29: qty 2

## 2011-11-29 MED ORDER — OXYCODONE-ACETAMINOPHEN 5-325 MG PO TABS: 2.0000 | ORAL_TABLET | ORAL | Status: AC | PRN

## 2011-11-29 MED ORDER — MORPHINE SULFATE CR 15 MG PO TB12: 15.0000 mg | ORAL_TABLET | Freq: Two times a day (BID) | ORAL | Status: DC

## 2011-11-29 MED ORDER — DIAZEPAM 5 MG PO TABS: 5.0000 mg | ORAL_TABLET | Freq: Three times a day (TID) | ORAL | Status: AC | PRN

## 2011-11-29 MED ORDER — HYDROMORPHONE HCL PF 2 MG/ML IJ SOLN
2.0000 mg | Freq: Once | INTRAMUSCULAR | Status: AC
  Administered 2011-11-29: 2 mg via INTRAMUSCULAR

## 2011-11-29 MED ORDER — HYDROMORPHONE HCL PF 2 MG/ML IJ SOLN
4.0000 mg | Freq: Once | INTRAMUSCULAR | Status: AC
  Administered 2011-11-29: 4 mg via INTRAMUSCULAR
  Filled 2011-11-29: qty 2

## 2011-11-29 MED ORDER — DIAZEPAM 5 MG PO TABS: 5.0000 mg | ORAL_TABLET | Freq: Three times a day (TID) | ORAL | Status: DC | PRN

## 2011-11-29 NOTE — ED Notes (Signed)
Pt came out to RN station and was talking with EMS provider.  He had advised that if there is not a medical necessity that the patient might be billed for the service.  Pt states that that is fine, and wanted security to call for a cab.  Pt states, "My husband will pay for the cab."  Charge RN also with patient during conversation.

## 2011-11-29 NOTE — ED Notes (Signed)
Pt ambulated out to RN station and asked where she could smoke a cigarrette. Pt advised that this is a smoke free campus.  Pt asked if she has ability to find a ride home. Pt states that [name] has always taken her home.  I advised her that at this time, there is not a medical necessity for her to go by ambulance.  PT states, "that sitting in the room for three hours in pain is necessity enough."

## 2011-11-29 NOTE — Discharge Instructions (Signed)
Upper Extremity Fracture       Broken bones take weeks to heal and require protection and proper follow-up. The broken ends must be lined up correctly and kept perfectly still for proper healing. Do not remove the splint, immobilizer, or cast that has been applied to treat your injury until instructed to do so by your caregiver. This is the most important part of your treatment. Other measures to treat fractures include:   Keeping the injured limb at rest and elevated as recommended by your caregiver. This will help reduce pain and swelling. Use pillows to rest and elevate your arm on at night.   Applying ice packs to your fracture site frequently for the next 2 to 3 days.   Pain medicine is often prescribed in the first days after a fracture. Only take over-the-counter or prescription medicines for pain, discomfort, or fever as directed by your caregiver.  Proper follow-up care is very important, so call your caregiver for an appointment as soon as possible. Follow-up X-rays are generally recommended to monitor healing.   SEEK IMMEDIATE MEDICAL CARE IF:   You notice increasing pain or pressure in the injured arm or hand, or if it your extremity becomes cold, numb, or pale.   MAKE SURE YOU:   Understand these instructions.   Will watch your condition.   Will get help right away if you are not doing well or get worse.  Document Released: 10/09/2004 Document Revised: 08/21/2011 Document Reviewed: 10/04/2008   [employer] Patient Information 2012 [employer].

## 2011-11-29 NOTE — ED Notes (Signed)
Charge nurse Dois Davenport bought EMS to talk with patient about medical necessity for using [name] services home. EMS staff explained to patient that he could take her home but she may be responsible for the bill. Patient then reported that she would catch a cab home and her husband would pay for it when she get's home.

## 2011-11-29 NOTE — ED Notes (Signed)
Patient discharge via ambulatory steady gait. Respirations equal and unlabored. Skin warm and dry. No acute distress noted. 

## 2011-12-01 ENCOUNTER — Other Ambulatory Visit: Payer: Self-pay | Admitting: Family Medicine

## 2011-12-01 ENCOUNTER — Ambulatory Visit
Admission: RE | Admit: 2011-12-01 | Discharge: 2011-12-01 | Disposition: A | Discharge status: Home / Self Care | Payer: Medicare Other | Source: Ambulatory Visit | Attending: Family Medicine | Admitting: Family Medicine

## 2011-12-01 DIAGNOSIS — T148XXA Other injury of unspecified body region, initial encounter: Secondary | ICD-10-CM

## 2012-02-13 ENCOUNTER — Encounter (HOSPITAL_COMMUNITY): Payer: Self-pay | Admitting: Pharmacy Technician

## 2012-02-17 NOTE — Pre-Procedure Instructions (Signed)
20 IMOGENE GRAVELLE  02/17/2012   Your procedure is scheduled on:  Friday, June 14th.  Report to [facility] at 9:30AM.  Call this number if you have problems the morning of surgery: [phone]   Remember:   Do not eat food:After Midnight.  May have clear liquids: up to 4 Hours before arrival. (5:30am)  Clear liquids include soda, tea, black coffee, apple or grape juice, broth.   Take these medicines the morning of surgery with A SIP OF WATER: Citalopram (Celexa) Morphine SR.  May take Lorezepam ( Ativan)  and Methocarbamol (Robaxin).  May use Albuterol and bring it in with you on the day of surgery. Stop Advil and Fish Oil and any Herbal Products 7 days prior to surgery.   Do not wear jewelry, make-up or nail polish.  Do not wear lotions, powders, or perfumes. You may wear deodorant.  Do not shave 48 hours prior to surgery. Men may shave face and neck.  Do not bring valuables to the hospital.  Contacts, dentures or bridgework may not be worn into surgery.  Leave suitcase in the car. After surgery it may be brought to your room.  For patients admitted to the hospital, checkout time is 11:00 AM the day of discharge.   Patients discharged the day of surgery will not be allowed to drive home.  Name and phone number of your driver: NA  Special Instructions: CHG Shower Use Special Wash: 1/2 bottle night before surgery and 1/2 bottle morning of surgery.   Please read over the following fact sheets that you were given: Pain Booklet, Coughing and Deep Breathing, Blood Transfusion Information, MRSA Information and Surgical Site Infection Prevention and Incentive Spirometry.

## 2012-02-18 ENCOUNTER — Inpatient Hospital Stay (HOSPITAL_COMMUNITY): Admission: RE | Admit: 2012-02-18 | Payer: Medicare Other | Source: Ambulatory Visit

## 2012-02-18 ENCOUNTER — Encounter (HOSPITAL_COMMUNITY): Payer: Self-pay | Admitting: *Deleted

## 2012-02-23 ENCOUNTER — Inpatient Hospital Stay (HOSPITAL_COMMUNITY): Admission: RE | Admit: 2012-02-23 | Payer: Medicare Other | Source: Ambulatory Visit

## 2012-02-26 ENCOUNTER — Inpatient Hospital Stay (HOSPITAL_COMMUNITY): Admission: RE | Admit: 2012-02-26 | Discharge: 2012-02-26 | Payer: Medicare Other | Source: Ambulatory Visit

## 2012-02-26 MED ORDER — CHLORHEXIDINE GLUCONATE 4 % EX LIQD: 60.0000 mL | Freq: Once | CUTANEOUS | Status: DC

## 2012-02-26 MED ORDER — VANCOMYCIN HCL IN DEXTROSE 1-5 GM/200ML-% IV SOLN
1000.0000 mg | INTRAVENOUS | Status: AC
  Administered 2012-02-27: 1000 mg via INTRAVENOUS
  Filled 2012-02-26: qty 200

## 2012-02-26 NOTE — H&P (Addendum)
[name] is an 61 y.o. female.   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  She had a fall about 12 weeks ago tripping over a vacuum cleaner.  She was diagnosed with a right proximal humerus fracture and  treated nonsurgically.  She has been in a sling.   Prior to this injury she did not have a fully functional shoulder,as she had had previous rotator cuff repair on this side and at baseline prior to this injury she had to use her other arm in order to raise this arm past 90 of forward flexion. She describes her pain currently is moderate to severe sharp throbbing pain.  It's worse with any activity better with rest and heat.  She has been taking Percocet and Valium. She has been on chronic pain medicines for other issues as well.   Right shoulder with impacted humeral  head and neck fracture and there is rotation to abduction.  The inferior aspect of the fracture line does enter into the inferior aspect of the glenohumeral joint.  The greater tuberosity is off and located over the superior aspect of the humeral head.  . Pt is S/P C7 corpectomy with fusion of C5-T1 01/08/11. Her CT scan indicated nonunion at C7-T1 with fractured screw right T1 with prominence of app. 3 mm   Past Medical History  Diagnosis Date  . Anxiety   . Depression   . High cholesterol   . Chronic pain   . Osteoporosis   . Hypertension     resolved whn she stopped drinking alcohol  . Asthma   . GERD (gastroesophageal reflux disease)   . Constipation   . Osteopenia   . Mitral prolapse     dx as a child    Past Surgical History  Procedure Date  . Right femur replacement   . Left hip replacement   . Rotator cuff repair     right  . Neck surgery     plates and screws  . Back surgery   . Abdominal hysterectomy   . Wrist surgery     tendon repair from a dog bite- left  . Orif wrist fracture 1986    right    No family  history on file. Social History:  reports that she has been smoking.  She does not have any smokeless tobacco history on file. She reports that she does not drink alcohol or use illicit drugs.  Allergies:  Allergies  Allergen Reactions  . Cyclobenzaprine Hcl   . Hydrocodone   . Penicillins   . Propoxyphene-Acetaminophen     No prescriptions prior to admission    No results found for this or any previous visit (from the past 48 hour(s)). No results found.  Review of Systems  Constitutional: Negative.   HENT: Positive for neck pain.   Eyes: Negative.   Respiratory: Negative.   Cardiovascular: Negative.   Gastrointestinal: Negative.   Genitourinary: Negative.   Musculoskeletal: Positive for joint pain.  Skin: Negative.   Neurological: Positive for headaches.  Endo/Heme/Allergies: Negative.   Psychiatric/Behavioral: Negative.     There were no vitals taken for this visit. Physical Exam  Constitutional: She is oriented to person, place, and time. She appears well-developed and well-nourished.  HENT:  Head: Normocephalic and atraumatic.  Eyes: EOM are normal. Pupils are equal, round, and reactive to light.  Neck: Neck supple.  Cardiovascular: Normal rate and  regular rhythm.   Respiratory: Effort normal and breath sounds normal.  GI: Soft. Bowel sounds are normal.  Musculoskeletal:       tenderness about the proximal humerus.  With attempts at passive range of motion she has significant pain and some crepitance. neutral external rotation and up to about 40 forward elevation with a lot of pain.  Distally she is neurovascularly intact Finger abduction, adduction strength, normal wrist dorsiflexion, volar flexion intact.  Left arm including proximal motor is normal.    Neurological: She is alert and oriented to person, place, and time.  Skin: Skin is warm and dry.  Psychiatric: She has a normal mood and affect.     Assessment/Plan Displaced and impacted right proximal  humeral head and neck fracture, with failed conservative treatment. 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

## 2012-02-26 NOTE — Progress Notes (Signed)
Pt cancelled pre-op  lab appoint for 02/26/12.  Dr. Barbaraann Faster scheduler notified.

## 2012-02-27 ENCOUNTER — Encounter (HOSPITAL_COMMUNITY): Payer: Self-pay | Admitting: Critical Care Medicine

## 2012-02-27 ENCOUNTER — Inpatient Hospital Stay (HOSPITAL_COMMUNITY)
Admission: RE | Admit: 2012-02-27 | Discharge: 2012-02-29 | DRG: 483 | Disposition: A | Discharge status: Home Health | Payer: Medicare Other | Source: Ambulatory Visit | Attending: Specialist | Admitting: Specialist

## 2012-02-27 ENCOUNTER — Ambulatory Visit (HOSPITAL_COMMUNITY): Payer: Medicare Other

## 2012-02-27 ENCOUNTER — Encounter (HOSPITAL_COMMUNITY): Payer: Self-pay

## 2012-02-27 ENCOUNTER — Encounter (HOSPITAL_COMMUNITY): Payer: Self-pay | Admitting: Specialist

## 2012-02-27 ENCOUNTER — Encounter (HOSPITAL_COMMUNITY)
Admission: RE | Disposition: A | Discharge status: Home Health | Payer: Self-pay | Source: Ambulatory Visit | Attending: Specialist

## 2012-02-27 DIAGNOSIS — Z981 Arthrodesis status: Secondary | ICD-10-CM

## 2012-02-27 DIAGNOSIS — W010XXA Fall on same level from slipping, tripping and stumbling without subsequent striking against object, initial encounter: Secondary | ICD-10-CM | POA: Diagnosis present

## 2012-02-27 DIAGNOSIS — M949 Disorder of cartilage, unspecified: Secondary | ICD-10-CM | POA: Diagnosis present

## 2012-02-27 DIAGNOSIS — M899 Disorder of bone, unspecified: Secondary | ICD-10-CM | POA: Diagnosis present

## 2012-02-27 DIAGNOSIS — T84498A Other mechanical complication of other internal orthopedic devices, implants and grafts, initial encounter: Secondary | ICD-10-CM | POA: Diagnosis present

## 2012-02-27 DIAGNOSIS — K219 Gastro-esophageal reflux disease without esophagitis: Secondary | ICD-10-CM | POA: Diagnosis present

## 2012-02-27 DIAGNOSIS — J45909 Unspecified asthma, uncomplicated: Secondary | ICD-10-CM | POA: Diagnosis present

## 2012-02-27 DIAGNOSIS — F3289 Other specified depressive episodes: Secondary | ICD-10-CM | POA: Diagnosis present

## 2012-02-27 DIAGNOSIS — S42213A Unspecified displaced fracture of surgical neck of unspecified humerus, initial encounter for closed fracture: Principal | ICD-10-CM | POA: Diagnosis present

## 2012-02-27 DIAGNOSIS — Z967 Presence of other bone and tendon implants: Secondary | ICD-10-CM

## 2012-02-27 DIAGNOSIS — R911 Solitary pulmonary nodule: Secondary | ICD-10-CM | POA: Diagnosis present

## 2012-02-27 DIAGNOSIS — Y831 Surgical operation with implant of artificial internal device as the cause of abnormal reaction of the patient, or of later complication, without mention of misadventure at the time of the procedure: Secondary | ICD-10-CM | POA: Diagnosis present

## 2012-02-27 DIAGNOSIS — F411 Generalized anxiety disorder: Secondary | ICD-10-CM | POA: Diagnosis present

## 2012-02-27 DIAGNOSIS — M810 Age-related osteoporosis without current pathological fracture: Secondary | ICD-10-CM | POA: Diagnosis present

## 2012-02-27 DIAGNOSIS — F329 Major depressive disorder, single episode, unspecified: Secondary | ICD-10-CM | POA: Diagnosis present

## 2012-02-27 HISTORY — DX: Nonrheumatic mitral (valve) prolapse: I34.1

## 2012-02-27 HISTORY — DX: Constipation, unspecified: K59.00

## 2012-02-27 HISTORY — DX: Other specified disorders of bone density and structure, unspecified site: M85.80

## 2012-02-27 HISTORY — PX: HARDWARE REMOVAL: SHX979

## 2012-02-27 HISTORY — DX: Gastro-esophageal reflux disease without esophagitis: K21.9

## 2012-02-27 HISTORY — DX: Unspecified asthma, uncomplicated: J45.909

## 2012-02-27 HISTORY — PX: SHOULDER HEMI-ARTHROPLASTY: SHX5049

## 2012-02-27 HISTORY — PX: OTHER SURGICAL HISTORY: SHX169

## 2012-02-27 LAB — BASIC METABOLIC PANEL
BUN: 19 mg/dL (ref 6–23)
CO2: 21 mEq/L (ref 19–32)
Calcium: 9.1 mg/dL (ref 8.4–10.5)
Chloride: 104 mEq/L (ref 96–112)
Creatinine, Ser: 0.76 mg/dL (ref 0.50–1.10)
Glucose, Bld: 100 mg/dL — ABNORMAL HIGH (ref 70–99)

## 2012-02-27 LAB — CBC
HCT: 37.1 % (ref 36.0–46.0)
MCH: 29.9 pg (ref 26.0–34.0)
MCV: 88.1 fL (ref 78.0–100.0)
RBC: 4.21 MIL/uL (ref 3.87–5.11)
WBC: 5.8 10*3/uL (ref 4.0–10.5)

## 2012-02-27 LAB — ABO/RH: ABO/RH(D): O POS

## 2012-02-27 SURGERY — HEMIARTHROPLASTY, SHOULDER
Anesthesia: General | Site: Shoulder | Laterality: Right | Wound class: Clean

## 2012-02-27 MED ORDER — SODIUM CHLORIDE 0.9 % IJ SOLN: 9.0000 mL | INTRAMUSCULAR | Status: DC | PRN

## 2012-02-27 MED ORDER — ASPIRIN 81 MG PO CHEW
81.0000 mg | CHEWABLE_TABLET | Freq: Every day | ORAL | Status: DC
Start: 2012-02-28 — End: 2012-02-29
  Administered 2012-02-28 – 2012-02-29 (×2): 81 mg via ORAL
  Filled 2012-02-27 (×2): qty 1

## 2012-02-27 MED ORDER — BISACODYL 10 MG RE SUPP: 10.0000 mg | Freq: Every day | RECTAL | Status: DC | PRN

## 2012-02-27 MED ORDER — CITALOPRAM HYDROBROMIDE 40 MG PO TABS
40.0000 mg | ORAL_TABLET | Freq: Every day | ORAL | Status: DC
  Administered 2012-02-28 – 2012-02-29 (×2): 40 mg via ORAL
  Filled 2012-02-27 (×2): qty 1

## 2012-02-27 MED ORDER — KCL IN DEXTROSE-NACL 20-5-0.45 MEQ/L-%-% IV SOLN
INTRAVENOUS | Status: DC
  Administered 2012-02-28: 06:00:00 via INTRAVENOUS
  Filled 2012-02-27 (×5): qty 1000

## 2012-02-27 MED ORDER — THROMBIN 20000 UNITS EX KIT
PACK | CUTANEOUS | Status: DC | PRN
  Administered 2012-02-27: 16:00:00 via TOPICAL

## 2012-02-27 MED ORDER — VITAMIN D3 25 MCG (1000 UNIT) PO TABS
1000.0000 [IU] | ORAL_TABLET | Freq: Every day | ORAL | Status: DC
  Administered 2012-02-27 – 2012-02-29 (×3): 1000 [IU] via ORAL
  Filled 2012-02-27 (×3): qty 1

## 2012-02-27 MED ORDER — ACETAMINOPHEN 325 MG PO TABS
650.0000 mg | ORAL_TABLET | Freq: Four times a day (QID) | ORAL | Status: DC | PRN
  Administered 2012-02-28 – 2012-02-29 (×3): 650 mg via ORAL
  Filled 2012-02-27 (×4): qty 2

## 2012-02-27 MED ORDER — METHOCARBAMOL 500 MG PO TABS: 500.0000 mg | ORAL_TABLET | Freq: Four times a day (QID) | ORAL | Status: DC | PRN

## 2012-02-27 MED ORDER — KETOROLAC TROMETHAMINE 30 MG/ML IJ SOLN
30.0000 mg | Freq: Four times a day (QID) | INTRAMUSCULAR | Status: DC
  Administered 2012-02-27 – 2012-02-29 (×7): 30 mg via INTRAVENOUS
  Filled 2012-02-27 (×14): qty 1

## 2012-02-27 MED ORDER — CALCIUM CARBONATE-VITAMIN D 500-200 MG-UNIT PO TABS
1.0000 | ORAL_TABLET | Freq: Two times a day (BID) | ORAL | Status: DC
  Administered 2012-02-27 – 2012-02-29 (×4): 1 via ORAL
  Filled 2012-02-27 (×5): qty 1

## 2012-02-27 MED ORDER — PROPOFOL 10 MG/ML IV EMUL
INTRAVENOUS | Status: DC | PRN
  Administered 2012-02-27: 200 mg via INTRAVENOUS

## 2012-02-27 MED ORDER — TRAZODONE HCL 50 MG PO TABS
500.0000 mg | ORAL_TABLET | Freq: Every day | ORAL | Status: DC
  Administered 2012-02-27 – 2012-02-28 (×2): 500 mg via ORAL
  Filled 2012-02-27 (×3): qty 1

## 2012-02-27 MED ORDER — ACETAMINOPHEN 10 MG/ML IV SOLN
INTRAVENOUS | Status: DC | PRN
  Administered 2012-02-27: 1000 mg via INTRAVENOUS

## 2012-02-27 MED ORDER — PHENOL 1.4 % MT LIQD: 1.0000 | OROMUCOSAL | Status: DC | PRN

## 2012-02-27 MED ORDER — METHOCARBAMOL 100 MG/ML IJ SOLN
500.0000 mg | Freq: Four times a day (QID) | INTRAVENOUS | Status: DC | PRN
  Administered 2012-02-27: 500 mg via INTRAVENOUS
  Filled 2012-02-27: qty 5

## 2012-02-27 MED ORDER — HYDROMORPHONE 0.3 MG/ML IV SOLN
INTRAVENOUS | Status: DC
  Administered 2012-02-27: 23:00:00 via INTRAVENOUS
  Administered 2012-02-27: 4.2 mg via INTRAVENOUS
  Administered 2012-02-27 – 2012-02-28 (×2): via INTRAVENOUS
  Administered 2012-02-28 (×2): 4.2 mg via INTRAVENOUS
  Filled 2012-02-27 (×2): qty 25

## 2012-02-27 MED ORDER — LACTATED RINGERS IV SOLN
INTRAVENOUS | Status: DC | PRN
  Administered 2012-02-27 (×3): via INTRAVENOUS

## 2012-02-27 MED ORDER — METOCLOPRAMIDE HCL 10 MG PO TABS: 5.0000 mg | ORAL_TABLET | Freq: Three times a day (TID) | ORAL | Status: DC | PRN

## 2012-02-27 MED ORDER — ROCURONIUM BROMIDE 100 MG/10ML IV SOLN
INTRAVENOUS | Status: DC | PRN
  Administered 2012-02-27: 50 mg via INTRAVENOUS

## 2012-02-27 MED ORDER — OMEGA-3-ACID ETHYL ESTERS 1 G PO CAPS
1.0000 g | ORAL_CAPSULE | Freq: Two times a day (BID) | ORAL | Status: DC
  Administered 2012-02-27 – 2012-02-29 (×4): 1 g via ORAL
  Filled 2012-02-27 (×5): qty 1

## 2012-02-27 MED ORDER — ACETAMINOPHEN 650 MG RE SUPP: 650.0000 mg | Freq: Four times a day (QID) | RECTAL | Status: DC | PRN

## 2012-02-27 MED ORDER — BUPIVACAINE HCL 0.5 % IJ SOLN
INTRAMUSCULAR | Status: DC | PRN
  Administered 2012-02-27: 10 mL

## 2012-02-27 MED ORDER — DIPHENHYDRAMINE HCL 12.5 MG/5ML PO ELIX: 12.5000 mg | ORAL_SOLUTION | ORAL | Status: DC | PRN

## 2012-02-27 MED ORDER — CLINDAMYCIN PHOSPHATE 600 MG/50ML IV SOLN
600.0000 mg | Freq: Four times a day (QID) | INTRAVENOUS | Status: AC
  Administered 2012-02-27 – 2012-02-28 (×3): 600 mg via INTRAVENOUS
  Filled 2012-02-27 (×3): qty 50

## 2012-02-27 MED ORDER — ACETAMINOPHEN 10 MG/ML IV SOLN
INTRAVENOUS | Status: AC
  Filled 2012-02-27: qty 100

## 2012-02-27 MED ORDER — GLYCOPYRROLATE 0.2 MG/ML IJ SOLN
INTRAMUSCULAR | Status: DC | PRN
  Administered 2012-02-27: .5 mg via INTRAVENOUS

## 2012-02-27 MED ORDER — NORTRIPTYLINE HCL 25 MG PO CAPS
50.0000 mg | ORAL_CAPSULE | Freq: Every day | ORAL | Status: DC
  Administered 2012-02-28: 50 mg via ORAL
  Filled 2012-02-27 (×3): qty 2

## 2012-02-27 MED ORDER — NORTRIPTYLINE HCL 25 MG PO CAPS: 50.0000 mg | ORAL_CAPSULE | Freq: Every day | ORAL | Status: DC

## 2012-02-27 MED ORDER — OXYCODONE-ACETAMINOPHEN 5-325 MG PO TABS: 1.0000 | ORAL_TABLET | ORAL | Status: DC | PRN

## 2012-02-27 MED ORDER — ALUM & MAG HYDROXIDE-SIMETH 200-200-20 MG/5ML PO SUSP: 30.0000 mL | ORAL | Status: DC | PRN

## 2012-02-27 MED ORDER — ALBUTEROL SULFATE HFA 108 (90 BASE) MCG/ACT IN AERS
2.0000 | INHALATION_SPRAY | RESPIRATORY_TRACT | Status: DC | PRN
  Filled 2012-02-27: qty 6.7

## 2012-02-27 MED ORDER — LORAZEPAM 2 MG/ML IJ SOLN: 1.0000 mg | Freq: Once | INTRAMUSCULAR | Status: DC | PRN

## 2012-02-27 MED ORDER — ONDANSETRON HCL 4 MG/2ML IJ SOLN: 4.0000 mg | Freq: Four times a day (QID) | INTRAMUSCULAR | Status: DC | PRN

## 2012-02-27 MED ORDER — OMEGA-3 FATTY ACIDS 1000 MG PO CAPS: 1.0000 g | ORAL_CAPSULE | Freq: Two times a day (BID) | ORAL | Status: DC

## 2012-02-27 MED ORDER — MORPHINE SULFATE 15 MG PO TABS
15.0000 mg | ORAL_TABLET | ORAL | Status: DC | PRN
  Administered 2012-02-29: 15 mg via ORAL
  Filled 2012-02-27 (×3): qty 1

## 2012-02-27 MED ORDER — SODIUM CHLORIDE 0.9 % IR SOLN
Status: DC | PRN
  Administered 2012-02-27 (×2): 1000 mL

## 2012-02-27 MED ORDER — HYDROMORPHONE HCL PF 1 MG/ML IJ SOLN
0.2500 mg | INTRAMUSCULAR | Status: DC | PRN
  Administered 2012-02-27 (×2): 0.5 mg via INTRAVENOUS

## 2012-02-27 MED ORDER — DEXAMETHASONE SODIUM PHOSPHATE 4 MG/ML IJ SOLN
INTRAMUSCULAR | Status: DC | PRN
  Administered 2012-02-27: 4 mg via INTRAVENOUS

## 2012-02-27 MED ORDER — EST ESTROGENS-METHYLTEST 0.625-1.25 MG PO TABS: 2.0000 | ORAL_TABLET | ORAL | Status: DC

## 2012-02-27 MED ORDER — SENNOSIDES-DOCUSATE SODIUM 8.6-50 MG PO TABS: 1.0000 | ORAL_TABLET | Freq: Every evening | ORAL | Status: DC | PRN

## 2012-02-27 MED ORDER — NALOXONE HCL 0.4 MG/ML IJ SOLN: 0.4000 mg | INTRAMUSCULAR | Status: DC | PRN

## 2012-02-27 MED ORDER — LIDOCAINE HCL (CARDIAC) 20 MG/ML IV SOLN
INTRAVENOUS | Status: DC | PRN
  Administered 2012-02-27: 50 mg via INTRAVENOUS

## 2012-02-27 MED ORDER — ONDANSETRON HCL 4 MG PO TABS: 4.0000 mg | ORAL_TABLET | Freq: Four times a day (QID) | ORAL | Status: DC | PRN

## 2012-02-27 MED ORDER — METOCLOPRAMIDE HCL 5 MG/ML IJ SOLN: 5.0000 mg | Freq: Three times a day (TID) | INTRAMUSCULAR | Status: DC | PRN

## 2012-02-27 MED ORDER — LACTATED RINGERS IV SOLN
INTRAVENOUS | Status: DC
  Administered 2012-02-27: 11:00:00 via INTRAVENOUS

## 2012-02-27 MED ORDER — LORAZEPAM 1 MG PO TABS
1.0000 mg | ORAL_TABLET | Freq: Three times a day (TID) | ORAL | Status: DC
  Administered 2012-02-27 – 2012-02-29 (×6): 1 mg via ORAL
  Filled 2012-02-27 (×7): qty 1

## 2012-02-27 MED ORDER — DOCUSATE SODIUM 100 MG PO CAPS
100.0000 mg | ORAL_CAPSULE | Freq: Two times a day (BID) | ORAL | Status: DC
  Administered 2012-02-27 – 2012-02-29 (×4): 100 mg via ORAL
  Filled 2012-02-27 (×4): qty 1

## 2012-02-27 MED ORDER — MENTHOL 3 MG MT LOZG: 1.0000 | LOZENGE | OROMUCOSAL | Status: DC | PRN

## 2012-02-27 MED ORDER — MIDAZOLAM HCL 5 MG/5ML IJ SOLN
INTRAMUSCULAR | Status: DC | PRN
  Administered 2012-02-27 (×2): 2 mg via INTRAVENOUS

## 2012-02-27 MED ORDER — FLEET ENEMA 7-19 GM/118ML RE ENEM: 1.0000 | ENEMA | Freq: Once | RECTAL | Status: AC | PRN

## 2012-02-27 MED ORDER — SIMVASTATIN 20 MG PO TABS
20.0000 mg | ORAL_TABLET | Freq: Every day | ORAL | Status: DC
  Administered 2012-02-27 – 2012-02-28 (×2): 20 mg via ORAL
  Filled 2012-02-27 (×3): qty 1

## 2012-02-27 MED ORDER — CLINDAMYCIN PHOSPHATE 600 MG/50ML IV SOLN
600.0000 mg | INTRAVENOUS | Status: DC
  Filled 2012-02-27: qty 50

## 2012-02-27 MED ORDER — DIPHENHYDRAMINE HCL 50 MG/ML IJ SOLN: 12.5000 mg | Freq: Four times a day (QID) | INTRAMUSCULAR | Status: DC | PRN

## 2012-02-27 MED ORDER — ADULT MULTIVITAMIN W/MINERALS CH
1.0000 | ORAL_TABLET | Freq: Every day | ORAL | Status: DC
  Administered 2012-02-27 – 2012-02-29 (×3): 1 via ORAL
  Filled 2012-02-27 (×3): qty 1

## 2012-02-27 MED ORDER — VECURONIUM BROMIDE 10 MG IV SOLR
INTRAVENOUS | Status: DC | PRN
  Administered 2012-02-27: 3 mg via INTRAVENOUS
  Administered 2012-02-27 (×2): 2 mg via INTRAVENOUS
  Administered 2012-02-27 (×2): 1 mg via INTRAVENOUS

## 2012-02-27 MED ORDER — PHENYLEPHRINE HCL 10 MG/ML IJ SOLN
10.0000 mg | INTRAVENOUS | Status: DC | PRN
  Administered 2012-02-27: 25 ug/min via INTRAVENOUS

## 2012-02-27 MED ORDER — CLINDAMYCIN PHOSPHATE 600 MG/50ML IV SOLN
INTRAVENOUS | Status: DC | PRN
  Administered 2012-02-27: 600 mg via INTRAVENOUS

## 2012-02-27 MED ORDER — DIPHENHYDRAMINE HCL 12.5 MG/5ML PO ELIX: 12.5000 mg | ORAL_SOLUTION | Freq: Four times a day (QID) | ORAL | Status: DC | PRN

## 2012-02-27 MED ORDER — DIPHENHYDRAMINE HCL 50 MG/ML IJ SOLN
INTRAMUSCULAR | Status: DC | PRN
  Administered 2012-02-27: 50 mg via INTRAVENOUS

## 2012-02-27 MED ORDER — MUPIROCIN 2 % EX OINT: TOPICAL_OINTMENT | Freq: Two times a day (BID) | CUTANEOUS | Status: DC

## 2012-02-27 MED ORDER — FENTANYL CITRATE 0.05 MG/ML IJ SOLN
INTRAMUSCULAR | Status: DC | PRN
  Administered 2012-02-27: 100 ug via INTRAVENOUS
  Administered 2012-02-27 (×4): 50 ug via INTRAVENOUS
  Administered 2012-02-27 (×2): 100 ug via INTRAVENOUS
  Administered 2012-02-27 (×2): 50 ug via INTRAVENOUS

## 2012-02-27 MED ORDER — ONDANSETRON HCL 4 MG/2ML IJ SOLN
INTRAMUSCULAR | Status: DC | PRN
  Administered 2012-02-27: 4 mg via INTRAVENOUS

## 2012-02-27 MED ORDER — CALCIUM CARBONATE-VITAMIN D 600-200 MG-UNIT PO TABS
1.0000 | ORAL_TABLET | Freq: Two times a day (BID) | ORAL | Status: DC
Start: 2012-02-27 — End: 2012-02-27

## 2012-02-27 MED ORDER — BUPIVACAINE-EPINEPHRINE 0.25% -1:200000 IJ SOLN
INTRAMUSCULAR | Status: DC | PRN
  Administered 2012-02-27: 5 mL

## 2012-02-27 MED ORDER — TEMAZEPAM 15 MG PO CAPS
30.0000 mg | ORAL_CAPSULE | Freq: Every day | ORAL | Status: DC
  Administered 2012-02-27 – 2012-02-29 (×2): 30 mg via ORAL
  Filled 2012-02-27 (×2): qty 2

## 2012-02-27 MED ORDER — NEOSTIGMINE METHYLSULFATE 1 MG/ML IJ SOLN
INTRAMUSCULAR | Status: DC | PRN
  Administered 2012-02-27: 4 mg via INTRAVENOUS

## 2012-02-27 MED ORDER — EST ESTROGENS-METHYLTEST 1.25-2.5 MG PO TABS
1.0000 | ORAL_TABLET | ORAL | Status: DC
  Administered 2012-02-27: 1 via ORAL

## 2012-02-27 SURGICAL SUPPLY — 96 items
ADH SKN CLS APL DERMABOND .7 (GAUZE/BANDAGES/DRESSINGS) ×6
ANCH SUT 2 CP-2 EBND QANCHR+ (Anchor) ×3 IMPLANT
ANCHOR SUPER QUICK (Anchor) ×2 IMPLANT
BLADE SAGITTAL (BLADE) ×4
BLADE SAW THK.89X75X18XSGTL (BLADE) ×3 IMPLANT
BLADE SURG ROTATE 9660 (MISCELLANEOUS) IMPLANT
BUR ROUND FLUTED 4 SOFT TCH (BURR) ×2 IMPLANT
BUR SABER RD CUTTING 3.0 (BURR) ×2 IMPLANT
CLOTH BEACON ORANGE TIMEOUT ST (SAFETY) ×4 IMPLANT
COLLAR CERV LO CONTOUR FIRM DE (SOFTGOODS) ×4 IMPLANT
CORDS BIPOLAR (ELECTRODE) ×4 IMPLANT
COVER SURGICAL LIGHT HANDLE (MISCELLANEOUS) ×4 IMPLANT
DERMABOND ADVANCED (GAUZE/BANDAGES/DRESSINGS) ×2
DERMABOND ADVANCED .7 DNX12 (GAUZE/BANDAGES/DRESSINGS) ×4 IMPLANT
DRAIN TLS ROUND 10FR (DRAIN) IMPLANT
DRAPE C-ARM 42X72 X-RAY (DRAPES) ×4 IMPLANT
DRAPE INCISE IOBAN 66X45 STRL (DRAPES) IMPLANT
DRAPE MICROSCOPE LEICA (MISCELLANEOUS) ×2 IMPLANT
DRAPE ORTHO SPLIT 77X108 STRL (DRAPES) ×4
DRAPE POUCH INSTRU U-SHP 10X18 (DRAPES) ×2 IMPLANT
DRAPE PROXIMA HALF (DRAPES) ×8 IMPLANT
DRAPE SURG 17X23 STRL (DRAPES) ×12 IMPLANT
DRAPE SURG ORHT 6 SPLT 77X108 (DRAPES) ×3 IMPLANT
DRSG MEPILEX BORDER 4X4 (GAUZE/BANDAGES/DRESSINGS) IMPLANT
DRSG MEPILEX BORDER 4X8 (GAUZE/BANDAGES/DRESSINGS) ×2 IMPLANT
DRSG PAD ABDOMINAL 8X10 ST (GAUZE/BANDAGES/DRESSINGS) ×4 IMPLANT
DURAPREP 26ML APPLICATOR (WOUND CARE) ×6 IMPLANT
DURAPREP 6ML APPLICATOR 50/CS (WOUND CARE) ×4 IMPLANT
ELECT BLADE 4.0 EZ CLEAN MEGAD (MISCELLANEOUS)
ELECT CAUTERY BLADE 6.4 (BLADE) ×4 IMPLANT
ELECT COATED BLADE 2.86 ST (ELECTRODE) ×2 IMPLANT
ELECT REM PT RETURN 9FT ADLT (ELECTROSURGICAL) ×4
ELECTRODE BLDE 4.0 EZ CLN MEGD (MISCELLANEOUS) IMPLANT
ELECTRODE REM PT RTRN 9FT ADLT (ELECTROSURGICAL) ×3 IMPLANT
EVACUATOR 3/16  PVC DRAIN (DRAIN) ×1
EVACUATOR 3/16 PVC DRAIN (DRAIN) ×3 IMPLANT
GAUZE XEROFORM 5X9 LF (GAUZE/BANDAGES/DRESSINGS) ×4 IMPLANT
GLOVE BIOGEL PI IND STRL 7.5 (GLOVE) ×3 IMPLANT
GLOVE BIOGEL PI INDICATOR 7.5 (GLOVE) ×1
GLOVE ECLIPSE 7.0 STRL STRAW (GLOVE) ×4 IMPLANT
GLOVE ECLIPSE 8.5 STRL (GLOVE) ×4 IMPLANT
GLOVE SURG 8.5 LATEX PF (GLOVE) ×4 IMPLANT
GOWN PREVENTION PLUS LG XLONG (DISPOSABLE) IMPLANT
GOWN PREVENTION PLUS XXLARGE (GOWN DISPOSABLE) ×4 IMPLANT
GOWN STRL NON-REIN LRG LVL3 (GOWN DISPOSABLE) ×12 IMPLANT
HANDPIECE INTERPULSE COAX TIP (DISPOSABLE)
HEAD HALTER (SOFTGOODS) ×4 IMPLANT
KIT BASIN OR (CUSTOM PROCEDURE TRAY) ×4 IMPLANT
KIT ROOM TURNOVER OR (KITS) ×4 IMPLANT
MANIFOLD NEPTUNE II (INSTRUMENTS) ×4 IMPLANT
NDL 1/2 CIR CATGUT .05X1.09 (NEEDLE) IMPLANT
NDL 18GX1X1/2 (RX/OR ONLY) (NEEDLE) IMPLANT
NDL SPNL 20GX3.5 QUINCKE YW (NEEDLE) ×4 IMPLANT
NEEDLE 1/2 CIR CATGUT .05X1.09 (NEEDLE) ×4 IMPLANT
NEEDLE 18GX1X1/2 (RX/OR ONLY) (NEEDLE) ×4 IMPLANT
NEEDLE 22X1 1/2 (OR ONLY) (NEEDLE) ×2 IMPLANT
NEEDLE SPNL 20GX3.5 QUINCKE YW (NEEDLE) IMPLANT
NS IRRIG 1000ML POUR BTL (IV SOLUTION) ×4 IMPLANT
PACK ORTHO CERVICAL (CUSTOM PROCEDURE TRAY) ×4 IMPLANT
PACK SHOULDER (CUSTOM PROCEDURE TRAY) ×4 IMPLANT
PAD ARMBOARD 7.5X6 YLW CONV (MISCELLANEOUS) ×8 IMPLANT
PATTIES SURGICAL .5 X.5 (GAUZE/BANDAGES/DRESSINGS) IMPLANT
PIN DISTRACTION 14MM (PIN) ×4 IMPLANT
SET HNDPC FAN SPRY TIP SCT (DISPOSABLE) IMPLANT
SLING ARM FOAM STRAP MED (SOFTGOODS) ×2 IMPLANT
SPECIMEN JAR SMALL (MISCELLANEOUS) ×4 IMPLANT
SPONGE GAUZE 4X4 12PLY (GAUZE/BANDAGES/DRESSINGS) ×4 IMPLANT
SPONGE INTESTINAL PEANUT (DISPOSABLE) IMPLANT
SPONGE LAP 18X18 X RAY DECT (DISPOSABLE) ×8 IMPLANT
SPONGE LAP 4X18 X RAY DECT (DISPOSABLE) ×4 IMPLANT
SPONGE SURGIFOAM ABS GEL 100 (HEMOSTASIS) ×2 IMPLANT
SUCTION FRAZIER TIP 10 FR DISP (SUCTIONS) ×4 IMPLANT
SUT BONE WAX W31G (SUTURE) ×4 IMPLANT
SUT ETHIBOND NAB CT1 #1 30IN (SUTURE) ×12 IMPLANT
SUT PROLENE 5 0 P 3 (SUTURE) ×2 IMPLANT
SUT VIC AB 0 CT1 27 (SUTURE) ×4
SUT VIC AB 0 CT1 27XBRD ANBCTR (SUTURE) ×5 IMPLANT
SUT VIC AB 1 CT1 27 (SUTURE) ×4
SUT VIC AB 1 CT1 27XBRD ANBCTR (SUTURE) ×5 IMPLANT
SUT VIC AB 2-0 CT1 27 (SUTURE) ×8
SUT VIC AB 2-0 CT1 TAPERPNT 27 (SUTURE) ×6 IMPLANT
SUT VIC AB 3-0 FS2 27 (SUTURE) ×2 IMPLANT
SUT VICRYL 0 UR6 27IN ABS (SUTURE) ×4 IMPLANT
SUT VICRYL 4-0 PS2 18IN ABS (SUTURE) ×6 IMPLANT
SYR 30ML LL (SYRINGE) ×2 IMPLANT
SYR CONTROL 10ML LL (SYRINGE) ×6 IMPLANT
SYRINGE 10CC LL (SYRINGE) ×2 IMPLANT
SYSTEM CHEST DRAIN TLS 7FR (DRAIN) IMPLANT
TOWEL OR 17X24 6PK STRL BLUE (TOWEL DISPOSABLE) ×4 IMPLANT
TOWEL OR 17X26 10 PK STRL BLUE (TOWEL DISPOSABLE) ×4 IMPLANT
TOWER CARTRIDGE SMART MIX (DISPOSABLE) IMPLANT
TRAY FOLEY CATH 14FR (SET/KITS/TRAYS/PACK) IMPLANT
TUBE CONNECTING 12X1/4 (SUCTIONS) ×2 IMPLANT
UNDERPAD 30X30 INCONTINENT (UNDERPADS AND DIAPERS) ×4 IMPLANT
WATER STERILE IRR 1000ML POUR (IV SOLUTION) ×4 IMPLANT
YANKAUER SUCT BULB TIP NO VENT (SUCTIONS) ×2 IMPLANT

## 2012-02-27 NOTE — Transfer of Care (Signed)
Immediate Anesthesia Transfer of Care Note  Patient: [name]  Procedure(s) Performed: Procedure(s) (LRB): SHOULDER HEMI-ARTHROPLASTY (Right) HARDWARE REMOVAL ()  Patient Location: PACU  Anesthesia Type: General  Level of Consciousness: awake, alert  and oriented  Airway & Oxygen Therapy: Patient Spontanous Breathing and Patient connected to nasal cannula oxygen  Post-op Assessment: Report given to PACU RN and Post -op Vital signs reviewed and stable  Post vital signs: Reviewed and stable  Complications: No apparent anesthesia complications

## 2012-02-27 NOTE — Brief Op Note (Signed)
02/27/2012  3:57 PM  PATIENT:  [name]  61 y.o. female  PRE-OPERATIVE DIAGNOSIS:  Right proximal humerus fracture, Loose screw right T1 S/P cervical fusionC5-6,corpectomy C7, plate and screws C5-T1  POST-OPERATIVE DIAGNOSIS:  Right proximal humerus fracture, Loose screw right T1 S/P cervical, screw is fractured. Retained portion of the tip of screw left in place.   PROCEDURE:  Procedure(s) (LRB): SHOULDER HEMI-ARTHROPLASTY (Right). Removal of loosening broken screw right T1.  SURGEON:  Surgeon(s) and Role:     Kerrin Champagne, MD - Primary  PHYSICIAN ASSISTANT: Maud Deed   ANESTHESIA:   general, Dr. Gypsy Balsam, local 4cc marcaine 1/2% left neck, 10 cc right shoulder.  EBL:  Total I/O In: 2500 [I.V.:2500] Out: 550 [Urine:350; Blood:200]  BLOOD ADMINISTERED:none  DRAINS: Urinary Catheter (Foley)   LOCAL MEDICATIONS USED:  MARCAINE    and Amount: total 14 ml  SPECIMEN:  No Specimen and Source of Specimen:  Broken screw right T1.  DISPOSITION OF SPECIMEN:  PATHOLOGY  COUNTS:  YES  TOURNIQUET:  * No tourniquets in log *  DICTATION: .Dragon Dictation  PLAN OF CARE: Admit to inpatient   PATIENT DISPOSITION:  PACU - hemodynamically stable.   Delay start of Pharmacological VTE agent (>24hrs) due to surgical blood loss or risk of bleeding: not applicable

## 2012-02-27 NOTE — OR Nursing (Signed)
2nd procedure incision made at 1519.

## 2012-02-27 NOTE — Anesthesia Procedure Notes (Signed)
Procedure Name: Intubation Date/Time: 02/27/2012 12:09 PM Performed by: Elon Alas Pre-anesthesia Checklist: Patient identified, Timeout performed, Emergency Drugs available, Suction available and Patient being monitored Patient Re-evaluated:Patient Re-evaluated prior to inductionOxygen Delivery Method: Circle system utilized Preoxygenation: Pre-oxygenation with 100% oxygen Intubation Type: IV induction Ventilation: Mask ventilation without difficulty and Oral airway inserted - appropriate to patient size Laryngoscope Size: Mac and 3 Grade View: Grade I Tube type: Oral Tube size: 7.5 mm Number of attempts: 1 Airway Equipment and Method: Stylet Placement Confirmation: positive ETCO2,  ETT inserted through vocal cords under direct vision and breath sounds checked- equal and bilateral Secured at: 21 cm Tube secured with: Tape Dental Injury: Teeth and Oropharynx as per pre-operative assessment

## 2012-02-27 NOTE — H&P (Signed)
Patient was seen and examined in the preop holding area. There has been no interval  Change in this patient's exam preop  history and physical exam  Lab tests and images have been examined and reviewed.  The Risks benefits and alternative treatments have been discussed  extensively,questions answered.  The patient has elected to undergo the discussed surgical treatment.  Discussed the results of chest xray with her and will plan to Work up with CT unenhanced as recommended by radiologist.

## 2012-02-27 NOTE — Progress Notes (Signed)
Received from PACU - VSS, Resp unlabored,regiular. Skin w/d.Ice to lt shoulder. +CMS.+sling.

## 2012-02-27 NOTE — Anesthesia Preprocedure Evaluation (Signed)
Anesthesia Evaluation  Patient identified by MRN, date of birth, ID band Patient awake    Reviewed: Allergy & Precautions, H&P , NPO status , Patient's Chart, lab work & pertinent test results  Airway Mallampati: I TM Distance: >3 FB Neck ROM: Limited    Dental   Pulmonary asthma , COPDCurrent Smoker,  + rhonchi         Cardiovascular hypertension,     Neuro/Psych Anxiety Depression    GI/Hepatic GERD-  ,  Endo/Other    Renal/GU      Musculoskeletal   Abdominal   Peds  Hematology   Anesthesia Other Findings   Reproductive/Obstetrics                           Anesthesia Physical Anesthesia Plan  ASA: II  Anesthesia Plan: General   Post-op Pain Management:    Induction: Intravenous  Airway Management Planned: Oral ETT  Additional Equipment:   Intra-op Plan:   Post-operative Plan: Extubation in OR  Informed Consent: I have reviewed the patients History and Physical, chart, labs and discussed the procedure including the risks, benefits and alternatives for the proposed anesthesia with the patient or authorized representative who has indicated his/her understanding and acceptance.     Plan Discussed with: CRNA and Surgeon  Anesthesia Plan Comments:         Anesthesia Quick Evaluation

## 2012-02-27 NOTE — Op Note (Signed)
02/27/2012  4:21 PM  PATIENT:  [name]  61 y.o. female  MRN: [id number]  OPERATIVE REPORT  PRE-OPERATIVE DIAGNOSIS:  Right proximal humerus fracture, Loose screw right T1 S/P cervical fusionC5-6,corpectomy C7, plate and screws C5-T1   POST-OPERATIVE DIAGNOSIS:  Right proximal humerus fracture, Loose screw right T1 S/P cervical S/P cervical fusion C5-6,corpectomy C7, plate and screws C5-T1. Retained portion of the tip of screw left in place    PROCEDURE:  Procedure(s): SHOULDER HEMI-ARTHROPLASTY HARDWARE REMOVAL Removal of loosening broken screw right T1.     SURGEON:  Kerrin Champagne, MD     ASSISTANT:  Maud Deed, PA-C  (Present throughout the entire procedure     and necessary for completion of procedure in a timely manner)     ANESTHESIA:  General,    COMPLICATIONS:  None.     COMPONENTS:   PROCEDURE: The patient was met in the holding area, and the appropriate right arm and left neck identified and both marked with an "X" and my initials. The patient was then transported to OR and was placed on the operative table in a supine position. The patient was then placed under  general anesthesia without difficulty. The Allen shoulder frame was used. The patient received appropriate preoperative antibiotic prophylaxis.  . Nursing staff inserted a Foley catheter under sterile conditions.   Right shoulder circumferential upper arm about the elbow to the forearm about the axilla anterior posterior chest to the lower neck. was then prepped using sterile conditions and draped using sterile technique.  Time-out procedure was called and correct.  Standard deltopectoral approach was performed to the right shoulder incision approximately 12 cm in length extending just medial and at the level of the coracoid process towards the deltoid tuberosity. Incision through skin and subcutaneous layers Bovie electrocautery to control bleeders the deltopectoral fascia and then incised between the 2  muscles and blunt dissection then used to expose clavipectoral fascia over the anterior aspect of the proximal shoulder. The patient's subscapularis found to be intact after incision of the clavipectoral fascia the biceps tendon was identified over the superior aspect of the humeral head which was rotated extremely into significant abduction greater than 45  sitting on top of the metaphysis. After examination of the head and greater tuberosity appeared to be mostly intact the fragment off the greater tuberosity unable to be localized that was to be present the patient's immediate fracture x-rays and CT scans of her shoulder. Subscapularis was then carefully sharply excised off of the medial aspect of the greater tuberosity and preserving the biceps tendon within the bicipital groove and then further excised off of the lesser tuberosity and tagged with #1 Ethibond sutures. The anterior inferior medial capsule was then mobilized using electrocautery and the proximal humerus and humeral head were then able to be delivered into the incision in extension. Examining the proximal humerus the head was obviously deformed and no longer articulating with the glenoid as it was extremely asked actually rotated abducted. A length of the humerus appeared to be well maintained. With this in mind then a primary hemiarthroplasty type procedure was undertaken. Small opening was made in the superior aspect of the humeral head in line with the patient proximal humeral intramedullary canal. This was done using a small trough a with the oscillating saw then using the first 6 mm reamer in order to find the canal. Reaming was continued up to about a 8 mm reamer using power reaming reamer. Handheld reamer was then  used to ream the final 10 mm reamer. Then using a template of the forearm rotated externally about 30 the cut was made for placement of the neck of the humeral implant. Nose and marking of the bicipital groove was performed as  well. The surface Then examination was made using the smallest implant which provided good coverage for expected hemiarthroplasty here. Glenoid cavity appeared to be in good condition. Biceps tendon was first dislocated anteriorly and laterally with the broaching of the proximal humerus. Broaching was carried out 6 today than 10 mm 18 and 30 retroversion I externally rotating the arm while inserting the broach anterior flange of the broach was directed at the bicipital groove. A flange osteotome for the 10 mm implant was then inserted after performing an additional reaming of the proximal humerus additionally a small amount of the posterior cut was revised on the cut humeral surface in order for the implant to have better fit. With this then the trial implant was able to be seated in the correct degree of 30 retroversion. Trial then performed using 18 mm implant. This provided excellent fit a shocking was minimal and the patient did not appear to have any sign of overstuffing of the humeral joint or subacromial space. Decision was made to use th the cuff arthropathy head implant by [employer]. And the cutting guides were attached to the trial implant and used the carefully trim the residual superior lateral aspect of the of the upper aspect of the humerus where the normal greater tuberosity would reside. With this then the CTA implant was inserted using 18 mm head and this was then located and placed through range of motion abduction 90 external rotation 90 internal rotation. Did not appear to be any significant shucking of the implant and there did not appear to be any signs of scuffing of the glenohumeral joint. With this permanent implants were brought onto the field a global #10 porous-coated implant was chosen with the CTA 18 mm implant. With this then the trial implants were removed a search for the greater tuberosity fragment and the rotator cuff was performed. The get greater tuberosity fragment observed on  previous x-rays and CT was found to be present of the superior posterior aspect of the shoulder with careful exposure of this fragment it was apparent that there was no significant rotator cuff attachment. Rotator cuff itself had had retracted and there was no visible cuff remaining that could be reattached with the hemiarthroplasty of the shoulder.  With that in the greater tuberosity fragment was excised permanent implant was then impacted into place proximal humerus at approximately 30 retroversion. Permanent CTA 18 mm x 44 mm implant was then impacted into place after first cleansing the [facility] taper. Irrigation was carried out the shoulder was reduced placed through range of motion anterior flexion to over 90 and abduction swelling about rotation 90 internal rotation 80. This was acceptable. Drill holes were then placed over the area where the greater tuberosity was located and a single 4 prong Mytec anchor was placed over the medial aspect of the greater tuberosity for reattachment of patient subscapularis. The biceps tendon replaced over the humeral head implant and remained stable within the mid and distal portions the bicipital groove. The Ethibond suture attached to the Mytec anchor were then passed through the midportion of the subscapularis approximately a centimeter and a half proximal to the distal and sutures have been placed previously through the cut portion of the subscapularis was then placed through  drill holes that were enlarged using towel clip and carefully the shoulder was anteriorly flexed internally rotated and the sutures were then tied. Mytec anchor then tied as well to provide a pants over vest summation of the patient's subscapularis to the anterior medial aspect of the greater tuberosity and previous lesser tuberosity. Note that the patient's biceps tendon was flattened but appeared to be intact over 90%. With this then further irrigation was carried out portions of the  clavipectoral fascia that remained were then carefully reapproximated. After further irrigation and then the deltopectoral fascia layer was reapproximated with interrupted 0 Vicryl sutures subcutaneous layers approximated with interrupted 2-0 Vicryl sutures and skin closed with a running subcutaneous stitch of 4-0 Vicryl Dermabond was applied. Plain radiographs AP intraoperatively demonstrated the implant in good position alignment residual fracture extending laterally with healed appearance. The greater tuberosity fragments had been removed.  Attention then turned to performing removal of the screw from anterior cervical spine region. Patient was carefully returned to supine position on the [place] frame maintaining airway. A separate operating room table with [place] horseshoe in place was then used. Patient was then transferred from the [place] frame to the [place] horseshoe OR table in the right shoulder held anteriorly with padding behind the upper arm. No traction applied. The arms carefully padded and brought across the chest and as a papoose like effect of sheets. Anterior neck and carefully prepped with DuraPrep solution draped in the usual manner. Timeout protocol was carried out for the cervical spine for removal of the single screw right  T1. Following timeout then the skin was infiltrated in line with the previous old incision site on the anterior inferior left neck using Marcaine half percent plain  5 cc. Incision through skin subcutaneous layers down to the platysma layer. The platysma layer then incised in line with the skin. A while and muscle was divided and the interval between the patient's carotid sheath and the lateral aspect of the trachea and esophagus carefully developed using Metzenbaum scissors carefully spreading the layers. The patient's phrenic nerve identified and the carotid sheath maintain laterally. Plate was then identified and incision made directly on the plate along the medial  borders of the longus Collie muscles. We are elevator then used to elevate the esophagus soft tissue over the anterior aspect the plate of the loosened screw was immediately identified. Tear was carefully freed from soft tissue from superior and inferior and screwdriver used to remove this without difficulty. A 2-0 or straight microcurette then used to curet the opening for the screw to ascertain whether there was any residual screw that could be removed. Measurement of the screw indicated in length 10 mm in and only 4 mm residual screw remain after carefully evaluating through the plate opening and seeing the very tip of the screw was decided to stop at this point and allow the residual screw to remain within the vertebral body rather than attempt removal which potentially could cause screw migration. Additionally it is felt that if screw were to be removed then the entire plate would need to be removed which and more extensive procedure would be necessary and is warranted. Irrigation was carried out is no active bleeding present the incision was closed by proximal to the subcutaneous layers with interrupted 3-0 Vicryl sutures the skin with running subcutaneous stitch of 4-0 Vicryl and Dermabond MedPlex bandage and a soft cervical collar apply plain radiographs obtained following removal school demonstrated that the screw which was loosened and was  anterior the plate had been removed. Patient was then reactivated extubated returned recovery room in satisfactory condition all sponge counts were correct.           Galileah Piggee E  02/27/2012, 4:21 PM

## 2012-02-27 NOTE — Progress Notes (Signed)
Ready to move pt noted a hives area on left inner forearm pt has no itching anesthesia informed

## 2012-02-27 NOTE — H&P (Signed)
Reviewed. jen 

## 2012-02-27 NOTE — H&P (Signed)
Reviewed and agree.

## 2012-02-27 NOTE — OR Nursing (Signed)
Pt repositioned at 1455 for T1 screw removal/replacement

## 2012-02-27 NOTE — Anesthesia Postprocedure Evaluation (Signed)
  Anesthesia Post-op Note  Patient: [name]  Procedure(s) Performed: Procedure(s) (LRB): SHOULDER HEMI-ARTHROPLASTY (Right) HARDWARE REMOVAL ()  Patient Location: PACU  Anesthesia Type: General  Level of Consciousness: awake and alert   Airway and Oxygen Therapy: Patient Spontanous Breathing  Post-op Pain: mild  Post-op Assessment: Post-op Vital signs reviewed, Patient's Cardiovascular Status Stable, Respiratory Function Stable, Patent Airway, No signs of Nausea or vomiting and Pain level controlled  Post-op Vital Signs: stable  Complications: No apparent anesthesia complications

## 2012-02-27 NOTE — Progress Notes (Signed)
1715 anesthesia called about hives on left forearm did not want benedryl given.

## 2012-02-27 NOTE — Brief Op Note (Cosign Needed)
02/27/2012  4:38 PM  PATIENT:  [name]  61 y.o. female  PRE-OPERATIVE DIAGNOSIS:  Right proximal humerus fracture, Loose screw right T1 S/P cervical fusion  POST-OPERATIVE DIAGNOSIS:  Right proximal humerus fracture, Loose screw right T1 S/P cervical fusion  PROCEDURE:  Procedure(s) (LRB): SHOULDER HEMI-ARTHROPLASTY (Right) HARDWARE REMOVAL of the right T1 fractured screw  SURGEON:  Surgeon(s) and Role:    * Kerrin Champagne, MD - Primary  PHYSICIAN ASSISTANT: Maud Deed PAC   ASSISTANTS: none   ANESTHESIA:   general  EBL:  Total I/O In: 2750 [I.V.:2750] Out: 550 [Urine:350; Blood:200]  BLOOD ADMINISTERED:none  DRAINS: none   LOCAL MEDICATIONS USED:  NONE  SPECIMEN:  Source of Specimen:  screw removed from T1  DISPOSITION OF SPECIMEN:  PATHOLOGY  COUNTS:  YES  TOURNIQUET:  * No tourniquets in log *  DICTATION: .Other Dictation: Dictation Number dragon dictation  PLAN OF CARE: Admit to inpatient   PATIENT DISPOSITION:  PACU - hemodynamically stable.   Delay start of Pharmacological VTE agent (>24hrs) due to surgical blood loss or risk of bleeding: yes

## 2012-02-27 NOTE — Preoperative (Signed)
Beta Blockers   Reason not to administer Beta Blockers:Not Applicable 

## 2012-02-28 ENCOUNTER — Inpatient Hospital Stay (HOSPITAL_COMMUNITY): Payer: Medicare Other

## 2012-02-28 LAB — BASIC METABOLIC PANEL
Calcium: 8.4 mg/dL (ref 8.4–10.5)
Creatinine, Ser: 0.7 mg/dL (ref 0.50–1.10)
GFR calc non Af Amer: >90 mL/min (ref >90)
Sodium: 135 mEq/L (ref 135–145)

## 2012-02-28 LAB — HEMOGLOBIN AND HEMATOCRIT, BLOOD: HCT: 29.1 % — ABNORMAL LOW (ref 36.0–46.0)

## 2012-02-28 MED ORDER — MORPHINE SULFATE 15 MG PO TABS
15.0000 mg | ORAL_TABLET | Freq: Four times a day (QID) | ORAL | Status: DC
  Administered 2012-02-28 – 2012-02-29 (×5): 15 mg via ORAL
  Filled 2012-02-28 (×3): qty 1

## 2012-02-28 MED ORDER — HYDROMORPHONE HCL 2 MG PO TABS
4.0000 mg | ORAL_TABLET | ORAL | Status: DC | PRN
  Administered 2012-02-28 – 2012-02-29 (×7): 4 mg via ORAL
  Filled 2012-02-28 (×7): qty 2

## 2012-02-28 MED ORDER — KCL IN DEXTROSE-NACL 20-5-0.45 MEQ/L-%-% IV SOLN
INTRAVENOUS | Status: DC
  Filled 2012-02-28 (×4): qty 1000

## 2012-02-28 MED ORDER — ALBUTEROL SULFATE HFA 108 (90 BASE) MCG/ACT IN AERS
2.0000 | INHALATION_SPRAY | Freq: Four times a day (QID) | RESPIRATORY_TRACT | Status: DC | PRN
  Administered 2012-02-28: 2 via RESPIRATORY_TRACT
  Filled 2012-02-28 (×2): qty 6.7

## 2012-02-28 NOTE — Progress Notes (Signed)
Occupational Therapy Evaluation Patient Details Name: [name] MRN: [id number] DOB: 1951/07/17 Today's Date: 02/28/2012 Time: 4098-1191 OT Time Calculation (min): 40 min  OT Assessment / Plan / Recommendation Clinical Impression  61 yo s/p r hemiarthroplasty s/p fall @ 12 weeks ago. Ptwill benefit from skilled Ot services to max indep awith ADl and funcitonal mobility for ADl to facilitate D/C home. Pt will need to follow up with outpatient services.    OT Assessment  Patient needs continued OT Services    Follow Up Recommendations  Outpatient OT    Barriers to Discharge Decreased caregiver support    Equipment Recommendations  None recommended by OT    Recommendations for Other Services    Frequency  Min 2X/week    Precautions / Restrictions Precautions Precautions: Shoulder Type of Shoulder Precautions: No ROM R shoulder Precaution Booklet Issued: Yes (comment) Required Braces or Orthoses: Other Brace/Splint (sling) Other Brace/Splint: sling Restrictions Weight Bearing Restrictions: Yes RUE Weight Bearing: Non weight bearing   Pertinent Vitals/Pain 6. PCA pump    ADL  Eating/Feeding: Simulated;Modified independent Where Assessed - Eating/Feeding: Chair Grooming: Simulated;Supervision/safety Where Assessed - Grooming: Unsupported sitting Upper Body Bathing: Simulated;Minimal assistance Where Assessed - Upper Body Bathing: Unsupported sitting Lower Body Bathing: Simulated;Supervision/safety Where Assessed - Lower Body Bathing: Unsupported sit to stand Upper Body Dressing: Simulated;Supervision/safety Where Assessed - Upper Body Dressing: Unsupported sitting Lower Body Dressing: Simulated;Set up Where Assessed - Lower Body Dressing: Unsupported sit to stand Toilet Transfer: Performed;Modified independent Toilet Transfer Method: Sit to Barista: Regular height toilet Toileting - Clothing Manipulation and Hygiene: Performed;Modified  independent Where Assessed - Toileting Clothing Manipulation and Hygiene: Standing Equipment Used: Other (comment) (sling) Transfers/Ambulation Related to ADLs: supervision ADL Comments: Requires conitinued vc to not use RUE. States she knows how to do everything. Explained to pt that she has different precautions now that she has a hemiarthroplasty. Verbalized understanding .    OT Diagnosis: Generalized weakness;Acute pain  OT Problem List: Decreased strength;Decreased range of motion;Decreased knowledge of use of DME or AE;Decreased knowledge of precautions;Impaired UE functional use;Pain OT Treatment Interventions: Self-care/ADL training;Therapeutic exercise;Energy conservation;DME and/or AE instruction;Therapeutic activities;Patient/family education   OT Goals Acute Rehab OT Goals OT Goal Formulation: With patient Time For Goal Achievement: 03/06/12 Potential to Achieve Goals: Good ADL Goals Pt Will Perform Upper Body Bathing: with modified independence;Standing at sink ADL Goal: Upper Body Bathing - Progress: Goal set today Pt Will Perform Upper Body Dressing: with modified independence;Unsupported;Sit to stand from chair ADL Goal: Upper Body Dressing - Progress: Goal set today Additional ADL Goal #1: pt will be independent iwth donning/doffing sling. ADL Goal: Additional Goal #1 - Progress: Goal set today Arm Goals Pt Will Perform AROM: Independently;1 set;Right upper extremity (elbow wrist and hand within pain tolerance.) Arm Goal: AROM - Progress: Goal set today Additional Arm Goal #1: Pt will verbalize underatnding of shoulder protocal according to written handout given, independently. Arm Goal: Additional Goal #1 - Progress: Goal set today  Visit Information  Last OT Received On: 02/28/12    Subjective Data  Subjective: I always go hom by myself   Prior Functioning  Home Living Lives With: Alone Available Help at Discharge:  (none) Type of Home: Other (Comment)  (condo) Home Access: Level entry Home Layout: One level Bathroom Shower/Tub: Tub/shower unit;Curtain Firefighter: Standard Bathroom Accessibility: Yes How Accessible: Accessible via walker Home Adaptive Equipment: None Prior Function Level of Independence: Independent Able to Take Stairs?: Yes Driving: No Vocation: On  disability Comments: uses scat bus Communication Communication: No difficulties Dominant Hand: Right    Cognition  Overall Cognitive Status: Appears within functional limits for tasks assessed/performed Arousal/Alertness: Awake/alert Orientation Level: Appears intact for tasks assessed Behavior During Session: Anxious    Extremity/Trunk Assessment Right Upper Extremity Assessment RUE ROM/Strength/Tone: Deficits;Due to pain;Due to precautions RUE ROM/Strength/Tone Deficits: hemiarthroplasty. elbow,wrist, hand ROM only.  sensory deficit from previous neck injury RUE Sensation: Deficits RUE Coordination: WFL - fine motor Left Upper Extremity Assessment LUE ROM/Strength/Tone: WFL for tasks assessed LUE Sensation: WFL - Light Touch;WFL - Proprioception LUE Coordination: WFL - gross/fine motor Right Lower Extremity Assessment RLE ROM/Strength/Tone: WFL for tasks assessed Left Lower Extremity Assessment LLE ROM/Strength/Tone: WFL for tasks assessed Trunk Assessment Trunk Assessment: Normal   Mobility Bed Mobility Bed Mobility: Supine to Sit Supine to Sit: 6: Modified independent (Device/Increase time) Details for Bed Mobility Assistance: vc for NWB RUE. Transfers Transfers: Sit to Stand;Stand to Sit Sit to Stand: 6: Modified independent (Device/Increase time) Stand to Sit: 6: Modified independent (Device/Increase time) Details for Transfer Assistance: vc for nonuse RUE   Exercise General Exercises - Upper Extremity Elbow Flexion: AAROM;Right;5 reps;Supine Elbow Extension: AAROM;Right;5 reps;Supine Wrist Flexion: AROM;Right;10 reps;Supine Wrist Extension:  AROM;Right;10 reps;Supine Digit Composite Flexion: AROM;Right;10 reps Composite Extension: AROM;Right;10 reps  Balance Balance Balance Assessed:  (WFL)  End of Session OT - End of Session Equipment Utilized During Treatment: Gait belt;Other (comment) (sling) Activity Tolerance: Patient tolerated treatment well Patient left: in bed;with call bell/phone within reach Nurse Communication: Weight bearing status;Other (comment) (pt requested to talk to charge nurse)   [facility] 02/28/2012, 1:15 PM [facility], OTR/L  [phone] 02/28/2012

## 2012-02-28 NOTE — Progress Notes (Signed)
Subjective: 1 Day Post-Op Procedure(s) (LRB): SHOULDER HEMI-ARTHROPLASTY (Right) HARDWARE REMOVAL () she is awake alert oriented x4 shows me that she can actively assisted anterior flexed the right arm almost 45 of advised her not to do this. Instead passive motion but even that I would recommend that we avoided the external rotation of the right shoulder to be totally devoid. Pain is mild/moderate he is using the PCA and wanted [place] seemed to relieve her discomfort. Tolerating by mouth medicines she tires an inhaler and albuterol improve her breathing. Also is to use incentive respirometer he. Patient reports pain as 6 on 0-10 scale.    Objective: Vital signs in last 24 hours: Temp:  [96.5 F (35.8 C)-98.9 F (37.2 C)] 97.9 F (36.6 C) (06/15 0527) Pulse Rate:  [70-88] 88  (06/15 0527) Resp:  [13-20] 18  (06/15 0527) BP: (103-156)/(63-81) 142/70 mmHg (06/15 0527) SpO2:  [97 %-100 %] 100 % (06/15 0527)  Intake/Output from previous day: 06/14 0701 - 06/15 0700 In: 2750 [I.V.:2750] Out: 1150 [Urine:950; Blood:200] Intake/Output this shift:     Basename 02/28/12 0600 02/27/12 0909  HGB 9.9* 12.6    Basename 02/28/12 0600 02/27/12 0909  WBC -- 5.8  RBC -- 4.21  HCT 29.1* 37.1  PLT -- 280    Basename 02/28/12 0600 02/27/12 0909  NA 135 137  K 4.0 4.5  CL 101 104  CO2 26 21  BUN 16 19  CREATININE 0.70 0.76  GLUCOSE 114* 100*  CALCIUM 8.4 9.1   No results found for this basename: LABPT:2,INR:2 in the last 72 hours  Neurologically intact ABD soft Sensation intact distally Intact pulses distally Incision: moderate drainage and Dressing right shoulder is changed today. Dressing left neck is dry will change tomorrow cervical collar for use on a when necessary basis for discomfort. 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 Change from PCA on by mouth Dilaudid medicines today. She shows me police report  regarding loss of morphine sulfate immediate release medicine she has had report is likely taken from her domicile. Mild wheezing lower lobe upper lobe right side. Pain in her right shoulder is improved following surgery. She is advised to not perform active assisted or active exercise of the right shoulder until the tendon repair is healed.  CT scan chest performed today shows a 5.3 mm nodule right lower lung and she is at high risk for bronchogenic carcinoma it is recommended that she have a followup CT scan done 6 months. Definitive diagnosis of carcinoma cannot be made based on the CT scan. She does go to health or her primary care name is wishing to change over to [facility].   Assessment/Plan: 1 Day Post-Op Procedure(s) (LRB): SHOULDER HEMI-ARTHROPLASTY (Right) HARDWARE REMOVAL () left sided approach T1 level.  Advance diet Up with therapy Discharge home with home health possibly tomorrow. We will wean to by mouth narcotics. Discontinue Foley catheter the patient will do this herself. We'll start her on an inhaler for some mild wheezing today.  Noralyn Karim E 02/28/2012, 9:47 AM

## 2012-02-29 MED ORDER — MORPHINE SULFATE 15 MG PO TABS: 15.0000 mg | ORAL_TABLET | Freq: Four times a day (QID) | ORAL | Status: DC

## 2012-02-29 MED ORDER — TEMAZEPAM 30 MG PO CAPS: 30.0000 mg | ORAL_CAPSULE | Freq: Every day | ORAL | Status: DC

## 2012-02-29 MED ORDER — HYDROMORPHONE HCL 4 MG PO TABS: 4.0000 mg | ORAL_TABLET | ORAL | Status: DC | PRN

## 2012-02-29 MED ORDER — PNEUMOCOCCAL VAC POLYVALENT 25 MCG/0.5ML IJ INJ: 0.5000 mL | INJECTION | INTRAMUSCULAR | Status: DC

## 2012-02-29 NOTE — Progress Notes (Signed)
Occupational Therapy Treatment Patient Details Name: [name] MRN: [id number] DOB: [birth year] Today's Date: 02/29/2012 Time: 6578-4696 OT Time Calculation (min): 12 min  OT Assessment / Plan / Recommendation Comments on Treatment Session Pt has verbalizes and demonstrates good understanding of shoulder protocol and precautions.  All education completed. No further acute OT needs.     Follow Up Recommendations  Outpatient OT    Barriers to Discharge       Equipment Recommendations  None recommended by OT    Recommendations for Other Services    Frequency     Plan Discharge plan remains appropriate;All goals met and education completed, patient discharged from OT services    Precautions / Restrictions Precautions Precautions: Shoulder Type of Shoulder Precautions: No ROM R shoulder Precaution Booklet Issued: Yes (comment) Required Braces or Orthoses: Other Brace/Splint Other Brace/Splint: sling Restrictions Weight Bearing Restrictions: Yes RUE Weight Bearing: Non weight bearing   Pertinent Vitals/Pain See vitals    ADL  Grooming: Performed;Wash/dry hands;Wash/dry face;Brushing hair;Minimal assistance;Modified independent Where Assessed - Grooming: Unsupported standing Upper Body Bathing: Performed;Modified independent Where Assessed - Upper Body Bathing: Unsupported standing Toilet Transfer: Performed;Modified independent Toilet Transfer Method:  (ambulating) Toilet Transfer Equipment: Regular height toilet Equipment Used: Other (comment) (sling) Transfers/Ambulation Related to ADLs: mod I ADL Comments: Independently donned/doffed right sling. Pt demonstrated good use of Right elbow and wrist while avoiding shoulder ROM.  Independently verbalized shoulder protocol. Min assist with pulling hair up into ponytail.    OT Diagnosis:    OT Problem List:   OT Treatment Interventions:     OT Goals ADL Goals Pt Will Perform Upper Body Bathing: with modified  independence;Standing at sink ADL Goal: Upper Body Bathing - Progress: Met Pt Will Perform Upper Body Dressing: with modified independence;Unsupported;Sit to stand from chair ADL Goal: Upper Body Dressing - Progress: Met Additional ADL Goal #1: pt will be independent iwth donning/doffing sling. ADL Goal: Additional Goal #1 - Progress: Met Arm Goals Pt Will Perform AROM: Independently;1 set;Right upper extremity Arm Goal: AROM - Progress: Met Additional Arm Goal #1: Pt will verbalize underatnding of shoulder protocal according to written handout given, independently. Arm Goal: Additional Goal #1 - Progress: Met  Visit Information  Last OT Received On: 02/29/12    Subjective Data      Prior Functioning       Cognition  Overall Cognitive Status: Appears within functional limits for tasks assessed/performed Arousal/Alertness: Awake/alert Orientation Level: Appears intact for tasks assessed Behavior During Session: [facility] for tasks performed    Mobility Bed Mobility Bed Mobility: Not assessed Transfers Transfers: Sit to Stand;Stand to Sit Sit to Stand: 7: Independent;From bed Stand to Sit: 7: Independent;To bed   Exercises General Exercises - Upper Extremity Elbow Flexion: AROM;Right;10 reps;Standing Elbow Extension: AROM;Right;10 reps;Standing Wrist Flexion: AROM;Right;10 reps;Standing Wrist Extension: AROM;Right;10 reps;Standing Digit Composite Flexion: AROM;Right;10 reps;Standing Composite Extension: AROM;Right;10 reps;Standing  Balance    End of Session OT - End of Session Equipment Utilized During Treatment: Other (comment) (sling) Activity Tolerance: Patient tolerated treatment well Patient left: in bed;with call bell/phone within reach  02/29/2012 Cipriano Mile OTR/L Pager [phone] Office [phone]  Cipriano Mile 02/29/2012, 10:00 AM

## 2012-02-29 NOTE — Discharge Summary (Signed)
Physician Discharge Summary  Patient ID: [name] MRN: [id number] DOB/AGE: 12-14-1950 61 y.o.  Admit date: 02/27/2012 Discharge date:  02/29/2012  Admission Diagnoses:  Principal Problem:  *Humeral surgical neck fracture Active Problems:  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  Mass of lower lobe of right lung   Discharge Diagnoses:  Same  Past Medical History  Diagnosis Date  . Anxiety   . Depression   . High cholesterol   . Chronic pain   . Osteoporosis   . Hypertension     resolved whn she stopped drinking alcohol  . Asthma   . GERD (gastroesophageal reflux disease)   . Constipation   . Osteopenia   . Mitral prolapse     dx as a child    Surgeries: Procedure(s): SHOULDER HEMI-ARTHROPLASTY HARDWARE REMOVAL on 02/27/2012   Consultants:   none  Discharged Condition: Improved  Hospital Course: [name] is an 61 y.o. female who was admitted 02/27/2012 with a chief complaint of No chief complaint on file.  right shoulder pain following right shoulder surgical neck fracture 3 part with impacted humeral head displaced Gator tuberosity fracture. Injuries occurred in March of this year he was seen in our office and evaluated second opinion obtained with Dr. Ave Filter who recommended hemiarthroplasty for patient with likely osteoporosis difficulty healing previous anterior cervical spine surgery. At the time of diagnosis of her right humerus fracture due to fall x-rays of her neck demonstrated prominence of the right T1 screw within the cervical plate used to fix anterior cervical spine during surgery last March 2012. Found to have a diagnosis of Humeral surgical neck fracture. Also had findings of loosening of hardware anterior cervical spine with screw prominence necessitating removal of the broken screw right T1.  She was brought to the operating room on 02/27/2012 and underwent the above named procedures.   Her preoperative laboratory also demonstrated small 5 mm nodule right  lower lung and a  intrahospital CT scan on enhance of chest was recommended. They were given perioperative antibiotics:  Anti-infectives     Start     Dose/Rate Route Frequency Ordered Stop   02/27/12 2000   clindamycin (CLEOCIN) IVPB 600 mg        600 mg 100 mL/hr over 30 Minutes Intravenous Every 6 hours 02/27/12 1802 02/28/12 0917   02/27/12 1230   clindamycin (CLEOCIN) IVPB 600 mg  Status:  Discontinued        600 mg 100 mL/hr over 30 Minutes Intravenous To Surgery 02/27/12 1226 02/27/12 1736   02/26/12 1441   vancomycin (VANCOCIN) IVPB 1000 mg/200 mL premix        1,000 mg 200 mL/hr over 60 Minutes Intravenous 60 min pre-op 02/26/12 1441 02/27/12 1215        .  They were given sequential compression devices, early ambulation, and chemoprophylaxis for DVT prophylaxis. Postop day #1 dressing right shoulder was changed showing some saturation sanguineous discharge. Dressing was changed left neck incision dry with very little swelling patient felt more comfortable about soft collar was awake alert oriented. Weaned to by mouth narcotic medicines on postoperative day #1. Dressings changed left neck on postop day #2. Patient is alert oriented and ready to be discharged. He was then discharged home on postop day #2 in satisfactory condition. CT scan of the chest on unenhanced was obtained on postop day #1 demonstrating a 5.3 mm nodule right lower lobe medially. Radiology recommended followup chest CT scan in 6 to 12 months to rule out  neoplasia. They benefited maximally from their hospital stay and there were no complications.    Recent vital signs:  Filed Vitals:   02/29/12 0615  BP: 128/61  Pulse: 90  Temp: 98.9 F (37.2 C)  Resp: 18    Recent laboratory studies:  Results for orders placed during the hospital encounter of 02/27/12  CBC      Component Value Range   WBC 5.8  4.0 - 10.5 K/uL   RBC 4.21  3.87 - 5.11 MIL/uL   Hemoglobin 12.6  12.0 - 15.0 g/dL   HCT 02.7  25.3 -  66.4 %   MCV 88.1  78.0 - 100.0 fL   MCH 29.9  26.0 - 34.0 pg   MCHC 34.0  30.0 - 36.0 g/dL   RDW 40.3  47.4 - 25.9 %   Platelets 280  150 - 400 K/uL  BASIC METABOLIC PANEL      Component Value Range   Sodium 137  135 - 145 mEq/L   Potassium 4.5  3.5 - 5.1 mEq/L   Chloride 104  96 - 112 mEq/L   CO2 21  19 - 32 mEq/L   Glucose, Bld 100 (*) 70 - 99 mg/dL   BUN 19  6 - 23 mg/dL   Creatinine, Ser 5.63  0.50 - 1.10 mg/dL   Calcium 9.1  8.4 - 87.5 mg/dL   GFR calc non Af Amer 89 (*) >90 mL/min   GFR calc Af Amer >90  >90 mL/min  TYPE AND SCREEN      Component Value Range   ABO/RH(D) O POS     Antibody Screen NEG     Sample Expiration 03/01/2012    ABO/RH      Component Value Range   ABO/RH(D) O POS    HEMOGLOBIN AND HEMATOCRIT, BLOOD      Component Value Range   Hemoglobin 9.9 (*) 12.0 - 15.0 g/dL   HCT 64.3 (*) 32.9 - 51.8 %  BASIC METABOLIC PANEL      Component Value Range   Sodium 135  135 - 145 mEq/L   Potassium 4.0  3.5 - 5.1 mEq/L   Chloride 101  96 - 112 mEq/L   CO2 26  19 - 32 mEq/L   Glucose, Bld 114 (*) 70 - 99 mg/dL   BUN 16  6 - 23 mg/dL   Creatinine, Ser 8.41  0.50 - 1.10 mg/dL   Calcium 8.4  8.4 - 66.0 mg/dL   GFR calc non Af Amer >90  >90 mL/min   GFR calc Af Amer >90  >90 mL/min    Discharge Medications:   Medication List  As of 02/29/2012 10:09 AM   TAKE these medications         albuterol 108 (90 BASE) MCG/ACT inhaler   Commonly known as: PROVENTIL HFA;VENTOLIN HFA   Inhale 2 puffs into the lungs every 4 (four) hours as needed. For shortness of breath      aspirin 81 MG chewable tablet   Chew 81 mg by mouth daily.      Calcium + D 600-200 MG-UNIT Tabs   Generic drug: Calcium Carbonate-Vitamin D   Take 1 tablet by mouth 2 (two) times daily.      cholecalciferol 1000 UNITS tablet   Commonly known as: VITAMIN D   Take 1,000 Units by mouth daily.      citalopram 40 MG tablet   Commonly known as: CELEXA   Take 40 mg by mouth daily.  estrogen-methylTESTOSTERone 1.25-2.5 MG per tablet   Commonly known as: ESTRATEST   Take 1 tablet by mouth 3 (three) times a week.      EX-LAX PO   Take 6 tablets by mouth daily as needed. For constipation      fish oil-omega-3 fatty acids 1000 MG capsule   Take 1 g by mouth 2 (two) times daily.      HYDROmorphone 4 MG tablet   Commonly known as: DILAUDID   Take 1 tablet (4 mg total) by mouth every 4 (four) hours as needed.      ibuprofen 200 MG tablet   Commonly known as: ADVIL,MOTRIN   Take 200 mg by mouth every 6 (six) hours as needed. For pain      LORazepam 1 MG tablet   Commonly known as: ATIVAN   Take 1 mg by mouth every 8 (eight) hours.      methocarbamol 500 MG tablet   Commonly known as: ROBAXIN   Take 500 mg by mouth 3 (three) times daily.      morphine 15 MG tablet   Commonly known as: MSIR   Take 15 mg by mouth 4 (four) times daily.      morphine 15 MG tablet   Commonly known as: MSIR   Take 1 tablet (15 mg total) by mouth 4 (four) times daily.      multivitamin with minerals Tabs   Take 1 tablet by mouth daily.      nortriptyline 50 MG capsule   Commonly known as: PAMELOR   Take 50 mg by mouth daily.      simvastatin 20 MG tablet   Commonly known as: ZOCOR   Take 20 mg by mouth at bedtime.      temazepam 30 MG capsule   Commonly known as: RESTORIL   Take 1 capsule (30 mg total) by mouth at bedtime.      RESTORIL 30 MG capsule   Generic drug: temazepam   Take 30 mg by mouth at bedtime as needed. For sleep      traZODone 100 MG tablet   Commonly known as: DESYREL   Take 500 mg by mouth at bedtime. As prescribed by psych            Diagnostic Studies: Dg Chest 2 View  02/27/2012  *RADIOLOGY REPORT*  Clinical Data: Preoperative radiograph.  Shoulder placement. Smoker.  CHEST - 2 VIEW  Comparison: None.  Findings: Lower cervical ACDF.  Bilateral calcified breast implants are present.  The cardiopericardial silhouette is within normal limits.  No  effusion.  No airspace disease.  At the medial right lung base, there is a 5 mm pulmonary nodule which was not present on prior exams.  Prior chest radiographs were reviewed dating back to 2009 and this did not appear present. Follow-up noncontrast chest CT recommended for further assessment.  S-shaped thoracolumbar scoliosis is present.  IMPRESSION: 1.  No acute cardiopulmonary disease. 2.  5 mm right basilar pulmonary nodule.  Follow-up noncontrast chest CT recommended. These results will be called to the ordering clinician or representative by the Radiologist Assistant, and communication documented in the PACS Dashboard.  Original Report Authenticated By: Andreas Newport, M.D.   Dg Cervical Spine 1 View  02/27/2012  *RADIOLOGY REPORT*  Clinical Data: Removal of fractured T1 screw.  DG CERVICAL SPINE - 1 VIEW  Comparison: 12/01/2011.  Findings: There is poor visualization of T1.  The ACDF plate and the C5 and C6 screws remain in place.  Suspect that the protruding screw at T1 has been removed although this is suboptimally visualized.  Endotracheal tube is present anteriorly.  IMPRESSION: Probable interval removal of the portion of the T1 ACDF screw.  Original Report Authenticated By: Andreas Newport, M.D.   Ct Chest Wo Contrast  02/28/2012  *RADIOLOGY REPORT*  Clinical Data: Evaluate pulmonary nodule  CT CHEST WITHOUT CONTRAST  Technique:  Multidetector CT imaging of the chest was performed following the standard protocol without IV contrast.  Comparison: 02/27/2012  Findings: Prior right shoulder arthroplasty.  Bilateral breast implants with calcification of the capsule.  There is a gas and fluid collections within the right chest wall.  This measures approximately 10.8 x 3.7 x 6.6 cm.  There is no mediastinal or hilar adenopathy.  Small bilateral pleural effusions noted.  There is atelectasis identified within both lung bases.  Pulmonary nodule within the right lower lobe measures 5.3 mm, image 29.  Review  of the visualized osseous structures demonstrate multilevel thoracic spondylosis and mild scoliosis.  Anterior side plate screw device is noted within the lower cervical spine.  IMPRESSION:  1.  Small nodule in the right lower lobe measures 5.3 mm. If the patient is at high risk for bronchogenic carcinoma, follow-up chest CT at 6-12 months is recommended.  If the patient is at low risk for bronchogenic carcinoma, follow-up chest CT at 12 months is recommended.  This recommendation follows the consensus statement: Guidelines for Management of Small Pulmonary Nodules Detected on CT Scans: A Statement from the Fleischner Society as published in Radiology 2005; 237:395-400. 2.  Small bilateral pleural effusions and bibasilar atelectasis. 3.  Gas and fluid collection within the right superior chest wall is nonspecific in the early postoperative period.  This may represent a hematoma or less likely abscess.  Careful clinical correlation is advised.  Original Report Authenticated By: Rosealee Albee, M.D.   Dg Shoulder Right Port  02/27/2012  *RADIOLOGY REPORT*  Clinical Data: Right shoulder replacement.  PORTABLE RIGHT SHOULDER - 2+ VIEW  Comparison: CT right shoulder 12/01/2011.  Findings: The patient has a new right shoulder hemiarthroplasty. Old surgical neck fracture is noted.  Acromioclavicular degenerative change is seen.  No new fracture.  IMPRESSION: Right shoulder replacement without evidence of complication.  Original Report Authenticated By: Bernadene Bell. Maricela Curet, M.D.    Disposition: 01-Home or Self Care  Discharge Orders    Future Orders Please Complete By Expires   Diet - low sodium heart healthy      Call MD / Call 911      Comments:   If you experience chest pain or shortness of breath, CALL 911 and be transported to the hospital emergency room.  If you develope a fever above 101 F, pus (white drainage) or increased drainage or redness at the wound, or calf pain, call your surgeon's office.    Constipation Prevention      Comments:   Drink plenty of fluids.  Prune juice may be helpful.  You may use a stool softener, such as Colace (over the counter) 100 mg twice a day.  Use MiraLax (over the counter) for constipation as needed.   Increase activity slowly as tolerated      Discharge instructions      Comments:   Keep the dressing is dry both right shoulder and left neck for at least 4 days. Then he may remove dressings and the areas. May use new dry dressing then. Call and schedule an appointment to be seen in  our office in 2 weeks from the time of surgery. You may use ice to the right shoulder and left neck as needed for discomfort. Avoid turning the right arm outwards away from the chest and avoid lifting weights greater than 1 pound with her right arm. Do not actively lift the right arm in order to allow for healing of the recently repaired tendons of the right shoulder. Use sling when out of bed. Use a pillow behind the right upper arm to hold the right shoulder and arm forward when lying down or in a recliner. Call if there is any increasing drainage erythema fever or chills. Careful with the use of medicines and mixing of narcotic medicines. Tried to stagger the use of medicines to prevent additive sedative effect I could potentially cause respiratory failure. Avoidance of  smoking.   Lifting restrictions      Comments:   No lifting for 6 weeks   Driving restrictions      Comments:   No driving for 4 weeks      Follow-up Information    Follow up with Thomasina Housley E, MD. Schedule an appointment as soon as possible for a visit in 2 weeks.   Contact information:   [employer] [address] [place] [address] [phone]           Signed: Kerrin Champagne 02/29/2012, 10:09 AM

## 2012-02-29 NOTE — Progress Notes (Signed)
Subjective: 2 Days Post-Op Procedure(s) (LRB): SHOULDER HEMI-ARTHROPLASTY (Right) HARDWARE REMOVAL () patient is awake alert oriented x4 her appears comfortable with use of the Dilaudid for relief of pain. Patient reports pain as 5 on 0-10 scale.    Objective: Vital signs in last 24 hours: Temp:  [98.4 F (36.9 C)-98.9 F (37.2 C)] 98.9 F (37.2 C) (06/16 0615) Pulse Rate:  [75-90] 90  (06/16 0615) Resp:  [16-18] 18  (06/16 0615) BP: (116-128)/(54-61) 128/61 mmHg (06/16 0615) SpO2:  [95 %-98 %] 98 % (06/16 0615)  Intake/Output from previous day: 06/15 0701 - 06/16 0700 In: 660 [P.O.:660] Out: 600 [Urine:600] Intake/Output this shift: Total I/O In: 240 [P.O.:240] Out: -    Basename 02/28/12 0600 02/27/12 0909  HGB 9.9* 12.6    Basename 02/28/12 0600 02/27/12 0909  WBC -- 5.8  RBC -- 4.21  HCT 29.1* 37.1  PLT -- 280    Basename 02/28/12 0600 02/27/12 0909  NA 135 137  K 4.0 4.5  CL 101 104  CO2 26 21  BUN 16 19  CREATININE 0.70 0.76  GLUCOSE 114* 100*  CALCIUM 8.4 9.1   No results found for this basename: LABPT:2,INR:2 in the last 72 hours  Neurologically intact ABD soft Neurovascular intact Sensation intact distally Intact pulses distally Incision: no drainage Incision left neck dry without swelling the dressing applied her right shoulder dressing is dry she does have some swelling and ecchymosis use ice locally.  Assessment/Plan: 2 Days Post-Op Procedure(s) (LRB): SHOULDER HEMI-ARTHROPLASTY (Right) HARDWARE REMOVAL ()   Advance diet D/C IV fluids Discharge home with home health plan is to see this patient back in 2 weeks at which time we will start therapy likely will require home health she is unable to drive.  Quenton Recendez E 02/29/2012, 9:56 AM

## 2012-03-01 ENCOUNTER — Encounter (HOSPITAL_COMMUNITY): Payer: Self-pay | Admitting: Specialist

## 2012-03-01 MED FILL — Diphenhydramine HCl Inj 50 MG/ML: INTRAMUSCULAR | Qty: 1 | Status: AC

## 2012-03-07 ENCOUNTER — Emergency Department (HOSPITAL_COMMUNITY)
Admission: EM | Admit: 2012-03-07 | Discharge: 2012-03-08 | Disposition: A | Discharge status: Home / Self Care | Payer: Medicare Other | Attending: Emergency Medicine | Admitting: Emergency Medicine

## 2012-03-07 ENCOUNTER — Encounter (HOSPITAL_COMMUNITY): Payer: Self-pay | Admitting: Emergency Medicine

## 2012-03-07 DIAGNOSIS — K219 Gastro-esophageal reflux disease without esophagitis: Secondary | ICD-10-CM | POA: Insufficient documentation

## 2012-03-07 DIAGNOSIS — R45851 Suicidal ideations: Secondary | ICD-10-CM | POA: Insufficient documentation

## 2012-03-07 DIAGNOSIS — F329 Major depressive disorder, single episode, unspecified: Secondary | ICD-10-CM | POA: Insufficient documentation

## 2012-03-07 DIAGNOSIS — E7800 Pure hypercholesterolemia, unspecified: Secondary | ICD-10-CM | POA: Insufficient documentation

## 2012-03-07 DIAGNOSIS — J45909 Unspecified asthma, uncomplicated: Secondary | ICD-10-CM | POA: Insufficient documentation

## 2012-03-07 DIAGNOSIS — M810 Age-related osteoporosis without current pathological fracture: Secondary | ICD-10-CM | POA: Insufficient documentation

## 2012-03-07 DIAGNOSIS — F3289 Other specified depressive episodes: Secondary | ICD-10-CM | POA: Insufficient documentation

## 2012-03-07 DIAGNOSIS — Z79899 Other long term (current) drug therapy: Secondary | ICD-10-CM | POA: Insufficient documentation

## 2012-03-07 DIAGNOSIS — F17200 Nicotine dependence, unspecified, uncomplicated: Secondary | ICD-10-CM | POA: Insufficient documentation

## 2012-03-07 DIAGNOSIS — F32A Depression, unspecified: Secondary | ICD-10-CM

## 2012-03-07 DIAGNOSIS — F411 Generalized anxiety disorder: Secondary | ICD-10-CM | POA: Insufficient documentation

## 2012-03-07 DIAGNOSIS — I10 Essential (primary) hypertension: Secondary | ICD-10-CM | POA: Insufficient documentation

## 2012-03-07 LAB — RAPID URINE DRUG SCREEN, HOSP PERFORMED
Amphetamines: NOT DETECTED
Barbiturates: NOT DETECTED
Opiates: POSITIVE — AB
Tetrahydrocannabinol: NOT DETECTED

## 2012-03-07 LAB — COMPREHENSIVE METABOLIC PANEL
ALT: 10 U/L (ref 0–35)
AST: 16 U/L (ref 0–37)
Albumin: 3.9 g/dL (ref 3.5–5.2)
Alkaline Phosphatase: 41 U/L (ref 39–117)
CO2: 23 mEq/L (ref 19–32)
Chloride: 103 mEq/L (ref 96–112)
Potassium: 3.7 mEq/L (ref 3.5–5.1)
Sodium: 138 mEq/L (ref 135–145)
Total Bilirubin: 0.1 mg/dL — ABNORMAL LOW (ref 0.3–1.2)

## 2012-03-07 LAB — CBC
Platelets: 462 10*3/uL — ABNORMAL HIGH (ref 150–400)
RBC: 3.47 MIL/uL — ABNORMAL LOW (ref 3.87–5.11)
RDW: 15 % (ref 11.5–15.5)
WBC: 8.9 10*3/uL (ref 4.0–10.5)

## 2012-03-07 MED ORDER — OXYCODONE-ACETAMINOPHEN 5-325 MG PO TABS
1.0000 | ORAL_TABLET | ORAL | Status: DC | PRN
  Administered 2012-03-07 – 2012-03-08 (×2): 1 via ORAL
  Filled 2012-03-07 (×2): qty 1

## 2012-03-07 MED ORDER — LORAZEPAM 1 MG PO TABS
1.0000 mg | ORAL_TABLET | Freq: Three times a day (TID) | ORAL | Status: DC | PRN
  Administered 2012-03-08: 1 mg via ORAL
  Filled 2012-03-07: qty 1

## 2012-03-07 MED ORDER — ALUM & MAG HYDROXIDE-SIMETH 200-200-20 MG/5ML PO SUSP: 30.0000 mL | ORAL | Status: DC | PRN

## 2012-03-07 MED ORDER — ZOLPIDEM TARTRATE 5 MG PO TABS
5.0000 mg | ORAL_TABLET | Freq: Every evening | ORAL | Status: DC | PRN
  Administered 2012-03-08: 5 mg via ORAL
  Filled 2012-03-07: qty 1

## 2012-03-07 MED ORDER — NICOTINE 21 MG/24HR TD PT24: 21.0000 mg | MEDICATED_PATCH | Freq: Every day | TRANSDERMAL | Status: DC

## 2012-03-07 MED ORDER — ONDANSETRON HCL 4 MG PO TABS: 4.0000 mg | ORAL_TABLET | Freq: Three times a day (TID) | ORAL | Status: DC | PRN

## 2012-03-07 NOTE — ED Notes (Signed)
MD at bedside. 

## 2012-03-07 NOTE — ED Notes (Signed)
Pt alert, nad, arrives via [employer], c/o SI, resp even unlabored, skin pwd, denies plan, states hx of depression

## 2012-03-07 NOTE — ED Notes (Signed)
telepsych faxed, await

## 2012-03-07 NOTE — BH Assessment (Signed)
Assessment Note   [name] is an 61 y.o. female who presents voluntarily to [facility] via [employer]. Pt endorses SI with no specific plan. She has no prior suicide attempts. She had surgery on right shoulder on 02/27/12. She describes mood as "angry and depressed". Depressive symptoms include fatigue, tearfulness, worthlessness, anger, and loss of interest in usual pleasures. Pt says she has no family or friends. She endorses severe anxiety with panic attacks but wouldn't elaborate on attack frequency.  She sees Dr. Evelene Croon for depression. Pt's affect is depressed and irritable. She speaks loudly and is argumentative. Pt denies HI and AVH. Pt answers "sometimes" when writer asked if people were out to get her and pt refuses to elaborate. 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 She refused to give details re: her past alcohol use. BAL is 48.  Telepsych consult has been ordered by EDP.  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 Anxiety   . Depression   . High cholesterol   . Chronic pain   . Osteoporosis   . Hypertension     resolved whn she stopped drinking alcohol  . Asthma   . GERD (gastroesophageal reflux disease)   . Constipation   . Osteopenia   . Mitral prolapse     dx as a child   Axis IV: other psychosocial or environmental problems, problems related to social environment and problems with primary support group Axis V: 31-40 impairment in reality testing  Past Medical History:  Past Medical History  Diagnosis Date  . Anxiety   . Depression   . High cholesterol   . Chronic pain   . Osteoporosis   . Hypertension     resolved whn she stopped drinking alcohol  . Asthma   . GERD (gastroesophageal reflux disease)   . Constipation   . Osteopenia   . Mitral prolapse     dx as a child    Past Surgical History  Procedure  Date  . Right femur replacement   . Rotator cuff repair     right  . Neck surgery     plates and screws  . Back surgery   . Abdominal hysterectomy   . Wrist surgery     tendon repair from a dog bite- left  . Orif wrist fracture 1986    right  . Left hip replacement 02-27-12    just screw - no replacement  . Shoulder hemi-arthroplasty 02/27/2012    Procedure: SHOULDER HEMI-ARTHROPLASTY;  Surgeon: Kerrin Champagne, MD;  Location: [facility] OR;  Service: Orthopedics;  Laterality: Right;  Right shoulder hemiarthroplasty, repair right rotator cuff  . Hardware removal 02/27/2012    Procedure: HARDWARE REMOVAL;  Surgeon: Kerrin Champagne, MD;  Location: [facility] OR;  Service: Orthopedics;;  exploration and removal of portion of screw at T-!    Family History: No family history on file.  Social History:  reports that she has been smoking.  She does not have any smokeless tobacco history on file. She reports that she does not drink alcohol or use illicit drugs.  Additional Social History:  Alcohol / Drug Use Pain Medications: pt denies abuse Prescriptions: pt denies abuse Over the Counter: takes as directed History of alcohol / drug use?: Yes Longest period of sobriety (when/how long): 18 years Substance #1 Name of Substance 1: alcohol 1 -  Age of First Use: 16 1 - Frequency: states she used to have problem with alcohol but wouldn't elaborate 1 - Duration: hadn't had a drink in 18 years until today 6/23 1 - Last Use / Amount: 03/07/12 - states she had "one drink" - BAL 48  CIWA: CIWA-Ar BP: 135/67 mmHg Pulse Rate: 93  COWS:    Allergies:  Allergies  Allergen Reactions  . Penicillins   . Propoxyphene-Acetaminophen   . Vancomycin Cross Reactors Rash    Home Medications:  (Not in a hospital admission)  OB/GYN Status:  No LMP recorded. Patient has had a hysterectomy.  General Assessment Data Location of Assessment: [facility] ED Living Arrangements: Alone Can pt return to current living arrangement?:  Yes Admission Status: Voluntary Is patient capable of signing voluntary admission?: Yes Transfer from: Acute Hospital Referral Source: Other ([employer])  Education Status Is patient currently in school?: No Current Grade: N/A Highest grade of school patient has completed: 31 Name of school: [employer] person: n/a  Risk to self Suicidal Ideation: Yes-Currently Present Is patient at risk for suicide?: Yes Suicidal Plan?: No Access to Means: No What has been your use of drugs/alcohol within the last 12 months?: none Previous Attempts/Gestures: No How many times?: 0  Other Self Harm Risks: n/a Triggers for Past Attempts:  (n/a) Intentional Self Injurious Behavior: None Family Suicide History: No Recent stressful life event(s): Other (Comment) (states a lot of stressors but didn't elaborate) Persecutory voices/beliefs?: No Depression: Yes Depression Symptoms: Despondent;Tearfulness;Loss of interest in usual pleasures;Feeling worthless/self pity;Feeling angry/irritable;Fatigue Substance abuse history and/or treatment for substance abuse?: No Suicide prevention information given to non-admitted patients: Not applicable  Risk to Others Homicidal Ideation: No Thoughts of Harm to Others: No Current Homicidal Intent: No Current Homicidal Plan: No Access to Homicidal Means: No Identified Victim: none History of harm to others?: No Assessment of Violence: None Noted Violent Behavior Description: none Does patient have access to weapons?: No Criminal Charges Pending?: No Does patient have a court date: No  Psychosis Hallucinations: None noted Delusions: None noted  Mental Status Report Appear/Hygiene: Other (Comment) (unremarkable) Eye Contact: Poor (pt turned toward wall) Motor Activity: Freedom of movement Speech: Argumentative;Logical/coherent;Loud Level of Consciousness: Alert;Irritable Mood: Depressed;Irritable Affect: Irritable;Depressed Anxiety Level: Panic  Attacks Panic attack frequency: varies Most recent panic attack: pt didn't answer question re: most recent attack Thought Processes: Coherent;Relevant Judgement: Impaired Orientation: Person;Place;Time;Situation Obsessive Compulsive Thoughts/Behaviors: None  Cognitive Functioning Concentration: Decreased Memory: Recent Intact;Remote Intact IQ: Average Insight: Poor Impulse Control: Fair Appetite: Poor Weight Loss: 25  (in one yr) Weight Gain: 0  Sleep: No Change Total Hours of Sleep: 9  Vegetative Symptoms: None  ADLScreening [facility] Assessment Services) Patient's cognitive ability adequate to safely complete daily activities?: Yes Patient able to express need for assistance with ADLs?: Yes Independently performs ADLs?: Yes  Abuse/Neglect [facility]) Physical Abuse: Denies Verbal Abuse: Denies Sexual Abuse: Denies  Prior Inpatient Therapy Prior Inpatient Therapy: No Prior Therapy Dates: n/a Prior Therapy Facilty/Provider(s): n/a Reason for Treatment: n/a  Prior Outpatient Therapy Prior Outpatient Therapy: Yes Prior Therapy Dates: currently Prior Therapy Facilty/Provider(s): Dr. Evelene Croon (psych_ Reason for Treatment: depression  ADL Screening (condition at time of admission) Patient's cognitive ability adequate to safely complete daily activities?: Yes Patient able to express need for assistance with ADLs?: Yes Independently performs ADLs?: Yes Weakness of Legs: None Weakness of Arms/Hands: None  Home Assistive Devices/Equipment Home Assistive Devices/Equipment: Eyeglasses    Abuse/Neglect Assessment (Assessment to be complete while patient is  alone) Physical Abuse: Denies Verbal Abuse: Denies Sexual Abuse: Denies Exploitation of patient/patient's resources: Denies Self-Neglect: Denies Values / Beliefs Cultural Requests During Hospitalization: None Spiritual Requests During Hospitalization: None        Additional Information 1:1 In Past 12 Months?: No CIRT Risk:  No Elopement Risk: No Does patient have medical clearance?: Yes     Disposition:  Disposition Disposition of Patient: Inpatient treatment program;Outpatient treatment;Referred to Type of inpatient treatment program: Adult Patient referred to: Other (Comment) (Dr. Evelene Croon)  On Site Evaluation by:   Reviewed with Physician:     Thornell Sartorius 03/07/2012 11:02 PM

## 2012-03-07 NOTE — ED Notes (Signed)
Lab bedside.

## 2012-03-07 NOTE — ED Provider Notes (Signed)
History     CSN: [id number]  Arrival date & time 03/07/12  4098   First MD Initiated Contact with Patient 03/07/12 2021      Chief Complaint  Patient presents with  . Medical Clearance    HPI  History provided by the patient. Patient is a 61 year old female with history of hypertension, hypercholesterolemia, recent right shoulder surgery, anxiety and depression who presents with complaints of increased depression and suicidal ideations today. Patient states that she lives alone and has no family or friends nearby and was feeling increased depression today. Patient states she feels lonely and has no one to liver. Patient recently had right shoulder surgery one week ago and feels helpless at home. Patient denies having any specific plan but states that she "would find a way". Patient is not on any firearms or weapons. Patient reports having similar symptoms in the past. Patient is on medications for depression and is followed by Dr. Chestine Spore. Patient states she's been taking these as prescribed. Patient denies any other aggravating or alleviating factors. Patient denies any hallucinations, HI.   Past Medical History  Diagnosis Date  . Anxiety   . Depression   . High cholesterol   . Chronic pain   . Osteoporosis   . Hypertension     resolved whn she stopped drinking alcohol  . Asthma   . GERD (gastroesophageal reflux disease)   . Constipation   . Osteopenia   . Mitral prolapse     dx as a child    Past Surgical History  Procedure Date  . Right femur replacement   . Rotator cuff repair     right  . Neck surgery     plates and screws  . Back surgery   . Abdominal hysterectomy   . Wrist surgery     tendon repair from a dog bite- left  . Orif wrist fracture 1986    right  . Left hip replacement 02-27-12    just screw - no replacement  . Shoulder hemi-arthroplasty 02/27/2012    Procedure: SHOULDER HEMI-ARTHROPLASTY;  Surgeon: Kerrin Champagne, MD;  Location: [facility] OR;  Service:  Orthopedics;  Laterality: Right;  Right shoulder hemiarthroplasty, repair right rotator cuff  . Hardware removal 02/27/2012    Procedure: HARDWARE REMOVAL;  Surgeon: Kerrin Champagne, MD;  Location: [facility] OR;  Service: Orthopedics;;  exploration and removal of portion of screw at T-!    No family history on file.  History  Substance Use Topics  . Smoking status: Current Some Day Smoker -- 0.7 packs/day for 50 years  . Smokeless tobacco: Not on file  . Alcohol Use: No    OB History    Grav Para Term Preterm Abortions TAB SAB Ect Mult Living                  Review of Systems  Constitutional: Negative for fever and chills.  Respiratory: Negative for cough and shortness of breath.   Cardiovascular: Negative for chest pain.  Gastrointestinal: Negative for nausea and vomiting.  Skin: Negative for rash.  Psychiatric/Behavioral: Positive for suicidal ideas. Negative for self-injury.    Allergies  Penicillins; Propoxyphene-acetaminophen; and Vancomycin cross reactors  Home Medications   Current Outpatient Rx  Name Route Sig Dispense Refill  . ALBUTEROL SULFATE HFA 108 (90 BASE) MCG/ACT IN AERS Inhalation Inhale 2 puffs into the lungs every 4 (four) hours as needed. For shortness of breath    . ASPIRIN 81 MG PO CHEW Oral Chew  81 mg by mouth daily.     Marland Kitchen CALCIUM + D 600-200 MG-UNIT PO TABS Oral Take 1 tablet by mouth 2 (two) times daily.     Marland Kitchen VITAMIN D 1000 UNITS PO TABS Oral Take 1,000 Units by mouth daily.    Marland Kitchen CITALOPRAM HYDROBROMIDE 40 MG PO TABS Oral Take 40 mg by mouth daily.     Marland Kitchen EST ESTROGENS-METHYLTEST 1.25-2.5 MG PO TABS Oral Take 1 tablet by mouth 3 (three) times a week.    . OMEGA-3 FATTY ACIDS 1000 MG PO CAPS Oral Take 1 g by mouth 2 (two) times daily.    Marland Kitchen HYDROMORPHONE HCL 4 MG PO TABS Oral Take 1 tablet (4 mg total) by mouth every 4 (four) hours as needed. 40 tablet 0  . IBUPROFEN 200 MG PO TABS Oral Take 200 mg by mouth every 6 (six) hours as needed. For pain    .  LORAZEPAM 1 MG PO TABS Oral Take 1 mg by mouth every 8 (eight) hours.     . MORPHINE SULFATE 15 MG PO TABS Oral Take 15 mg by mouth 4 (four) times daily.     . ADULT MULTIVITAMIN W/MINERALS CH Oral Take 1 tablet by mouth daily.    Marland Kitchen NORTRIPTYLINE HCL 50 MG PO CAPS Oral Take 50 mg by mouth daily.    Marland Kitchen EX-LAX PO Oral Take 6 tablets by mouth daily as needed. For constipation    . SIMVASTATIN 20 MG PO TABS Oral Take 20 mg by mouth at bedtime.     . TRAZODONE HCL 100 MG PO TABS Oral Take 500 mg by mouth at bedtime. As prescribed by psych    . TEMAZEPAM 30 MG PO CAPS Oral Take 1 capsule (30 mg total) by mouth at bedtime. 30 capsule 0    BP 135/67  Pulse 93  Temp 98.9 F (37.2 C) (Oral)  Resp 20  SpO2 98%  Physical Exam  Nursing note and vitals reviewed. Constitutional: She is oriented to person, place, and time. She appears well-developed and well-nourished. No distress.  HENT:  Head: Normocephalic.  Cardiovascular: Normal rate and regular rhythm.   Pulmonary/Chest: Effort normal and breath sounds normal. No respiratory distress. She has no wheezes.  Abdominal: Soft. There is no tenderness.  Musculoskeletal:       Well healing incision along right anterior shoulder. No erythema or signs of infection.  Neurological: She is alert and oriented to person, place, and time.  Skin: Skin is warm and dry.  Psychiatric: Her behavior is normal. She is not actively hallucinating. She exhibits a depressed mood. She expresses suicidal ideation. She expresses no homicidal ideation. She expresses no suicidal plans.    ED Course  Procedures   Results for orders placed during the hospital encounter of 03/07/12  CBC      Component Value Range   WBC 8.9  4.0 - 10.5 K/uL   RBC 3.47 (*) 3.87 - 5.11 MIL/uL   Hemoglobin 10.7 (*) 12.0 - 15.0 g/dL   HCT 11.9 (*) 14.7 - 82.9 %   MCV 88.8  78.0 - 100.0 fL   MCH 30.8  26.0 - 34.0 pg   MCHC 34.7  30.0 - 36.0 g/dL   RDW 56.2  13.0 - 86.5 %   Platelets 462  (*) 150 - 400 K/uL  COMPREHENSIVE METABOLIC PANEL      Component Value Range   Sodium 138  135 - 145 mEq/L   Potassium 3.7  3.5 - 5.1 mEq/L  Chloride 103  96 - 112 mEq/L   CO2 23  19 - 32 mEq/L   Glucose, Bld 88  70 - 99 mg/dL   BUN 12  6 - 23 mg/dL   Creatinine, Ser 1.61  0.50 - 1.10 mg/dL   Calcium 9.4  8.4 - 09.6 mg/dL   Total Protein 7.0  6.0 - 8.3 g/dL   Albumin 3.9  3.5 - 5.2 g/dL   AST 16  0 - 37 U/L   ALT 10  0 - 35 U/L   Alkaline Phosphatase 41  39 - 117 U/L   Total Bilirubin 0.1 (*) 0.3 - 1.2 mg/dL   GFR calc non Af Amer >90  >90 mL/min   GFR calc Af Amer >90  >90 mL/min  ETHANOL      Component Value Range   Alcohol, Ethyl (B) 48 (*) 0 - 11 mg/dL  URINE RAPID DRUG SCREEN (HOSP PERFORMED)      Component Value Range   Opiates POSITIVE (*) NONE DETECTED   Cocaine NONE DETECTED  NONE DETECTED   Benzodiazepines POSITIVE (*) NONE DETECTED   Amphetamines NONE DETECTED  NONE DETECTED   Tetrahydrocannabinol NONE DETECTED  NONE DETECTED   Barbiturates NONE DETECTED  NONE DETECTED       1. Depression       MDM  Patient seen and evaluated. Patient in no acute distress.  Will plan to consult tele psych.  Patient was seen in consultation by Dr. Jacky Kindle psychiatry. This time patient felt able to return home and continue current therapy and treatment with doctors.    Phill Mutter [place], [place] 03/08/12 [phone]

## 2012-03-07 NOTE — ED Notes (Signed)
Pt ambulates to restroom

## 2012-03-07 NOTE — ED Notes (Signed)
TelePsych contacted, instructed to place computer in pt room, will contact when available

## 2012-03-07 NOTE — ED Notes (Signed)
reply

## 2012-03-08 NOTE — ED Notes (Signed)
Discharge may be post phoned til morning per Ivonne Andrew, as pt doesn't have transportation at this time and has been medicated for anxiety and insomnia.

## 2012-03-08 NOTE — Discharge Instructions (Signed)
You were seen and evaluated for your symptoms of depression. You were also evaluated by psychiatrist, Dr. Jacky Kindle. At this time it is felt that you were able to return home and continue your followup with your doctors for your depression symptoms. Please continue your medications as prescribed. Return to the emergency room if you develop any worsening symptoms.   Depression, Adolescent and Adult Depression is a true and treatable medical condition. In general there are two kinds of depression:  Depression we all experience in some form. For example depression from the death of a loved one, financial distress or natural disasters will trigger or increase depression.   Clinical depression, on the other hand, appears without an apparent cause or reason. This depression is a disease. Depression may be caused by chemical imbalance in the body and brain or may come as a response to a physical illness. Alcohol and other drugs can cause depression.  DIAGNOSIS  The diagnosis of depression is usually based upon symptoms and medical history. TREATMENT  Treatments for depression fall into three categories. These are:  Drug therapy. There are many medicines that treat depression. Responses may vary and sometimes trial and error is necessary to determine the best medicines and dosage for a particular patient.   Psychotherapy, also called talking treatments, helps people resolve their problems by looking at them from a different point of view and by giving people insight into their own personal makeup. Traditional psychotherapy looks at a childhood source of a problem. Other psychotherapy will look at current conflicts and move toward solving those. If the cause of depression is drug use, counseling is available to help abstain. In time the depression will usually improve. If there were underlying causes for the chemical use, they can be addressed.   ECT (electroconvulsive therapy) or shock treatment is not as  commonly used today. It is a very effective treatment for severe suicidal depression. During ECT electrical impulses are applied to the head. These impulses cause a generalized seizure. It can be effective but causes a loss of memory for recent events. Sometimes this loss of memory may include the last several months.  Treat all depression or suicide threats as serious. Obtain professional help. Do not wait to see if serious depression will get better over time without help. Seek help for yourself or those around you. In the [place] the number to the National Suicide Help Lines With 24 Hour Help Are: 1-800-SUICIDE 302-144-7609 Document Released: 08/29/2000 Document Revised: 08/21/2011 Document Reviewed: 04/19/2008 [facility] Patient Information 2012 [place], [place].    Suicidal Feelings, How to Help Yourself Everyone feels sad or unhappy at times, but depressing thoughts and feelings of hopelessness can lead to thoughts of suicide. It can seem as if life is too tough to handle. It is as if the mountain is just too high and your climbing skills are not great enough. At that moment these dark thoughts and feelings may seem overwhelming and never ending. It is important to remember these feelings are temporary! They will go away. If you feel as though you have reached the point where suicide is the only answer, it is time to let someone know immediately. This is the first step to feeling better. The following steps will move you to safer ground and lead you in a positive direction out of depression. HOW TO COPE AND PREVENT SUICIDE  Let family, friends, teachers and/or counselors know. Get help. Try not to isolate yourself from those who care about you. Even though  you may not feel sociable or think that you are not good company, talk with someone everyday. It is best if it is face to face. Remember, they will want to help you.   Eat a regularly spaced and well-balanced diet, and get plenty of rest.    Avoid alcohol and drugs because they will only make you feel worse and may also lower your inhibitions. Remove them from the home. If you are thinking of taking an overdose of your prescribed medications, give your medicines to someone who can give them to you one day at a time. If you are on antidepressants, let your caregiver know of your feelings so he or she can provide a safer medication, if that is a concern.   Remove weapons or poisons from your home.   Try to stick to routines. That may mean just walking the dog or feeding the cat. Follow a schedule and remind yourself that you have to keep that schedule every day. Play with your pets. If it is possible, and you do not have a pet, get one. They give you a sense of well-being, lower your blood pressure and make your heart feel good. They need you, and we all want to be needed.   Set some realistic goals and achieve them. Make a list and cross things off as you go. Accomplishments give a sense of worth. Wait until you are feeling better before doing things you find difficult or unpleasant to do.   If you are able, try to start exercising. Even half-hour periods of exercise each day will make you feel better. Getting out in the sun or into nature helps you recover from depression faster. If you have a favorite place to walk, take advantage of that.   Increase safe activities that have always given you pleasure. This may include playing your favorite music, reading a good book, painting a picture or playing your favorite instrument. Do whatever takes your mind off your depression and puts a smile on your face.   Keep your living space well lit with windows open, and let the sun shine in. Bright light definitely treats depression, not just people with the seasonal affective disorders (SAD).  Above all else remember, depression is temporary. It will go away. Do not contemplate suicide. Death as a permanent solution is not the answer. Suicide will  take away the beautiful rest of life, and do lifelong harm to those around you who love you. Help is available. National Suicide Help Lines with 24 hour help are: 1-800-SUICIDE 458-401-7385 Document Released: 03/08/2003 Document Revised: 08/21/2011 Document Reviewed: 07/27/2007 [facility] Patient Information 2012 [place], [place].

## 2012-03-08 NOTE — ED Provider Notes (Signed)
Pt alert, content, eating breakfast. Smiling, conversant. telepsych has recommended d/c home. Pt agreeable w plan, states feels ready for d/c.  No SI.    Suzi Roots, MD 03/08/12 [phone]

## 2012-03-09 NOTE — ED Provider Notes (Signed)
Medical screening examination/treatment/procedure(s) were performed by non-physician practitioner and as supervising physician I was immediately available for consultation/collaboration.  Brocha Gilliam L Jenice Leiner, MD 03/09/12 0717 

## 2012-05-19 ENCOUNTER — Institutional Professional Consult (permissible substitution): Payer: Medicare Other | Admitting: Internal Medicine

## 2012-05-21 ENCOUNTER — Ambulatory Visit (INDEPENDENT_AMBULATORY_CARE_PROVIDER_SITE_OTHER): Payer: Medicare Other | Admitting: Pulmonary Disease

## 2012-05-21 ENCOUNTER — Encounter: Payer: Self-pay | Admitting: Pulmonary Disease

## 2012-05-21 VITALS — BP 110/72 | HR 92 | Temp 98.9°F | Ht 69.0 in | Wt 124.8 lb

## 2012-05-21 DIAGNOSIS — R911 Solitary pulmonary nodule: Secondary | ICD-10-CM

## 2012-05-21 NOTE — Progress Notes (Signed)
  Subjective:    Patient ID: [name], female    DOB: Nov 29, 1950, 61 y.o.   MRN: [id number]  HPI The patient is a 61 year old female who I have been asked to C. For an abnormal CT chest.  She recently had a shoulder injury, and was noted on CT to have a right lower lobe nodule.  In June of this year she underwent a full CT chest that showed no abnormalities other than a 5 mm nodule in the right lower lobe, tiny pleural effusions, and atelectasis.  The patient has never lived in [place] or [place], and has no history of TB exposure that she knows of.  She has worked as a Engineer, civil (consulting), but has always had a negative TB skin test.  She denies any pulmonary symptoms since she has quit smoking in March.  There is no cough, mucus, chest pain, or hemoptysis.  She has no personal history of cancer, and tells me that her health maintenance is up to date.    Review of Systems  Constitutional: Negative for fever and unexpected weight change.  HENT: Negative for ear pain, nosebleeds, congestion, sore throat, rhinorrhea, sneezing, trouble swallowing, dental problem, postnasal drip and sinus pressure.   Eyes: Negative for redness and itching.  Respiratory: Negative for cough, chest tightness, shortness of breath and wheezing.   Cardiovascular: Positive for leg swelling. Negative for palpitations.  Gastrointestinal: Negative for nausea and vomiting.  Genitourinary: Negative for dysuria.  Musculoskeletal: Positive for joint swelling.  Skin: Negative for rash.  Neurological: Positive for headaches.  Hematological: Bruises/bleeds easily.  Psychiatric/Behavioral: Positive for dysphoric mood. The patient is nervous/anxious.        Objective:   Physical Exam Constitutional:  Well developed, no acute distress  HENT:  Nares patent without discharge  Oropharynx without exudate, palate and uvula are normal  Eyes:  Perrla, eomi, no scleral icterus  Neck:  No JVD, no TMG  Cardiovascular:  Normal rate,  regular rhythm, no rubs or gallops.  No murmurs        Intact distal pulses  Pulmonary :  Normal breath sounds, no stridor or respiratory distress   No rales, rhonchi, or wheezing  Abdominal:  Soft, nondistended, bowel sounds present.  No tenderness noted.   Musculoskeletal:  No lower extremity edema noted.  Lymph Nodes:  No cervical lymphadenopathy noted  Skin:  No cyanosis noted  Neurologic:  Alert, appropriate, moves all 4 extremities without obvious deficit.         Assessment & Plan:

## 2012-05-21 NOTE — Patient Instructions (Addendum)
Will set up for a followup 6mos ct chest.  That will be in Dec.   Stay off cigarettes.  You are doing great.  Will call you with the results of your scan.

## 2012-05-21 NOTE — Assessment & Plan Note (Signed)
The patient has a 5 mm pulmonary nodule in her right lower lobe that is most likely benign.  She does have an extensive smoking history, and there've been falls into a high risk group overall.  By Fleischner criteria, she would need followup ct in 6mos from original.  I've explained to her the nodule is too small to biopsy, and cannot be adequately characterized by PET.  The only way to biopsy this area is by vats resection, and this would be overkill.  I have recommended a followup CT in December of this year, and we'll call her with the results.  I have stressed to her the importance of continuing with her smoking cessation.

## 2012-05-25 ENCOUNTER — Telehealth: Payer: Self-pay | Admitting: Pulmonary Disease

## 2012-05-25 ENCOUNTER — Other Ambulatory Visit: Payer: Self-pay | Admitting: Pulmonary Disease

## 2012-05-25 DIAGNOSIS — Z1231 Encounter for screening mammogram for malignant neoplasm of breast: Secondary | ICD-10-CM

## 2012-05-25 DIAGNOSIS — Z9882 Breast implant status: Secondary | ICD-10-CM

## 2012-05-25 NOTE — Telephone Encounter (Signed)
Will forward to KC as FYI 

## 2012-06-07 ENCOUNTER — Ambulatory Visit (HOSPITAL_COMMUNITY): Payer: Medicare Other | Attending: Pulmonary Disease

## 2012-07-21 ENCOUNTER — Ambulatory Visit (HOSPITAL_COMMUNITY): Payer: Medicare Other

## 2012-09-01 ENCOUNTER — Inpatient Hospital Stay: Admission: RE | Admit: 2012-09-01 | Payer: Medicare Other | Source: Ambulatory Visit

## 2012-09-14 ENCOUNTER — Other Ambulatory Visit: Payer: Medicare Other

## 2012-09-17 ENCOUNTER — Other Ambulatory Visit: Payer: Medicare Other

## 2012-09-22 ENCOUNTER — Inpatient Hospital Stay: Admission: RE | Admit: 2012-09-22 | Payer: Medicare Other | Source: Ambulatory Visit

## 2012-09-28 ENCOUNTER — Inpatient Hospital Stay: Admission: RE | Admit: 2012-09-28 | Payer: Medicare Other | Source: Ambulatory Visit

## 2012-09-29 ENCOUNTER — Ambulatory Visit (INDEPENDENT_AMBULATORY_CARE_PROVIDER_SITE_OTHER)
Admission: RE | Admit: 2012-09-29 | Discharge: 2012-09-29 | Disposition: A | Discharge status: Home / Self Care | Payer: Medicare Other | Source: Ambulatory Visit | Attending: Pulmonary Disease | Admitting: Pulmonary Disease

## 2012-09-29 DIAGNOSIS — R911 Solitary pulmonary nodule: Secondary | ICD-10-CM

## 2012-09-30 ENCOUNTER — Telehealth: Payer: Self-pay | Admitting: Pulmonary Disease

## 2012-09-30 NOTE — Telephone Encounter (Signed)
I spoke with pt and she is requesting her CT results from 09/29/12. Pt aware KC is not in this afternoon. Please advise KC thanks

## 2012-10-01 ENCOUNTER — Other Ambulatory Visit: Payer: Self-pay | Admitting: Pulmonary Disease

## 2012-10-01 DIAGNOSIS — R911 Solitary pulmonary nodule: Secondary | ICD-10-CM

## 2012-10-01 NOTE — Telephone Encounter (Signed)
Spoke with patient, patient very upset that she has not heard about her CT results as of today. I explained to the patient that CT results are not always 24 hour turn around time and that it may take a few days for the Doctor to review these results and [place] get back to her. Patient was very angry and upset and was yelling on the phone stating that "she is a patient too and expects to be treated the same as those we have been seeing all day." I explained to her that as soon as Dr Shelle Iron receives and reviews these results either Dr Shelle Iron or myself would call her right away. She did not like this answer and began yelling again stating that she wanted the results today, that she had been told on 09/29/12 when she had the CT that [facility] would call her the very next day, she called on yesterday 09/30/12 and KC was out of the office and they reassured her that someone would try and call her back today in regards to the results. Patient was told multiple times that as soon as these were reviewed that she would get a phone call, she then continued yelling and then hung up the phone.   KC aware of this situation and heard me speaking to patient on the phone. Message to be sent to [facility] as FYI that patient is calling and would like her CT results ASAP.   Please advise ASAP, thanks,

## 2012-10-01 NOTE — Telephone Encounter (Signed)
Discussed with pt at great length. Will do f/u scan in 6mos.  Orders sent to [facility].

## 2012-10-13 ENCOUNTER — Telehealth: Payer: Self-pay | Admitting: Pulmonary Disease

## 2012-10-13 NOTE — Telephone Encounter (Signed)
Spoke with patient-she is aware that CY is out of the office until Thursday morning and I will need to ask for his approval to do this.

## 2012-10-13 NOTE — Telephone Encounter (Signed)
I discussed w/ Dr Shelle Iron- ok to see her for second opinion visit

## 2012-10-14 NOTE — Telephone Encounter (Signed)
Pt returned call.  [name] ° °

## 2012-10-14 NOTE — Telephone Encounter (Signed)
LMTCB x 1 

## 2012-10-15 NOTE — Telephone Encounter (Signed)
Pt has been scheduled to see CY on 11/16/12 doe 2nd opinion. Pt to arrive at 2:45 for a 3pm appt.

## 2012-11-16 ENCOUNTER — Institutional Professional Consult (permissible substitution): Payer: Self-pay | Admitting: Internal Medicine

## 2012-12-15 ENCOUNTER — Encounter (HOSPITAL_COMMUNITY): Payer: Self-pay | Admitting: *Deleted

## 2012-12-15 ENCOUNTER — Emergency Department (HOSPITAL_COMMUNITY)
Admission: EM | Admit: 2012-12-15 | Discharge: 2012-12-15 | Disposition: A | Discharge status: Home / Self Care | Payer: Medicare Other | Attending: Emergency Medicine | Admitting: Emergency Medicine

## 2012-12-15 DIAGNOSIS — E7800 Pure hypercholesterolemia, unspecified: Secondary | ICD-10-CM | POA: Insufficient documentation

## 2012-12-15 DIAGNOSIS — G8929 Other chronic pain: Secondary | ICD-10-CM | POA: Insufficient documentation

## 2012-12-15 DIAGNOSIS — Z79899 Other long term (current) drug therapy: Secondary | ICD-10-CM | POA: Insufficient documentation

## 2012-12-15 DIAGNOSIS — F411 Generalized anxiety disorder: Secondary | ICD-10-CM | POA: Insufficient documentation

## 2012-12-15 DIAGNOSIS — F419 Anxiety disorder, unspecified: Secondary | ICD-10-CM

## 2012-12-15 DIAGNOSIS — Z87891 Personal history of nicotine dependence: Secondary | ICD-10-CM | POA: Insufficient documentation

## 2012-12-15 DIAGNOSIS — M549 Dorsalgia, unspecified: Secondary | ICD-10-CM | POA: Insufficient documentation

## 2012-12-15 DIAGNOSIS — Z8739 Personal history of other diseases of the musculoskeletal system and connective tissue: Secondary | ICD-10-CM | POA: Insufficient documentation

## 2012-12-15 DIAGNOSIS — Z7982 Long term (current) use of aspirin: Secondary | ICD-10-CM | POA: Insufficient documentation

## 2012-12-15 DIAGNOSIS — F3289 Other specified depressive episodes: Secondary | ICD-10-CM | POA: Insufficient documentation

## 2012-12-15 DIAGNOSIS — Z8679 Personal history of other diseases of the circulatory system: Secondary | ICD-10-CM | POA: Insufficient documentation

## 2012-12-15 DIAGNOSIS — Z8719 Personal history of other diseases of the digestive system: Secondary | ICD-10-CM | POA: Insufficient documentation

## 2012-12-15 DIAGNOSIS — J45909 Unspecified asthma, uncomplicated: Secondary | ICD-10-CM | POA: Insufficient documentation

## 2012-12-15 DIAGNOSIS — F329 Major depressive disorder, single episode, unspecified: Secondary | ICD-10-CM | POA: Insufficient documentation

## 2012-12-15 NOTE — ED Provider Notes (Signed)
History     CSN: [id number]  Arrival date & time 12/15/12  1854   First MD Initiated Contact with Patient 12/15/12 1924      Chief Complaint  Patient presents with  . Shoulder Pain  . Back Pain  . Anxiety    (Consider location/radiation/quality/duration/timing/severity/associated sxs/prior treatment) Patient is a 62 y.o. female presenting with shoulder pain, back pain, and anxiety.  Shoulder Pain  Back Pain Anxiety   Pt with history of chronic pain and anxiety reports she has been unable to fill several of her medications due to some issue at the pharmacy. She has been out of medications for 5 days, presents to the ED complaining of shoulder pain and feeling anxious. She denies any acute complaints.   Past Medical History  Diagnosis Date  . Anxiety   . Depression   . High cholesterol   . Chronic pain   . Osteoporosis   . Hypertension     resolved whn she stopped drinking alcohol  . Asthma   . GERD (gastroesophageal reflux disease)   . Constipation   . Osteopenia   . Mitral prolapse     dx as a child    Past Surgical History  Procedure Laterality Date  . Right femur replacement    . Rotator cuff repair      right  . Neck surgery      plates and screws  . Back surgery    . Abdominal hysterectomy    . Wrist surgery      tendon repair from a dog bite- left  . Orif wrist fracture  1986    right  . Left hip replacement  02-27-12    just screw - no replacement  . Shoulder hemi-arthroplasty  02/27/2012    Procedure: SHOULDER HEMI-ARTHROPLASTY;  Surgeon: Kerrin Champagne, MD;  Location: [facility] OR;  Service: Orthopedics;  Laterality: Right;  Right shoulder hemiarthroplasty, repair right rotator cuff  . Hardware removal  02/27/2012    Procedure: HARDWARE REMOVAL;  Surgeon: Kerrin Champagne, MD;  Location: [facility] OR;  Service: Orthopedics;;  exploration and removal of portion of screw at T-!    No family history on file.  History  Substance Use Topics  . Smoking status: Former  Smoker -- 0.75 packs/day for 50 years    Quit date: 11/14/2011  . Smokeless tobacco: Never Used  . Alcohol Use: Yes    OB History   Grav Para Term Preterm Abortions TAB SAB Ect Mult Living                  Review of Systems  Musculoskeletal: Positive for back pain.   All other systems reviewed and are negative except as noted in HPI.   Allergies  Penicillins; Propoxyphene-acetaminophen; and Vancomycin cross reactors  Home Medications   Current Outpatient Rx  Name  Route  Sig  Dispense  Refill  . albuterol (PROVENTIL HFA;VENTOLIN HFA) 108 (90 BASE) MCG/ACT inhaler   Inhalation   Inhale 2 puffs into the lungs every 4 (four) hours as needed. For shortness of breath         . amphetamine-dextroamphetamine (ADDERALL) 20 MG tablet      20 mg 3 (three) times daily.          Marland Kitchen aspirin 81 MG chewable tablet   Oral   Chew 81 mg by mouth daily.          . Calcium Carbonate-Vitamin D (CALCIUM + D) 600-200 MG-UNIT per tablet  Oral   Take 1 tablet by mouth 2 (two) times daily.          . citalopram (CELEXA) 40 MG tablet   Oral   Take 40 mg by mouth daily.          Marland Kitchen estrogen-methylTESTOSTERone (ESTRATEST) 1.25-2.5 MG per tablet   Oral   Take 1 tablet by mouth 3 (three) times a week.         . fish oil-omega-3 fatty acids 1000 MG capsule   Oral   Take 1 g by mouth 2 (two) times daily.         Marland Kitchen EXPIRED: HYDROmorphone (DILAUDID) 4 MG tablet   Oral   Take 1 tablet (4 mg total) by mouth every 4 (four) hours as needed.   40 tablet   0   . ibuprofen (ADVIL,MOTRIN) 200 MG tablet   Oral   Take 200 mg by mouth every 6 (six) hours as needed. For pain         . LORazepam (ATIVAN) 1 MG tablet   Oral   Take 1 mg by mouth every 8 (eight) hours.          . methocarbamol (ROBAXIN) 500 MG tablet               . morphine (MSIR) 15 MG tablet   Oral   Take 15 mg by mouth 4 (four) times daily.          . Multiple Vitamin (MULITIVITAMIN WITH MINERALS)  TABS   Oral   Take 1 tablet by mouth daily.         . nortriptyline (PAMELOR) 50 MG capsule   Oral   Take 50 mg by mouth daily.         . QUEtiapine (SEROQUEL) 25 MG tablet   Oral   Take 25 mg by mouth at bedtime.          . Sennosides (EX-LAX PO)   Oral   Take 6 tablets by mouth daily as needed. For constipation         . SF 5000 PLUS 1.1 % CREA dental cream               . simvastatin (ZOCOR) 20 MG tablet   Oral   Take 20 mg by mouth at bedtime.          Marland Kitchen EXPIRED: temazepam (RESTORIL) 30 MG capsule   Oral   Take 1 capsule (30 mg total) by mouth at bedtime.   30 capsule   0   . traZODone (DESYREL) 100 MG tablet   Oral   Take 500 mg by mouth at bedtime. As prescribed by psych           BP 174/80  Pulse 84  Temp(Src) 99.1 F (37.3 C) (Oral)  Resp 18  SpO2 96%  Physical Exam  Nursing note and vitals reviewed. Constitutional: She is oriented to person, place, and time. She appears well-developed and well-nourished.  HENT:  Head: Normocephalic and atraumatic.  Eyes: EOM are normal. Pupils are equal, round, and reactive to light.  Neck: Normal range of motion. Neck supple.  Cardiovascular: Normal rate, normal heart sounds and intact distal pulses.   Pulmonary/Chest: Effort normal and breath sounds normal.  Abdominal: Bowel sounds are normal. She exhibits no distension. There is no tenderness.  Musculoskeletal: Normal range of motion. She exhibits tenderness (R shoulder). She exhibits no edema.  Neurological: She is alert and oriented to person, place, and  time. She has normal strength. No cranial nerve deficit or sensory deficit.  Skin: Skin is warm and dry. No rash noted.  Psychiatric:  Anxious, argumentative    ED Course  Procedures (including critical care time)  Labs Reviewed - No data to display No results found.   1. Chronic pain   2. Anxiety       MDM  Pt is very anxious. I have offered her IM medications in the ED for symptom  control, but she states she has no one who can drive her home. Also I reviewed the [place] Controlled Substance database. She had lorazepam and florazepam filled 4-5 days ago and her Morphine about 28 days ago. I attempted to show this information to the patient so she would understand why her pharmacist may have been unable to fill her medications and she began very angry, states she 'doesn't need this shit' and tried to slam the door on me. I offered again to treat her symptoms in the ED but she is adamant that no one can come take her home. She says she will walk home.         Comfort Iversen B. Bernette Mayers, MD 12/15/12 [phone]
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
## 2012-12-15 NOTE — ED Notes (Signed)
Pt requested that [name] be called but this resource is not needed. Spoke with social work and will get pt a Electronics engineer for a ride home.

## 2012-12-15 NOTE — ED Notes (Signed)
Pt for discharge but pt walked out of the unit with anger without signing discharge and refused to take discharge papers.

## 2012-12-15 NOTE — ED Notes (Signed)
Pt walked out of the unit with anger and refused to sign the discharge .

## 2012-12-21 ENCOUNTER — Institutional Professional Consult (permissible substitution): Payer: Self-pay | Admitting: Internal Medicine

## 2012-12-23 ENCOUNTER — Telehealth: Payer: Self-pay | Admitting: Internal Medicine

## 2012-12-23 NOTE — Telephone Encounter (Signed)
Duplicate message will close this one

## 2012-12-23 NOTE — Telephone Encounter (Signed)
lmtcb x1 

## 2012-12-23 NOTE — Telephone Encounter (Signed)
Pt also wants to know about cxr.from a duplicate message.

## 2012-12-23 NOTE — Telephone Encounter (Signed)
LMTCB

## 2012-12-24 ENCOUNTER — Ambulatory Visit: Payer: Self-pay | Admitting: Internal Medicine

## 2012-12-24 NOTE — Telephone Encounter (Signed)
LMTCBX3 on number listed in message. I tried all numbers in pt chart and they are either disconnected or the person that answered has never heard of the pt. Carron Curie, CMA

## 2012-12-27 NOTE — Telephone Encounter (Signed)
LMOMTCB x 1 

## 2012-12-28 ENCOUNTER — Telehealth: Payer: Self-pay | Admitting: Internal Medicine

## 2012-12-28 NOTE — Telephone Encounter (Signed)
I spoke with pt and she is requesting to be worked in sooner with Dr. Maple Hudson for second opinion. She has a CT scheduled for June but wants to be seen sooner. Please advise if pt can be worked in. thanks

## 2012-12-28 NOTE — Telephone Encounter (Signed)
I think all we can offer is a routinely available slot.[name]

## 2012-12-28 NOTE — Telephone Encounter (Signed)
lmtcb x4 will sign off message per triage protocol. Advised tcb if anything further was needed

## 2012-12-28 NOTE — Telephone Encounter (Signed)
Pt called back again re: same. [name]  °

## 2012-12-28 NOTE — Telephone Encounter (Signed)
Looking back in chart patient began asking for 2nd opinion back in January 2014; since then has had to consult appointments scheduled with CY 11-16-12 and 12-23-12, both of which she cancelled; will forward to CY to advise if he would like to get patient in any sooner than the June next consult slot available.

## 2012-12-29 NOTE — Telephone Encounter (Signed)
LMTCB

## 2012-12-30 ENCOUNTER — Telehealth: Payer: Self-pay | Admitting: Pulmonary Disease

## 2012-12-30 NOTE — Telephone Encounter (Signed)
lmomtcb  

## 2012-12-30 NOTE — Telephone Encounter (Signed)
lmtcb x2 

## 2013-01-03 NOTE — Telephone Encounter (Signed)
Spoke with patient, patient is scheduled to see Dr. Shelle Iron 01/12/13 and states she has been trying to get in our office since Jan 2014. I advised patient it appears we had attempted to get in touch with her but was unable to. Patient sounds very anxious about having CT done as soon as possible and does not want to wait until June for an office visit or scan. Patient states she is worried about "this spot in her lungs" (Wants scan done prior to appt here in office so that it can be looked at when she comes in). Patient would like to see Dr. Maple Hudson however there are no availbale openings until June. Could patient be worked in in an sooner spot? Dr. Maple Hudson, Please advise, thank you!

## 2013-01-03 NOTE — Telephone Encounter (Signed)
lmomtcb  

## 2013-01-03 NOTE — Telephone Encounter (Signed)
Duplicate phone msg taken on 12/30/12. Per phone msg patient is scheduled to see [facility] on 01/12/13  When  patient returns call: Does she want to keep appt with [facility] or come in for second opinion with Dr. Maple Hudson Per below msg from Dr. Maple Hudson: pt will only be able to have next available consult slot.

## 2013-01-03 NOTE — Telephone Encounter (Signed)
Duplicate phone msg from 12/28/12. Pls see phone msg form 12/28/12 for additional infomation

## 2013-01-03 NOTE — Telephone Encounter (Signed)
Per CY-we will only offer the next routine consult slot available. Thanks.

## 2013-01-04 ENCOUNTER — Encounter: Payer: Self-pay | Admitting: Obstetrics

## 2013-01-04 NOTE — Telephone Encounter (Signed)
Spoke with patient, patient will keep appt with Dr. Shelle Iron. Patient wants to have CT chest done prior to appt. Dr. Shelle Iron are you ok with ordering this? Please advise, thank you

## 2013-01-04 NOTE — Telephone Encounter (Signed)
Make sure she understands the radiologist who read her study in Jan 2014 recommended a followup in ONE YEAR.  I agreed to do a followup scan in 6 MOS.  That will be in July, and not before.  She actually does not need to be seen, and no reason for her to come in for a followup since her ct won't be done again until July.

## 2013-01-04 NOTE — Telephone Encounter (Signed)
I spoke with the pt and she is VERY confused. I spoke with the pt at [facility] and explained that she does not need to come in for OV on 01-12-13 unless she is having problems. Pt denies any resp problems. She is just anxious to have CT done to f/u on spot on her lung. I advised her that insurance may not cover a CT scan now since it is so soon after her las scan. Pt then proceeds to deny that she had a CT scan in Jan 2014. I advised her that we have the results in her chart and that a nurse called her and reviewed the results. The pt then states that she does not have a good memory due to the morphine she takes daily. After lengthy discussion the pt seemed to understand that she does not need to have follow-up on 01-12-13 and that we have ordered CT to be done in July and she is too follow-up AFTER her CT in July. Pt stated understanding.  Appt has been cancelled. Carron Curie, CMA

## 2013-01-12 ENCOUNTER — Ambulatory Visit: Payer: Self-pay | Admitting: Pulmonary Disease

## 2013-01-12 ENCOUNTER — Telehealth: Payer: Self-pay | Admitting: Pulmonary Disease

## 2013-01-12 NOTE — Telephone Encounter (Signed)
Error.[name] ° °

## 2013-01-14 ENCOUNTER — Ambulatory Visit: Payer: Self-pay | Admitting: Pulmonary Disease

## 2013-01-14 ENCOUNTER — Telehealth: Payer: Self-pay | Admitting: Pulmonary Disease

## 2013-01-14 NOTE — Telephone Encounter (Signed)
KC had an opening on 5/9/124 at 11:15. Pt was scheduled to come in at that time

## 2013-01-21 ENCOUNTER — Telehealth: Payer: Self-pay | Admitting: Pulmonary Disease

## 2013-01-21 ENCOUNTER — Ambulatory Visit: Payer: Self-pay | Admitting: Pulmonary Disease

## 2013-01-21 NOTE — Telephone Encounter (Signed)
I called [facility] and spoke with representative there and asked them to look at pt schedule to see if she had an appt for transportation today and she did not show any appt set for today for the pt. She states they schedule 7 days out and they cannot take any requests after 5pm or for the same day. I LMTCBx1 to speak with the pt. Pt saw Dr. Shelle Iron 05-2012 and is to have a CT scan in July to f/u on nodule. Per previous phone note the pt does NOT need to be seen prior to the scan. She was not seen for any other pulmonary issues, just nodule. Will await call back. Carron Curie, CMA
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
## 2013-01-24 NOTE — Telephone Encounter (Signed)
LMOM TCB x1 *NOTE: the name on the voice mail stated "Lennox Laity"

## 2013-01-25 NOTE — Telephone Encounter (Signed)
ATC pt x1, received automated message stating that "The person you are trying to call is not available right now.  Please try your call again later."

## 2013-01-26 NOTE — Telephone Encounter (Signed)
Called # provided above - went directly to msg stating "The person you are trying to reach is unavailable right now.  Please try your call again later."  I called pt's home # - [phone] - lmomtcb I atc pt's cell # - [phone] - this is the [employer] reservation line There is another home # listed in her chart as [phone], I ATC this # but received msg stating the # has been disconnected or is no longer in service.

## 2013-01-27 NOTE — Telephone Encounter (Signed)
I ATC number given in message and received same message as yesterday. I called pt home # and was able to LMTCBx3. If no return call per protocol message will be closed.Carron Curie, CMA

## 2013-02-16 ENCOUNTER — Other Ambulatory Visit: Payer: Self-pay

## 2013-02-17 ENCOUNTER — Other Ambulatory Visit: Payer: Self-pay

## 2013-02-21 ENCOUNTER — Other Ambulatory Visit: Payer: Self-pay

## 2013-02-22 ENCOUNTER — Ambulatory Visit (INDEPENDENT_AMBULATORY_CARE_PROVIDER_SITE_OTHER)
Admission: RE | Admit: 2013-02-22 | Discharge: 2013-02-22 | Disposition: A | Discharge status: Home / Self Care | Payer: Medicare Other | Source: Ambulatory Visit | Attending: Pulmonary Disease | Admitting: Pulmonary Disease

## 2013-02-22 DIAGNOSIS — R911 Solitary pulmonary nodule: Secondary | ICD-10-CM

## 2013-02-23 ENCOUNTER — Emergency Department (HOSPITAL_COMMUNITY)
Admission: EM | Admit: 2013-02-23 | Discharge: 2013-02-23 | Disposition: A | Discharge status: Other Acute Inpatient Hospital | Payer: Medicare Other | Attending: Emergency Medicine | Admitting: Emergency Medicine

## 2013-02-23 ENCOUNTER — Encounter (HOSPITAL_COMMUNITY): Payer: Self-pay | Admitting: Emergency Medicine

## 2013-02-23 DIAGNOSIS — K5900 Constipation, unspecified: Secondary | ICD-10-CM | POA: Insufficient documentation

## 2013-02-23 DIAGNOSIS — R45851 Suicidal ideations: Secondary | ICD-10-CM | POA: Insufficient documentation

## 2013-02-23 DIAGNOSIS — M899 Disorder of bone, unspecified: Secondary | ICD-10-CM | POA: Insufficient documentation

## 2013-02-23 DIAGNOSIS — F411 Generalized anxiety disorder: Secondary | ICD-10-CM | POA: Insufficient documentation

## 2013-02-23 DIAGNOSIS — G8929 Other chronic pain: Secondary | ICD-10-CM | POA: Insufficient documentation

## 2013-02-23 DIAGNOSIS — Z8679 Personal history of other diseases of the circulatory system: Secondary | ICD-10-CM | POA: Insufficient documentation

## 2013-02-23 DIAGNOSIS — M810 Age-related osteoporosis without current pathological fracture: Secondary | ICD-10-CM | POA: Insufficient documentation

## 2013-02-23 DIAGNOSIS — F29 Unspecified psychosis not due to a substance or known physiological condition: Secondary | ICD-10-CM

## 2013-02-23 DIAGNOSIS — F329 Major depressive disorder, single episode, unspecified: Secondary | ICD-10-CM | POA: Insufficient documentation

## 2013-02-23 DIAGNOSIS — Z87891 Personal history of nicotine dependence: Secondary | ICD-10-CM | POA: Insufficient documentation

## 2013-02-23 DIAGNOSIS — F3289 Other specified depressive episodes: Secondary | ICD-10-CM | POA: Insufficient documentation

## 2013-02-23 DIAGNOSIS — E7800 Pure hypercholesterolemia, unspecified: Secondary | ICD-10-CM | POA: Insufficient documentation

## 2013-02-23 DIAGNOSIS — Z880 Allergy status to penicillin: Secondary | ICD-10-CM | POA: Insufficient documentation

## 2013-02-23 DIAGNOSIS — R7401 Elevation of levels of liver transaminase levels: Secondary | ICD-10-CM | POA: Insufficient documentation

## 2013-02-23 DIAGNOSIS — J45909 Unspecified asthma, uncomplicated: Secondary | ICD-10-CM | POA: Insufficient documentation

## 2013-02-23 DIAGNOSIS — M949 Disorder of cartilage, unspecified: Secondary | ICD-10-CM | POA: Insufficient documentation

## 2013-02-23 DIAGNOSIS — Z8719 Personal history of other diseases of the digestive system: Secondary | ICD-10-CM | POA: Insufficient documentation

## 2013-02-23 DIAGNOSIS — Z7982 Long term (current) use of aspirin: Secondary | ICD-10-CM | POA: Insufficient documentation

## 2013-02-23 DIAGNOSIS — Z79899 Other long term (current) drug therapy: Secondary | ICD-10-CM | POA: Insufficient documentation

## 2013-02-23 DIAGNOSIS — R7402 Elevation of levels of lactic acid dehydrogenase (LDH): Secondary | ICD-10-CM | POA: Insufficient documentation

## 2013-02-23 LAB — COMPREHENSIVE METABOLIC PANEL
ALT: 85 U/L — ABNORMAL HIGH (ref 0–35)
Alkaline Phosphatase: 281 U/L — ABNORMAL HIGH (ref 39–117)
Chloride: 103 mEq/L (ref 96–112)
GFR calc Af Amer: >90 mL/min (ref >90)
Glucose, Bld: 100 mg/dL — ABNORMAL HIGH (ref 70–99)
Potassium: 3.7 mEq/L (ref 3.5–5.1)
Sodium: 140 mEq/L (ref 135–145)
Total Bilirubin: 0.2 mg/dL — ABNORMAL LOW (ref 0.3–1.2)
Total Protein: 6.8 g/dL (ref 6.0–8.3)

## 2013-02-23 LAB — CBC
HCT: 36.5 % (ref 36.0–46.0)
Hemoglobin: 12.1 g/dL (ref 12.0–15.0)
MCHC: 33.2 g/dL (ref 30.0–36.0)
RBC: 4.37 MIL/uL (ref 3.87–5.11)
WBC: 6.3 10*3/uL (ref 4.0–10.5)

## 2013-02-23 LAB — ACETAMINOPHEN LEVEL: Acetaminophen (Tylenol), Serum: <15 ug/mL (ref 10–30)

## 2013-02-23 LAB — RAPID URINE DRUG SCREEN, HOSP PERFORMED
Barbiturates: NOT DETECTED
Benzodiazepines: NOT DETECTED
Cocaine: NOT DETECTED
Tetrahydrocannabinol: NOT DETECTED

## 2013-02-23 MED ORDER — EST ESTROGENS-METHYLTEST 1.25-2.5 MG PO TABS
1.0000 | ORAL_TABLET | ORAL | Status: DC
Start: 2013-02-23 — End: 2013-02-23

## 2013-02-23 MED ORDER — AMPHETAMINE-DEXTROAMPHETAMINE 20 MG PO TABS: 20.0000 mg | ORAL_TABLET | Freq: Three times a day (TID) | ORAL | Status: DC

## 2013-02-23 MED ORDER — RISPERIDONE 2 MG PO TABS: 2.0000 mg | ORAL_TABLET | Freq: Every day | ORAL | Status: DC

## 2013-02-23 MED ORDER — METHOCARBAMOL 500 MG PO TABS: 500.0000 mg | ORAL_TABLET | Freq: Four times a day (QID) | ORAL | Status: DC | PRN

## 2013-02-23 MED ORDER — ASPIRIN 81 MG PO CHEW: 81.0000 mg | CHEWABLE_TABLET | Freq: Every day | ORAL | Status: DC

## 2013-02-23 MED ORDER — QUETIAPINE FUMARATE 25 MG PO TABS: 25.0000 mg | ORAL_TABLET | Freq: Every day | ORAL | Status: DC

## 2013-02-23 MED ORDER — NORTRIPTYLINE HCL 25 MG PO CAPS
50.0000 mg | ORAL_CAPSULE | Freq: Two times a day (BID) | ORAL | Status: DC
  Filled 2013-02-23: qty 2

## 2013-02-23 MED ORDER — CITALOPRAM HYDROBROMIDE 40 MG PO TABS: 40.0000 mg | ORAL_TABLET | Freq: Every day | ORAL | Status: DC

## 2013-02-23 MED ORDER — ALUM & MAG HYDROXIDE-SIMETH 200-200-20 MG/5ML PO SUSP: 30.0000 mL | ORAL | Status: DC | PRN

## 2013-02-23 MED ORDER — TRAZODONE HCL 100 MG PO TABS: 500.0000 mg | ORAL_TABLET | Freq: Every day | ORAL | Status: DC

## 2013-02-23 MED ORDER — NORTRIPTYLINE HCL 25 MG PO CAPS: 50.0000 mg | ORAL_CAPSULE | Freq: Two times a day (BID) | ORAL | Status: DC

## 2013-02-23 MED ORDER — ONDANSETRON HCL 4 MG PO TABS: 4.0000 mg | ORAL_TABLET | Freq: Three times a day (TID) | ORAL | Status: DC | PRN

## 2013-02-23 MED ORDER — SIMVASTATIN 20 MG PO TABS
20.0000 mg | ORAL_TABLET | Freq: Every day | ORAL | Status: DC
  Filled 2013-02-23: qty 1

## 2013-02-23 MED ORDER — LORAZEPAM 1 MG PO TABS: 1.0000 mg | ORAL_TABLET | Freq: Three times a day (TID) | ORAL | Status: DC | PRN

## 2013-02-23 MED ORDER — ZOLPIDEM TARTRATE 5 MG PO TABS: 5.0000 mg | ORAL_TABLET | Freq: Every evening | ORAL | Status: DC | PRN

## 2013-02-23 MED ORDER — EST ESTROGENS-METHYLTEST 1.25-2.5 MG PO TABS: 1.0000 | ORAL_TABLET | ORAL | Status: DC

## 2013-02-23 MED ORDER — ALBUTEROL SULFATE HFA 108 (90 BASE) MCG/ACT IN AERS: 2.0000 | INHALATION_SPRAY | RESPIRATORY_TRACT | Status: DC | PRN

## 2013-02-23 MED ORDER — NICOTINE 21 MG/24HR TD PT24: 21.0000 mg | MEDICATED_PATCH | Freq: Every day | TRANSDERMAL | Status: DC
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
## 2013-02-23 NOTE — ED Provider Notes (Signed)
History    This chart was scribed for non-physician practitioner Fayrene Helper PA-C working with Toy Baker, MD by Smitty Pluck, ED scribe. This patient was seen in room WTR3/WLPT3 and the patient's care was started at 7:16 PM.   CSN: [id number]  Arrival date & time 02/23/13  1858    Chief Complaint  Patient presents with  . Medical Clearance     The history is provided by the patient and medical records. No language interpreter was used.   HPI Comments: [name] is a 62 y.o. female with hx of anxiety, depression, who presents to the Emergency Department brought from [facility] due to pt having IVC papers. Her IVC papers state that "pt has psychosis and thinks that people are coming into her house although no one is actually doing this" and pt hears her deceased mother talking to her. Per IVC pt states she wants to die. Pt denies HI, fever, chills, nausea, vomiting, diarrhea, weakness, cough, SOB and any other pain. She states she is currently taking all of her medications. She reports today is October 08 1905.    Pt reports that she gets medications prescribed by psychiatrist  Dr. Otelia Sergeant   Past Medical History  Diagnosis Date  . Anxiety   . Depression   . High cholesterol   . Chronic pain   . Osteoporosis   . Hypertension     resolved whn she stopped drinking alcohol  . Asthma   . GERD (gastroesophageal reflux disease)   . Constipation   . Osteopenia   . Mitral prolapse     dx as a child    Past Surgical History  Procedure Laterality Date  . Right femur replacement    . Rotator cuff repair      right  . Neck surgery      plates and screws  . Back surgery    . Abdominal hysterectomy    . Wrist surgery      tendon repair from a dog bite- left  . Orif wrist fracture  1986    right  . Left hip replacement  02-27-12    just screw - no replacement  . Shoulder hemi-arthroplasty  02/27/2012    Procedure: SHOULDER HEMI-ARTHROPLASTY;  Surgeon: Kerrin Champagne, MD;   Location: [facility] OR;  Service: Orthopedics;  Laterality: Right;  Right shoulder hemiarthroplasty, repair right rotator cuff  . Hardware removal  02/27/2012    Procedure: HARDWARE REMOVAL;  Surgeon: Kerrin Champagne, MD;  Location: [facility] OR;  Service: Orthopedics;;  exploration and removal of portion of screw at T-!    No family history on file.  History  Substance Use Topics  . Smoking status: Former Smoker -- 0.75 packs/day for 50 years    Quit date: 11/14/2011  . Smokeless tobacco: Never Used  . Alcohol Use: Yes    OB History   Grav Para Term Preterm Abortions TAB SAB Ect Mult Living                  Review of Systems  Constitutional: Negative for fever and chills.  Respiratory: Negative for shortness of breath.   Gastrointestinal: Negative for nausea and vomiting.  Neurological: Negative for weakness.  Psychiatric/Behavioral: Positive for suicidal ideas and hallucinations.  All other systems reviewed and are negative.    Allergies  Penicillins; Propoxyphene-acetaminophen; and Vancomycin cross reactors  Home Medications   Current Outpatient Rx  Name  Route  Sig  Dispense  Refill  . albuterol (  PROVENTIL HFA;VENTOLIN HFA) 108 (90 BASE) MCG/ACT inhaler   Inhalation   Inhale 2 puffs into the lungs every 4 (four) hours as needed. For shortness of breath         . amphetamine-dextroamphetamine (ADDERALL) 20 MG tablet      20 mg 3 (three) times daily.          Marland Kitchen aspirin 81 MG chewable tablet   Oral   Chew 81 mg by mouth daily.          . Calcium Carbonate-Vitamin D (CALCIUM + D) 600-200 MG-UNIT per tablet   Oral   Take 1 tablet by mouth 2 (two) times daily.          . citalopram (CELEXA) 40 MG tablet   Oral   Take 40 mg by mouth daily.          Marland Kitchen estrogen-methylTESTOSTERone (ESTRATEST) 1.25-2.5 MG per tablet   Oral   Take 1 tablet by mouth 3 (three) times a week.         . fish oil-omega-3 fatty acids 1000 MG capsule   Oral   Take 1 g by mouth 2 (two) times  daily.         Marland Kitchen EXPIRED: HYDROmorphone (DILAUDID) 4 MG tablet   Oral   Take 1 tablet (4 mg total) by mouth every 4 (four) hours as needed.   40 tablet   0   . ibuprofen (ADVIL,MOTRIN) 200 MG tablet   Oral   Take 200 mg by mouth every 6 (six) hours as needed. For pain         . LORazepam (ATIVAN) 1 MG tablet   Oral   Take 1 mg by mouth every 8 (eight) hours.          . methocarbamol (ROBAXIN) 500 MG tablet               . morphine (MSIR) 15 MG tablet   Oral   Take 15 mg by mouth 4 (four) times daily.          . Multiple Vitamin (MULITIVITAMIN WITH MINERALS) TABS   Oral   Take 1 tablet by mouth daily.         . nortriptyline (PAMELOR) 50 MG capsule   Oral   Take 50 mg by mouth daily.         . QUEtiapine (SEROQUEL) 25 MG tablet   Oral   Take 25 mg by mouth at bedtime.          . Sennosides (EX-LAX PO)   Oral   Take 6 tablets by mouth daily as needed. For constipation         . SF 5000 PLUS 1.1 % CREA dental cream               . simvastatin (ZOCOR) 20 MG tablet   Oral   Take 20 mg by mouth at bedtime.          Marland Kitchen EXPIRED: temazepam (RESTORIL) 30 MG capsule   Oral   Take 1 capsule (30 mg total) by mouth at bedtime.   30 capsule   0   . traZODone (DESYREL) 100 MG tablet   Oral   Take 500 mg by mouth at bedtime. As prescribed by psych           BP 146/75  Pulse 100  Temp(Src) 98.7 F (37.1 C) (Oral)  Resp 20  SpO2 95%  Physical Exam  Nursing note and vitals reviewed.  Constitutional: She is oriented to person, place, and time. She appears well-developed and well-nourished. No distress.  HENT:  Head: Normocephalic and atraumatic.  Eyes: Conjunctivae and EOM are normal. Pupils are equal, round, and reactive to light.  Neck: Normal range of motion. Neck supple.  Cardiovascular: Normal rate, regular rhythm, normal heart sounds and intact distal pulses.   Pulmonary/Chest: Effort normal and breath sounds normal. No respiratory  distress. She has no wheezes. She has no rales.  Abdominal: Soft. She exhibits no distension. There is no tenderness. There is no rebound and no guarding.  Musculoskeletal: Normal range of motion.  Neurological: She is alert and oriented to person, place, and time. She has normal reflexes.  Skin: Skin is warm and dry. No rash noted.  Psychiatric: Her affect is inappropriate. Her speech is tangential. Thought content is delusional. Thought content is not paranoid. Cognition and memory are impaired. She expresses inappropriate judgment. She expresses no homicidal and no suicidal ideation. She is inattentive.    ED Course  Procedures (including critical care time)   Date: 02/23/2013  Rate: 88  Rhythm: normal sinus rhythm  QRS Axis: normal  Intervals: normal  ST/T Wave abnormalities: normal  Conduction Disutrbances: none  Narrative Interpretation:   Old EKG Reviewed: no prior for comparison     DIAGNOSTIC STUDIES: Oxygen Saturation is 95% on room air, adequate by my interpretation.    COORDINATION OF CARE: 7:23 PM Discussed ED treatment with pt and pt agrees.   8:37 PM Pt with evidence of transaminitis with AST 167, ALT 85, and alk phos of 281.  Normal liver function 10 months ago.  ON exam pt's abdomen is nontender, no Murphy sign.  Pt doesn't appear to be having hepatic encephalopathy.  However she is taking Zocor, which may account to her transaminitis.  Care discussed with attending, who recommend pt to return to [facility] and for [facility] to monitor her labs.  Pt otherwise medically cleared.  Her vital sign is stable, doubt serotonin syndrome as she has no fever, no neuro deficits, and no other clinical finding suggestive of serotonin syndrome.    Labs Reviewed  COMPREHENSIVE METABOLIC PANEL - Abnormal; Notable for the following:    Glucose, Bld 100 (*)    Albumin 3.3 (*)    AST 167 (*)    ALT 85 (*)    Alkaline Phosphatase 281 (*)    Total Bilirubin 0.2 (*)    All other  components within normal limits  SALICYLATE LEVEL - Abnormal; Notable for the following:    Salicylate Lvl <2.0 (*)    All other components within normal limits  ACETAMINOPHEN LEVEL  CBC  ETHANOL  URINE RAPID DRUG SCREEN (HOSP PERFORMED)   Ct Chest Wo Contrast  02/22/2013   *RADIOLOGY REPORT*  Clinical Data: Follow-up of right lung nodules.  CT CHEST WITHOUT CONTRAST  Technique:  Multidetector CT imaging of the chest was performed following the standard protocol without IV contrast.  Comparison: Multiple priors, most recently chest CT 09/29/2012.  Findings:  Mediastinum: Heart size is normal. There is no significant pericardial fluid, thickening or pericardial calcification. No pathologically enlarged mediastinal or hilar lymph nodes. Please note that accurate exclusion of hilar adenopathy is limited on noncontrast CT scans.  Esophagus is unremarkable in appearance.  Lungs/Pleura: No change in the small 6 x 4 mm right lower lobe pulmonary nodule (image 33 of series 3).  No other suspicious appearing pulmonary nodules or masses are identified.  Several of the tiny nodules on  the prior examination have resolved, including the nodule described in the inferior aspect of the right upper lobe on the prior study. No acute consolidative airspace disease.  No pleural effusions.  Mild centrilobular paraseptal emphysema.  Mild diffuse bronchial wall thickening with some mild cylindrical bronchiectasis and architectural distortion in the medial segment of the right middle lobe which is unchanged, most compatible with chronic post infectious scarring.  Upper Abdomen: Unremarkable.  Musculoskeletal: Status post right shoulder arthroplasty. There are no aggressive appearing lytic or blastic lesions noted in the visualized portions of the skeleton.  Old compression fracture of T12 with approximately 30% loss of anterior vertebral body height is unchanged.  Postoperative changes of anterior discectomy and fusion in the  lower cervical spine extending to the level of T1 are incompletely visualized.  Bilateral subglandular breast implants are again noted, and appear heavily calcified with a marked internal heterogeneity and evidence of new extracapsular rupture in the lateral aspect of the left breast.  IMPRESSION: 1.  Unchanged 6 x 4 mm right lower lobe nodule which appears very similar to prior study from 02/28/2012.  This is favored to be benign, however, attention on an additional 1 year follow-up CT scan in June 2015 is recommended. 2.  Other previously noted small pulmonary nodules have resolved. 3.  Interval development of an extracapsular rupture of the patient's heavily calcified left-sided breast implant. 4.  Mild centrilobular paraseptal emphysema with mild diffuse bronchial wall thickening; imaging findings compatible with underlying COPD.  5.  Additional incidental findings, as above.   Original Report Authenticated By: Trudie Reed, M.D.     1. Psychosis   2. Transaminitis       MDM  BP 146/75  Pulse 100  Temp(Src) 98.7 F (37.1 C) (Oral)  Resp 20  SpO2 95%   I personally performed the services described in this documentation, which was scribed in my presence. The recorded information has been reviewed and is accurate.         Fayrene Helper, PA-C 02/23/13 2050

## 2013-02-23 NOTE — ED Notes (Signed)
Pt to be transported back to [place] facility via [facility] - spoke w/ RN at [facility] about pt's lab work and that pt has been medically cleared by our facility.

## 2013-02-24 NOTE — ED Provider Notes (Signed)
Medical screening examination/treatment/procedure(s) were performed by non-physician practitioner and as supervising physician I was immediately available for consultation/collaboration.  Toy Baker, MD 02/24/13 [phone]

## 2013-02-25 ENCOUNTER — Emergency Department (HOSPITAL_COMMUNITY)
Admission: EM | Admit: 2013-02-25 | Discharge: 2013-02-25 | Disposition: A | Discharge status: Other Acute Inpatient Hospital | Payer: Medicare Other | Attending: Emergency Medicine | Admitting: Emergency Medicine

## 2013-02-25 ENCOUNTER — Encounter (HOSPITAL_COMMUNITY): Payer: Self-pay | Admitting: Emergency Medicine

## 2013-02-25 DIAGNOSIS — Z862 Personal history of diseases of the blood and blood-forming organs and certain disorders involving the immune mechanism: Secondary | ICD-10-CM | POA: Insufficient documentation

## 2013-02-25 DIAGNOSIS — F32A Depression, unspecified: Secondary | ICD-10-CM

## 2013-02-25 DIAGNOSIS — Z880 Allergy status to penicillin: Secondary | ICD-10-CM | POA: Insufficient documentation

## 2013-02-25 DIAGNOSIS — F411 Generalized anxiety disorder: Secondary | ICD-10-CM | POA: Insufficient documentation

## 2013-02-25 DIAGNOSIS — Z87891 Personal history of nicotine dependence: Secondary | ICD-10-CM | POA: Insufficient documentation

## 2013-02-25 DIAGNOSIS — Z7982 Long term (current) use of aspirin: Secondary | ICD-10-CM | POA: Insufficient documentation

## 2013-02-25 DIAGNOSIS — F329 Major depressive disorder, single episode, unspecified: Secondary | ICD-10-CM | POA: Insufficient documentation

## 2013-02-25 DIAGNOSIS — Z79899 Other long term (current) drug therapy: Secondary | ICD-10-CM | POA: Insufficient documentation

## 2013-02-25 DIAGNOSIS — Z8639 Personal history of other endocrine, nutritional and metabolic disease: Secondary | ICD-10-CM | POA: Insufficient documentation

## 2013-02-25 DIAGNOSIS — I10 Essential (primary) hypertension: Secondary | ICD-10-CM | POA: Insufficient documentation

## 2013-02-25 DIAGNOSIS — F3289 Other specified depressive episodes: Secondary | ICD-10-CM | POA: Insufficient documentation

## 2013-02-25 DIAGNOSIS — Z8679 Personal history of other diseases of the circulatory system: Secondary | ICD-10-CM | POA: Insufficient documentation

## 2013-02-25 DIAGNOSIS — Z8739 Personal history of other diseases of the musculoskeletal system and connective tissue: Secondary | ICD-10-CM | POA: Insufficient documentation

## 2013-02-25 DIAGNOSIS — F419 Anxiety disorder, unspecified: Secondary | ICD-10-CM

## 2013-02-25 DIAGNOSIS — F22 Delusional disorders: Secondary | ICD-10-CM

## 2013-02-25 DIAGNOSIS — J45909 Unspecified asthma, uncomplicated: Secondary | ICD-10-CM | POA: Insufficient documentation

## 2013-02-25 DIAGNOSIS — Z8719 Personal history of other diseases of the digestive system: Secondary | ICD-10-CM | POA: Insufficient documentation

## 2013-02-25 LAB — COMPREHENSIVE METABOLIC PANEL WITH GFR
ALT: 56 U/L — ABNORMAL HIGH (ref 0–35)
AST: 43 U/L — ABNORMAL HIGH (ref 0–37)
Albumin: 3.7 g/dL (ref 3.5–5.2)
Alkaline Phosphatase: 260 U/L — ABNORMAL HIGH (ref 39–117)
BUN: 11 mg/dL (ref 6–23)
CO2: 24 meq/L (ref 19–32)
Calcium: 9.6 mg/dL (ref 8.4–10.5)
Chloride: 98 meq/L (ref 96–112)
Creatinine, Ser: 0.52 mg/dL (ref 0.50–1.10)
GFR calc Af Amer: >90 mL/min
GFR calc non Af Amer: >90 mL/min
Glucose, Bld: 114 mg/dL — ABNORMAL HIGH (ref 70–99)
Potassium: 3.4 meq/L — ABNORMAL LOW (ref 3.5–5.1)
Sodium: 135 meq/L (ref 135–145)
Total Bilirubin: 0.4 mg/dL (ref 0.3–1.2)
Total Protein: 7.5 g/dL (ref 6.0–8.3)

## 2013-02-25 LAB — CBC
HCT: 39.8 % (ref 36.0–46.0)
Hemoglobin: 13.5 g/dL (ref 12.0–15.0)
MCH: 28 pg (ref 26.0–34.0)
MCHC: 33.9 g/dL (ref 30.0–36.0)
MCV: 82.6 fL (ref 78.0–100.0)
Platelets: 369 K/uL (ref 150–400)
RBC: 4.82 MIL/uL (ref 3.87–5.11)
RDW: 14 % (ref 11.5–15.5)
WBC: 9.6 K/uL (ref 4.0–10.5)

## 2013-02-25 MED ORDER — POTASSIUM CHLORIDE CRYS ER 20 MEQ PO TBCR
40.0000 meq | EXTENDED_RELEASE_TABLET | Freq: Once | ORAL | Status: AC
  Administered 2013-02-25: 40 meq via ORAL
  Filled 2013-02-25: qty 2

## 2013-02-25 MED ORDER — ACETAMINOPHEN 325 MG PO TABS
650.0000 mg | ORAL_TABLET | ORAL | Status: DC | PRN
  Administered 2013-02-25: 650 mg via ORAL
  Filled 2013-02-25: qty 2

## 2013-02-25 MED ORDER — ZIPRASIDONE MESYLATE 20 MG IM SOLR
10.0000 mg | Freq: Once | INTRAMUSCULAR | Status: AC
  Administered 2013-02-25: 13:00:00 via INTRAMUSCULAR
  Filled 2013-02-25: qty 20

## 2013-02-25 MED ORDER — LORAZEPAM 1 MG PO TABS
1.0000 mg | ORAL_TABLET | Freq: Three times a day (TID) | ORAL | Status: DC | PRN
  Administered 2013-02-25: 1 mg via ORAL
  Filled 2013-02-25: qty 1

## 2013-02-25 MED ORDER — IBUPROFEN 200 MG PO TABS
600.0000 mg | ORAL_TABLET | Freq: Three times a day (TID) | ORAL | Status: DC | PRN
  Administered 2013-02-25: 600 mg via ORAL
  Filled 2013-02-25: qty 3

## 2013-02-25 NOTE — Progress Notes (Signed)
Per chart review, patient medically stable to return to [facility]. CSW spoke with nurse who confirmed patient is returning.   Catha Gosselin, LCSWA  [phone] .02/25/2013   1531pm

## 2013-02-25 NOTE — ED Provider Notes (Signed)
History     CSN: [id number]  Arrival date & time 02/25/13  1031   First MD Initiated Contact with Patient 02/25/13 1054      Chief Complaint  Patient presents with  . Medical Clearance    (Consider location/radiation/quality/duration/timing/severity/associated sxs/prior treatment) The history is provided by the patient. The history is limited by the condition of the patient.  pt w hx anxiety and depression, from [place], where pt was awaiting psych placement. Per report, pt w periods agitation, also refusing food/drink and meds at different periods. Pt went to [place] 2 days ago w ivc papers re paranoid psychosis. On arrival to ed pt appears to have paranoid thoughts, but is alert, and cooperative. Pt states she will take her meds as prescribed. Pt indicates she does not like how the care at [facility] is provided, feeling their handling is too rough, and that her needs arent being met. Pt difficult historian -  Level 5 caveat, mental illness.   Past Medical History  Diagnosis Date  . Anxiety   . Depression   . High cholesterol   . Chronic pain   . Osteoporosis   . Hypertension     resolved whn she stopped drinking alcohol  . Asthma   . GERD (gastroesophageal reflux disease)   . Constipation   . Osteopenia   . Mitral prolapse     dx as a child    Past Surgical History  Procedure Laterality Date  . Right femur replacement    . Rotator cuff repair      right  . Neck surgery      plates and screws  . Back surgery    . Abdominal hysterectomy    . Wrist surgery      tendon repair from a dog bite- left  . Orif wrist fracture  1986    right  . Left hip replacement  02-27-12    just screw - no replacement  . Shoulder hemi-arthroplasty  02/27/2012    Procedure: SHOULDER HEMI-ARTHROPLASTY;  Surgeon: Kerrin Champagne, MD;  Location: [facility] OR;  Service: Orthopedics;  Laterality: Right;  Right shoulder hemiarthroplasty, repair right rotator cuff  . Hardware removal  02/27/2012     Procedure: HARDWARE REMOVAL;  Surgeon: Kerrin Champagne, MD;  Location: [facility] OR;  Service: Orthopedics;;  exploration and removal of portion of screw at T-!    No family history on file.  History  Substance Use Topics  . Smoking status: Former Smoker -- 0.75 packs/day for 50 years    Quit date: 11/14/2011  . Smokeless tobacco: Never Used  . Alcohol Use: Yes    OB History   Grav Para Term Preterm Abortions TAB SAB Ect Mult Living                  Review of Systems  Constitutional: Negative for fever.  HENT: Negative for neck pain.   Eyes: Negative for visual disturbance.  Respiratory: Negative for shortness of breath.   Cardiovascular: Negative for chest pain.  Gastrointestinal: Negative for abdominal pain.  Genitourinary: Negative for flank pain.  Musculoskeletal: Negative for back pain.  Skin: Negative for rash.  Neurological: Negative for headaches.  Hematological: Does not bruise/bleed easily.  Psychiatric/Behavioral: Positive for dysphoric mood.    Allergies  Penicillins; Propoxyphene-acetaminophen; and Vancomycin cross reactors  Home Medications   Current Outpatient Rx  Name  Route  Sig  Dispense  Refill  . albuterol (PROVENTIL HFA;VENTOLIN HFA) 108 (90 BASE) MCG/ACT inhaler  Inhalation   Inhale 2 puffs into the lungs every 4 (four) hours as needed for wheezing or shortness of breath.         . amphetamine-dextroamphetamine (ADDERALL) 20 MG tablet   Oral   Take 20 mg by mouth 3 (three) times daily.         Marland Kitchen aspirin 81 MG chewable tablet   Oral   Chew 81 mg by mouth daily.          . Calcium Carbonate-Vitamin D (CALCIUM + D) 600-200 MG-UNIT per tablet   Oral   Take 1 tablet by mouth 2 (two) times daily.          . citalopram (CELEXA) 40 MG tablet   Oral   Take 40 mg by mouth daily.          Marland Kitchen estrogen-methylTESTOSTERone (ESTRATEST) 1.25-2.5 MG per tablet   Oral   Take 1 tablet by mouth 3 (three) times a week.         . fish oil-omega-3  fatty acids 1000 MG capsule   Oral   Take 1 g by mouth 3 (three) times daily.          Marland Kitchen ibuprofen (ADVIL,MOTRIN) 200 MG tablet   Oral   Take 200 mg by mouth every 6 (six) hours as needed. For pain         . LORazepam (ATIVAN) 1 MG tablet   Oral   Take 1 mg by mouth every 8 (eight) hours.          . methocarbamol (ROBAXIN) 500 MG tablet               . morphine (MSIR) 15 MG tablet   Oral   Take 15 mg by mouth 4 (four) times daily.          . Multiple Vitamin (MULITIVITAMIN WITH MINERALS) TABS   Oral   Take 1 tablet by mouth daily.         . nortriptyline (PAMELOR) 50 MG capsule   Oral   Take 50 mg by mouth 2 (two) times daily.          . QUEtiapine (SEROQUEL) 25 MG tablet   Oral   Take 25 mg by mouth at bedtime.          . Sennosides (EX-LAX PO)   Oral   Take 6 tablets by mouth daily as needed. For constipation         . SF 5000 PLUS 1.1 % CREA dental cream               . simvastatin (ZOCOR) 20 MG tablet   Oral   Take 20 mg by mouth at bedtime.          . traZODone (DESYREL) 100 MG tablet   Oral   Take 500 mg by mouth at bedtime. As prescribed by psych           BP 142/75  Pulse 104  Temp(Src) 99 F (37.2 C) (Oral)  Resp 18  SpO2 97%  Physical Exam  Nursing note and vitals reviewed. Constitutional: She is oriented to person, place, and time. She appears well-developed and well-nourished. No distress.  HENT:  Head: Atraumatic.  Mouth/Throat: Oropharynx is clear and moist.  Eyes: Conjunctivae are normal. Pupils are equal, round, and reactive to light. No scleral icterus.  Neck: Neck supple. No tracheal deviation present.  Cardiovascular: Normal rate, regular rhythm, normal heart sounds and intact distal pulses.  Pulmonary/Chest: Effort normal and breath sounds normal. No respiratory distress.  Abdominal: Soft. Normal appearance and bowel sounds are normal. She exhibits no distension. There is no tenderness.  Musculoskeletal:  She exhibits no edema and no tenderness.  Good rom bil extremities, no focal bony tenderness, no sts.   Neurological: She is alert and oriented to person, place, and time.  Skin: Skin is warm and dry. No rash noted.  Psychiatric:  Pt expresses paranoid thoughts, and labile mood - at one point will appear calm/collected, at next agitated/anxious, at next very tearful/crying.     ED Course  Procedures (including critical care time)   Results for orders placed during the hospital encounter of 02/25/13  CBC      Result Value Range   WBC 9.6  4.0 - 10.5 K/uL   RBC 4.82  3.87 - 5.11 MIL/uL   Hemoglobin 13.5  12.0 - 15.0 g/dL   HCT 16.1  09.6 - 04.5 %   MCV 82.6  78.0 - 100.0 fL   MCH 28.0  26.0 - 34.0 pg   MCHC 33.9  30.0 - 36.0 g/dL   RDW 40.9  81.1 - 91.4 %   Platelets 369  150 - 400 K/uL  COMPREHENSIVE METABOLIC PANEL      Result Value Range   Sodium 135  135 - 145 mEq/L   Potassium 3.4 (*) 3.5 - 5.1 mEq/L   Chloride 98  96 - 112 mEq/L   CO2 24  19 - 32 mEq/L   Glucose, Bld 114 (*) 70 - 99 mg/dL   BUN 11  6 - 23 mg/dL   Creatinine, Ser 7.82  0.50 - 1.10 mg/dL   Calcium 9.6  8.4 - 95.6 mg/dL   Total Protein 7.5  6.0 - 8.3 g/dL   Albumin 3.7  3.5 - 5.2 g/dL   AST 43 (*) 0 - 37 U/L   ALT 56 (*) 0 - 35 U/L   Alkaline Phosphatase 260 (*) 39 - 117 U/L   Total Bilirubin 0.4  0.3 - 1.2 mg/dL   GFR calc non Af Amer >90  >90 mL/min   GFR calc Af Amer >90  >90 mL/min       MDM  Pt w hx anxiety/depression, from [facility] fpr med clearance.  ?not eating/drinking or taking meds.  Labs.  k sl low. kcl po, meal.  Discussed w sw/act  Re return to [facility].  Pt w period agitation, attempted to calm, reassure. geodon 10 mg im for symptom management.  Recheck pt comfortable, cooperative, no distress.   Pt has eaten. kcl po as k sl low.   Pt appears stable for transport back to [place].         Suzi Roots, MD 02/25/13 [phone]

## 2013-02-25 NOTE — ED Notes (Signed)
Pt DC back to [facility] accompanied by Security, [employer] and [employer].

## 2013-02-25 NOTE — ED Notes (Signed)
This nurse informed by Security that Royetta Crochet, RN was getting "a counselor" for pt to talk to prior to DC since one had been promised by said RN per pt, will monitor.

## 2013-02-25 NOTE — ED Notes (Addendum)
This nurse had to wake pt up for DC instructions and pt became anxious/agitated when told she was being DC back to [place], repeating/yelling, "please don't send me back there". Pt refused to sign for DC instructions. Dr. Denton Lank aware, Security and [employer] as well as [employer] staff at bedside assisting pt into wheelchair for DC.

## 2013-02-25 NOTE — ED Notes (Signed)
Per pt, states she has been taking meds, states "they have not been giving her medication", states they have not fed her and she does not want to go back-Per [facility], she is here for forced medication r/t noncompliance

## 2013-02-28 ENCOUNTER — Telehealth: Payer: Self-pay | Admitting: Pulmonary Disease

## 2013-02-28 NOTE — Telephone Encounter (Signed)
Pt aware of results of CT scan per [facility] and his recommendations. Verified her home phone number which is correct in our system.  Pt is requesting copy be mailed to her home address.

## 2013-03-07 ENCOUNTER — Other Ambulatory Visit: Payer: Self-pay

## 2013-04-15 ENCOUNTER — Telehealth: Payer: Self-pay | Admitting: Pulmonary Disease

## 2013-04-15 NOTE — Telephone Encounter (Signed)
ATC PT received a message the person you are trying to reach is not accepting calls at this time to try my call again later Pravastatin is not on pt med list.  Has not see KC since 05/21/12

## 2013-04-18 NOTE — Telephone Encounter (Signed)
ATC pt at the first # provided - received msg that the person you are trying to reach is not accepting calls at this time.  Pls try your call again later. ATC pt at the alternative # provided.  Reached msg that you have reached a # that has been disconnected or is no longer in service.

## 2013-04-19 NOTE — Telephone Encounter (Signed)
ATC # provided and it states customer is not accepting calls at this time. Please try again later. I called the alternate number and is states it is disconnected. No other contact numbers. Per protocol I will sign off on message and await call back. We cannot fill the pravastatin sicne KC is out of office, it is not on med list. Carron Curie, CMA

## 2013-05-17 ENCOUNTER — Telehealth: Payer: Self-pay | Admitting: Pulmonary Disease

## 2013-05-17 DIAGNOSIS — R911 Solitary pulmonary nodule: Secondary | ICD-10-CM

## 2013-05-17 NOTE — Telephone Encounter (Signed)
Pt is due for MRI &/or CT in December.  Pt is moving and is wanting to know is she should have this done here in [place] at [place] or if she can have this done in [place](where she is moving)  Please advise Dr Shelle Iron where you prefer her have this completed. Thanks

## 2013-05-17 NOTE — Telephone Encounter (Signed)
The only advantage to [facility] is her new ct can be compared to her old one. If she has in [place], and that is fine, she will need to take a copy of her old ct on disk with her to [place], then will have to bring me a copy of her new ct on a disk for my review.

## 2013-05-18 ENCOUNTER — Telehealth: Payer: Self-pay | Admitting: Pulmonary Disease

## 2013-05-18 NOTE — Telephone Encounter (Signed)
This appt has been changed to 02/2014 lmtcb for pt [name]

## 2013-05-18 NOTE — Telephone Encounter (Signed)
KC this pt does not want to wait until June 2015 she wants to do the ct in both dec and June is this ok she has some personal reasons why Tobe Sos

## 2013-05-18 NOTE — Telephone Encounter (Signed)
PCC's please advise pt is not due until June 2015 for CT. Previous message stated pt was due in December. thanks

## 2013-05-18 NOTE — Telephone Encounter (Signed)
Lm for pt to call back Tobe Sos

## 2013-05-18 NOTE — Telephone Encounter (Signed)
Order put in folder fro 02/2014 [name]

## 2013-05-18 NOTE — Telephone Encounter (Signed)
Pt is not due for ct until June 2015.  See my notes.  Please cancel ct for December.

## 2013-05-18 NOTE — Telephone Encounter (Signed)
No this is not ok.  She does not need a ct until June 2015!!

## 2013-05-18 NOTE — Telephone Encounter (Signed)
Pt stated it is just easier to have it scheduled here in [place]. She stated if someone can call her today bc she will be moving tomorrow and it will be hectic. Please advise PCC's thanks

## 2013-05-19 NOTE — Telephone Encounter (Signed)
Spoke with the pt and notified of recs per [facility] She verbalized understanding Will forward to [place] to make her aware

## 2013-06-06 IMAGING — CT CT CHEST W/O CM
2 of 3 series · 15 of 34 positions shown, 19 images · non-contrast
Comparison: 02/27/2012

CLINICAL DATA: Evaluate pulmonary nodule

CT CHEST WITHOUT CONTRAST
TECHNIQUE: Multidetector CT imaging of the chest was performed
following the standard protocol without IV contrast.

[Series 2: routine chest · axial · 0.66mm/px · z∈[-256,-21]mm · 13 of 53 slices shown, 17 images]
[im 4/53  mediastinal]
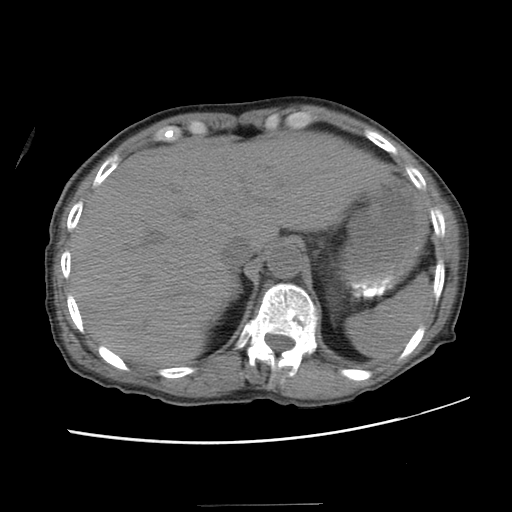
[im 4/53  lung]
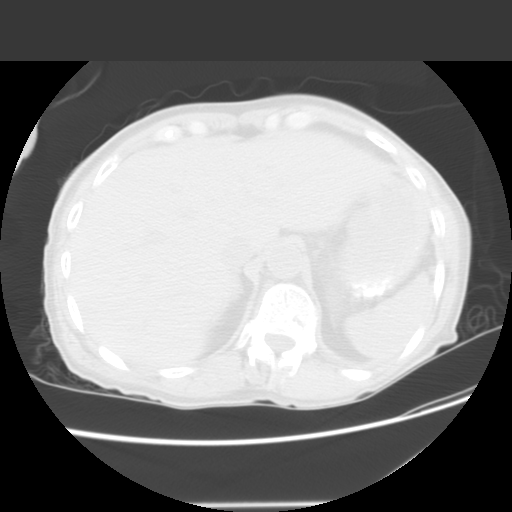
[im 8/53  lung]
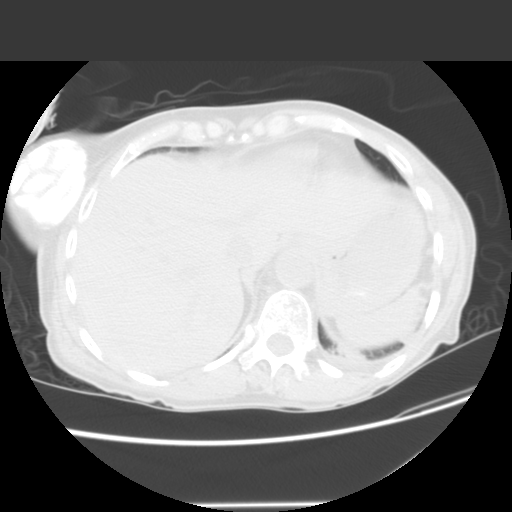
[im 12/53  lung]
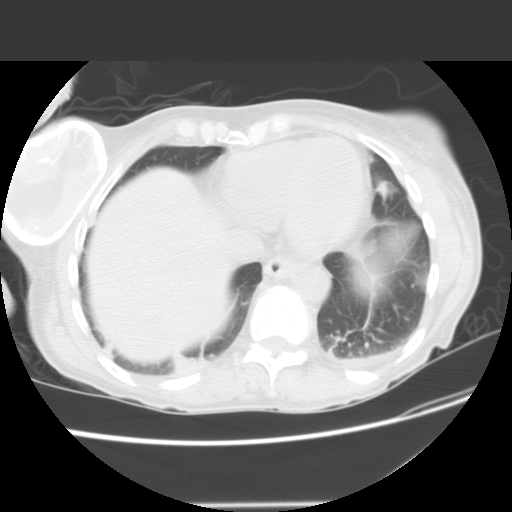
[im 16/53  lung]
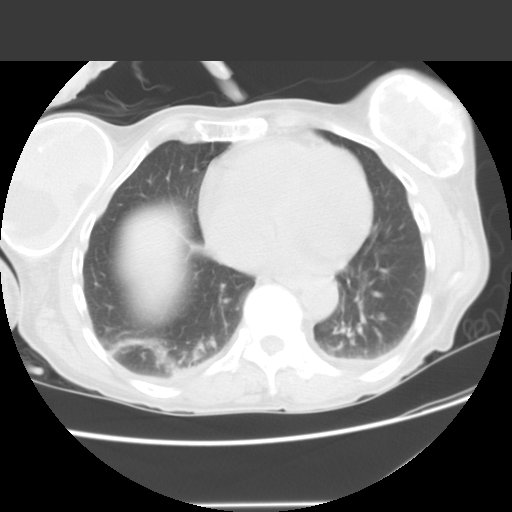
[im 20/53  mediastinal]
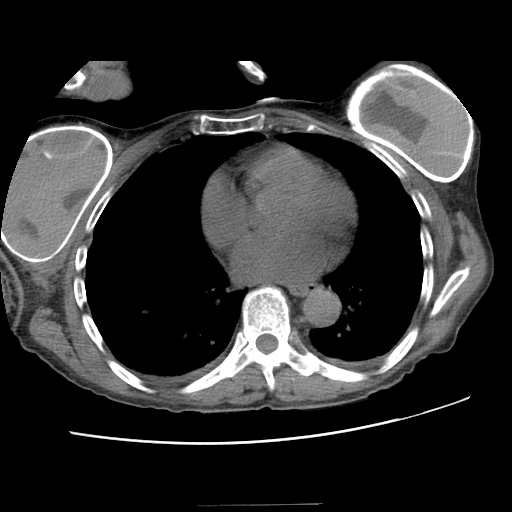
[im 20/53  lung]
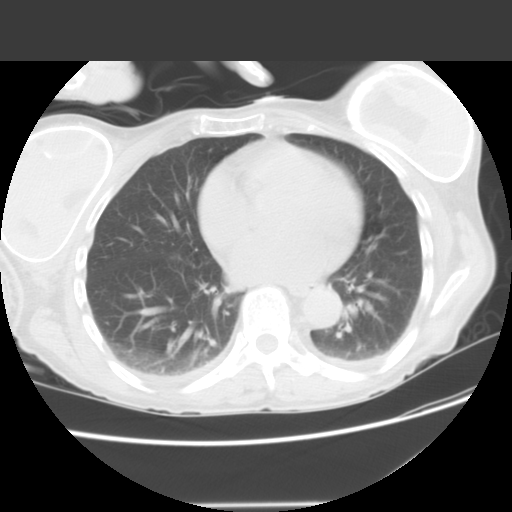
[im 24/53  lung]
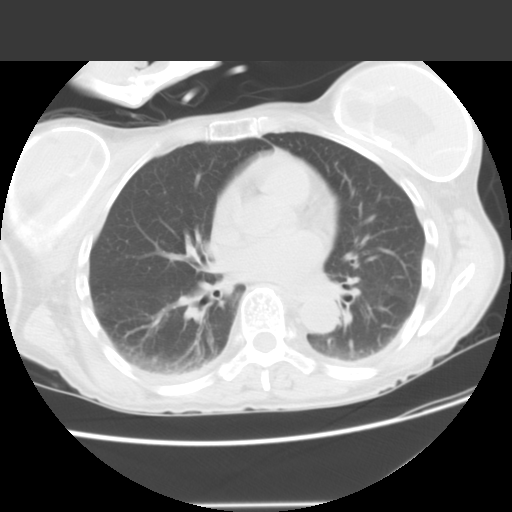
[im 27/53  lung]
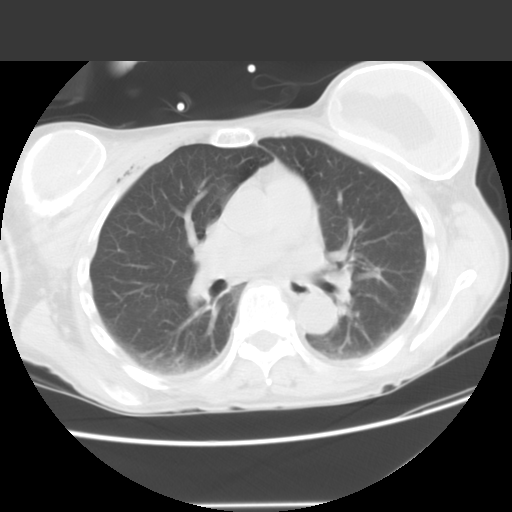
[im 31/53  lung]
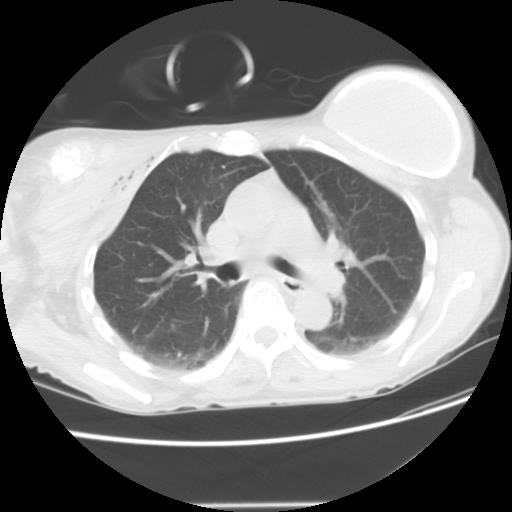
[im 35/53  mediastinal]
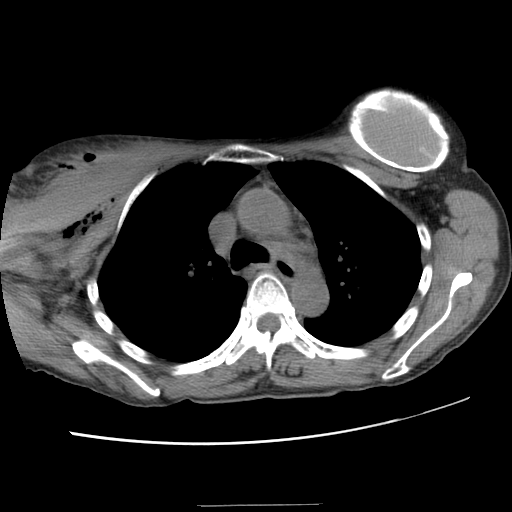
[im 35/53  lung]
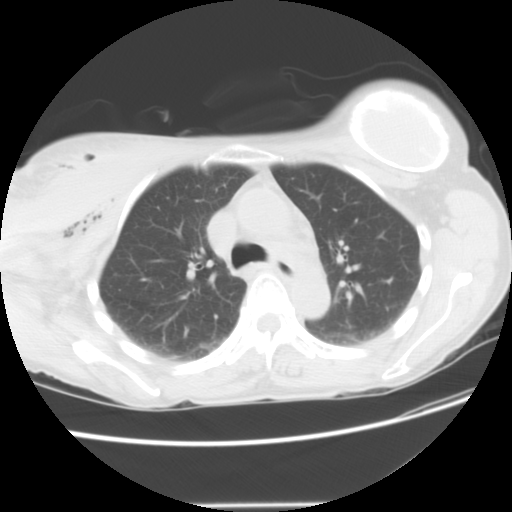
[im 39/53  lung]
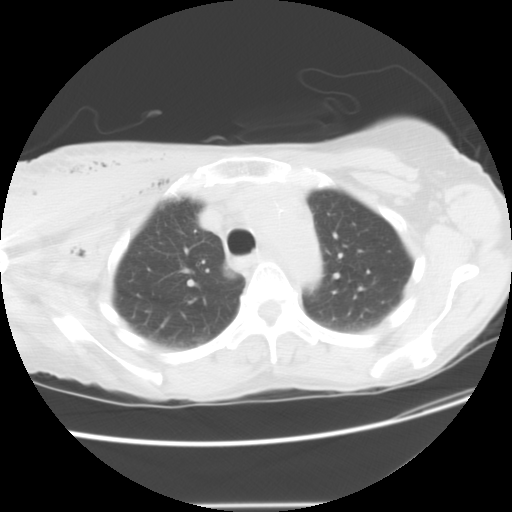
[im 43/53  lung]
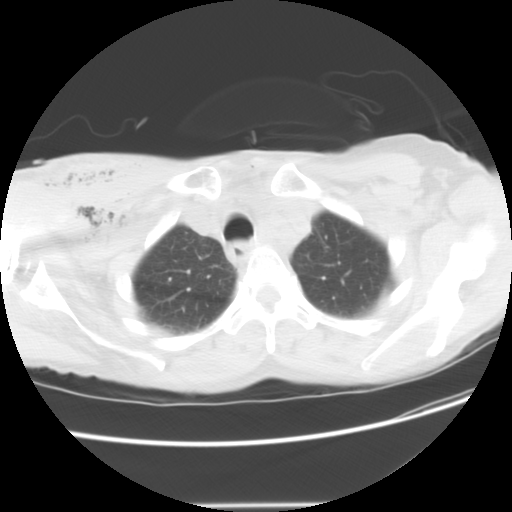
[im 47/53  lung]
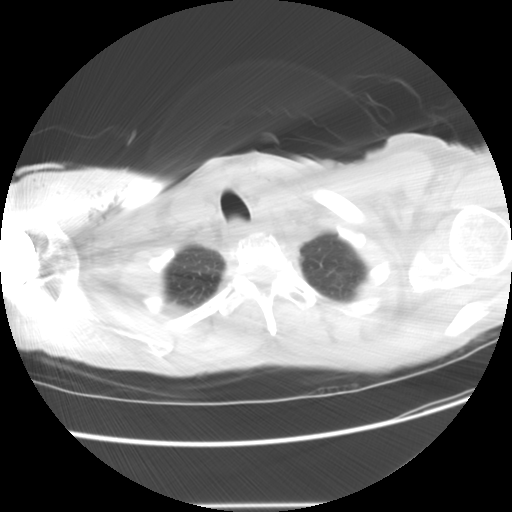
[im 51/53  mediastinal]
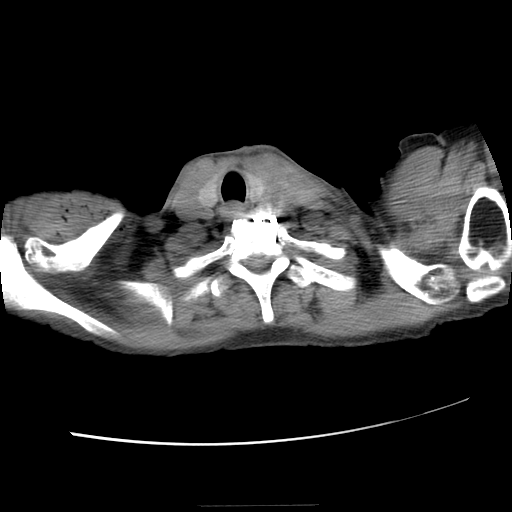
[im 51/53  lung]
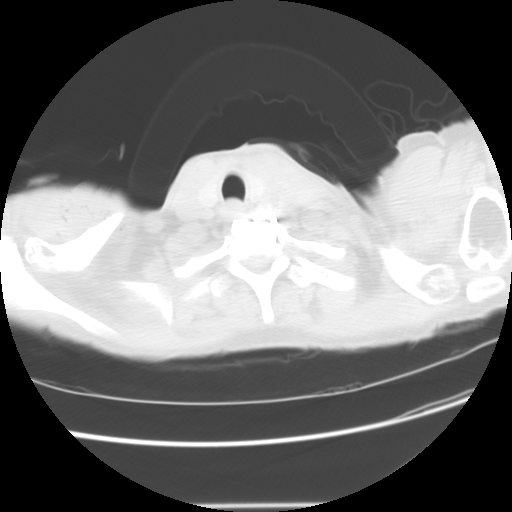

[Series 400: sagittal chest · sagittal · 0.66mm/px · 2 of 148 slices shown]
[im 50/148  lung]
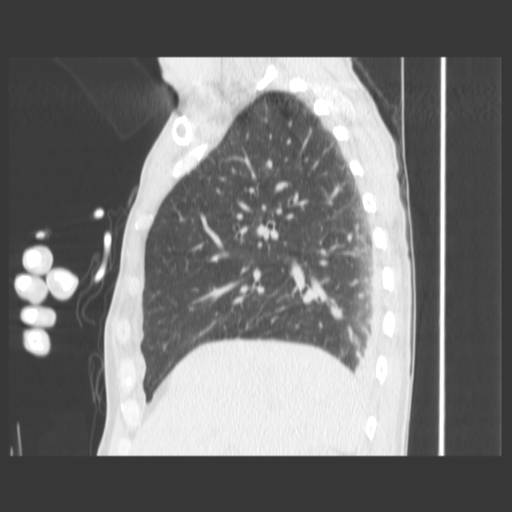
[im 99/148  lung]
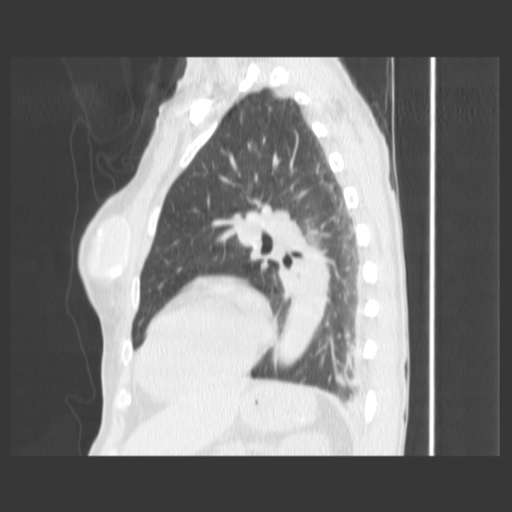

[15 of 34 positions shown; findings below may reference images not displayed]

FINDINGS: Prior right shoulder arthroplasty.  Bilateral breast
implants with calcification of the capsule.  There is a gas and
fluid collections within the right chest wall.  This measures
approximately 10.8 x 3.7 x 6.6 cm.

There is no mediastinal or hilar adenopathy.  Small bilateral
pleural effusions noted.  There is atelectasis identified within
both lung bases.

Pulmonary nodule within the right lower lobe measures 5.3 mm, image
29.

Review of the visualized osseous structures demonstrate multilevel
thoracic spondylosis and mild scoliosis.

Anterior side plate screw device is noted within the lower cervical
spine.
IMPRESSION: 1.  Small nodule in the right lower lobe measures 5.3 mm. If the
patient is at high risk for bronchogenic carcinoma, follow-up chest
CT at 6-12 months is recommended.  If the patient is at low risk
for bronchogenic carcinoma, follow-up chest CT at 12 months is
recommended.  This recommendation follows the consensus statement:
Guidelines for Management of Small Pulmonary Nodules Detected on CT
Scans: A Statement from the [HOSPITAL] as published in
2.  Small bilateral pleural effusions and bibasilar atelectasis.
3.  Gas and fluid collection within the right superior chest wall
is nonspecific in the early postoperative period.  This may
represent a hematoma or less likely abscess.  Careful clinical
correlation is advised.

## 2013-08-19 ENCOUNTER — Other Ambulatory Visit: Payer: Self-pay

## 2014-03-08 ENCOUNTER — Inpatient Hospital Stay: Admission: RE | Admit: 2014-03-08 | Payer: Self-pay | Source: Ambulatory Visit

## 2014-03-15 ENCOUNTER — Inpatient Hospital Stay: Admission: RE | Admit: 2014-03-15 | Payer: Self-pay | Source: Ambulatory Visit

## 2014-03-23 ENCOUNTER — Other Ambulatory Visit: Payer: Self-pay

## 2014-04-03 ENCOUNTER — Telehealth: Payer: Self-pay | Admitting: Pulmonary Disease

## 2014-04-03 NOTE — Telephone Encounter (Signed)
LMTCB

## 2014-04-04 NOTE — Telephone Encounter (Signed)
I can certainly compare the scans provided she brings a disk to me from [place], but the radiologist does this hundreds of times a day and is the expert.  If she doesn't want to get disk from here to take to [place] that is fine, but she still has to [facility].

## 2014-04-04 NOTE — Telephone Encounter (Signed)
Spoke to pt will call her 1st thing on wed with the appt @[place]  [facility] [name]

## 2014-04-04 NOTE — Telephone Encounter (Signed)
Called spoke with pt. She reports she can;t come all the way to [place] to pick up a disk for [facility] to compare. [facility] will not release one to them w/o release and she can't come sign a release either. She has no one to come to [place] to pick up disk per pt. She wants to know why can't she have [place] [facility] and Dr. Shelle Ironlance compare the ct's. Pt also aware [facility]. Please advise thanks

## 2014-04-04 NOTE — Telephone Encounter (Signed)
Ok with me, but see phone note 05/17/2013. She will need to pick up disk of old ct and take to [place] so they can compare.  She will then need to bring a disk of her new ct to [place] for my review.

## 2014-04-04 NOTE — Telephone Encounter (Signed)
Last OV 05-21-12. The pt had a ct set for June but she states [place] has [place] [facility] and [facility] # [phone]. Pt states she is going to continue to follow-up with [facility]. Please advise if ok to place another order for CT [place]. Carron CurieJennifer Gennie Eisinger, CMA

## 2014-04-04 NOTE — Telephone Encounter (Signed)
ATC PT X2. A lot of static/noise in the phone and could not hear anything. wcb

## 2014-04-04 NOTE — Telephone Encounter (Signed)
Pt aware of below. Please advise pcc's thanks

## 2014-04-05 NOTE — Telephone Encounter (Signed)
appt for chest [email or web address]  [facility] 04/10/14@8am  lmtcb

## 2014-04-06 NOTE — Telephone Encounter (Signed)
lmtcb again today [name]

## 2014-04-06 NOTE — Telephone Encounter (Signed)
Pt aware of this appt she will call to reschedule it because she can not go at that time [name]

## 2014-04-13 ENCOUNTER — Telehealth: Payer: Self-pay | Admitting: Pulmonary Disease

## 2014-04-13 NOTE — Telephone Encounter (Signed)
Called spoke with Alinda Moneyony. They are requesting report from pt last CT scan so the radiologists can have a comparison since pt did not bring them anything. This has been faxed over to her. Nothing further needed

## 2014-04-19 ENCOUNTER — Telehealth: Payer: Self-pay | Admitting: Pulmonary Disease

## 2014-04-19 NOTE — Telephone Encounter (Signed)
Annette DikeJennifer have you seen the report for the CT scan that was done at [place]?  thanks

## 2014-04-20 NOTE — Telephone Encounter (Signed)
Report placed in [facility] green folder to review.Annette CurieJennifer Fisher Williams, CMA

## 2014-04-21 NOTE — Telephone Encounter (Signed)
Do not see this in my green folder

## 2014-04-24 ENCOUNTER — Encounter: Payer: Self-pay | Admitting: Pulmonary Disease

## 2014-04-24 NOTE — Telephone Encounter (Signed)
The report is the first paperwork in your green folder. [facility]. Carron CurieJennifer Eivin Mascio, CMA

## 2014-04-24 NOTE — Telephone Encounter (Signed)
Discussed results with pt.

## 2014-05-09 ENCOUNTER — Encounter: Payer: Self-pay | Admitting: Pulmonary Disease

## 2015-03-23 ENCOUNTER — Encounter: Payer: Self-pay | Admitting: Internal Medicine

## 2019-10-06 ENCOUNTER — Ambulatory Visit: Payer: Self-pay | Admitting: Surgery

## 2019-10-07 ENCOUNTER — Other Ambulatory Visit: Payer: Self-pay

## 2019-10-07 DIAGNOSIS — Z20822 Contact with and (suspected) exposure to covid-19: Secondary | ICD-10-CM

## 2019-10-08 LAB — NOVEL CORONAVIRUS, NAA: SARS-CoV-2, NAA: NOT DETECTED

## 2019-10-14 ENCOUNTER — Other Ambulatory Visit: Payer: Self-pay

## 2019-10-14 DIAGNOSIS — Z20822 Contact with and (suspected) exposure to covid-19: Secondary | ICD-10-CM

## 2019-10-15 LAB — NOVEL CORONAVIRUS, NAA: SARS-CoV-2, NAA: NOT DETECTED

## 2020-01-30 ENCOUNTER — Telehealth: Payer: Self-pay | Admitting: Specialist

## 2020-01-30 NOTE — Telephone Encounter (Signed)
I called and advised that I will put her on that cancellation list

## 2020-01-30 NOTE — Telephone Encounter (Signed)
Patient called to get an appointment and was advised that Dr. Otelia Sergeant does not have anything available until June 23rd.  She was offered an appointment with Fayrene Fearing this Wednesday, but prefers Dr. Otelia Sergeant.  She stated that she is a lot of pain and does not think she can wait that long and requested to be put on the waiting list.  Patient does need a 3 days notice for transportation.  CB#[phone].  Thank you.

## 2020-02-07 ENCOUNTER — Ambulatory Visit (INDEPENDENT_AMBULATORY_CARE_PROVIDER_SITE_OTHER): Payer: Medicare Other | Admitting: Specialist

## 2020-02-07 ENCOUNTER — Ambulatory Visit: Payer: Self-pay

## 2020-02-07 ENCOUNTER — Encounter: Payer: Self-pay | Admitting: Specialist

## 2020-02-07 ENCOUNTER — Other Ambulatory Visit: Payer: Self-pay

## 2020-02-07 VITALS — BP 136/78 | HR 90 | Ht 69.0 in | Wt 116.0 lb

## 2020-02-07 DIAGNOSIS — M4105 Infantile idiopathic scoliosis, thoracolumbar region: Secondary | ICD-10-CM

## 2020-02-07 DIAGNOSIS — M5136 Other intervertebral disc degeneration, lumbar region: Secondary | ICD-10-CM

## 2020-02-07 DIAGNOSIS — M25511 Pain in right shoulder: Secondary | ICD-10-CM

## 2020-02-07 DIAGNOSIS — G8929 Other chronic pain: Secondary | ICD-10-CM

## 2020-02-07 DIAGNOSIS — M5442 Lumbago with sciatica, left side: Secondary | ICD-10-CM

## 2020-02-07 DIAGNOSIS — M25561 Pain in right knee: Secondary | ICD-10-CM | POA: Diagnosis not present

## 2020-02-07 DIAGNOSIS — R29898 Other symptoms and signs involving the musculoskeletal system: Secondary | ICD-10-CM

## 2020-02-07 DIAGNOSIS — M816 Localized osteoporosis [Lequesne]: Secondary | ICD-10-CM

## 2020-02-07 DIAGNOSIS — M542 Cervicalgia: Secondary | ICD-10-CM | POA: Diagnosis not present

## 2020-02-07 DIAGNOSIS — M47812 Spondylosis without myelopathy or radiculopathy, cervical region: Secondary | ICD-10-CM

## 2020-02-07 DIAGNOSIS — M1712 Unilateral primary osteoarthritis, left knee: Secondary | ICD-10-CM

## 2020-02-07 MED ORDER — HYDROCODONE-ACETAMINOPHEN 5-325 MG PO TABS: 1.0000 | ORAL_TABLET | Freq: Four times a day (QID) | ORAL | 0 refills | Status: DC | PRN

## 2020-02-07 MED ORDER — GABAPENTIN 100 MG PO CAPS: ORAL_CAPSULE | ORAL | 0 refills | Status: DC
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
## 2020-02-07 NOTE — Patient Instructions (Signed)
Plan: Schedule to be seen by Dr. August Saucer for left total knee replacement and consideration of revision of right shoulder hemiarthroplasty to a total shoulder replacement. Thoracolumbar scoliosis is severe and she has no focal deficit, should be followed and bone density test done to assess her level of osteoporosis, where to test is difficult due to numerous  ORIF and hip implant, DJD and DDD lumbar area and history of ORIF of wrists and previous  Calcanei fractures.  Cervical fusion C4 to T1 with anterior Cervical fusions and posterior rods and screws C4 to T1, No sign of myelopathy. Weakness right hand intrinsics is likely residual of cervical injury 3 years ago.  Takes advil, degree of deformity of left knee and patella lateral subluxation are causing tendency of knee to give away.  I am prescribing hydrocodone 5/325 1 tablet every 6 hours for pain #30 till  seen by Dr. August Saucer. She has too numerous to count areas of treated fractures due to osteoporosis or accident. Right proximal humeral neck with hemiarthroplasty, bilateral distal radii, Right femur then right hip with right THR with SROM femoral stem and greater troch wiring, left Hip pinning. Right tibial plateau ORIF and ORIF of right tibia fracture, history of bilateral calcaneal fractures. Cervical spine ACDF  C5-6 and C6-7 by Dr. Venetia Maxon in 2003 then C7 corpectomy and fusion C5 to T1 then cervical injury with surgical ACDF C4-5 and posterior fusion C4 to T1 3 years ago, she has anterolisthesis C2-3 and C3-4 that is mild but no focal  Deficits attributable to this area, cervicalgia appears to localize to spondylosis affecting the upper cervical segments C1 through C4. Start low dose gabapentin 100 mg qhs for 5 days then 100 mg po BID.

## 2020-02-09 ENCOUNTER — Telehealth: Payer: Self-pay | Admitting: Specialist

## 2020-02-09 NOTE — Telephone Encounter (Signed)
Patient called.    She is requesting a referral for home health care.    Call back: [phone] 

## 2020-02-09 NOTE — Telephone Encounter (Signed)
Patient called.    She is requesting a referral for home health care.    Call back: [phone]

## 2020-02-15 NOTE — Telephone Encounter (Signed)
I called and advised patient that home health would be setup for her while she is in the hospital for post op care.  She states that she needs it now. I advised that she would need to discuss this with the Dr. Claiborne Billings will be doing the surgery on her which is Dr. August Saucer and she is scheduled to see him on Friday 02/17/20.

## 2020-02-15 NOTE — Telephone Encounter (Signed)
Patient called checking on the status of the referral for home healthcare.  She is getting ready to have 2 surgeries and is needing someone to come in her home.  CB#[phone].  Thank you.

## 2020-02-15 NOTE — Telephone Encounter (Signed)
Please refer this to Dr. August Saucer.

## 2020-02-17 ENCOUNTER — Ambulatory Visit (INDEPENDENT_AMBULATORY_CARE_PROVIDER_SITE_OTHER): Payer: Medicare Other | Admitting: Orthopedic Surgery

## 2020-02-17 ENCOUNTER — Encounter: Payer: Self-pay | Admitting: Orthopedic Surgery

## 2020-02-17 ENCOUNTER — Other Ambulatory Visit: Payer: Self-pay

## 2020-02-17 DIAGNOSIS — M1712 Unilateral primary osteoarthritis, left knee: Secondary | ICD-10-CM | POA: Diagnosis not present

## 2020-02-17 MED ORDER — METHOCARBAMOL 500 MG PO TABS: 500.0000 mg | ORAL_TABLET | Freq: Three times a day (TID) | ORAL | 0 refills | Status: DC | PRN

## 2020-02-17 NOTE — Progress Notes (Signed)
Office Visit Note   Patient: [name]           Date of Birth: 1951-09-11           MRN: [id number] Visit Date: 02/17/2020 Requested by: Annette Mina, MD [place],  [place] [address] PCP: Annette Mina, MD  Subjective: Chief Complaint  Patient presents with   Left Knee - Pain   Right Shoulder - Pain    HPI: [name] is a patient with left knee and right shoulder pain.  She underwent right shoulder surgery about 8 years ago for post fracture deformity and injury.  She has had multiple surgeries.  She has had right hip replacement which resulted in good pain relief but slightly shorter leg on the right-hand side.  She does have a history of osteoporosis but lost 60 after taking oral osteoporosis medication.  Denies any cardiac issues.  She did have some type of infection after her hip surgery for which she underwent a second surgery as well as IV antibiotics.  Patient states that her right shoulder and left knee are extremely painful.  She has a friend in town but no other social support network.  She is a retired Marine scientist.  Denies any fevers or chills.              ROS: All systems reviewed are negative as they relate to the chief complaint within the history of present illness.  Patient denies  fevers or chills.   Assessment & Plan: Visit Diagnoses:  1. Arthritis of left knee     Plan: Impression is progressive rotator cuff arthropathy and swelling in the right shoulder status post hemiarthroplasty for fracture.  She has pretty significant functional limitations with the right arm but she does have a functional deltoid.  Patient also has end-stage left knee arthritis with valgus alignment.  She would like to have the knee addressed first.  Plan for the knee would be cemented total knee replacement with lateral release and lateral sided lengthening.  The risk and benefits are discussed with the patient include not limited to infection nerve vessel damage knee  stiffness as well as exacerbation of her leg length inequality due to straightening of the leg.  Patient understands risk benefits and wishes to proceed.  We can treat the shoulder after the knee.  That would be a more extensive procedure with conversion to reverse shoulder replacement.  Follow-Up Instructions: No follow-ups on file.   Orders:  No orders of the defined types were placed in this encounter.  Meds ordered this encounter  Medications   methocarbamol (ROBAXIN) 500 MG tablet    Sig: Take 1 tablet (500 mg total) by mouth every 8 (eight) hours as needed for muscle spasms.    Dispense:  30 tablet    Refill:  0      Procedures: No procedures performed   Clinical Data: No additional findings.  Objective: Vital Signs: There were no vitals taken for this visit.  Physical Exam:   Constitutional: Patient appears well-developed HEENT:  Head: Normocephalic Eyes:EOM are normal Neck: Normal range of motion Cardiovascular: Normal rate Pulmonary/chest: Effort normal Neurologic: Patient is alert Skin: Skin is warm Psychiatric: Patient has normal mood and affect    Ortho Exam: Ortho exam demonstrates full active and passive range of motion of the right knee.  Left knee demonstrates valgus alignment with 5 degree flexion contracture but she is able to bend past 90.  Pedal pulses are  palpable.  Ankle dorsiflexion plantarflexion intact on the left.  No groin pain with internal X rotation of that left leg.  She does have leg length discrepancy with right leg being shorter than the left by 1 to 2 cm.  Specialty Comments:  No specialty comments available.  Imaging: No results found.   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 No pertinent family history.  Past Surgical History:  Procedure Laterality Date   ABDOMINAL HYSTERECTOMY     BACK SURGERY     HARDWARE REMOVAL  02/27/2012   Procedure: HARDWARE REMOVAL;  Surgeon: Kerrin Champagne, MD;  Location: [facility] OR;  Service: Orthopedics;;  exploration and removal of portion of screw at T-!   left hip replacement  02-27-12   just screw - no replacement   NECK SURGERY     plates and screws   ORIF WRIST FRACTURE  1986   right   right femur replacement     ROTATOR CUFF REPAIR     right   SHOULDER HEMI-ARTHROPLASTY  02/27/2012   Procedure: SHOULDER HEMI-ARTHROPLASTY;  Surgeon: Kerrin Champagne, MD;  Location: [facility] OR;  Service: Orthopedics;  Laterality: Right;  Right shoulder hemiarthroplasty, repair right rotator cuff   WRIST SURGERY     tendon repair from a dog bite- left   Social History   Occupational History    Employer: UNEMPLOYED  Tobacco Use   Smoking status: Former Smoker    Packs/day: 0.75    Years: 50.00    Pack years: 37.50    Quit date: 11/14/2011    Years since quitting: 8.2   Smokeless tobacco: Never Used  Substance and Sexual Activity   Alcohol use: Yes   Drug use: No    Comment: quit in 1998   Sexual activity: Not on file

## 2020-02-22 ENCOUNTER — Other Ambulatory Visit: Payer: Self-pay | Admitting: Surgical

## 2020-02-22 ENCOUNTER — Telehealth: Payer: Self-pay | Admitting: Orthopedic Surgery

## 2020-02-22 MED ORDER — HYDROCODONE-ACETAMINOPHEN 5-325 MG PO TABS: 1.0000 | ORAL_TABLET | Freq: Two times a day (BID) | ORAL | 0 refills | Status: DC | PRN

## 2020-02-22 NOTE — Telephone Encounter (Signed)
Pls advise.  

## 2020-02-22 NOTE — Telephone Encounter (Signed)
Patient called. She would like some pain medication called in for her. Her call back number is [phone]

## 2020-02-29 ENCOUNTER — Telehealth: Payer: Self-pay | Admitting: Orthopedic Surgery

## 2020-02-29 ENCOUNTER — Other Ambulatory Visit: Payer: Self-pay | Admitting: Surgical

## 2020-02-29 MED ORDER — HYDROCODONE-ACETAMINOPHEN 5-325 MG PO TABS: 1.0000 | ORAL_TABLET | Freq: Two times a day (BID) | ORAL | 0 refills | Status: DC | PRN

## 2020-02-29 NOTE — Telephone Encounter (Signed)
Please advise. Thanks.  

## 2020-02-29 NOTE — Telephone Encounter (Signed)
I don't see any reason in her chart that she can't be moved up but I'll leave that for Dr. August Saucer.  I'll send in RX for Norco but her last RX was one week ago and she needs to make these last until surgery as any preop narcotic medication will make her postop pain worse. Don't want to refill for any more prescriptions after this one prior to procedure.

## 2020-02-29 NOTE — Telephone Encounter (Signed)
See response below about getting surgery moved up.

## 2020-02-29 NOTE — Telephone Encounter (Signed)
Agree with luke

## 2020-02-29 NOTE — Telephone Encounter (Signed)
Patient wants to know if she can move her left total knee surgery from July 13th to a sooner date.  She stated to be sure and let you know the dental implants are not going to be done anytime soon.  She is requesting something for pain to get her through until surgery is scheduled.  She asked that you not call in tramadol since it does not touch the pain.

## 2020-03-01 ENCOUNTER — Telehealth: Payer: Self-pay | Admitting: Orthopedic Surgery

## 2020-03-01 ENCOUNTER — Other Ambulatory Visit: Payer: Self-pay

## 2020-03-01 DIAGNOSIS — M1712 Unilateral primary osteoarthritis, left knee: Secondary | ICD-10-CM

## 2020-03-01 NOTE — Telephone Encounter (Signed)
noted 

## 2020-03-01 NOTE — Telephone Encounter (Signed)
Order faxed. Can you please document on referral? Thanks

## 2020-03-01 NOTE — Telephone Encounter (Signed)
Patient called.   She needs a prescription for a rollator faxed to [facility]   Fax number: [phone]

## 2020-03-06 ENCOUNTER — Telehealth: Payer: Self-pay | Admitting: Orthopedic Surgery

## 2020-03-06 NOTE — Telephone Encounter (Signed)
Victorino Dike Raddick from [facility] called.   At the patient's request, she wanted to know if the patient can get set up for home health care after surgery.   Call patient with the answer: [phone]

## 2020-03-07 ENCOUNTER — Ambulatory Visit: Payer: Medicare HMO | Admitting: Specialist

## 2020-03-07 ENCOUNTER — Telehealth: Payer: Self-pay | Admitting: Orthopedic Surgery

## 2020-03-07 NOTE — Telephone Encounter (Signed)
Tried calling patient. No answer, no VM to LM. Typically after joint replacement-specifically knee, home health is arranged before patient is d/c from hospital following surgery.

## 2020-03-07 NOTE — Telephone Encounter (Signed)
Patient called  She previously requested a rx for a Rolator be sent to [facility]. The notes in her chart said it was sent but she claims they still don't have it. She is now wanting to get a rx for a recliner that stands her up in addition.  Call back: [phone]

## 2020-03-08 NOTE — Telephone Encounter (Signed)
Mailed to patient per her request.

## 2020-03-12 ENCOUNTER — Telehealth: Payer: Self-pay | Admitting: Orthopedic Surgery

## 2020-03-12 NOTE — Telephone Encounter (Signed)
Patient called to give correct fax# for [facility]   Fax# is [phone]  Office ph# is [phone]    The number to contact patient is [phone]

## 2020-03-15 ENCOUNTER — Telehealth: Payer: Self-pay | Admitting: Orthopedic Surgery

## 2020-03-15 NOTE — Telephone Encounter (Signed)
Patient called.   I informed her of the previous messages but she states that she just moved here and doesn't have a PCP here yet. She is insisting I ask Dr. Otelia Williams to fill the rx as well.

## 2020-03-15 NOTE — Telephone Encounter (Signed)
Patient called requesting a refill of albuterol inhaler. Please send to pharmacy on file. Phone number is [phone].

## 2020-03-15 NOTE — Telephone Encounter (Signed)
faxed

## 2020-03-15 NOTE — Telephone Encounter (Signed)
IC LMVM with details advising should reach out to her PCP

## 2020-03-15 NOTE — Telephone Encounter (Signed)
I called and advised patient that she needs to contact whoever had been prescribing it for to see if they can refill it till she can get a PCP here. She states that she just moved to [facility], I suggested an UC may be able to get her a refill on it.

## 2020-03-21 ENCOUNTER — Other Ambulatory Visit (HOSPITAL_COMMUNITY): Payer: Medicare Other

## 2020-03-23 ENCOUNTER — Other Ambulatory Visit (HOSPITAL_COMMUNITY): Payer: Medicare Other

## 2020-03-23 ENCOUNTER — Telehealth: Payer: Self-pay | Admitting: Orthopedic Surgery

## 2020-03-23 NOTE — Telephone Encounter (Signed)
Patient called stating she is having surgery on this coming Tuesday 03/27/20. Patient has not received instruction on what she needs to do before surgery. Please call patient at [phone].

## 2020-03-23 NOTE — Telephone Encounter (Signed)
Would Dr. August Saucer or Franky Macho be able to do this?

## 2020-03-23 NOTE — Telephone Encounter (Signed)
Unable to prescribe narcotics without visit or recent encounter. jen

## 2020-03-23 NOTE — Telephone Encounter (Signed)
See below. Please advise. Thanks.  

## 2020-03-26 ENCOUNTER — Encounter (HOSPITAL_COMMUNITY): Payer: Self-pay

## 2020-03-26 ENCOUNTER — Inpatient Hospital Stay (HOSPITAL_COMMUNITY)
Admission: RE | Admit: 2020-03-26 | Discharge: 2020-03-26 | Disposition: A | Discharge status: Home / Self Care | Payer: Medicaid Other | Source: Ambulatory Visit

## 2020-03-26 ENCOUNTER — Telehealth: Payer: Self-pay | Admitting: Orthopedic Surgery

## 2020-03-26 ENCOUNTER — Other Ambulatory Visit: Payer: Self-pay

## 2020-03-26 HISTORY — DX: Cardiac arrhythmia, unspecified: I49.9

## 2020-03-26 HISTORY — DX: Unspecified osteoarthritis, unspecified site: M19.90

## 2020-03-26 NOTE — Progress Notes (Signed)
Called Dr. Diamantina Providence office and spoke with Crystal to relay message to Dr. August Saucer that patient did not show up for PAT appointment due to transportation issues.

## 2020-03-26 NOTE — Telephone Encounter (Signed)
See below. Sending as Lorain Childes

## 2020-03-26 NOTE — Telephone Encounter (Signed)
Christine from Pre Admissions at [facility] called to notify Dr. August Saucer that patient didn't not show for pre admission. No labs were done. Wynona Canes stated she did contact patient to find out what happened and patient stated not to have had transportation to appt. Wynona Canes also stated she did do verbal pre op questionnaire and phone interview over the phone and asked patient will she be at surgery on tomorrow's date 03/27/20. Wynona Canes stated patient said she will be at surgery 03/27/20. This message is urgent. Christine phone number is [phone].

## 2020-03-26 NOTE — Progress Notes (Addendum)
Patient did not show up to PAT appointment due to transportation issues. Same-Day Phone Call conducted instead:  PCP - Paulino Door, MD Cardiologist - Denies  PPM/ICD - Denies  Chest x-ray - 08/05/19 (C.E.) EKG - DOS Stress Test - Denies ECHO - per patient, 25-30 years ago. Initially done b/c patient was experiencing palpitations, which is how mitral prolapse was discovered. Per patient, has not had to follow up since then  nor does she see a cardiologist.  Cardiac Cath - Denies  Sleep Study - Denies  Patient denies being diabetic.  Blood Thinner Instructions: N/A Aspirin Instructions: N/A  ERAS Protcol - Yes PRE-SURGERY Ensure or G2- Ordered  COVID TEST- DOS   Anesthesia review: Yes, to assess for mitral prolapse.  Patient denies shortness of breath, fever, cough and chest pain at PAT appointment   All instructions explained to the patient, with a verbal understanding of the material. Patient agrees to go over the instructions while at home for a better understanding. Patient also instructed to self quarantine after being tested for COVID-19. The opportunity to ask questions was provided.

## 2020-03-26 NOTE — Anesthesia Preprocedure Evaluation (Addendum)
Anesthesia Evaluation    Reviewed: Allergy & Precautions, Patient's Chart, lab work & pertinent test results  Airway Mallampati: I  TM Distance: >3 FB Neck ROM: Full    Dental  (+) Dental Advisory Given, Poor Dentition, Chipped, Missing,    Pulmonary asthma , Current Smoker,  Still smoking 1/2ppd, 38 pack year history  Inhalers: albuterol- last used weeks ago   Pulmonary exam normal  + decreased breath sounds      Cardiovascular hypertension, Normal cardiovascular exam+ dysrhythmias (palpitations) + Valvular Problems/Murmurs MVP  Rhythm:Regular Rate:Normal  MVP on echo done 20-30 years ago, has not followed up since then  Hypertensive today to 008Q systolic, does not take BP meds- states she never has needed them   Neuro/Psych PSYCHIATRIC DISORDERS Anxiety Depression negative neurological ROS     GI/Hepatic Neg liver ROS, GERD  Controlled and Medicated,  Endo/Other  negative endocrine ROS  Renal/GU negative Renal ROS  negative genitourinary   Musculoskeletal  (+) Arthritis , Osteoarthritis,    Abdominal Normal abdominal exam  (+)   Peds  Hematology negative hematology ROS (+)   Anesthesia Other Findings   Reproductive/Obstetrics negative OB ROS                            Anesthesia Physical Anesthesia Plan  ASA: III  Anesthesia Plan: MAC, Regional and Spinal   Post-op Pain Management:  Regional for Post-op pain   Induction:   PONV Risk Score and Plan: 2 and Propofol infusion, TIVA and Treatment may vary due to age or medical condition  Airway Management Planned: Natural Airway and Nasal Cannula  Additional Equipment: None  Intra-op Plan:   Post-operative Plan:   Informed Consent: I have reviewed the patients History and Physical, chart, labs and discussed the procedure including the risks, benefits and alternatives for the proposed anesthesia with the patient or authorized  representative who has indicated his/her understanding and acceptance.       Plan Discussed with: CRNA  Anesthesia Plan Comments:        Anesthesia Quick Evaluation

## 2020-03-26 NOTE — Telephone Encounter (Signed)
hmmm

## 2020-03-26 NOTE — Pre-Procedure Instructions (Addendum)
[id number] - [place], [place] - [address] [place] [address] Phone: [phone] Fax: [phone]     Your procedure is scheduled on Tuesday June 13 from 11:41 AM- 2:33 PM .  Report to [facility] Main Entrance "A" at 08:40 A.M., and check in at the Admitting office.  Call this number if you have problems the morning of surgery:  [phone]  Call [phone] if you have any questions prior to your surgery date Monday-Friday 8am-4pm.    Remember:  Do not eat after midnight the night before your surgery.  You may drink clear liquids until 08:40 AM the morning of your surgery.   Clear liquids allowed are: Water, Non-Citrus Juices (without pulp), Carbonated Beverages, Clear Tea, Black Coffee Only, and Gatorade.   Enhanced Recovery after Surgery for Orthopedics Enhanced Recovery after Surgery is a protocol used to improve the stress on your body and your recovery after surgery.  Patient Instructions  . The night before surgery:  o No food after midnight. ONLY clear liquids after midnight  .  Marland Kitchen The day of surgery (if you do NOT have diabetes):  o Drink ONE (1) Pre-Surgery Clear Ensure by 08:40 AM the morning of surgery.   o This drink was given to you during your hospital  pre-op appointment visit. o Nothing else to drink after completing the  Pre-Surgery Clear Ensure.          If you have questions, please contact your surgeon's office.     Take these medicines the morning of surgery with A SIP OF WATER: venlafaxine XR (EFFEXOR-XR)  IF NEEDED: LORazepam (ATIVAN) albuterol (PROVENTIL HFA;VENTOLIN HFA) inhaler- bring with you the day of surgery.    As of today, STOP taking any Aspirin (unless otherwise instructed by your surgeon) Aleve, Naproxen, Ibuprofen, Motrin, Advil, Goody's, BC's, all herbal medications, fish oil, and all vitamins.          The Morning of Surgery:            Do not wear jewelry,  make up, or nail polish.            Do not wear lotions, powders, perfumes, or deodorant.            Do not shave 48 hours prior to surgery.             Do not bring valuables to the hospital.            [facility] is not responsible for any belongings or valuables.  Do NOT Smoke (Tobacco/Vaping) or drink Alcohol 24 hours prior to your procedure.  If you use a CPAP at night, you may bring all equipment for your overnight stay.   Contacts, glasses, dentures or bridgework may not be worn into surgery.      For patients admitted to the hospital, discharge time will be determined by your treatment team.   Patients discharged the day of surgery will not be allowed to drive home, and someone needs to stay with them for 24 hours.    Special instructions:   [place]- Preparing For Surgery  Before surgery, you can play an important role. Because skin is not sterile, your skin needs to be as free of germs as possible. You can reduce the number of germs on your skin by washing with CHG (chlorahexidine gluconate) Soap before surgery.  CHG is an antiseptic cleaner which kills  germs and bonds with the skin to continue killing germs even after washing.    Oral Hygiene is also important to reduce your risk of infection.  Remember - BRUSH YOUR TEETH THE MORNING OF SURGERY WITH YOUR REGULAR TOOTHPASTE  Please do not use if you have an allergy to CHG or antibacterial soaps. If your skin becomes reddened/irritated stop using the CHG.  Do not shave (including legs and underarms) for at least 48 hours prior to first CHG shower. It is OK to shave your face.  Please follow these instructions carefully.   1. Shower the NIGHT BEFORE SURGERY and the MORNING OF SURGERY with CHG Soap.   2. If you chose to wash your hair, wash your hair first as usual with your normal shampoo.  3. After you shampoo, rinse your hair and body thoroughly to remove the shampoo.  4. Use CHG as you would any other liquid soap.  You can apply CHG directly to the skin and wash gently with a scrungie or a clean washcloth.   5. Apply the CHG Soap to your body ONLY FROM THE NECK DOWN.  Do not use on open wounds or open sores. Avoid contact with your eyes, ears, mouth and genitals (private parts). Wash Face and genitals (private parts)  with your normal soap.   6. Wash thoroughly, paying special attention to the area where your surgery will be performed.  7. Thoroughly rinse your body with warm water from the neck down.  8. DO NOT shower/wash with your normal soap after using and rinsing off the CHG Soap.  9. Pat yourself dry with a CLEAN TOWEL.  10. Wear CLEAN PAJAMAS to bed the night before surgery  11. Place CLEAN SHEETS on your bed the night of your first shower and DO NOT SLEEP WITH PETS.   Day of Surgery: Wear Clean/Comfortable clothing the morning of surgery Do not apply any deodorants/lotions.   Remember to brush your teeth WITH YOUR REGULAR TOOTHPASTE.   Please read over the following fact sheets that you were given.

## 2020-03-26 NOTE — Pre-Procedure Instructions (Signed)
AYLEEN MCKINSTRY  03/26/2020      [id number] - [place], [place] - [address] [place] [address] Phone: [phone] Fax: [phone]    Your procedure is scheduled on July 13  Report to [facility] entrance A at 8:40  A.M.  Call this number if you have problems the morning of surgery:  [phone]   Remember:  Do not eat  after midnight.   You may drink clear liquids until 8:40 A.M..              Clear liquids allowed are:                    Water, Juice (non-citric and without pulp - diabetics please choose diet or no sugar options), Carbonated beverages - (diabetics please choose diet or no sugar options), Clear Tea, Black Coffee only (no creamer, milk or cream including half and half), Plain Jell-O only (diabetics please choose diet or no sugar options), Gatorade (diabetics please choose diet or no sugar options) and Plain Popsicles only      Enhanced Recovery after Surgery for Orthopedics Enhanced Recovery after Surgery is a protocol used to improve the stress on your body and your recovery after surgery.  Patient Instructions   The night before surgery:  o No food after midnight. ONLY clear liquids after midnight     The day of surgery (if you do NOT have diabetes):  o Drink ONE (1) Pre-Surgery Clear Ensure by _____ am the morning of surgery   o This drink was given to you during your hospital  pre-op appointment visit. o Nothing else to drink after completing the  Pre-Surgery Clear Ensure.          If you have questions, please contact your surgeons office.     Take these medicines the morning of surgery with A SIP OF WATER :              Venlafaxine (effexor-xr)              Cyproheptadine (periactin)            If needed:           Albuterol inhaler--bring to hospital           Lorazepam (ativan)        7 days prior to surgery STOP taking any Aspirin (unless otherwise instructed by your  surgeon), Aleve, Naproxen, Ibuprofen, Motrin, Advil, Goody's, BC's, all herbal medications, fish oil, and all vitamins.               Do not wear jewelry, make-up or nail polish.  Do not wear lotions, powders, or perfumes, or deodorant.  Do not shave 48 hours prior to surgery.  Men may shave face and neck.  Do not bring valuables to the hospital.  [facility] is not responsible for any belongings or valuables.  Contacts, dentures or bridgework may not be worn into surgery.  Leave your suitcase in the car.  After surgery it may be brought to your room.  For patients admitted to the hospital, discharge time will be determined by your treatment team.  Patients discharged the day of surgery will not be allowed to drive home.    Special instructions:   [place]- Preparing For Surgery  Before surgery, you can play an important role. Because skin is not sterile, your  skin needs to be as free of germs as possible. You can reduce the number of germs on your skin by washing with CHG (chlorahexidine gluconate) Soap before surgery.  CHG is an antiseptic cleaner which kills germs and bonds with the skin to continue killing germs even after washing.    Oral Hygiene is also important to reduce your risk of infection.  Remember - BRUSH YOUR TEETH THE MORNING OF SURGERY WITH YOUR REGULAR TOOTHPASTE  Please do not use if you have an allergy to CHG or antibacterial soaps. If your skin becomes reddened/irritated stop using the CHG.  Do not shave (including legs and underarms) for at least 48 hours prior to first CHG shower. It is OK to shave your face.  Please follow these instructions carefully.   1. Shower the NIGHT BEFORE SURGERY and the MORNING OF SURGERY with CHG.   2. If you chose to wash your hair, wash your hair first as usual with your normal shampoo.  3. After you shampoo, rinse your hair and body thoroughly to remove the shampoo.  4. Use CHG as you would any other liquid soap. You can  apply CHG directly to the skin and wash gently with a scrungie or a clean washcloth.   5. Apply the CHG Soap to your body ONLY FROM THE NECK DOWN.  Do not use on open wounds or open sores. Avoid contact with your eyes, ears, mouth and genitals (private parts). Wash Face and genitals (private parts)  with your normal soap.  6. Wash thoroughly, paying special attention to the area where your surgery will be performed.  7. Thoroughly rinse your body with warm water from the neck down.  8. DO NOT shower/wash with your normal soap after using and rinsing off the CHG Soap.  9. Pat yourself dry with a CLEAN TOWEL.  10. Wear CLEAN PAJAMAS to bed the night before surgery, wear comfortable clothes the morning of surgery  11. Place CLEAN SHEETS on your bed the night of your first shower and DO NOT SLEEP WITH PETS.    Day of Surgery:  Do not apply any deodorants/lotions.  Please wear clean clothes to the hospital/surgery center.   Remember to brush your teeth WITH YOUR REGULAR TOOTHPASTE.    Please read over the following fact sheets that you were given. Coughing and Deep Breathing and Surgical Site Infection Prevention

## 2020-03-27 ENCOUNTER — Ambulatory Visit (HOSPITAL_COMMUNITY): Payer: Medicare Other | Admitting: Physician Assistant

## 2020-03-27 ENCOUNTER — Inpatient Hospital Stay (HOSPITAL_COMMUNITY)
Admission: RE | Admit: 2020-03-27 | Discharge: 2020-04-03 | DRG: 470 | Disposition: A | Discharge status: Skilled Nursing Facility | Payer: Medicare Other | Attending: Orthopedic Surgery | Admitting: Orthopedic Surgery

## 2020-03-27 ENCOUNTER — Encounter (HOSPITAL_COMMUNITY)
Admission: RE | Disposition: A | Discharge status: Skilled Nursing Facility | Payer: Self-pay | Source: Home / Self Care | Attending: Orthopedic Surgery

## 2020-03-27 ENCOUNTER — Observation Stay (HOSPITAL_COMMUNITY): Payer: Medicare Other

## 2020-03-27 ENCOUNTER — Encounter (HOSPITAL_COMMUNITY): Payer: Self-pay | Admitting: Orthopedic Surgery

## 2020-03-27 DIAGNOSIS — Z90710 Acquired absence of both cervix and uterus: Secondary | ICD-10-CM

## 2020-03-27 DIAGNOSIS — E7800 Pure hypercholesterolemia, unspecified: Secondary | ICD-10-CM | POA: Diagnosis present

## 2020-03-27 DIAGNOSIS — M1712 Unilateral primary osteoarthritis, left knee: Principal | ICD-10-CM | POA: Diagnosis present

## 2020-03-27 DIAGNOSIS — I10 Essential (primary) hypertension: Secondary | ICD-10-CM | POA: Diagnosis present

## 2020-03-27 DIAGNOSIS — K219 Gastro-esophageal reflux disease without esophagitis: Secondary | ICD-10-CM | POA: Diagnosis present

## 2020-03-27 DIAGNOSIS — Z20822 Contact with and (suspected) exposure to covid-19: Secondary | ICD-10-CM | POA: Diagnosis present

## 2020-03-27 DIAGNOSIS — F17210 Nicotine dependence, cigarettes, uncomplicated: Secondary | ICD-10-CM | POA: Diagnosis present

## 2020-03-27 DIAGNOSIS — Z96642 Presence of left artificial hip joint: Secondary | ICD-10-CM | POA: Diagnosis present

## 2020-03-27 DIAGNOSIS — Z9889 Other specified postprocedural states: Secondary | ICD-10-CM

## 2020-03-27 DIAGNOSIS — M810 Age-related osteoporosis without current pathological fracture: Secondary | ICD-10-CM | POA: Diagnosis present

## 2020-03-27 DIAGNOSIS — E785 Hyperlipidemia, unspecified: Secondary | ICD-10-CM | POA: Diagnosis present

## 2020-03-27 HISTORY — PX: TOTAL KNEE ARTHROPLASTY: SHX125

## 2020-03-27 LAB — CBC
HCT: 42.1 % (ref 36.0–46.0)
Hemoglobin: 13.9 g/dL (ref 12.0–15.0)
MCH: 28 pg (ref 26.0–34.0)
MCHC: 33 g/dL (ref 30.0–36.0)
MCV: 84.9 fL (ref 80.0–100.0)
Platelets: 390 10*3/uL (ref 150–400)
RBC: 4.96 MIL/uL (ref 3.87–5.11)
RDW: 14 % (ref 11.5–15.5)
WBC: 8.4 10*3/uL (ref 4.0–10.5)
nRBC: 0 % (ref 0.0–0.2)

## 2020-03-27 LAB — SARS CORONAVIRUS 2 BY RT PCR (HOSPITAL ORDER, PERFORMED IN ~~LOC~~ HOSPITAL LAB): SARS Coronavirus 2: NEGATIVE

## 2020-03-27 LAB — SURGICAL PCR SCREEN
MRSA, PCR: NEGATIVE
Staphylococcus aureus: POSITIVE — AB

## 2020-03-27 LAB — BASIC METABOLIC PANEL
Anion gap: 12 (ref 5–15)
BUN: 17 mg/dL (ref 8–23)
CO2: 25 mmol/L (ref 22–32)
Calcium: 9.5 mg/dL (ref 8.9–10.3)
Chloride: 101 mmol/L (ref 98–111)
Creatinine, Ser: 1.37 mg/dL — ABNORMAL HIGH (ref 0.44–1.00)
GFR calc Af Amer: 45 mL/min — ABNORMAL LOW (ref >60)
GFR calc non Af Amer: 39 mL/min — ABNORMAL LOW (ref >60)
Glucose, Bld: 111 mg/dL — ABNORMAL HIGH (ref 70–99)
Potassium: 3.6 mmol/L (ref 3.5–5.1)
Sodium: 138 mmol/L (ref 135–145)

## 2020-03-27 SURGERY — ARTHROPLASTY, KNEE, TOTAL
Anesthesia: Monitor Anesthesia Care | Site: Knee | Laterality: Left

## 2020-03-27 MED ORDER — DOCUSATE SODIUM 100 MG PO CAPS
100.0000 mg | ORAL_CAPSULE | Freq: Two times a day (BID) | ORAL | Status: DC
  Administered 2020-03-27 – 2020-04-03 (×14): 100 mg via ORAL
  Filled 2020-03-27 (×14): qty 1

## 2020-03-27 MED ORDER — CHLORHEXIDINE GLUCONATE 0.12 % MT SOLN
OROMUCOSAL | Status: AC
  Administered 2020-03-27: 15 mL
  Filled 2020-03-27: qty 15

## 2020-03-27 MED ORDER — VANCOMYCIN HCL 1000 MG IV SOLR
INTRAVENOUS | Status: DC | PRN
  Administered 2020-03-27: 1000 mg

## 2020-03-27 MED ORDER — TRANEXAMIC ACID 1000 MG/10ML IV SOLN
INTRAVENOUS | Status: DC | PRN
  Administered 2020-03-27: 2000 mg via TOPICAL

## 2020-03-27 MED ORDER — ALBUTEROL SULFATE HFA 108 (90 BASE) MCG/ACT IN AERS: 2.0000 | INHALATION_SPRAY | RESPIRATORY_TRACT | Status: DC | PRN

## 2020-03-27 MED ORDER — BUPIVACAINE LIPOSOME 1.3 % IJ SUSP
20.0000 mL | Freq: Once | INTRAMUSCULAR | Status: DC
  Filled 2020-03-27: qty 20

## 2020-03-27 MED ORDER — EPHEDRINE SULFATE-NACL 50-0.9 MG/10ML-% IV SOSY
PREFILLED_SYRINGE | INTRAVENOUS | Status: DC | PRN
  Administered 2020-03-27 (×2): 10 mg via INTRAVENOUS

## 2020-03-27 MED ORDER — POVIDONE-IODINE 10 % EX SWAB
2.0000 "application " | Freq: Once | CUTANEOUS | Status: AC
  Administered 2020-03-27: 2 via TOPICAL

## 2020-03-27 MED ORDER — CLONIDINE HCL (ANALGESIA) 100 MCG/ML EP SOLN
EPIDURAL | Status: DC | PRN
  Administered 2020-03-27: 100 ug

## 2020-03-27 MED ORDER — SODIUM CHLORIDE 0.9 % IR SOLN
Status: DC | PRN
  Administered 2020-03-27: 3000 mL

## 2020-03-27 MED ORDER — POVIDONE-IODINE 10 % EX SWAB: 2.0000 "application " | Freq: Once | CUTANEOUS | Status: DC

## 2020-03-27 MED ORDER — METOPROLOL TARTRATE 5 MG/5ML IV SOLN
INTRAVENOUS | Status: AC
  Filled 2020-03-27: qty 5

## 2020-03-27 MED ORDER — PROPOFOL 10 MG/ML IV BOLUS
INTRAVENOUS | Status: DC | PRN
  Administered 2020-03-27: 40 mg via INTRAVENOUS
  Administered 2020-03-27: 20 mg via INTRAVENOUS
  Administered 2020-03-27: 50 mg via INTRAVENOUS
  Administered 2020-03-27: 20 mg via INTRAVENOUS
  Administered 2020-03-27: 50 mg via INTRAVENOUS
  Administered 2020-03-27: 20 mg via INTRAVENOUS

## 2020-03-27 MED ORDER — METOPROLOL TARTRATE 5 MG/5ML IV SOLN
INTRAVENOUS | Status: DC | PRN
Start: 2020-03-27 — End: 2020-03-27
  Administered 2020-03-27: 1 mg via INTRAVENOUS

## 2020-03-27 MED ORDER — CYPROHEPTADINE HCL 4 MG PO TABS
4.0000 mg | ORAL_TABLET | Freq: Three times a day (TID) | ORAL | Status: DC
  Administered 2020-03-27 – 2020-04-02 (×19): 4 mg via ORAL
  Filled 2020-03-27 (×23): qty 1

## 2020-03-27 MED ORDER — MIDAZOLAM HCL 2 MG/2ML IJ SOLN
INTRAMUSCULAR | Status: AC
  Filled 2020-03-27: qty 2

## 2020-03-27 MED ORDER — FENTANYL CITRATE (PF) 250 MCG/5ML IJ SOLN
INTRAMUSCULAR | Status: DC | PRN
  Administered 2020-03-27: 50 ug via INTRAVENOUS

## 2020-03-27 MED ORDER — BUPIVACAINE IN DEXTROSE 0.75-8.25 % IT SOLN
INTRATHECAL | Status: DC | PRN
  Administered 2020-03-27: 2 mL via INTRATHECAL

## 2020-03-27 MED ORDER — FENTANYL CITRATE (PF) 100 MCG/2ML IJ SOLN: 100.0000 ug | Freq: Once | INTRAMUSCULAR | Status: AC

## 2020-03-27 MED ORDER — BUPIVACAINE LIPOSOME 1.3 % IJ SUSP
INTRAMUSCULAR | Status: DC | PRN
  Administered 2020-03-27: 20 mL

## 2020-03-27 MED ORDER — TRANEXAMIC ACID-NACL 1000-0.7 MG/100ML-% IV SOLN
INTRAVENOUS | Status: AC
  Filled 2020-03-27: qty 100

## 2020-03-27 MED ORDER — POVIDONE-IODINE 7.5 % EX SOLN
Freq: Once | CUTANEOUS | Status: DC
  Filled 2020-03-27 (×2): qty 118

## 2020-03-27 MED ORDER — MORPHINE SULFATE (PF) 4 MG/ML IV SOLN
INTRAVENOUS | Status: AC
  Filled 2020-03-27: qty 2

## 2020-03-27 MED ORDER — BUPIVACAINE HCL (PF) 0.25 % IJ SOLN
INTRAMUSCULAR | Status: AC
  Filled 2020-03-27: qty 30

## 2020-03-27 MED ORDER — ACETAMINOPHEN 500 MG PO TABS
ORAL_TABLET | ORAL | Status: AC
  Administered 2020-03-27: 1000 mg via ORAL
  Filled 2020-03-27: qty 2

## 2020-03-27 MED ORDER — PHENYLEPHRINE HCL-NACL 10-0.9 MG/250ML-% IV SOLN
INTRAVENOUS | Status: DC | PRN
  Administered 2020-03-27: 30 ug/min via INTRAVENOUS

## 2020-03-27 MED ORDER — MENTHOL 3 MG MT LOZG: 1.0000 | LOZENGE | OROMUCOSAL | Status: DC | PRN

## 2020-03-27 MED ORDER — BUPIVACAINE HCL 0.25 % IJ SOLN
INTRAMUSCULAR | Status: DC | PRN
  Administered 2020-03-27: 10 mL
  Administered 2020-03-27: 20 mL

## 2020-03-27 MED ORDER — LIDOCAINE 2% (20 MG/ML) 5 ML SYRINGE
INTRAMUSCULAR | Status: AC
  Filled 2020-03-27: qty 5

## 2020-03-27 MED ORDER — PROPOFOL 500 MG/50ML IV EMUL
INTRAVENOUS | Status: DC | PRN
  Administered 2020-03-27: 75 ug/kg/min via INTRAVENOUS
  Administered 2020-03-27: 100 ug/kg/min via INTRAVENOUS

## 2020-03-27 MED ORDER — ACETAMINOPHEN 500 MG PO TABS: 1000.0000 mg | ORAL_TABLET | Freq: Once | ORAL | Status: AC

## 2020-03-27 MED ORDER — PROPOFOL 10 MG/ML IV BOLUS
INTRAVENOUS | Status: AC
  Filled 2020-03-27: qty 20

## 2020-03-27 MED ORDER — MIDAZOLAM HCL 5 MG/5ML IJ SOLN
INTRAMUSCULAR | Status: DC | PRN
  Administered 2020-03-27: 2 mg via INTRAVENOUS

## 2020-03-27 MED ORDER — LACTATED RINGERS IV SOLN: INTRAVENOUS | Status: DC | PRN

## 2020-03-27 MED ORDER — OXYCODONE HCL 5 MG PO TABS: 5.0000 mg | ORAL_TABLET | Freq: Once | ORAL | Status: DC | PRN

## 2020-03-27 MED ORDER — LACTATED RINGERS IV SOLN: INTRAVENOUS | Status: DC

## 2020-03-27 MED ORDER — OXYCODONE HCL 5 MG PO TABS
5.0000 mg | ORAL_TABLET | ORAL | Status: DC | PRN
  Administered 2020-03-28 – 2020-03-30 (×9): 10 mg via ORAL
  Administered 2020-03-31: 5 mg via ORAL
  Administered 2020-03-31: 10 mg via ORAL
  Administered 2020-03-31: 5 mg via ORAL
  Administered 2020-03-31 – 2020-04-02 (×6): 10 mg via ORAL
  Administered 2020-04-02: 5 mg via ORAL
  Administered 2020-04-02 – 2020-04-03 (×3): 10 mg via ORAL
  Filled 2020-03-27 (×16): qty 2
  Filled 2020-03-27: qty 1
  Filled 2020-03-27 (×5): qty 2
  Filled 2020-03-27: qty 1

## 2020-03-27 MED ORDER — DEXAMETHASONE SODIUM PHOSPHATE 10 MG/ML IJ SOLN
INTRAMUSCULAR | Status: DC | PRN
Start: 2020-03-27 — End: 2020-03-27
  Administered 2020-03-27: 10 mg

## 2020-03-27 MED ORDER — TRANEXAMIC ACID 1000 MG/10ML IV SOLN
2000.0000 mg | Freq: Once | INTRAVENOUS | Status: DC
  Filled 2020-03-27: qty 20

## 2020-03-27 MED ORDER — ASPIRIN 81 MG PO CHEW
81.0000 mg | CHEWABLE_TABLET | Freq: Every day | ORAL | Status: DC
  Administered 2020-03-27 – 2020-04-03 (×8): 81 mg via ORAL
  Filled 2020-03-27 (×8): qty 1

## 2020-03-27 MED ORDER — SODIUM CHLORIDE 0.9% FLUSH
INTRAVENOUS | Status: DC | PRN
  Administered 2020-03-27: 20 mL

## 2020-03-27 MED ORDER — MIDAZOLAM HCL 2 MG/2ML IJ SOLN: 2.0000 mg | Freq: Once | INTRAMUSCULAR | Status: AC

## 2020-03-27 MED ORDER — MIDAZOLAM HCL 2 MG/2ML IJ SOLN
INTRAMUSCULAR | Status: AC
  Administered 2020-03-27: 2 mg via INTRAVENOUS
  Filled 2020-03-27: qty 2

## 2020-03-27 MED ORDER — CLONIDINE HCL (ANALGESIA) 100 MCG/ML EP SOLN
EPIDURAL | Status: AC
  Filled 2020-03-27: qty 10

## 2020-03-27 MED ORDER — BSS IO SOLN
15.0000 mL | Freq: Once | INTRAOCULAR | Status: AC
  Administered 2020-03-27: 15 mL

## 2020-03-27 MED ORDER — VANCOMYCIN HCL IN DEXTROSE 1-5 GM/200ML-% IV SOLN
1000.0000 mg | INTRAVENOUS | Status: AC
  Administered 2020-03-27 (×2): 1000 mg via INTRAVENOUS

## 2020-03-27 MED ORDER — ONDANSETRON HCL 4 MG/2ML IJ SOLN
INTRAMUSCULAR | Status: DC | PRN
  Administered 2020-03-27: 4 mg via INTRAVENOUS

## 2020-03-27 MED ORDER — IRRISEPT - 450ML BOTTLE WITH 0.05% CHG IN STERILE WATER, USP 99.95% OPTIME
TOPICAL | Status: DC | PRN
  Administered 2020-03-27: 450 mL

## 2020-03-27 MED ORDER — FENTANYL CITRATE (PF) 100 MCG/2ML IJ SOLN
INTRAMUSCULAR | Status: AC
  Administered 2020-03-27: 100 ug via INTRAVENOUS
  Filled 2020-03-27: qty 2

## 2020-03-27 MED ORDER — TRANEXAMIC ACID-NACL 1000-0.7 MG/100ML-% IV SOLN
1000.0000 mg | INTRAVENOUS | Status: AC
  Administered 2020-03-27: 1000 mg via INTRAVENOUS

## 2020-03-27 MED ORDER — 0.9 % SODIUM CHLORIDE (POUR BTL) OPTIME
TOPICAL | Status: DC | PRN
  Administered 2020-03-27 (×6): 1000 mL

## 2020-03-27 MED ORDER — HYDROMORPHONE HCL 1 MG/ML IJ SOLN
0.5000 mg | INTRAMUSCULAR | Status: DC | PRN
  Administered 2020-03-27 – 2020-04-01 (×11): 0.5 mg via INTRAVENOUS
  Filled 2020-03-27 (×12): qty 1

## 2020-03-27 MED ORDER — HYDROMORPHONE HCL 1 MG/ML IJ SOLN
INTRAMUSCULAR | Status: AC
  Filled 2020-03-27: qty 1

## 2020-03-27 MED ORDER — ONDANSETRON HCL 4 MG/2ML IJ SOLN: 4.0000 mg | Freq: Once | INTRAMUSCULAR | Status: DC | PRN

## 2020-03-27 MED ORDER — OXYCODONE HCL 5 MG/5ML PO SOLN: 5.0000 mg | Freq: Once | ORAL | Status: DC | PRN

## 2020-03-27 MED ORDER — BSS IO SOLN
INTRAOCULAR | Status: AC
  Filled 2020-03-27: qty 15

## 2020-03-27 MED ORDER — VANCOMYCIN HCL 1000 MG IV SOLR
INTRAVENOUS | Status: AC
  Filled 2020-03-27: qty 1000

## 2020-03-27 MED ORDER — MORPHINE SULFATE (PF) 4 MG/ML IV SOLN
INTRAVENOUS | Status: DC | PRN
  Administered 2020-03-27: 8 mg via INTRAVENOUS

## 2020-03-27 MED ORDER — HYDROMORPHONE HCL 1 MG/ML IJ SOLN
0.2500 mg | INTRAMUSCULAR | Status: DC | PRN
  Administered 2020-03-27 (×2): 0.5 mg via INTRAVENOUS

## 2020-03-27 MED ORDER — ROPIVACAINE HCL 5 MG/ML IJ SOLN
INTRAMUSCULAR | Status: DC | PRN
Start: 2020-03-27 — End: 2020-03-27
  Administered 2020-03-27: 30 mL via PERINEURAL

## 2020-03-27 MED ORDER — KETOROLAC TROMETHAMINE 0.5 % OP SOLN
1.0000 [drp] | Freq: Three times a day (TID) | OPHTHALMIC | Status: DC | PRN
  Administered 2020-03-27: 1 [drp] via OPHTHALMIC

## 2020-03-27 MED ORDER — KETOROLAC TROMETHAMINE 0.5 % OP SOLN
OPHTHALMIC | Status: AC
  Filled 2020-03-27: qty 5

## 2020-03-27 MED ORDER — ALBUTEROL SULFATE (2.5 MG/3ML) 0.083% IN NEBU: 2.5000 mg | INHALATION_SOLUTION | Freq: Four times a day (QID) | RESPIRATORY_TRACT | Status: DC | PRN

## 2020-03-27 MED ORDER — ONDANSETRON HCL 4 MG/2ML IJ SOLN
INTRAMUSCULAR | Status: AC
  Filled 2020-03-27: qty 2

## 2020-03-27 MED ORDER — BUPROPION HCL ER (XL) 150 MG PO TB24
150.0000 mg | ORAL_TABLET | Freq: Every day | ORAL | Status: DC
  Administered 2020-03-27 – 2020-04-02 (×7): 150 mg via ORAL
  Filled 2020-03-27 (×7): qty 1

## 2020-03-27 MED ORDER — FENTANYL CITRATE (PF) 250 MCG/5ML IJ SOLN
INTRAMUSCULAR | Status: AC
  Filled 2020-03-27: qty 5

## 2020-03-27 MED ORDER — PHENOL 1.4 % MT LIQD: 1.0000 | OROMUCOSAL | Status: DC | PRN

## 2020-03-27 MED ORDER — EPHEDRINE 5 MG/ML INJ
INTRAVENOUS | Status: AC
  Filled 2020-03-27: qty 10

## 2020-03-27 SURGICAL SUPPLY — 92 items
BAG DECANTER FOR FLEXI CONT (MISCELLANEOUS) ×2 IMPLANT
BANDAGE ESMARK 6X9 LF (GAUZE/BANDAGES/DRESSINGS) ×1 IMPLANT
BLADE SAG 18X100X1.27 (BLADE) ×2 IMPLANT
BNDG CMPR 9X6 STRL LF SNTH (GAUZE/BANDAGES/DRESSINGS) ×1
BNDG CMPR MED 15X6 ELC VLCR LF (GAUZE/BANDAGES/DRESSINGS) ×1
BNDG COHESIVE 6X5 TAN STRL LF (GAUZE/BANDAGES/DRESSINGS) ×2 IMPLANT
BNDG ELASTIC 6X15 VLCR STRL LF (GAUZE/BANDAGES/DRESSINGS) ×2 IMPLANT
BNDG ESMARK 6X9 LF (GAUZE/BANDAGES/DRESSINGS) ×2
BOWL SMART MIX CTS (DISPOSABLE) IMPLANT
BSPLAT TIB 5D D CMNT STM LT (Knees) ×1 IMPLANT
CEMENT BONE SIMPLEX SPEEDSET (Cement) ×2 IMPLANT
CLSR STERI-STRIP ANTIMIC 1/2X4 (GAUZE/BANDAGES/DRESSINGS) ×1 IMPLANT
CNTNR URN SCR LID CUP LEK RST (MISCELLANEOUS) ×1 IMPLANT
COMP FEM CEMT PERS SZ 7 LT (Joint) ×2 IMPLANT
COMP MED POLY AS PERS S6-7 12 (Joint) ×2 IMPLANT
COMPONENT FEM CMT PERS SZ7 LT (Joint) IMPLANT
COMPONENT MED PLY PERSS6-7 12 (Joint) IMPLANT
CONT SPEC 4OZ STRL OR WHT (MISCELLANEOUS) ×2
COVER SURGICAL LIGHT HANDLE (MISCELLANEOUS) ×2 IMPLANT
COVER WAND RF STERILE (DRAPES) ×2 IMPLANT
CUFF TOURN SGL QUICK 34 (TOURNIQUET CUFF) ×2
CUFF TOURN SGL QUICK 42 (TOURNIQUET CUFF) IMPLANT
CUFF TRNQT CYL 34X4.125X (TOURNIQUET CUFF) ×1 IMPLANT
DECANTER SPIKE VIAL GLASS SM (MISCELLANEOUS) ×2 IMPLANT
DRAPE INCISE IOBAN 66X45 STRL (DRAPES) IMPLANT
DRAPE ORTHO SPLIT 77X108 STRL (DRAPES) ×8
DRAPE SURG ORHT 6 SPLT 77X108 (DRAPES) ×3 IMPLANT
DRAPE U-SHAPE 47X51 STRL (DRAPES) ×2 IMPLANT
DRSG AQUACEL AG ADV 3.5X10 (GAUZE/BANDAGES/DRESSINGS) ×1 IMPLANT
DRSG AQUACEL AG ADV 3.5X14 (GAUZE/BANDAGES/DRESSINGS) IMPLANT
DRSG XEROFORM 1X8 (GAUZE/BANDAGES/DRESSINGS) ×1 IMPLANT
DURAPREP 26ML APPLICATOR (WOUND CARE) ×4 IMPLANT
ELECT CAUTERY BLADE 6.4 (BLADE) ×2 IMPLANT
ELECT REM PT RETURN 9FT ADLT (ELECTROSURGICAL) ×2
ELECTRODE REM PT RTRN 9FT ADLT (ELECTROSURGICAL) ×1 IMPLANT
GAUZE SPONGE 4X4 12PLY STRL (GAUZE/BANDAGES/DRESSINGS) ×2 IMPLANT
GAUZE SPONGE 4X4 12PLY STRL LF (GAUZE/BANDAGES/DRESSINGS) ×1 IMPLANT
GLOVE BIOGEL PI IND STRL 7.0 (GLOVE) ×1 IMPLANT
GLOVE BIOGEL PI IND STRL 8 (GLOVE) ×1 IMPLANT
GLOVE BIOGEL PI INDICATOR 7.0 (GLOVE) ×1
GLOVE BIOGEL PI INDICATOR 8 (GLOVE) ×1
GLOVE ECLIPSE 7.0 STRL STRAW (GLOVE) ×3 IMPLANT
GLOVE ECLIPSE 8.0 STRL XLNG CF (GLOVE) ×3 IMPLANT
GOWN STRL REUS W/ TWL LRG LVL3 (GOWN DISPOSABLE) ×3 IMPLANT
GOWN STRL REUS W/TWL LRG LVL3 (GOWN DISPOSABLE) ×6
HANDPIECE INTERPULSE COAX TIP (DISPOSABLE) ×2
HDLS TROCR DRIL PIN KNEE 75 (PIN) ×2
HOOD PEEL AWAY FLYTE STAYCOOL (MISCELLANEOUS) ×6 IMPLANT
IMMOBILIZER KNEE 20 (SOFTGOODS)
IMMOBILIZER KNEE 20 THIGH 36 (SOFTGOODS) IMPLANT
IMMOBILIZER KNEE 22 UNIV (SOFTGOODS) IMPLANT
IMMOBILIZER KNEE 24 THIGH 36 (MISCELLANEOUS) IMPLANT
IMMOBILIZER KNEE 24 UNIV (MISCELLANEOUS)
KIT BASIN OR (CUSTOM PROCEDURE TRAY) ×2 IMPLANT
KIT TURNOVER KIT B (KITS) ×2 IMPLANT
MANIFOLD NEPTUNE II (INSTRUMENTS) ×2 IMPLANT
NDL 18GX1X1/2 (RX/OR ONLY) (NEEDLE) IMPLANT
NDL SPNL 18GX3.5 QUINCKE PK (NEEDLE) ×1 IMPLANT
NEEDLE 18GX1X1/2 (RX/OR ONLY) (NEEDLE) ×2 IMPLANT
NEEDLE 22X1 1/2 (OR ONLY) (NEEDLE) ×4 IMPLANT
NEEDLE SPNL 18GX3.5 QUINCKE PK (NEEDLE) ×2 IMPLANT
NS IRRIG 1000ML POUR BTL (IV SOLUTION) ×4 IMPLANT
PACK TOTAL JOINT (CUSTOM PROCEDURE TRAY) ×2 IMPLANT
PAD ARMBOARD 7.5X6 YLW CONV (MISCELLANEOUS) ×4 IMPLANT
PAD CAST 4YDX4 CTTN HI CHSV (CAST SUPPLIES) ×1 IMPLANT
PADDING CAST COTTON 4X4 STRL (CAST SUPPLIES) ×2
PADDING CAST COTTON 6X4 STRL (CAST SUPPLIES) ×2 IMPLANT
PIN DRILL HDLS TROCAR 75 4PK (PIN) IMPLANT
SET HNDPC FAN SPRY TIP SCT (DISPOSABLE) ×1 IMPLANT
SPONGE LAP 18X18 X RAY DECT (DISPOSABLE) ×2 IMPLANT
STEM POLY PAT PLY 32M KNEE (Knees) ×1 IMPLANT
STEM TIB ST PERS 14+30 (Stem) ×1 IMPLANT
STEM TIBIA 5 DEG SZ D L KNEE (Knees) IMPLANT
STRIP CLOSURE SKIN 1/2X4 (GAUZE/BANDAGES/DRESSINGS) ×4 IMPLANT
SUCTION FRAZIER HANDLE 10FR (MISCELLANEOUS) ×2
SUCTION TUBE FRAZIER 10FR DISP (MISCELLANEOUS) ×1 IMPLANT
SUT MNCRL AB 3-0 PS2 18 (SUTURE) ×2 IMPLANT
SUT VIC AB 0 CT1 27 (SUTURE) ×6
SUT VIC AB 0 CT1 27XBRD ANBCTR (SUTURE) ×3 IMPLANT
SUT VIC AB 1 CT1 27 (SUTURE) ×14
SUT VIC AB 1 CT1 27XBRD ANBCTR (SUTURE) ×5 IMPLANT
SUT VIC AB 2-0 CT1 27 (SUTURE) ×8
SUT VIC AB 2-0 CT1 TAPERPNT 27 (SUTURE) ×4 IMPLANT
SYR 30ML LL (SYRINGE) ×6 IMPLANT
SYR 3ML LL SCALE MARK (SYRINGE) ×1 IMPLANT
SYR TB 1ML LUER SLIP (SYRINGE) ×2 IMPLANT
TIBIA STEM 5 DEG SZ D L KNEE (Knees) ×2 IMPLANT
TOWEL GREEN STERILE (TOWEL DISPOSABLE) ×4 IMPLANT
TOWEL GREEN STERILE FF (TOWEL DISPOSABLE) ×4 IMPLANT
TRAY CATH 16FR W/PLASTIC CATH (SET/KITS/TRAYS/PACK) IMPLANT
WATER STERILE IRR 1000ML POUR (IV SOLUTION) IMPLANT
YANKAUER SUCT BULB TIP NO VENT (SUCTIONS) ×1 IMPLANT

## 2020-03-27 NOTE — Anesthesia Postprocedure Evaluation (Deleted)
Anesthesia Post Note  Patient: [name]  Procedure(s) Performed: LEFT TOTAL KNEE ARTHROPLASTY (Left Knee)     Anesthesia Post Evaluation No complications documented.  Last Vitals:  Vitals:   03/27/20 1025 03/27/20 1040  BP: (!) 131/94 (!) 183/90  Pulse: 68 68  Resp: 17 14  Temp:    SpO2: 100% 100%    Last Pain:  Vitals:   03/27/20 1003  TempSrc:   PainSc: 3                  Lannie Fields

## 2020-03-27 NOTE — Transfer of Care (Signed)
Immediate Anesthesia Transfer of Care Note  Patient: [name]  Procedure(s) Performed: LEFT TOTAL KNEE ARTHROPLASTY (Left Knee)  Patient Location: PACU  Anesthesia Type:MAC combined with regional for post-op pain  Level of Consciousness: drowsy  Airway & Oxygen Therapy: Patient Spontanous Breathing and Patient connected to nasal cannula oxygen  Post-op Assessment: Report given to RN and Post -op Vital signs reviewed and stable  Post vital signs: Reviewed and stable  Last Vitals:  Vitals Value Taken Time  BP 119/66 03/27/20 1515  Temp 36.4 C 03/27/20 1515  Pulse 63 03/27/20 1515  Resp 13 03/27/20 1515  SpO2 100 % 03/27/20 1515    Last Pain:  Vitals:   03/27/20 1515  TempSrc:   PainSc: 4       Patients Stated Pain Goal: 5 (25/05/39 7673)  Complications: No complications documented.

## 2020-03-27 NOTE — Progress Notes (Signed)
Orthopedic Tech Progress Note Patient Details:  [name] February 23, 1951 [id number]  Ortho Devices Type of Ortho Device: Bone foam zero knee Ortho Device/Splint Interventions: Ordered, Application   Post Interventions Patient Tolerated: Well Instructions Provided: Care of device   Donald Pore 03/27/2020, 4:18 PM

## 2020-03-27 NOTE — Progress Notes (Signed)
1700 Received pt from PACU, A&O x4. LLE with ace wrap dry and intact. On CPM at 10-40 deg, tolerating. Denies pain at this time.

## 2020-03-27 NOTE — Brief Op Note (Signed)
   03/27/2020  3:19 PM  PATIENT:  Butch Penny Hosking  69 y.o. female  PRE-OPERATIVE DIAGNOSIS:  left knee osteoarthritis    POST-OPERATIVE DIAGNOSIS:  left knee osteoarthritis  PROCEDURE:  Procedure(s): LEFT TOTAL KNEE ARTHROPLASTY  SURGEON:  Surgeon(s): Cammy Copa, MD  ASSISTANT: magnant pa  ANESTHESIA:   spinal  EBL: 75 ml    Total I/O In: 2060 [I.V.:1800; IV Piggyback:260] Out: 275 [Urine:200; Blood:75]  BLOOD ADMINISTERED: none  DRAINS: none   LOCAL MEDICATIONS USED:  Marcaine mso4 clonidine  SPECIMEN:  No Specimen  COUNTS:  YES  TOURNIQUET:   Total Tourniquet Time Documented: Thigh (Left) - 120 minutes Total: Thigh (Left) - 120 minutes   DICTATION: .Other Dictation: Dictation Number [id number]  PLAN OF CARE: Admit to inpatient   PATIENT DISPOSITION:  PACU - hemodynamically stable

## 2020-03-27 NOTE — H&P (Signed)
TOTAL KNEE ADMISSION H&P  Patient is being admitted for left total knee arthroplasty.  Subjective:  Chief Complaint:left knee pain.  HPI: [name], 69 y.o. female, has a history of pain and functional disability in the left knee due to arthritis and has failed non-surgical conservative treatments for greater than 12 weeks to includeNSAID's and/or analgesics, corticosteriod injections, viscosupplementation injections, flexibility and strengthening excercises, use of assistive devices and activity modification.  Onset of symptoms was gradual, starting 10 years ago with rapidlly worsening course since that time. The patient noted no past surgery on the left knee(s).  Patient currently rates pain in the left knee(s) at 10 out of 10 with activity. Patient has night pain, worsening of pain with activity and weight bearing, pain that interferes with activities of daily living, pain with passive range of motion, crepitus and joint swelling.  Patient has evidence of subchondral cysts, subchondral sclerosis and joint space narrowing by imaging studies. This patient has had Long history of pain disability and dysfunction of the left knee.  She has little social support network and will likely required skilled nursing placement after surgery.. There is no active infection.  Patient Active Problem List   Diagnosis Date Noted  . Humeral surgical neck fracture 02/27/2012  . 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/27/2012  . Pulmonary nodule 02/27/2012  . Drug-seeking behavior 02/25/2011  . ROTATOR CUFF REPAIR, RIGHT, HX OF 03/27/2010  . HYPERLIPIDEMIA 02/21/2010  . NECK PAIN, CHRONIC 11/17/2007  . DEPRESSION 11/11/2007  . OSTEOPOROSIS 11/11/2007   Past Medical History:  Diagnosis Date  . Anxiety   . Arthritis    back, joints  . Asthma   . Chronic pain   . Constipation   . Depression   . Dysrhythmia    Palpitations  . GERD (gastroesophageal reflux disease)   . High cholesterol   . Hypertension     resolved whn she stopped drinking alcohol  . Mitral prolapse    dx as a child  . Osteopenia   . Osteoporosis     Past Surgical History:  Procedure Laterality Date  . ABDOMINAL HYSTERECTOMY  1985  . BACK SURGERY    . BREAST SURGERY     augmentation  . HARDWARE REMOVAL  02/27/2012   Procedure: HARDWARE REMOVAL;  Surgeon: Kerrin Champagne, MD;  Location: [facility] OR;  Service: Orthopedics;;  exploration and removal of portion of screw at T-!  . JOINT REPLACEMENT Right    shoulder  . left hip replacement  02-27-12   just screw - no replacement  . NECK SURGERY     plates and screws  . ORIF WRIST FRACTURE  1986   right  . right femur replacement    . ROTATOR CUFF REPAIR     right  . SHOULDER HEMI-ARTHROPLASTY  02/27/2012   Procedure: SHOULDER HEMI-ARTHROPLASTY;  Surgeon: Kerrin Champagne, MD;  Location: [facility] OR;  Service: Orthopedics;  Laterality: Right;  Right shoulder hemiarthroplasty, repair right rotator cuff  . TONSILLECTOMY    . WRIST SURGERY     tendon repair from a dog bite- left    Current Facility-Administered Medications  Medication Dose Route Frequency Provider Last Rate Last Admin  . acetaminophen (TYLENOL) 500 MG tablet           . acetaminophen (TYLENOL) tablet 1,000 mg  1,000 mg Oral Once Finucane, Elizabeth M, DO      . chlorhexidine (PERIDEX) 0.12 % solution           . fentaNYL (  SUBLIMAZE) 100 MCG/2ML injection           . midazolam (VERSED) 2 MG/2ML injection           . povidone-iodine (BETADINE) 7.5 % scrub   Topical Once Magnant, Charles L, PA-C      . povidone-iodine 10 % swab 2 application  2 application Topical Once Magnant, Charles L, PA-C      . povidone-iodine 10 % swab 2 application  2 application Topical Once Magnant, Charles L, PA-C      . tranexamic acid (CYKLOKAPRON) 1000MG /134mL IVPB           . tranexamic acid (CYKLOKAPRON) IVPB 1,000 mg  1,000 mg Intravenous To OR Magnant, Charles L, PA-C      . vancomycin (VANCOCIN) IVPB 1000 mg/200 mL premix  1,000 mg  Intravenous On Call to OR Magnant, 80m, PA-C       Allergies  Allergen Reactions  . Penicillins Rash    Childhood     Social History   Tobacco Use  . Smoking status: Former Smoker    Packs/day: 0.75    Years: 50.00    Pack years: 87.50    Quit date: 11/14/2011    Years since quitting: 8.3  . Smokeless tobacco: Never Used  Substance Use Topics  . Alcohol use: Yes    Comment: ocasionally    No family history on file.   Review of Systems  Musculoskeletal: Positive for arthralgias.  All other systems reviewed and are negative.   Objective:  Physical Exam Vitals reviewed.  HENT:     Head: Normocephalic.     Nose: Nose normal.     Mouth/Throat:     Mouth: Mucous membranes are moist.  Eyes:     Pupils: Pupils are equal, round, and reactive to light.  Cardiovascular:     Rate and Rhythm: Normal rate.     Pulses: Normal pulses.  Pulmonary:     Effort: Pulmonary effort is normal.     Breath sounds: No wheezing.  Abdominal:     General: Abdomen is flat.  Musculoskeletal:     Cervical back: Normal range of motion.  Skin:    General: Skin is warm.  Neurological:     General: No focal deficit present.     Mental Status: She is alert.  Psychiatric:        Mood and Affect: Mood normal.   Examination of the left knee demonstrates valgus alignment.  Pedal pulses palpable.  Patient has about a 5 degree flexion contracture but can bend to approximately 110 115.  She has valgus deformity which is partially correctable with varus stress.  Skin is intact in the knee region.  She has 2 superficial skin abrasions 3 handbreadth above the patella which are not in the operative field.  Ankle dorsiflexion intact.  Vital signs in last 24 hours: Weight:  [54.4 kg] 54.4 kg (07/12 1512)  Labs:   Estimated body mass index is 18.52 kg/m as calculated from the following:   Height as of 03/26/20: 5' 7.5" (1.715 m).   Weight as of 03/26/20: 54.4 kg.   Imaging Review Plain  radiographs demonstrate severe degenerative joint disease of the left knee(s). The overall alignment issignificant valgus. The bone quality appears to be fair for age and reported activity level.      Assessment/Plan:  End stage arthritis, left knee   The patient history, physical examination, clinical judgment of the provider and imaging studies are consistent with end  stage degenerative joint disease of the left knee(s) and total knee arthroplasty is deemed medically necessary. The treatment options including medical management, injection therapy arthroscopy and arthroplasty were discussed at length. The risks and benefits of total knee arthroplasty were presented and reviewed. The risks due to aseptic loosening, infection, stiffness, patella tracking problems, thromboembolic complications and other imponderables were discussed. The patient acknowledged the explanation, agreed to proceed with the plan and consent was signed. Patient is being admitted for inpatient treatment for surgery, pain control, PT, OT, prophylactic antibiotics, VTE prophylaxis, progressive ambulation and ADL's and discharge planning. The patient is planning to be discharged to skilled nursing facility the patient's preop valgus alignment will require soft tissue releases in order to achieve flexion and extension gap balancing.  This will add to the complexity and difficulty of the procedure as well as the risk.  All this is discussed with the patient.  All questions answered.  Do not anticipate having to do peroneal nerve neurolysis at this time.    Anticipated LOS equal to or greater than 2 midnights due to - Age 22 and older with one or more of the following:  - Obesity  - Expected need for hospital services (PT, OT, Nursing) required for safe  discharge  - Anticipated need for postoperative skilled nursing care or inpatient rehab  - Active co-morbidities: None OR   - Unanticipated findings during/Post Surgery: None   - Patient is a high risk of re-admission due to: Barriers to post-acute care (logistical, no family support in home)

## 2020-03-27 NOTE — Progress Notes (Signed)
Pt unable to provide urine for UA and culture. Spoke with Harriette Bouillon, PA (Dr. Diamantina Providence PA) and he said if we were unable to get urine, it will not delay the surgery.

## 2020-03-27 NOTE — Anesthesia Procedure Notes (Signed)
Spinal  Patient location during procedure: OR Start time: 03/27/2020 10:58 AM End time: 03/27/2020 11:07 AM Staffing Performed: anesthesiologist  Anesthesiologist: Lannie Fields, DO Preanesthetic Checklist Completed: patient identified, IV checked, risks and benefits discussed, surgical consent, monitors and equipment checked, pre-op evaluation and timeout performed Spinal Block Patient position: sitting Prep: DuraPrep and site prepped and draped Patient monitoring: cardiac monitor, continuous pulse ox and blood pressure Approach: midline Location: L3-4 Injection technique: single-shot Needle Needle type: Pencan  Needle gauge: 24 G Needle length: 9 cm Assessment Sensory level: T6 Additional Notes Functioning IV was confirmed and monitors were applied. Sterile prep and drape, including hand hygiene and sterile gloves were used. The patient was positioned and the spine was prepped. The skin was anesthetized with lidocaine.  Free flow of clear CSF was obtained prior to injecting local anesthetic into the CSF.  The spinal needle aspirated freely following injection.  The needle was carefully withdrawn.  The patient tolerated the procedure well.

## 2020-03-27 NOTE — Anesthesia Procedure Notes (Signed)
Anesthesia Regional Block: Adductor canal block   Pre-Anesthetic Checklist: ,, timeout performed, Correct Patient, Correct Site, Correct Laterality, Correct Procedure, Correct Position, site marked, Risks and benefits discussed,  Surgical consent,  Pre-op evaluation,  At surgeon's request and post-op pain management  Laterality: Left  Prep: Maximum Sterile Barrier Precautions used, chloraprep       Needles:  Injection technique: Single-shot  Needle Type: Echogenic Stimulator Needle     Needle Length: 9cm  Needle Gauge: 22     Additional Needles:   Procedures:,,,, ultrasound used (permanent image in chart),,,,  Narrative:  Start time: 03/27/2020 10:40 AM End time: 03/27/2020 10:45 AM Injection made incrementally with aspirations every 5 mL.  Performed by: Personally  Anesthesiologist: Lannie Fields, DO  Additional Notes: Monitors applied. No increased pain on injection. No increased resistance to injection. Injection made in 5cc increments. Good needle visualization. Patient tolerated procedure well.

## 2020-03-27 NOTE — Anesthesia Procedure Notes (Signed)
Procedure Name: MAC Date/Time: 03/27/2020 11:00 AM Performed by: Michele Rockers, CRNA Pre-anesthesia Checklist: Patient identified, Emergency Drugs available, Suction available, Timeout performed and Patient being monitored Patient Re-evaluated:Patient Re-evaluated prior to induction Oxygen Delivery Method: Simple face mask

## 2020-03-28 MED ORDER — MUPIROCIN 2 % EX OINT
1.0000 "application " | TOPICAL_OINTMENT | Freq: Two times a day (BID) | CUTANEOUS | Status: AC
  Administered 2020-03-28 – 2020-04-01 (×10): 1 via NASAL
  Filled 2020-03-28 (×3): qty 22

## 2020-03-28 MED ORDER — METHOCARBAMOL 500 MG PO TABS
500.0000 mg | ORAL_TABLET | Freq: Four times a day (QID) | ORAL | Status: DC | PRN
  Administered 2020-03-28 – 2020-04-03 (×19): 500 mg via ORAL
  Filled 2020-03-28 (×20): qty 1

## 2020-03-28 MED ORDER — ACETAMINOPHEN 500 MG PO TABS
1000.0000 mg | ORAL_TABLET | Freq: Four times a day (QID) | ORAL | Status: AC
  Administered 2020-03-28 – 2020-03-29 (×4): 1000 mg via ORAL
  Filled 2020-03-28 (×4): qty 2

## 2020-03-28 MED ORDER — NICOTINE 7 MG/24HR TD PT24
7.0000 mg | MEDICATED_PATCH | Freq: Every day | TRANSDERMAL | Status: DC
  Administered 2020-03-29 – 2020-04-02 (×5): 7 mg via TRANSDERMAL
  Filled 2020-03-28 (×6): qty 1

## 2020-03-28 MED ORDER — ONDANSETRON HCL 4 MG PO TABS
4.0000 mg | ORAL_TABLET | Freq: Four times a day (QID) | ORAL | Status: DC | PRN
  Administered 2020-03-28: 4 mg via ORAL
  Filled 2020-03-28: qty 1

## 2020-03-28 MED ORDER — VANCOMYCIN HCL IN DEXTROSE 1-5 GM/200ML-% IV SOLN
1000.0000 mg | Freq: Two times a day (BID) | INTRAVENOUS | Status: AC
  Administered 2020-03-28: 1000 mg via INTRAVENOUS
  Filled 2020-03-28 (×2): qty 200

## 2020-03-28 MED ORDER — ONDANSETRON HCL 4 MG/2ML IJ SOLN: 4.0000 mg | Freq: Four times a day (QID) | INTRAMUSCULAR | Status: DC | PRN

## 2020-03-28 MED ORDER — METHOCARBAMOL 1000 MG/10ML IJ SOLN
500.0000 mg | Freq: Four times a day (QID) | INTRAVENOUS | Status: DC | PRN
  Filled 2020-03-28: qty 5

## 2020-03-28 MED ORDER — METOCLOPRAMIDE HCL 5 MG/ML IJ SOLN: 5.0000 mg | Freq: Three times a day (TID) | INTRAMUSCULAR | Status: DC | PRN

## 2020-03-28 MED ORDER — METHOCARBAMOL 500 MG PO TABS: 500.0000 mg | ORAL_TABLET | Freq: Four times a day (QID) | ORAL | Status: DC | PRN

## 2020-03-28 MED ORDER — SODIUM CHLORIDE 0.9% FLUSH: 10.0000 mL | INTRAVENOUS | Status: DC | PRN

## 2020-03-28 MED ORDER — CHLORHEXIDINE GLUCONATE CLOTH 2 % EX PADS
6.0000 | MEDICATED_PAD | Freq: Every day | CUTANEOUS | Status: AC
  Administered 2020-03-29 – 2020-03-31 (×3): 6 via TOPICAL

## 2020-03-28 MED ORDER — METOCLOPRAMIDE HCL 5 MG PO TABS: 5.0000 mg | ORAL_TABLET | Freq: Three times a day (TID) | ORAL | Status: DC | PRN

## 2020-03-28 NOTE — Plan of Care (Signed)

## 2020-03-28 NOTE — Progress Notes (Signed)
Orthopedic Tech Progress Note Patient Details:  [name] 26-Jun-1951 [id number]  Patient ID: [name], female   DOB: [birth year], 69 y.o.   MRN: [id number] Pt already in cpm  Trinna Post 03/28/2020, 6:15 AM

## 2020-03-28 NOTE — [place] FL2 (Signed)
[place] MEDICAID FL2 LEVEL OF CARE SCREENING TOOL     IDENTIFICATION  Patient Name: LILE MCCURLEY Birthdate: 1951/04/28 Sex: female Admission Date (Current Location): 03/27/2020  [place] and [place] Number:  [facility] [id number] S Facility and Address:  [facility] [place], [address], [place], [place] [address]      Provider Number:    Attending Physician Name and Address:  Cammy Copa, MD  Relative Name and Phone Number:  Chestine Spore - friend - [phone]    Current Level of Care: Hospital Recommended Level of Care: Skilled Nursing Facility Prior Approval Number:    Date Approved/Denied:   PASRR Number: [id number]  Discharge Plan: SNF    Current Diagnoses: Patient Active Problem List   Diagnosis Date Noted   Arthritis of left knee 03/27/2020   Humeral surgical neck fracture 02/27/2012   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/27/2012   Pulmonary nodule 02/27/2012   Drug-seeking behavior 02/25/2011   ROTATOR CUFF REPAIR, RIGHT, HX OF 03/27/2010   HYPERLIPIDEMIA 02/21/2010   NECK PAIN, CHRONIC 11/17/2007   DEPRESSION 11/11/2007   OSTEOPOROSIS 11/11/2007    Orientation RESPIRATION BLADDER Height & Weight     Self, Time, Situation, Place  Normal Continent Weight: 54.4 kg Height:  5' 7.5" (171.5 cm)  BEHAVIORAL SYMPTOMS/MOOD NEUROLOGICAL BOWEL NUTRITION STATUS      Continent Diet (See Discharge Summary)  AMBULATORY STATUS COMMUNICATION OF NEEDS Skin   Limited Assist Verbally Surgical wounds                       Personal Care Assistance Level of Assistance  Bathing, Dressing Bathing Assistance: Limited assistance   Dressing Assistance: Limited assistance     Functional Limitations Info  Sight, Hearing, Speech Sight Info: Adequate Hearing Info: Adequate Speech Info: Adequate    SPECIAL CARE FACTORS FREQUENCY  PT (By licensed PT), OT (By licensed OT)     PT Frequency: 5 x per week OT Frequency: 5 x per week             Contractures Contractures Info: Not present    Additional Factors Info  Allergies, Code Status, Psychotropic Code Status Info: Full code Allergies Info: penicillin Psychotropic Info: Wellbutrin         Current Medications (03/28/2020):  This is the current hospital active medication list Current Facility-Administered Medications  Medication Dose Route Frequency Provider Last Rate Last Admin   acetaminophen (TYLENOL) tablet 1,000 mg  1,000 mg Oral Q6H Magnant, Charles L, PA-C   1,000 mg at 03/28/20 1136   albuterol (PROVENTIL) (2.5 MG/3ML) 0.083% nebulizer solution 2.5 mg  2.5 mg Nebulization Q6H PRN Cammy Copa, MD       aspirin chewable tablet 81 mg  81 mg Oral Daily Magnant, Charles L, PA-C   81 mg at 03/28/20 0832   bupivacaine liposome (EXPAREL) 1.3 % injection 266 mg  20 mL Infiltration Once Cammy Copa, MD       buPROPion (WELLBUTRIN XL) 24 hr tablet 150 mg  150 mg Oral QHS Magnant, Charles L, PA-C   150 mg at 03/27/20 2158   Chlorhexidine Gluconate Cloth 2 % PADS 6 each  6 each Topical Daily Cammy Copa, MD       cyproheptadine (PERIACTIN) 4 MG tablet 4 mg  4 mg Oral TID Magnant, Charles L, PA-C   4 mg at 03/28/20 5400   docusate sodium (COLACE) capsule 100 mg  100 mg Oral BID Magnant, Joycie Peek,  PA-C   100 mg at 03/28/20 1607   HYDROmorphone (DILAUDID) injection 0.5 mg  0.5 mg Intravenous Q4H PRN Magnant, Charles L, PA-C   0.5 mg at 03/28/20 3710   lactated ringers infusion   Intravenous Continuous Lannie Fields, DO 10 mL/hr at 03/27/20 1035 New Bag at 03/27/20 1035   lactated ringers infusion   Intravenous Continuous Magnant, Charles L, PA-C   Stopped at 03/28/20 1140   menthol-cetylpyridinium (CEPACOL) lozenge 3 mg  1 lozenge Oral PRN Magnant, Charles L, PA-C       Or   phenol (CHLORASEPTIC) mouth spray 1 spray  1 spray Mouth/Throat PRN Magnant, Charles L, PA-C       methocarbamol (ROBAXIN) tablet 500 mg  500 mg Oral  Q6H PRN Magnant, Charles L, PA-C   500 mg at 03/28/20 1136   Or   methocarbamol (ROBAXIN) 500 mg in dextrose 5 % 50 mL IVPB  500 mg Intravenous Q6H PRN Magnant, Charles L, PA-C       metoCLOPramide (REGLAN) tablet 5-10 mg  5-10 mg Oral Q8H PRN Magnant, Charles L, PA-C       Or   metoCLOPramide (REGLAN) injection 5-10 mg  5-10 mg Intravenous Q8H PRN Magnant, Charles L, PA-C       mupirocin ointment (BACTROBAN) 2 % 1 application  1 application Nasal BID Cammy Copa, MD   1 application at 03/28/20 1229   ondansetron (ZOFRAN) tablet 4 mg  4 mg Oral Q6H PRN Magnant, Charles L, PA-C   4 mg at 03/28/20 1342   Or   ondansetron (ZOFRAN) injection 4 mg  4 mg Intravenous Q6H PRN Magnant, Charles L, PA-C       oxyCODONE (Oxy IR/ROXICODONE) immediate release tablet 5-10 mg  5-10 mg Oral Q4H PRN Magnant, Charles L, PA-C   10 mg at 03/28/20 1229   tranexamic acid (CYKLOKAPRON) 2,000 mg in sodium chloride 0.9 % 50 mL Topical Application  2,000 mg Topical Once Cammy Copa, MD         Discharge Medications: Please see discharge summary for a list of discharge medications.  Relevant Imaging Results:  Relevant Lab Results:   Additional Information SS# [id number]  Janae Bridgeman, RN

## 2020-03-28 NOTE — Op Note (Signed)
NAME: [name], [name] MEDICAL RECORD BP:[id number] ACCOUNT [id number] DATE OF BIRTH:1951/06/05 FACILITY: [facility] LOCATION: [facility] PHYSICIAN:[name] Annette Providence, MD  OPERATIVE REPORT  DATE OF PROCEDURE:  03/27/2020  PREOPERATIVE DIAGNOSIS:  Left knee arthritis, valgus alignment.  POSTOPERATIVE DIAGNOSIS:  Left knee arthritis, valgus alignment.  PROCEDURE:  Left total knee replacement using Persona personalized knee system, cemented cruciate retaining narrow size 7 femur with natural tibia cemented 5 degree stemmed size D tibia with tapered stem extender +30 mm in length with all-poly patella  cemented 32 mm in diameter, 8.5 mm in thickness, and size 12 mm highly cross-linked polyethylene medial congruent insert.  SURGEON:  Annette Copa, MD  ASSISTANT:  Annette Cai, PA  INDICATIONS:  [name] is a 69 year old patient with left knee pain.  She has valgus alignment and end-stage arthritis refractory to nonoperative management.  She presents now for operative management after explanation of risks and benefits.  PROCEDURE IN DETAIL:  The patient was brought to the operating room where spinal anesthetic was induced.  Preoperative antibiotics were administered.  Timeout was called.  Left leg was prescrubbed with alcohol and Betadine, allowed to air dry.  Prepped  with DuraPrep solution and draped in sterile manner.  Left leg in valgus deformity.  Ioban used to cover the operative field.  The leg was elevated, exsanguinated with the Esmarch wrap.  Tourniquet was inflated.  Skin and subcutaneous tissue were sharply  divided anteriorly.  Anterior approach to knee was made.  Median parapatellar arthrotomy was made and marked with #1 Vicryl suture.  Patella was everted.  Lateral patellofemoral ligament was released, but in general, the patient had a massive contracted  tissue on that lateral side.  Immediate lateral release was performed.  Fat pad was partially excised.  Medial soft tissue sleeve was  not elevated off of the tibia, but only the osteotome was used to go around the tibial plateau in order to minimize any  medial soft tissue dissection.  Soft tissue removed from the anterior distal femur.  ACL was released.  Anterior horn of the lateral meniscus released.  Menisci were excised.  Intramedullary alignment was then used to make a tibial cut, which was first  made a measured cut of 4 mm off of the most affected lateral tibial plateau.  This was later revised up to a 10 mm resection.  PCL was recessed.  Intramedullary alignment was then used to cut the femur in 3 degrees of valgus.  Initially, an 8 mm cut was  made and then that was later revised to both a 10 and a 12 mm cut.  Bone quality on the medial femoral condyle was poor and this complicated the procedure because of the pins being unable to obtain good enough purchase to secure the cutting blocks.   Nonetheless, after the distal femoral and distal tibial cuts were made and revised we were able to seat a 10 mm spacer with slight hyperextension.  Attention was then directed towards the femoral cut.  Using the cut tibial plateau as a guide, a symmetric  flexion gap was achieved.  This was later revised to slightly more external rotation in order to improve patellar tracking after the initial anterior cut was made.  Still maintained a generally symmetric flexion gap.  The anterior, posterior and chamfer  cuts were then made.  It should be noted that prior to obtaining symmetric extension gap the posterolateral corner structures were carefully released off the posterolateral tibial plateau.  The iliotibial band attachment  on Gerdy's tubercle was also  released with a Cobb elevator.  Subperiosteal elevation was then used to elevate and partially release the popliteus and lateral collateral ligament.  These releases allowed [place] to achieve a symmetric extension gap.  The gastroc fascia was not required to  be released.  The PCL was recessed.  With  the correct rotation was achieved on the femur to essentially neutral to 1-2 degrees of external rotation to essentially balance the flexion and extension gaps, the trial components were placed.  The patient had  good patellar tracking and full extension and full flexion with minimal liftoff.  Trial components were removed.  Tibia keel punch was performed.  Next, a thorough irrigation was performed.  Tourniquet was released at this time because 2 hours had  elapsed.  Fifteen minutes was allowed to transpire.  The 3 liters of irrigating solution was used to irrigate the joint surface and the joint.  IrriSept solution was used after the incision, after the arthrotomy, and at multiple times during the case.   Tourniquet was inflated again after 15 minutes of being down and then that was only up for 15 more minutes.  The true components were cemented into position with excess cement removed.  A 12 spacer gave optimal stability as well as optimal range of  motion.  The knee was stable to varus and valgus stress at 0, 30 and 90 degrees.  Overall, the alignment looked anatomic.  Patella tracked beautifully with no thumbs technique after the lateral release.  Tourniquet was released again, thorough irrigation  performed.  Arthrotomy was then closed after irrigating with IrriSept solution and placing vancomycin powder in.  Arthrotomy was closed using #1 Vicryl suture followed by interrupted inverted 0 Vicryl suture, more IrriSept solution, vancomycin powder,  followed by 2-0 Vicryl and 3-0 Monocryl.  Solution of Marcaine, morphine, clonidine injected into the knee for postop pain relief.  This was about 7 mL of a 10 mL solution.  Steri-Strips and Aquacel dressing applied.  The patient tolerated the procedure  well without immediate complication.  Luke's assistance was required for opening and closing, retraction, tissue mobilization.  His assistance was a medical necessity.  CN/NUANCE  D:03/27/2020 T:03/28/2020  JOB:[id number]

## 2020-03-28 NOTE — Progress Notes (Signed)
Physical Therapy Treatment Patient Details Name: [name] MRN: [id number] DOB: 10-07-1950 Today's Date: 03/28/2020    History of Present Illness Pt is a 69 y.o. F with significant PMH of back surgery, R rotator cuff repair, osteoporosis, left hip replacement, right knee surgery who presents with left knee osteoarthritis s/p left total knee arthroplasty 03/27/2020.    PT Comments    Pt agreeable to physical therapy session with encouragement. Making slow progress towards her physical therapy goals, continues with poor pain control. Ambulating 25 feet with a walker at a min assist level. Decreased left quad activation noted with knee in flexed position. Given lack of caregiver support upon discharge home, updated d/c plan to SNF. Pt agreeable.    Follow Up Recommendations  SNF     Equipment Recommendations  3in1 (PT)    Recommendations for Other Services OT consult     Precautions / Restrictions Precautions Precautions: Fall Restrictions Weight Bearing Restrictions: No    Mobility  Bed Mobility Overal bed mobility: Needs Assistance Bed Mobility: Supine to Sit;Sit to Supine     Supine to sit: Min guard Sit to supine: Min guard      Transfers Overall transfer level: Needs assistance Equipment used: Rolling walker (2 wheeled) Transfers: Sit to/from [employer] Sit to Stand: Min assist         General transfer comment: MinA to rise to stand from edge of bed, effortful  Ambulation/Gait Ambulation/Gait assistance: Min assist Gait Distance (Feet): 25 Feet Assistive device: Rolling walker (2 wheeled) Gait Pattern/deviations: Step-to pattern;Trunk flexed;Antalgic Gait velocity: decreased   General Gait Details: Cues for sequencing, walker proximity, left quad activation   Stairs             Wheelchair Mobility    Modified Rankin (Stroke Patients Only)       Balance Overall balance assessment: Needs assistance Sitting-balance  support: Feet supported Sitting balance-Leahy Scale: Good     Standing balance support: Bilateral upper extremity supported Standing balance-Leahy Scale: Poor Standing balance comment: reliant on external support                            Cognition Arousal/Alertness: Awake/alert Behavior During Therapy: WFL for tasks assessed/performed Overall Cognitive Status: No family/caregiver present to determine baseline cognitive functioning                                 General Comments: Pt impulsive, emotionally labile throughout session, distracted by pain      Exercises Total Joint Exercises Quad Sets: Both;15 reps;Seated Towel Squeeze: Both;10 reps;Seated Heel Slides: Left;5 reps;Seated;AAROM Hip ABduction/ADduction: Left;10 reps;Supine Long Arc Quad: Left;10 reps;Seated    General Comments        Pertinent Vitals/Pain Pain Assessment: Faces Faces Pain Scale: Hurts even more Pain Location: left knee with movement Pain Descriptors / Indicators: Guarding;Grimacing Pain Intervention(s): Limited activity within patient's tolerance;Monitored during session    Home Living                      Prior Function            PT Goals (current goals can now be found in the care plan section) Acute Rehab PT Goals Patient Stated Goal: less pain, no knee deformity PT Goal Formulation: With patient Time For Goal Achievement: 04/11/20 Potential to Achieve Goals: Good Progress towards PT goals: Progressing  toward goals    Frequency    7X/week      PT Plan Discharge plan needs to be updated    Co-evaluation              AM-PAC PT "6 Clicks" Mobility   Outcome Measure  Help needed turning from your back to your side while in a flat bed without using bedrails?: None Help needed moving from lying on your back to sitting on the side of a flat bed without using bedrails?: A Little Help needed moving to and from a bed to a chair  (including a wheelchair)?: A Little Help needed standing up from a chair using your arms (e.g., wheelchair or bedside chair)?: A Little Help needed to walk in hospital room?: A Little Help needed climbing 3-5 steps with a railing? : A Lot 6 Click Score: 18    End of Session   Activity Tolerance: Patient limited by pain Patient left: with call bell/phone within reach;in bed Nurse Communication: Mobility status PT Visit Diagnosis: Pain;Difficulty in walking, not elsewhere classified (R26.2);Other abnormalities of gait and mobility (R26.89) Pain - Right/Left: Left Pain - part of body: Knee     Time: 4235-3614 PT Time Calculation (min) (ACUTE ONLY): 15 min  Charges:  $Gait Training: 8-22 mins                       [name], PT, DPT Acute Rehabilitation Services Pager [phone] Office [phone]    [name] 03/28/2020, 5:16 PM

## 2020-03-28 NOTE — Progress Notes (Signed)
Pt stable - pain ok Requests nicotine patch Ready for dc to snf tomorrow

## 2020-03-28 NOTE — TOC Initial Note (Signed)
Transition of Care [facility]) - Initial/Assessment Note    Patient Details  Name: [name] MRN: [id number] Date of Birth: 08/25/1951  [facility] CM/SW Contact:    Curlene Labrum, RN Phone Number: 03/28/2020, 3:37 PM  Clinical Narrative:                 Case management met with the patient regarding S/P Left total knee arthroplasty.  Patient states that she lives alone with no family in the area other than a friend, Claiborne Billings, that lives in [place].  Patient requests SNF placement since she lives alone and she is unable to care for herself after surgery.  Patient last stayed in a facility in [place] for hip surgery in the past.  Patient was given Medicare [place] form for observation status.  She was given Medicare choice regarding SNF placement - will present her with bed offers once SNF workup is done regarding facilities that are in network with her insurance provider.  Will continue to follow for SNF workup.  Expected Discharge Plan: Skilled Nursing Facility Barriers to Discharge: Unsafe home situation (lives alone)   Patient Goals and CMS Choice Patient states their goals for this hospitalization and ongoing recovery are:: Patient requests to go to SNF Rehab CMS Medicare.gov Compare Post Acute Care list provided to:: Patient Choice offered to / list presented to : Patient  Expected Discharge Plan and Services Expected Discharge Plan: [place]   Discharge Planning Services: CM Consult Post Acute Care Choice: Independence Living arrangements for the past 2 months: [place]                                      Prior Living Arrangements/Services Living arrangements for the past 2 months: [place] with:: Self Patient language and need for interpreter reviewed:: Yes        Need for Family Participation in Patient Care: Yes (Comment) Care giver support system in place?: No (comment) Current home  services: DME (Patient currently has Rollator, RW, and Radio producer) Criminal Activity/Legal Involvement Pertinent to Current Situation/Hospitalization: No - Comment as needed  Activities of Daily Living Home Assistive Devices/Equipment: Cane (specify quad or straight), Walker (specify type) ADL Screening (condition at time of admission) Patient's cognitive ability adequate to safely complete daily activities?: Yes Is the patient deaf or have difficulty hearing?: No Does the patient have difficulty seeing, even when wearing glasses/contacts?: No Does the patient have difficulty concentrating, remembering, or making decisions?: No Patient able to express need for assistance with ADLs?: Yes Does the patient have difficulty dressing or bathing?: No Independently performs ADLs?: Yes (appropriate for developmental age) Does the patient have difficulty walking or climbing stairs?: Yes Weakness of Legs: Left Weakness of Arms/Hands: None  Permission Sought/Granted Permission sought to share information with : Case Manager Permission granted to share information with : Yes, Verbal Permission Granted     Permission granted to share info w AGENCY: SNf facility  Permission granted to share info w Relationship: friend - Hubbard Hartshorn - [phone]     Emotional Assessment Appearance:: Appears stated age Attitude/Demeanor/Rapport: Irrational Affect (typically observed): Frustrated, Angry Orientation: : Oriented to Self, Oriented to Place, Oriented to  Time, Oriented to Situation Alcohol / Substance Use: Tobacco Use Psych Involvement: No (comment)  Admission diagnosis:  Arthritis of left knee [M17.12] Patient Active Problem List   Diagnosis Date Noted  .  Arthritis of left knee 03/27/2020  . Humeral surgical neck fracture 02/27/2012    Class: Acute  . 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 Pulmonary nodule 02/27/2012    Class: Chronic  . Drug-seeking behavior 02/25/2011  .  ROTATOR CUFF REPAIR, RIGHT, HX OF 03/27/2010  . HYPERLIPIDEMIA 02/21/2010  . NECK PAIN, CHRONIC 11/17/2007  . DEPRESSION 11/11/2007  . OSTEOPOROSIS 11/11/2007   PCP:  Raina Mina, MD Pharmacy:   [id number] [place] [place] [address] [place] [address] Phone: [phone] Fax: [phone]     Social Determinants of Health (SDOH) Interventions    Readmission Risk Interventions No flowsheet data found.

## 2020-03-28 NOTE — Progress Notes (Signed)
  Subjective: [name] is a 69 y.o. female s/p left TKA.  They are POD1.  Pt's pain is moderate to severe.  Pt denies numbness/tingling/weakness.  Pt has stood with PT but had difficulty with ambulation.     Objective: Vital signs in last 24 hours: Temp:  [97.5 F (36.4 C)-99.1 F (37.3 C)] 98.4 F (36.9 C) (07/14 0801) Pulse Rate:  [57-99] 99 (07/14 0801) Resp:  [13-18] 18 (07/14 0801) BP: (91-135)/(47-96) 134/63 (07/14 0801) SpO2:  [93 %-100 %] 93 % (07/14 0801)  Intake/Output from previous day: 07/13 0701 - 07/14 0700 In: 2978.8 [P.O.:120; I.V.:2598.8; IV Piggyback:260] Out: 275 [Urine:200; Blood:75] Intake/Output this shift: No intake/output data recorded.  Exam:  No gross blood or drainage overlying the dressing 2+ DP pulse Sensation intact distally in the left foot Able to dorsiflex and plantarflex the left foot   Labs: Recent Labs    03/27/20 0855  HGB 13.9   Recent Labs    03/27/20 0855  WBC 8.4  RBC 4.96  HCT 42.1  PLT 390   Recent Labs    03/27/20 0855  NA 138  K 3.6  CL 101  CO2 25  BUN 17  CREATININE 1.37*  GLUCOSE 111*  CALCIUM 9.5   No results for input(s): LABPT, INR in the last 72 hours.  Assessment/Plan: Pt is POD1 s/p left TKA.    -Plan to discharge to home in coming days pending patient's pain and PT eval  -WBAT with a walker   Jalani Rominger L Libby Goehring 03/28/2020, 1:58 PM

## 2020-03-28 NOTE — Evaluation (Signed)
Physical Therapy Evaluation Patient Details Name: CLAUDE SWENDSEN MRN: [id number] DOB: 07-Apr-1951 Today's Date: 03/28/2020   History of Present Illness  Pt is a 69 y.o. F with significant PMH of back surgery, R rotator cuff repair, osteoporosis, left hip replacement, right knee surgery who presents with left knee osteoarthritis s/p left total knee arthroplasty 03/27/2020.  Clinical Impression  Pt admitted s/p procedure above. Prior to admission, pt lives alone in a house with 7 steps to enter, and uses a cane for mobility. On PT evaluation, pt presents with decreased functional mobility secondary to left knee pain, decreased ROM, weakness, gait abnormalities, balance deficits and decreased activity tolerance. Pt requires increased encouragement to mobilize out of bed and utilize a bedside commode versus a bed pan. Requiring min assist for transfers and taking pivotal steps using a walker. Further ambulation limited due to sharp increase in pain (despite premedication). Rest of session focused on initiating HEP for strength/ROM. Pt left knee lacking 6 degrees from neutral and achieving 80 degree flexion. Will continue to progress as tolerated.     Follow Up Recommendations Home health PT;Supervision for mobility/OOB    Equipment Recommendations  3in1 (PT); tub bench   Recommendations for Other Services OT consult     Precautions / Restrictions Precautions Precautions: Fall Restrictions Weight Bearing Restrictions: No      Mobility  Bed Mobility Overal bed mobility: Needs Assistance Bed Mobility: Supine to Sit     Supine to sit: Min guard     General bed mobility comments: Progressing to edge of bed without physical assist  Transfers Overall transfer level: Needs assistance Equipment used: Rolling walker (2 wheeled) Transfers: Sit to/from [employer] Sit to Stand: Min assist Stand pivot transfers: Min assist       General transfer comment: MinA to rise to  stand from edge of bed and BSC, pivoting towards left onto [facility].   Ambulation/Gait Ambulation/Gait assistance: Min assist Gait Distance (Feet): 3 Feet Assistive device: Rolling walker (2 wheeled) Gait Pattern/deviations: Step-to pattern;Trunk flexed;Antalgic Gait velocity: decreased   General Gait Details: MinA for pivotal steps from [facility] to chair, cues for sequencing/direction  Stairs            Wheelchair Mobility    Modified Rankin (Stroke Patients Only)       Balance Overall balance assessment: Needs assistance Sitting-balance support: Feet supported Sitting balance-Leahy Scale: Good     Standing balance support: Bilateral upper extremity supported Standing balance-Leahy Scale: Poor Standing balance comment: reliant on external support                             Pertinent Vitals/Pain Pain Assessment: Faces Faces Pain Scale: Hurts whole lot Pain Location: left knee with movement Pain Descriptors / Indicators: Guarding;Grimacing Pain Intervention(s): Limited activity within patient's tolerance;Monitored during session;Premedicated before session;Repositioned    Home Living Family/patient expects to be discharged to:: Private residence Living Arrangements: Alone Available Help at Discharge: Friend(s);Available PRN/intermittently (coming 2x/week from [facility]) Type of Home: House Home Access: Stairs to enter Entrance Stairs-Rails: Left Entrance Stairs-Number of Steps: 7 Home Layout: One level Home Equipment: Walker - 2 wheels;Walker - 4 wheels;Cane - single point      Prior Function Level of Independence: Independent with assistive device(s)         Comments: Negotiates steps with cane, sits on edge of tub to wash up     Hand Dominance  Extremity/Trunk Assessment   Upper Extremity Assessment Upper Extremity Assessment: Overall WFL for tasks assessed    Lower Extremity Assessment Lower Extremity Assessment: LLE  deficits/detail LLE Deficits / Details: s/p TKA. Able to perform quad set, LAQ    Cervical / Trunk Assessment Cervical / Trunk Assessment: Normal  Communication   Communication: No difficulties  Cognition Arousal/Alertness: Awake/alert Behavior During Therapy: WFL for tasks assessed/performed Overall Cognitive Status: No family/caregiver present to determine baseline cognitive functioning                                 General Comments: Pt impulsive, emotionally labile throughout session, distracted by pain      General Comments      Exercises Total Joint Exercises Quad Sets: Both;15 reps;Seated Heel Slides: Left;5 reps;Seated Long Arc Quad: Left;10 reps;Seated Goniometric ROM: Seated: 6-80 degrees    Assessment/Plan    PT Assessment Patient needs continued PT services  PT Problem List Decreased strength;Decreased range of motion;Decreased activity tolerance;Decreased mobility;Decreased balance;Pain       PT Treatment Interventions DME instruction;Gait training;Stair training;Functional mobility training;Therapeutic exercise;Therapeutic activities;Balance training;Patient/family education    PT Goals (Current goals can be found in the Care Plan section)  Acute Rehab PT Goals Patient Stated Goal: less pain, no knee deformity PT Goal Formulation: With patient Time For Goal Achievement: 04/11/20 Potential to Achieve Goals: Good    Frequency 7X/week   Barriers to discharge Decreased caregiver support      Co-evaluation               AM-PAC PT "6 Clicks" Mobility  Outcome Measure Help needed turning from your back to your side while in a flat bed without using bedrails?: None Help needed moving from lying on your back to sitting on the side of a flat bed without using bedrails?: A Little Help needed moving to and from a bed to a chair (including a wheelchair)?: A Little Help needed standing up from a chair using your arms (e.g., wheelchair or  bedside chair)?: A Little Help needed to walk in hospital room?: A Little Help needed climbing 3-5 steps with a railing? : A Lot 6 Click Score: 18    End of Session   Activity Tolerance: Patient limited by pain Patient left: in chair;with call bell/phone within reach Nurse Communication: Mobility status PT Visit Diagnosis: Pain;Difficulty in walking, not elsewhere classified (R26.2);Other abnormalities of gait and mobility (R26.89) Pain - Right/Left: Left Pain - part of body: Knee    Time: 5784-6962 PT Time Calculation (min) (ACUTE ONLY): 39 min   Charges:   PT Evaluation $PT Eval Moderate Complexity: 1 Mod PT Treatments $Gait Training: 8-22 mins $Therapeutic Activity: 8-22 mins          Lillia Pauls, PT, DPT Acute Rehabilitation Services Pager [phone] Office [phone]   [name] 03/28/2020, 10:07 AM

## 2020-03-28 NOTE — Plan of Care (Signed)

## 2020-03-28 NOTE — Care Management Obs Status (Signed)
MEDICARE OBSERVATION STATUS NOTIFICATION   Patient Details  Name: [name] MRN: [id number] Date of Birth: 04/28/1951   Medicare Observation Status Notification Given:  Yes    Janae Bridgeman, RN 03/28/2020, 3:08 PM

## 2020-03-29 ENCOUNTER — Encounter (HOSPITAL_COMMUNITY): Payer: Self-pay | Admitting: Orthopedic Surgery

## 2020-03-29 NOTE — Progress Notes (Signed)
Physical Therapy Treatment Patient Details Name: [name] MRN: [id number] DOB: September 21, 1950 Today's Date: 03/29/2020    History of Present Illness Pt is a 69 y.o. F with significant PMH of back surgery, R rotator cuff repair, osteoporosis, left hip replacement, right knee surgery who presents with left knee osteoarthritis s/p left total knee arthroplasty 03/27/2020.    PT Comments    Pt with slight regression towards physical therapy goals today due to increased pain (premedicated prior to session). Ambulating 15 feet with a walker at a min assist level; requires frequent standing rest breaks with her forearms resting on the walker. Rest of session focused on seated exercises for left knee ROM/strengthening. Continue to recommend SNF for ongoing Physical Therapy.       Follow Up Recommendations  SNF     Equipment Recommendations  3in1 (PT)    Recommendations for Other Services OT consult     Precautions / Restrictions Precautions Precautions: Fall Restrictions Weight Bearing Restrictions: Yes LLE Weight Bearing: Weight bearing as tolerated    Mobility  Bed Mobility Overal bed mobility: Needs Assistance Bed Mobility: Supine to Sit;Sit to Supine     Supine to sit: Min guard     General bed mobility comments: Min guard for safety, no physical assist required. increased time/effort  Transfers Overall transfer level: Needs assistance Equipment used: Rolling walker (2 wheeled) Transfers: Sit to/from [employer] Sit to Stand: Min assist Stand pivot transfers: Min assist       General transfer comment: MinA to rise to stand from edge of bed and pivot towards left onto [employer]. Cues for sequencing/direction  Ambulation/Gait Ambulation/Gait assistance: Min assist Gait Distance (Feet): 15 Feet Assistive device: Rolling walker (2 wheeled) Gait Pattern/deviations: Step-to pattern;Trunk flexed;Antalgic Gait velocity: decreased   General Gait Details: Cues  for sequencing, walker proximity, left quad activation   Stairs             Wheelchair Mobility    Modified Rankin (Stroke Patients Only)       Balance Overall balance assessment: Needs assistance Sitting-balance support: Feet supported Sitting balance-Leahy Scale: Good     Standing balance support: Bilateral upper extremity supported Standing balance-Leahy Scale: Poor Standing balance comment: reliant on external support                            Cognition Arousal/Alertness: Awake/alert Behavior During Therapy: WFL for tasks assessed/performed Overall Cognitive Status: No family/caregiver present to determine baseline cognitive functioning                                 General Comments: Pt impulsive, emotionally labile throughout session, distracted by pain. Continuously asks to use bedpan, despite education on using BSC to promote out of bed mobility.      Exercises Total Joint Exercises Quad Sets: Both;15 reps;Seated Towel Squeeze: Both;10 reps;Seated Heel Slides: Left;5 reps;Seated;AAROM Hip ABduction/ADduction: Left;10 reps;Supine Long Arc Quad: Left;10 reps;Seated    General Comments        Pertinent Vitals/Pain Pain Assessment: Faces Faces Pain Scale: Hurts whole lot Pain Location: left knee with movement Pain Descriptors / Indicators: Guarding;Grimacing Pain Intervention(s): Monitored during session;Limited activity within patient's tolerance;Premedicated before session    Home Living                      Prior Function  PT Goals (current goals can now be found in the care plan section) Acute Rehab PT Goals Patient Stated Goal: less pain, no knee deformity PT Goal Formulation: With patient Time For Goal Achievement: 04/11/20 Potential to Achieve Goals: Good Progress towards PT goals: Not progressing toward goals - comment (more limited by pain today)    Frequency    7X/week      PT  Plan Discharge plan needs to be updated    Co-evaluation              AM-PAC PT "6 Clicks" Mobility   Outcome Measure  Help needed turning from your back to your side while in a flat bed without using bedrails?: None Help needed moving from lying on your back to sitting on the side of a flat bed without using bedrails?: A Little Help needed moving to and from a bed to a chair (including a wheelchair)?: A Little Help needed standing up from a chair using your arms (e.g., wheelchair or bedside chair)?: A Little Help needed to walk in hospital room?: A Little Help needed climbing 3-5 steps with a railing? : A Lot 6 Click Score: 18    End of Session   Activity Tolerance: Patient limited by pain Patient left: with call bell/phone within reach;in bed Nurse Communication: Mobility status PT Visit Diagnosis: Pain;Difficulty in walking, not elsewhere classified (R26.2);Other abnormalities of gait and mobility (R26.89) Pain - Right/Left: Left Pain - part of body: Knee     Time: 7672-0947 PT Time Calculation (min) (ACUTE ONLY): 28 min  Charges:  $Gait Training: 8-22 mins $Therapeutic Exercise: 8-22 mins                       Lillia Pauls, PT, DPT Acute Rehabilitation Services Pager [phone] Office [phone]    [name] 03/29/2020, 12:52 PM

## 2020-03-29 NOTE — Anesthesia Postprocedure Evaluation (Signed)
Anesthesia Post Note  Patient: [name]  Procedure(s) Performed: LEFT TOTAL KNEE ARTHROPLASTY (Left Knee)     Patient location during evaluation: PACU Anesthesia Type: Regional, MAC and Spinal Level of consciousness: awake and alert Pain management: pain level controlled Vital Signs Assessment: post-procedure vital signs reviewed and stable Respiratory status: spontaneous breathing, nonlabored ventilation, respiratory function stable and patient connected to nasal cannula oxygen Cardiovascular status: stable and blood pressure returned to baseline Postop Assessment: no apparent nausea or vomiting and spinal receding Anesthetic complications: no   No complications documented.  Last Vitals:  Vitals:   03/29/20 0525 03/29/20 0758  BP: (!) 114/56 (!) 129/58  Pulse: 89 84  Resp: 14 18  Temp: 37.2 C 37.1 C  SpO2: 91% 100%    Last Pain:  Vitals:   03/29/20 0758  TempSrc: Oral  PainSc:                  [name]

## 2020-03-29 NOTE — Plan of Care (Signed)
?  Problem: Coping: ?Goal: Level of anxiety will decrease ?Outcome: Progressing ?  ?Problem: Safety: ?Goal: Ability to remain free from injury will improve ?Outcome: Progressing ?  ?

## 2020-03-29 NOTE — Evaluation (Signed)
Occupational Therapy Evaluation Patient Details Name: [name] MRN: [id number] DOB: 1951-04-02 Today's Date: 03/29/2020    History of Present Illness Pt is a 69 y.o. F with significant PMH of back surgery, R rotator cuff repair, osteoporosis, left hip replacement, right knee surgery who presents with left knee osteoarthritis s/p left total knee arthroplasty 03/27/2020.   Clinical Impression   PTA Pt reports mod I with DME - but limited due to pain. Pt today presents with deficits in transfers, balance, ROM, and decreased independence in ADL. Pt is mod to max A for LB ADL, min A for transfers with RW - requiring assist for boost and balance. She is unable to perform ADL in standing, and will require skilled OT in the acute setting as well as post-acute at the SNF level to maximize safety and independence in ADL and functional transfers. Next session to focus on education with AE for LB ADL as well as activity tolerance, balance and functional stamina.     Follow Up Recommendations  SNF    Equipment Recommendations  Other (comment) (defer to next venue of care)    Recommendations for Other Services       Precautions / Restrictions Precautions Precautions: Fall Restrictions Weight Bearing Restrictions: Yes LLE Weight Bearing: Weight bearing as tolerated      Mobility Bed Mobility               General bed mobility comments: OOB at beginning and end of session  Transfers Overall transfer level: Needs assistance Equipment used: Rolling walker (2 wheeled) Transfers: Sit to/from [employer] Sit to Stand: Min assist Stand pivot transfers: Min assist       General transfer comment: MinA to for boost and balance    Balance Overall balance assessment: Needs assistance Sitting-balance support: Feet supported Sitting balance-Leahy Scale: Good     Standing balance support: Bilateral upper extremity supported Standing balance-Leahy Scale: Poor Standing  balance comment: reliant on external support                           ADL either performed or assessed with clinical judgement   ADL Overall ADL's : Needs assistance/impaired Eating/Feeding: Modified independent;Sitting   Grooming: Wash/dry hands;Wash/dry face;Set up;Sitting   Upper Body Bathing: Set up;Sitting   Lower Body Bathing: Moderate assistance;Sitting/lateral leans   Upper Body Dressing : Set up;Sitting   Lower Body Dressing: Maximal assistance;Sit to/from stand   Toilet Transfer: Minimal assistance;Ambulation;RW Toilet Transfer Details (indicate cue type and reason): encouragement to use BSC, and ambulate to bathroom with RN staff Toileting- Clothing Manipulation and Hygiene: Set up;Sitting/lateral lean       Functional mobility during ADLs: Minimal assistance;Rolling walker       Vision Patient Visual Report: No change from baseline       Perception     Praxis      Pertinent Vitals/Pain Pain Assessment: 0-10 Pain Score: 8  Pain Location: left knee with movement Pain Descriptors / Indicators: Guarding;Grimacing Pain Intervention(s): Monitored during session;Repositioned     Hand Dominance Right   Extremity/Trunk Assessment Upper Extremity Assessment Upper Extremity Assessment: Overall WFL for tasks assessed (baseline deficits in R and L shoulder)   Lower Extremity Assessment Lower Extremity Assessment: LLE deficits/detail LLE Deficits / Details: typical deficits post-op   Cervical / Trunk Assessment Cervical / Trunk Assessment: Normal   Communication Communication Communication: No difficulties   Cognition Arousal/Alertness: Awake/alert Behavior During Therapy: WFL for tasks  assessed/performed Overall Cognitive Status: No family/caregiver present to determine baseline cognitive functioning                                 General Comments: Pt emotionally labile throughout session, perseverates on RN care during stay    General Comments       Exercises     Shoulder Instructions      Home Living Family/patient expects to be discharged to:: Private residence Living Arrangements: Alone Available Help at Discharge: Friend(s);Available PRN/intermittently Type of Home: House Home Access: Stairs to enter [employer] of Steps: 7 Entrance Stairs-Rails: Left Home Layout: One level     Bathroom Shower/Tub: Chief Strategy Officer: Standard Bathroom Accessibility: Yes How Accessible: Accessible via walker Home Equipment: Walker - 2 wheels;Walker - 4 wheels;Cane - single point          Prior Functioning/Environment Level of Independence: Independent with assistive device(s)        Comments: Negotiates steps with cane, sits on edge of tub to wash up        OT Problem List: Decreased activity tolerance;Decreased range of motion;Impaired balance (sitting and/or standing);Decreased safety awareness;Decreased knowledge of use of DME or AE;Pain;Increased edema      OT Treatment/Interventions: Self-care/ADL training;DME and/or AE instruction;Therapeutic activities;Patient/family education;Balance training    OT Goals(Current goals can be found in the care plan section) Acute Rehab OT Goals Patient Stated Goal: less pain, no knee deformity OT Goal Formulation: With patient Time For Goal Achievement: 04/12/20 Potential to Achieve Goals: Good ADL Goals Pt Will Perform Grooming: with supervision;standing Pt Will Perform Lower Body Bathing: with modified independence;with adaptive equipment;sitting/lateral leans Pt Will Perform Lower Body Dressing: with adaptive equipment;with supervision;sit to/from stand Pt Will Transfer to Toilet: with modified independence;ambulating Pt Will Perform Toileting - Clothing Manipulation and hygiene: with modified independence;sitting/lateral leans  OT Frequency: Min 2X/week   Barriers to D/C: Decreased caregiver support  Pt has a friend that  can check on her 2x/wk       Co-evaluation              AM-PAC OT "6 Clicks" Daily Activity     Outcome Measure Help from another person eating meals?: None Help from another person taking care of personal grooming?: A Little Help from another person toileting, which includes using toliet, bedpan, or urinal?: A Little Help from another person bathing (including washing, rinsing, drying)?: A Lot Help from another person to put on and taking off regular upper body clothing?: A Little Help from another person to put on and taking off regular lower body clothing?: A Lot 6 Click Score: 17   End of Session Equipment Utilized During Treatment: Gait belt;Rolling walker CPM Left Knee CPM Left Knee: Off Nurse Communication: Mobility status  Activity Tolerance: Patient tolerated treatment well;Patient limited by pain Patient left: in chair;with call bell/phone within reach;with chair alarm set  OT Visit Diagnosis: Unsteadiness on feet (R26.81);Other abnormalities of gait and mobility (R26.89);Other symptoms and signs involving cognitive function;Pain Pain - Right/Left: Left Pain - part of body: Knee                Time: 4970-2637 OT Time Calculation (min): 22 min Charges:  OT General Charges $OT Visit: 1 Visit OT Evaluation $OT Eval Moderate Complexity: 1 Mod  Nyoka Cowden OTR/L Acute Rehabilitation Services Pager: [phone] Office: [phone]  Evern Bio Gladine Plude 03/29/2020, 6:22 PM

## 2020-03-29 NOTE — TOC Progression Note (Signed)
Transition of Care [facility]) - Progression Note    Patient Details  Name: [name] MRN: [id number] Date of Birth: 01-Oct-1950  Transition of Care [facility]) CM/SW [place], RN Phone Number: 03/29/2020, 3:10 PM  Clinical Narrative:    Case management met with the patient to offer the patient choice regarding facilities in [place] area and patient is irritated and crying and asking that she be placed in a [facility] of her choice.  Patient provided medicare.gov choice in [place].  [employer] called and insurance auth started with pending bed choice.   Expected Discharge Plan: Skilled Nursing Facility Barriers to Discharge: Unsafe home situation (lives alone)  Expected Discharge Plan and Services Expected Discharge Plan: [place]   Discharge Planning Services: CM Consult Post Acute Care Choice: [place] Living arrangements for the past 2 months: [place] Determinants of Health (SDOH) Interventions    Readmission Risk Interventions No flowsheet data found.

## 2020-03-29 NOTE — Progress Notes (Signed)
  Subjective: [name] is a 69 y.o. female s/p left TKA.  They are POD 2.  Pt's pain is moderate but controlled in bed.  Pt denies numbness/tingling/weakness.  Pt has ambulated with great difficulty.   She was able to walk to the door yesterday as well as walk to the chair to sit.  Objective: Vital signs in last 24 hours: Temp:  [98.8 F (37.1 C)-99.4 F (37.4 C)] 98.8 F (37.1 C) (07/15 0758) Pulse Rate:  [84-89] 84 (07/15 0758) Resp:  [14-18] 18 (07/15 0758) BP: (114-135)/(56-98) 129/58 (07/15 0758) SpO2:  [91 %-100 %] 100 % (07/15 0758)  Intake/Output from previous day: 07/14 0701 - 07/15 0700 In: 680 [P.O.:480; IV Piggyback:200] Out: -  Intake/Output this shift: No intake/output data recorded.  Exam:  No gross blood or drainage overlying the dressing Foot is warm and well-perfused Sensation intact distally in the left foot Able to dorsiflex and plantarflex the left foot   Labs: Recent Labs    03/27/20 0855  HGB 13.9   Recent Labs    03/27/20 0855  WBC 8.4  RBC 4.96  HCT 42.1  PLT 390   Recent Labs    03/27/20 0855  NA 138  K 3.6  CL 101  CO2 25  BUN 17  CREATININE 1.37*  GLUCOSE 111*  CALCIUM 9.5   No results for input(s): LABPT, INR in the last 72 hours.  Assessment/Plan: Pt is POD 2 s/p left TKA.    -Plan to discharge to SNF in next several days.  Patient is agreeable to this plan.  -WBAT with a walker    [name] 03/29/2020, 8:43 AM

## 2020-03-30 MED ORDER — VENLAFAXINE HCL ER 150 MG PO CP24
150.0000 mg | ORAL_CAPSULE | Freq: Every day | ORAL | Status: DC
  Administered 2020-03-30 – 2020-04-03 (×5): 150 mg via ORAL
  Filled 2020-03-30 (×5): qty 1

## 2020-03-30 MED ORDER — POLYETHYLENE GLYCOL 3350 17 G PO PACK
17.0000 g | PACK | Freq: Every day | ORAL | Status: DC | PRN
  Administered 2020-03-31 – 2020-04-02 (×3): 17 g via ORAL
  Filled 2020-03-30 (×4): qty 1

## 2020-03-30 MED ORDER — POLYETHYLENE GLYCOL 3350 17 GM/SCOOP PO POWD: 1.0000 | Freq: Once | ORAL | Status: DC

## 2020-03-30 NOTE — Plan of Care (Signed)

## 2020-03-30 NOTE — Progress Notes (Signed)
PT Cancellation Note  Patient Details Name: [name] MRN: [id number] DOB: [birth year]   Cancelled Treatment:    Reason Eval/Treat Not Completed: Other (comment) Pt adamantly refusing therapy despite encouragement for ambulation or therapeutic exercises. Pt becoming very upset and agitated, stating she was upset that PT did not work with her yesterday (despite me reminding her I did).     Lillia Pauls, PT, DPT Acute Rehabilitation Services Pager [phone] Office [phone]    [name] 03/30/2020, 12:42 PM

## 2020-03-30 NOTE — Progress Notes (Signed)
Notified [employer], PA-C about pt's complaints of pain. Pt states oxycodone does not help pain at all, refused oxycodone 10mg . Explained to pt that pain has to be controlled with oral pain medications prior to d/c. PA will speak with pt this afternoon.

## 2020-03-30 NOTE — Progress Notes (Signed)
Orthopedic Tech Progress Note Patient Details:  [name] 1950-10-22 [id number] Put patient on CPM Patient ID: [name], female   DOB: Aug 07, 1951, 69 y.o.   MRN: [id number]   Donald Pore 03/30/2020, 4:31 PM

## 2020-03-30 NOTE — Progress Notes (Signed)
  Subjective: [name] is a 69 y.o. female s/p left TKA.  They are POD3.  Pt's pain is moderate but controlled.  She is sitting in bed and appears to be comfortable.  Pain is worse when ambulating.  Pt has ambulated with great difficulty.   Objective: Vital signs in last 24 hours: Temp:  [98.9 F (37.2 C)-99.7 F (37.6 C)] 98.9 F (37.2 C) (07/16 1541) Pulse Rate:  [75-92] 75 (07/16 1541) Resp:  [17-18] 17 (07/16 1541) BP: (103-139)/(55-111) 119/55 (07/16 1541) SpO2:  [93 %-100 %] 93 % (07/16 1541)  Intake/Output from previous day: 07/15 0701 - 07/16 0700 In: 480 [P.O.:480] Out: -  Intake/Output this shift: Total I/O In: -  Out: 450 [Urine:450]  Exam:  No gross blood or drainage overlying the dressing Foot is warm and well-perfused Sensation intact distally in the left foot Able to dorsiflex and plantarflex the left foot   Labs: No results for input(s): HGB in the last 72 hours. No results for input(s): WBC, RBC, HCT, PLT in the last 72 hours. No results for input(s): NA, K, CL, CO2, BUN, CREATININE, GLUCOSE, CALCIUM in the last 72 hours. No results for input(s): LABPT, INR in the last 72 hours.  Assessment/Plan: Pt is POD3 s/p left TKA.    -Plan to discharge to SNF in coming days pending patient's pain and PT eval  -Patient refusing oral Oxycodone today  -WBAT with a walker   Mammie Meras L Janeya Deyo 03/30/2020, 5:16 PM

## 2020-03-30 NOTE — Plan of Care (Signed)
  Problem: Safety: Goal: Ability to remain free from injury will improve Outcome: Progressing   

## 2020-03-30 NOTE — TOC Progression Note (Signed)
Transition of Care [facility]) - Progression Note    Patient Details  Name: JEANELLE DAKE MRN: [id number] Date of Birth: 28-May-1951  Transition of Care [facility]) CM/SW Contact  Curlene Labrum, RN Phone Number: 03/30/2020, 4:19 PM  Clinical Narrative:    Case Management met with the patient and friend, Hubbard Hartshorn - regarding choice of facility for SNF.  Patient states that she is considering [facility] and would like to speak to the facility.  Will continue to follow for choice facility for the patient and return call to [facility] for confirmation.  Insurance Josem Kaufmann was started - ref# [id number] - just have to determine the facility of her choice.  Expected Discharge Plan: Skilled Nursing Facility Barriers to Discharge: Unsafe home situation (lives alone)  Expected Discharge Plan and Services Expected Discharge Plan: [place]   Discharge Planning Services: CM Consult Post Acute Care Choice: [place] Living arrangements for the past 2 months: [place] Determinants of Health (SDOH) Interventions    Readmission Risk Interventions No flowsheet data found.

## 2020-03-31 DIAGNOSIS — E7800 Pure hypercholesterolemia, unspecified: Secondary | ICD-10-CM | POA: Diagnosis present

## 2020-03-31 DIAGNOSIS — E785 Hyperlipidemia, unspecified: Secondary | ICD-10-CM | POA: Diagnosis present

## 2020-03-31 DIAGNOSIS — Z96642 Presence of left artificial hip joint: Secondary | ICD-10-CM | POA: Diagnosis present

## 2020-03-31 DIAGNOSIS — Z20822 Contact with and (suspected) exposure to covid-19: Secondary | ICD-10-CM | POA: Diagnosis present

## 2020-03-31 DIAGNOSIS — M810 Age-related osteoporosis without current pathological fracture: Secondary | ICD-10-CM | POA: Diagnosis present

## 2020-03-31 DIAGNOSIS — M1712 Unilateral primary osteoarthritis, left knee: Secondary | ICD-10-CM | POA: Diagnosis present

## 2020-03-31 DIAGNOSIS — Z90710 Acquired absence of both cervix and uterus: Secondary | ICD-10-CM | POA: Diagnosis not present

## 2020-03-31 DIAGNOSIS — I10 Essential (primary) hypertension: Secondary | ICD-10-CM | POA: Diagnosis present

## 2020-03-31 DIAGNOSIS — F17210 Nicotine dependence, cigarettes, uncomplicated: Secondary | ICD-10-CM | POA: Diagnosis present

## 2020-03-31 DIAGNOSIS — K219 Gastro-esophageal reflux disease without esophagitis: Secondary | ICD-10-CM | POA: Diagnosis present

## 2020-03-31 NOTE — Progress Notes (Signed)
  Subjective: Patient stable.  Sitting in bed with the knee flexed to about 90 degrees.   Objective: Vital signs in last 24 hours: Temp:  [98.9 F (37.2 C)-99.5 F (37.5 C)] 98.9 F (37.2 C) (07/17 0835) Pulse Rate:  [72-81] 72 (07/17 0835) Resp:  [17-18] 17 (07/17 0835) BP: (99-120)/(55-105) 99/86 (07/17 0835) SpO2:  [85 %-97 %] 97 % (07/17 0835)  Intake/Output from previous day: 07/16 0701 - 07/17 0700 In: -  Out: 450 [Urine:450] Intake/Output this shift: No intake/output data recorded.  Exam:  Dorsiflexion/Plantar flexion intact No cellulitis present  Labs: No results for input(s): HGB in the last 72 hours. No results for input(s): WBC, RBC, HCT, PLT in the last 72 hours. No results for input(s): NA, K, CL, CO2, BUN, CREATININE, GLUCOSE, CALCIUM in the last 72 hours. No results for input(s): LABPT, INR in the last 72 hours.  Assessment/Plan: Plan at this time is transfer to skilled nursing when bed becomes available.  She is ready to go from an orthopedic standpoint.  Let us know when bed is available and we will facilitate transfer at that time.   Marrianne Mood Kenai Fluegel 03/31/2020, 9:02 AM

## 2020-03-31 NOTE — TOC Progression Note (Signed)
Transition of Care [facility]) - Progression Note    Patient Details  Name: [name] MRN: [id number] Date of Birth: 12/04/1950  Transition of Care [facility]) CM/SW Contact  Jimmy Picket, [place] Phone Number: 03/31/2020, 3:49 PM  Clinical Narrative:     Pt chose [facility]. CSW called and updated [facility] of patients choice. Insurance Berkley Harvey is still pending, representative states it should be approved by end of day.   Expected Discharge Plan: Skilled Nursing Facility Barriers to Discharge: Unsafe home situation (lives alone)  Expected Discharge Plan and Services Expected Discharge Plan: Skilled Nursing Facility   Discharge Planning Services: CM Consult Post Acute Care Choice: Skilled Nursing Facility Living arrangements for the past 2 months: Board[facility] House                                       Social Determinants of Health (SDOH) Interventions    Readmission Risk Interventions No flowsheet data found.  Jimmy Picket, Theresia Majors, [place] Clinical Social Worker [phone]

## 2020-03-31 NOTE — Progress Notes (Signed)
Physical Therapy Treatment Patient Details Name: [name] MRN: [id number] DOB: 12/01/1950 Today's Date: 03/31/2020    History of Present Illness Pt is a 69 y.o. F with significant PMH of back surgery, R rotator cuff repair, osteoporosis, left hip replacement, right knee surgery who presents with left knee osteoarthritis s/p left total knee arthroplasty 03/27/2020.    PT Comments    Pt supine in bed on arrival this session sleeping.  Pt was difficult to rouse.  Pt required assistance for safety but is functioning at supervision-min guard level of assistance.  Pt continues to present with impulsive behavior and is quite unsteady continue to recommend snf placement at d/c to maximize functional gains before returning home.      Follow Up Recommendations  SNF     Equipment Recommendations  3in1 (PT)    Recommendations for Other Services       Precautions / Restrictions Precautions Precautions: Fall Restrictions Weight Bearing Restrictions: Yes LLE Weight Bearing: Weight bearing as tolerated    Mobility  Bed Mobility Overal bed mobility: Needs Assistance Bed Mobility: Supine to Sit;Sit to Supine     Supine to sit: Supervision Sit to supine: Supervision   General bed mobility comments: No physical assistance to move to edge of bed.  Transfers Overall transfer level: Needs assistance Equipment used: Rolling walker (2 wheeled) Transfers: Sit to/from Stand Sit to Stand: Supervision         General transfer comment: Pt able to rise into standing without assistance.  Ambulation/Gait Ambulation/Gait assistance: Min guard Gait Distance (Feet): 60 Feet Assistive device: Rolling walker (2 wheeled) Gait Pattern/deviations: Step-to pattern;Trunk flexed;Antalgic Gait velocity: decreased   General Gait Details: Cues for sequencing, walker proximity, left quad activation   Stairs             Wheelchair Mobility    Modified Rankin (Stroke Patients Only)        Balance Overall balance assessment: Needs assistance Sitting-balance support: Feet supported Sitting balance-Leahy Scale: Good       Standing balance-Leahy Scale: Poor Standing balance comment: reliant on external support                            Cognition Arousal/Alertness: Awake/alert Behavior During Therapy: WFL for tasks assessed/performed Overall Cognitive Status: No family/caregiver present to determine baseline cognitive functioning                                 General Comments: Pt very eratic during session.      Exercises Total Joint Exercises Goniometric ROM: 4-83 degrees.    General Comments        Pertinent Vitals/Pain Pain Assessment: 0-10 Faces Pain Scale: Hurts whole lot Pain Location: left knee with movement Pain Descriptors / Indicators: Guarding;Grimacing Pain Intervention(s): Monitored during session;Repositioned    Home Living                      Prior Function            PT Goals (current goals can now be found in the care plan section) Acute Rehab PT Goals Patient Stated Goal: less pain, no knee deformity Potential to Achieve Goals: Good Progress towards PT goals: Progressing toward goals    Frequency    7X/week      PT Plan Current plan remains appropriate    Co-evaluation  AM-PAC PT "6 Clicks" Mobility   Outcome Measure  Help needed turning from your back to your side while in a flat bed without using bedrails?: None Help needed moving from lying on your back to sitting on the side of a flat bed without using bedrails?: A Little Help needed moving to and from a bed to a chair (including a wheelchair)?: A Little Help needed standing up from a chair using your arms (e.g., wheelchair or bedside chair)?: A Little Help needed to walk in hospital room?: A Little Help needed climbing 3-5 steps with a railing? : A Lot 6 Click Score: 18    End of Session Equipment  Utilized During Treatment: Gait belt   Patient left: with call bell/phone within reach;in bed   PT Visit Diagnosis: Pain;Difficulty in walking, not elsewhere classified (R26.2);Other abnormalities of gait and mobility (R26.89) Pain - Right/Left: Left Pain - part of body: Knee     Time: 1131-1145 PT Time Calculation (min) (ACUTE ONLY): 14 min  Charges:  $Gait Training: 8-22 mins                     Bonney Leitz , PTA Acute Rehabilitation Services Pager [phone] Office [phone]     Sultan Pargas Artis Delay 03/31/2020, 1:17 PM

## 2020-03-31 NOTE — Plan of Care (Signed)
  Problem: Safety: Goal: Ability to remain free from injury will improve Outcome: Progressing   

## 2020-04-01 NOTE — Progress Notes (Signed)
Physical Therapy Treatment Patient Details Name: [name] MRN: [id number] DOB: 09-28-1950 Today's Date: 04/01/2020    History of Present Illness Pt is a 69 y.o. F with significant PMH of back surgery, R rotator cuff repair, osteoporosis, left hip replacement, right knee surgery who presents with left knee osteoarthritis s/p left total knee arthroplasty 03/27/2020.    PT Comments    Pt progressing slowly towards her physical therapy goals. Ambulating 60 feet with a walker at a min guard assist level. Continues with decreased left knee ROM, weakness, gait abnormalities and balance deficits. In light of impairments and decreased caregiver support, continue to recommend SNF.     Follow Up Recommendations  SNF     Equipment Recommendations  3in1 (PT)    Recommendations for Other Services       Precautions / Restrictions Precautions Precautions: Fall Restrictions Weight Bearing Restrictions: Yes LLE Weight Bearing: Weight bearing as tolerated    Mobility  Bed Mobility Overal bed mobility: Needs Assistance Bed Mobility: Supine to Sit     Supine to sit: Supervision     General bed mobility comments: Supervision for transition, no physical assist required  Transfers Overall transfer level: Needs assistance Equipment used: Rolling walker (2 wheeled) Transfers: Sit to/from Stand Sit to Stand: Min guard            Ambulation/Gait Ambulation/Gait assistance: Min guard Gait Distance (Feet): 60 Feet Assistive device: Rolling walker (2 wheeled) Gait Pattern/deviations: Step-to pattern;Trunk flexed;Antalgic;Step-through pattern;Decreased dorsiflexion - left Gait velocity: decreased   General Gait Details: cues for left quad activation, walker proximity, min guard for safety.    Stairs             Wheelchair Mobility    Modified Rankin (Stroke Patients Only)       Balance Overall balance assessment: Needs assistance Sitting-balance support: Feet  supported Sitting balance-Leahy Scale: Good     Standing balance support: Bilateral upper extremity supported Standing balance-Leahy Scale: Poor Standing balance comment: reliant on external support                            Cognition Arousal/Alertness: Awake/alert Behavior During Therapy: WFL for tasks assessed/performed Overall Cognitive Status: No family/caregiver present to determine baseline cognitive functioning                                 General Comments: Erratic behavior      Exercises Total Joint Exercises Towel Squeeze: Both;15 reps;Seated Heel Slides: Left;10 reps;Seated Long Arc Quad: Left;10 reps;Seated Goniometric ROM: Seated: 4-84 degrees    General Comments        Pertinent Vitals/Pain Pain Assessment: Faces Faces Pain Scale: Hurts little more Pain Location: left knee with movement Pain Descriptors / Indicators: Guarding;Grimacing Pain Intervention(s): Limited activity within patient's tolerance;Monitored during session    Home Living                      Prior Function            PT Goals (current goals can now be found in the care plan section) Acute Rehab PT Goals Patient Stated Goal: to go home Potential to Achieve Goals: Good Progress towards PT goals: Progressing toward goals    Frequency    7X/week      PT Plan Current plan remains appropriate    Co-evaluation  AM-PAC PT "6 Clicks" Mobility   Outcome Measure  Help needed turning from your back to your side while in a flat bed without using bedrails?: None Help needed moving from lying on your back to sitting on the side of a flat bed without using bedrails?: None Help needed moving to and from a bed to a chair (including a wheelchair)?: A Little Help needed standing up from a chair using your arms (e.g., wheelchair or bedside chair)?: A Little Help needed to walk in hospital room?: A Little Help needed climbing 3-5  steps with a railing? : A Little 6 Click Score: 20    End of Session Equipment Utilized During Treatment: Gait belt Activity Tolerance: Patient tolerated treatment well Patient left: in bed;with call bell/phone within reach Nurse Communication: Mobility status;Patient requests pain meds PT Visit Diagnosis: Pain;Difficulty in walking, not elsewhere classified (R26.2);Other abnormalities of gait and mobility (R26.89) Pain - Right/Left: Left Pain - part of body: Knee     Time: 2025-4270 PT Time Calculation (min) (ACUTE ONLY): 17 min  Charges:  $Gait Training: 8-22 mins                       Lillia Pauls, PT, DPT Acute Rehabilitation Services Pager [phone] Office [phone]    [name] 04/01/2020, 8:16 AM

## 2020-04-01 NOTE — Progress Notes (Signed)
Patient stable Pain controlled Tolerating CPM Ready for discharge to skilled nursing

## 2020-04-01 NOTE — Plan of Care (Signed)

## 2020-04-01 NOTE — Plan of Care (Signed)
  Problem: Activity: Goal: Risk for activity intolerance will decrease Outcome: Progressing   Problem: Coping: Goal: Level of anxiety will decrease Outcome: Progressing   Problem: Pain Managment: Goal: General experience of comfort will improve Outcome: Progressing   Problem: Safety: Goal: Ability to remain free from injury will improve Outcome: Progressing   Problem: Skin Integrity: Goal: Risk for impaired skin integrity will decrease Outcome: Progressing   

## 2020-04-02 LAB — SARS CORONAVIRUS 2 (TAT 6-24 HRS): SARS Coronavirus 2: NEGATIVE

## 2020-04-02 NOTE — Plan of Care (Signed)
  Problem: Pain Managment: Goal: General experience of comfort will improve Outcome: Progressing   Problem: Safety: Goal: Ability to remain free from injury will improve Outcome: Progressing   Problem: Skin Integrity: Goal: Risk for impaired skin integrity will decrease Outcome: Progressing   

## 2020-04-02 NOTE — Progress Notes (Signed)
  Subjective: [name] is a 69 y.o. female s/p left TKA.  They are POD6.  Pt's pain is controlled.   Pt has ambulated with some difficulty.  She has been ambulating around the halls.  She feels she is making excellent progress.     Objective: Vital signs in last 24 hours: Temp:  [98.4 F (36.9 C)-100 F (37.8 C)] 98.4 F (36.9 C) (07/19 0732) Pulse Rate:  [67-80] 67 (07/19 0732) Resp:  [16-17] 17 (07/19 0732) BP: (104-149)/(52-92) 149/70 (07/19 0732) SpO2:  [97 %-100 %] 98 % (07/19 0732)  Intake/Output from previous day: 07/18 0701 - 07/19 0700 In: 1561.4 [P.O.:600; I.V.:961.4] Out: -  Intake/Output this shift: No intake/output data recorded.  Exam:  No gross blood or drainage overlying the dressing Left foot warm and well-perfused Sensation intact distally in the left foot Able to dorsiflex and plantarflex the left foot Able to perform a straight leg raise of the leg leg relatively easily   Labs: No results for input(s): HGB in the last 72 hours. No results for input(s): WBC, RBC, HCT, PLT in the last 72 hours. No results for input(s): NA, K, CL, CO2, BUN, CREATININE, GLUCOSE, CALCIUM in the last 72 hours. No results for input(s): LABPT, INR in the last 72 hours.  Assessment/Plan: Pt is POD6 s/p left TKA.    -Plan to discharge to SNF today or tomorrow with insurance authorization   -Patient is demanding to go home prior to transfer to SNF to "collect belongings".  She refuses discharge to SNF without going home first.  Insurance authorization approved through tomorrow.  Contacted patient's social network for assistance with gathering belongings.  If no agreement can be reached, patient will have to be discharged home.   -WBAT with a walker     Azhar Yogi L Dominique Ressel 04/02/2020, 11:33 AM

## 2020-04-02 NOTE — Discharge Summary (Addendum)
Physician Discharge Summary      Patient ID: [name] MRN: [id number] DOB/AGE: [birth year] 71 y.o.  Admit date: 03/27/2020 Discharge date: 04/03/20  Admission Diagnoses:  Active Problems:   Arthritis of left knee   Discharge Diagnoses:  Same  Surgeries: Procedure(s): LEFT TOTAL KNEE ARTHROPLASTY on 03/27/2020   Consultants:   Discharged Condition: Stable  Hospital Course: [name] is an 69 y.o. female who was admitted 03/27/2020 with a chief complaint of left knee pain, and found to have a diagnosis of left knee OA.  They were brought to the operating room on 03/27/2020 and underwent the above named procedures.  Pt awoke from anesthesia without complication and was transferred to the floor. On POD1, patient had severe pain and had significant difficulty with ambulation and straight leg raise.  She has progressed well over the last week.  Now she is able to ambulate moderate distances and can perform straight leg easier.  With lack of social support at home, continue plan for SNF discharge.  Patient was approved and discharged to [facility] on 04/03/20.  Pt will f/u with Dr. August Saucer in clinic in ~2 weeks from her surgery date.  Marland Kitchen   Antibiotics given:  Anti-infectives (From admission, onward)   Start     Dose/Rate Route Frequency Ordered Stop   03/28/20 1130  vancomycin (VANCOCIN) IVPB 1000 mg/200 mL premix        1,000 mg 200 mL/hr over 60 Minutes Intravenous Every 12 hours 03/28/20 1048 03/28/20 1240   03/27/20 1146  vancomycin (VANCOCIN) powder  Status:  Discontinued          As needed 03/27/20 1147 03/27/20 1512   03/27/20 0900  vancomycin (VANCOCIN) IVPB 1000 mg/200 mL premix        1,000 mg 200 mL/hr over 60 Minutes Intravenous On call to O.R. 03/27/20 5956 03/28/20 1614    .  Recent vital signs:  Vitals:   04/02/20 0457 04/02/20 0732  BP: 130/69 (!) 149/70  Pulse: 68 67  Resp: 16 17  Temp: 99 F (37.2 C) 98.4 F (36.9 C)  SpO2: 98% 98%    Recent  laboratory studies:  Results for orders placed or performed during the hospital encounter of 03/27/20  Surgical pcr screen   Specimen: Nasal Mucosa; Nasal Swab  Result Value Ref Range   MRSA, PCR NEGATIVE NEGATIVE   Staphylococcus aureus POSITIVE (A) NEGATIVE  SARS Coronavirus 2 by RT PCR (hospital order, performed in [facility] hospital lab) Nasopharyngeal Nasopharyngeal Swab   Specimen: Nasopharyngeal Swab  Result Value Ref Range   SARS Coronavirus 2 NEGATIVE NEGATIVE  CBC  Result Value Ref Range   WBC 8.4 4.0 - 10.5 K/uL   RBC 4.96 3.87 - 5.11 MIL/uL   Hemoglobin 13.9 12.0 - 15.0 g/dL   HCT 38.7 36 - 46 %   MCV 84.9 80.0 - 100.0 fL   MCH 28.0 26.0 - 34.0 pg   MCHC 33.0 30.0 - 36.0 g/dL   RDW 56.4 33.2 - 95.1 %   Platelets 390 150 - 400 K/uL   nRBC 0.0 0.0 - 0.2 %  Basic metabolic panel  Result Value Ref Range   Sodium 138 135 - 145 mmol/L   Potassium 3.6 3.5 - 5.1 mmol/L   Chloride 101 98 - 111 mmol/L   CO2 25 22 - 32 mmol/L   Glucose, Bld 111 (H) 70 - 99 mg/dL   BUN 17 8 - 23 mg/dL   Creatinine, Ser 8.84 (  H) 0.44 - 1.00 mg/dL   Calcium 9.5 8.9 - 10.2 mg/dL   GFR calc non Af Amer 39 (L) >60 mL/min   GFR calc Af Amer 45 (L) >60 mL/min   Anion gap 12 5 - 15    Discharge Medications:   Allergies as of 04/03/2020      Reactions   Penicillins Rash   Childhood       Medication List    STOP taking these medications   gabapentin 100 MG capsule Commonly known as: NEURONTIN   HYDROcodone-acetaminophen 5-325 MG tablet Commonly known as: NORCO/VICODIN     TAKE these medications   albuterol 108 (90 Base) MCG/ACT inhaler Commonly known as: VENTOLIN HFA Inhale 2 puffs into the lungs every 4 (four) hours as needed for wheezing or shortness of breath.   amphetamine-dextroamphetamine 20 MG tablet Commonly known as: ADDERALL Take 20 mg by mouth 2 (two) times daily.   aspirin 81 MG chewable tablet Chew 1 tablet (81 mg total) by mouth daily.   buPROPion 150 MG 24 hr  tablet Commonly known as: WELLBUTRIN XL Take 150 mg by mouth at bedtime.   cyproheptadine 4 MG tablet Commonly known as: PERIACTIN Take 4 mg by mouth 3 (three) times daily.   LORazepam 1 MG tablet Commonly known as: ATIVAN Take 1 tablet (1 mg total) by mouth every 12 (twelve) hours as needed for anxiety. Do not take with Narcotic medications. Must choose between one or the other What changed:   when to take this  additional instructions   methocarbamol 500 MG tablet Commonly known as: ROBAXIN Take 1 tablet (500 mg total) by mouth every 8 (eight) hours as needed for muscle spasms.   oxyCODONE-acetaminophen 5-325 MG tablet Commonly known as: Percocet Take 1 tablet by mouth every 4 (four) hours as needed for severe pain.   traZODone 100 MG tablet Commonly known as: DESYREL Take 300 mg by mouth at bedtime.   venlafaxine XR 150 MG 24 hr capsule Commonly known as: EFFEXOR-XR Take 150 mg by mouth daily with breakfast.       Diagnostic Studies: DG Knee Left Port  Result Date: 03/27/2020 CLINICAL DATA:  Status post left knee surgery. EXAM: PORTABLE LEFT KNEE - 1-2 VIEW COMPARISON:  Radiograph 02/07/2020 FINDINGS: Left knee arthroplasty in expected alignment. There are no periprosthetic lucencies or fractures. There has been patellar resurfacing. Recent postsurgical change includes air and edema in the soft tissues and joint space. Calcified densities posteriorly may represent intra-articular bodies within a Baker cyst, also present on prior exam. IMPRESSION: 1. Left knee arthroplasty without immediate postoperative complication. 2. Possible intra-articular bodies posteriorly within a Baker cyst, also present on prior exam. Electronically Signed   By: Narda Rutherford M.D.   On: 03/27/2020 17:01    Disposition:  SNF      Signed: [name] 04/02/2020, 11:40 AM

## 2020-04-02 NOTE — Progress Notes (Signed)
Pt has been educated the usage and purpose of CPM, but pt refused.

## 2020-04-02 NOTE — TOC Progression Note (Signed)
Transition of Care [facility]) - Progression Note    Patient Details  Name: ADEOLA DENNEN MRN: [id number] Date of Birth: [birth year]  Transition of Care [facility]) CM/SW [place], RN Phone Number: 04/02/2020, 12:03 PM  Clinical Narrative:    Case management received insurance authorization back on the patient confirming approval and admission to [facility] SNF from 7/16-7/20/2021 - Auth # [id number] - clinical reviews can be sent to [place] at [facility] at [phone].  I met with the patient at the bedside to inform her that she was accepted and could transfer to the facility, [facility] today via ambulance transport.  The patient was irritated and demanding that she not transfer today that she needed to go home first with the assitance of Hubbard Hartshorn, and collect clothes from home for her stay.  Annie Main was at bedside during this discussion and will place discharge summary for tomorrow so that the patient could transfer via ambulance or car.  I called and left a message with Hubbard Hartshorn, friend, so that she understood that the patient would be transferring to the SNF tomorrow after speaking with her on the phone to coordinate collecting personal items from home.  Plan is to discharge to the facility - [facility] - or discharge to home if patient refuses.  Will continue to follow for a safe discharge plan.   Expected Discharge Plan: Skilled Nursing Facility Barriers to Discharge: Unsafe home situation (lives alone)  Expected Discharge Plan and Services Expected Discharge Plan: [place]   Discharge Planning Services: CM Consult Post Acute Care Choice: [place] Living arrangements for the past 2 months: [place] Determinants of Health (SDOH) Interventions    Readmission Risk Interventions No flowsheet data found.

## 2020-04-02 NOTE — Progress Notes (Signed)
Physical Therapy Treatment Patient Details Name: [name] MRN: [id number] DOB: 04/06/1951 Today's Date: 04/02/2020    History of Present Illness Pt is a 69 y.o. F with significant PMH of back surgery, R rotator cuff repair, osteoporosis, left hip replacement, right knee surgery who presents with left knee osteoarthritis s/p left total knee arthroplasty 03/27/2020.    PT Comments    Patient upset upon PT arrival about events that took place with SW earlier in afternoon. "I am so upset I don't know what I can do with you." Tolerated there ex. Knee AROM measured ~3-90 degrees. Tolerated gait training with min guard assist for balance/safety. Cues needed for left quad activation during stance phase. Encouraged zero degree knee foam to improve overall knee extension. Pt slightly impulsive with mobility. Will continue to follow.    Follow Up Recommendations  SNF     Equipment Recommendations  3in1 (PT)    Recommendations for Other Services       Precautions / Restrictions Precautions Precautions: Fall;Knee Precaution Booklet Issued: No Precaution Comments: Reviewed precautions Restrictions Weight Bearing Restrictions: Yes LLE Weight Bearing: Weight bearing as tolerated    Mobility  Bed Mobility Overal bed mobility: Needs Assistance Bed Mobility: Supine to Sit;Sit to Supine     Supine to sit: Supervision Sit to supine: Supervision   General bed mobility comments: Supervision for transition, no physical assist required  Transfers Overall transfer level: Needs assistance Equipment used: Rolling walker (2 wheeled) Transfers: Sit to/from Stand Sit to Stand: Min guard         General transfer comment: Pt able to rise into standing without assistance. Min guard for safety.  Ambulation/Gait Ambulation/Gait assistance: Min guard Gait Distance (Feet): 65 Feet Assistive device: Rolling walker (2 wheeled) Gait Pattern/deviations: Step-to pattern;Trunk  flexed;Antalgic;Step-through pattern;Decreased stance time - left;Decreased step length - right Gait velocity: decreased   General Gait Details: Cues for left knee extension during stance phase and RW management.   Stairs             Wheelchair Mobility    Modified Rankin (Stroke Patients Only)       Balance Overall balance assessment: Needs assistance Sitting-balance support: Feet supported;No upper extremity supported Sitting balance-Leahy Scale: Good Sitting balance - Comments: Able to donn socks sitting EOB reaching outside BoS without LOB.   Standing balance support: During functional activity Standing balance-Leahy Scale: Poor Standing balance comment: reliant on external support                            Cognition Arousal/Alertness: Awake/alert Behavior During Therapy: WFL for tasks assessed/performed;Impulsive Overall Cognitive Status: No family/caregiver present to determine baseline cognitive functioning                                 General Comments: Erratic behavior; pt upset that she is being d/ced today without being able to return home first.      Exercises Total Joint Exercises Quad Sets: AROM;Left;5 reps;Supine Knee Flexion: AROM;Left;5 reps;Seated (20 second hold) Goniometric ROM: Seated knee AROM 3-90 degrees    General Comments        Pertinent Vitals/Pain Pain Assessment: No/denies pain    Home Living                      Prior Function  PT Goals (current goals can now be found in the care plan section) Progress towards PT goals: Progressing toward goals    Frequency    7X/week      PT Plan Current plan remains appropriate    Co-evaluation              AM-PAC PT "6 Clicks" Mobility   Outcome Measure  Help needed turning from your back to your side while in a flat bed without using bedrails?: None Help needed moving from lying on your back to sitting on the side of a  flat bed without using bedrails?: None Help needed moving to and from a bed to a chair (including a wheelchair)?: A Little Help needed standing up from a chair using your arms (e.g., wheelchair or bedside chair)?: A Little Help needed to walk in hospital room?: A Little Help needed climbing 3-5 steps with a railing? : A Little 6 Click Score: 20    End of Session Equipment Utilized During Treatment: Gait belt Activity Tolerance: Patient tolerated treatment well (self limiting due to being upset about d/c) Patient left: in bed;with call bell/phone within reach;with bed alarm set Nurse Communication: Mobility status PT Visit Diagnosis: Difficulty in walking, not elsewhere classified (R26.2);Other abnormalities of gait and mobility (R26.89)     Time: 1150-1210 PT Time Calculation (min) (ACUTE ONLY): 20 min  Charges:  $Therapeutic Activity: 8-22 mins                     Vale Haven, PT, DPT Acute Rehabilitation Services Pager [phone] Office [phone]       Blake Divine A Lanier Ensign 04/02/2020, 3:52 PM

## 2020-04-02 NOTE — Plan of Care (Signed)

## 2020-04-02 NOTE — TOC Progression Note (Signed)
Transition of Care [facility]) - Progression Note    Patient Details  Name: [name] MRN: [id number] Date of Birth: 09-Aug-1951  Transition [facility] Care [facility]) CM/SW Contact  Janae Bridgeman, RN Phone Number: 04/02/2020, 2:17 PM  Clinical Narrative:    Case management spoke with friend, Chestine Spore, on the phone to confirm that the patient's would transfer to [facility] tomorrow around 11 am so that the patient's friend could be in the room for support to the patient and alleviate any stressors or confusion for the patient.   Expected Discharge Plan: Skilled Nursing Facility Barriers to Discharge: Unsafe home situation (lives alone)  Expected Discharge Plan and Services Expected Discharge Plan: Skilled Nursing Facility   Discharge Planning Services: CM Consult Post Acute Care Choice: Skilled Nursing Facility Living arrangements for the past 2 months: Boarding House                                       Social Determinants of Health (SDOH) Interventions    Readmission Risk Interventions No flowsheet data found.

## 2020-04-03 MED ORDER — OXYCODONE HCL 5 MG PO TABS: 5.0000 mg | ORAL_TABLET | ORAL | 0 refills | Status: DC | PRN

## 2020-04-03 MED ORDER — LORAZEPAM 1 MG PO TABS: 1.0000 mg | ORAL_TABLET | Freq: Two times a day (BID) | ORAL | 0 refills | Status: DC | PRN

## 2020-04-03 MED ORDER — METHOCARBAMOL 500 MG PO TABS: 500.0000 mg | ORAL_TABLET | Freq: Three times a day (TID) | ORAL | 0 refills | Status: DC | PRN

## 2020-04-03 MED ORDER — OXYCODONE-ACETAMINOPHEN 5-325 MG PO TABS: 1.0000 | ORAL_TABLET | ORAL | 0 refills | Status: DC | PRN

## 2020-04-03 MED ORDER — ASPIRIN 81 MG PO CHEW: 81.0000 mg | CHEWABLE_TABLET | Freq: Every day | ORAL | 0 refills | Status: DC

## 2020-04-03 NOTE — Progress Notes (Signed)
Report given to [facility], stafff nurse at [facility]. All questions were answered.

## 2020-04-03 NOTE — Progress Notes (Signed)
Discharge summary packet/documents provided to [facility] staff. Pt alert /oriented in no apparent distress. No complains voiced.Pt has to be transported to [facility].

## 2020-04-03 NOTE — TOC Transition Note (Signed)
Transition of Care [facility]) - CM/SW Discharge Note   Patient Details  Name: [name] MRN: [id number] Date of Birth: 05/13/1951  [facility] CM/SW Contact:  Janae Bridgeman, RN Phone Number: 04/03/2020, 2:00 PM   Clinical Narrative:    Case Management obtained Ativan prescription and changes to the discharge summary prior to calling [name] for transport to [facility].  The patient and friend, Tresa Endo, were notified of the patient's transfer with [name] to the facility.  Discharge summary updated version was updated and sent to [facility]   Final next level of care: Skilled Nursing Facility Barriers to Discharge: Unsafe home situation (lives alone)   Patient Goals and CMS Choice Patient states their goals for this hospitalization and ongoing recovery are:: Patient requests to go to SNF Rehab CMS Medicare.gov Compare Post Acute Care list provided to:: Patient Choice offered to / list presented to : Patient  Discharge Placement                       Discharge Plan and Services   Discharge Planning Services: CM Consult Post Acute Care Choice: Skilled Nursing Facility                               Social Determinants of Health (SDOH) Interventions     Readmission Risk Interventions No flowsheet data found.

## 2020-04-03 NOTE — Care Management Important Message (Signed)
Important Message  Patient Details  Name: [name] MRN: [id number] Date of Birth: October 10, 1950   Medicare Important Message Given:  Yes     Dorena Bodo 04/03/2020, 2:20 PM

## 2020-04-03 NOTE — Plan of Care (Signed)
  Problem: Education: Goal: Knowledge of General Education information will improve Description: Including pain rating scale, medication(s)/side effects and non-pharmacologic comfort measures Outcome: Adequate for Discharge   Problem: Health Behavior/Discharge Planning: Goal: Ability to manage health-related needs will improve Outcome: Adequate for Discharge   Problem: Activity: Goal: Risk for activity intolerance will decrease Outcome: Adequate for Discharge   Problem: Nutrition: Goal: Adequate nutrition will be maintained Outcome: Adequate for Discharge   Problem: Coping: Goal: Level of anxiety will decrease Outcome: Adequate for Discharge   Problem: Pain Managment: Goal: General experience of comfort will improve Outcome: Adequate for Discharge

## 2020-04-03 NOTE — Plan of Care (Signed)
  Problem: Pain Managment: Goal: General experience of comfort will improve Outcome: Progressing   Problem: Safety: Goal: Ability to remain free from injury will improve Outcome: Progressing   Problem: Skin Integrity: Goal: Risk for impaired skin integrity will decrease Outcome: Progressing   

## 2020-04-11 ENCOUNTER — Ambulatory Visit (INDEPENDENT_AMBULATORY_CARE_PROVIDER_SITE_OTHER): Payer: Medicaid Other | Admitting: Orthopedic Surgery

## 2020-04-11 ENCOUNTER — Ambulatory Visit: Payer: Self-pay

## 2020-04-11 DIAGNOSIS — Z96652 Presence of left artificial knee joint: Secondary | ICD-10-CM

## 2020-04-13 ENCOUNTER — Telehealth: Payer: Self-pay | Admitting: Orthopedic Surgery

## 2020-04-13 ENCOUNTER — Other Ambulatory Visit: Payer: Self-pay | Admitting: Surgical

## 2020-04-13 MED ORDER — OXYCODONE-ACETAMINOPHEN 5-325 MG PO TABS: 1.0000 | ORAL_TABLET | Freq: Three times a day (TID) | ORAL | 0 refills | Status: DC | PRN

## 2020-04-13 NOTE — Telephone Encounter (Signed)
Pls advise.  

## 2020-04-13 NOTE — Telephone Encounter (Signed)
Patient called requesting pain medications. Patient states facility she is being discharged from has not sent in or gave written prescription for pain medication. Patient asking for prescription to go to [facility] in [address] and [address]. Patient phone number is [phone].

## 2020-04-13 NOTE — Telephone Encounter (Signed)
submitted

## 2020-04-15 ENCOUNTER — Encounter: Payer: Self-pay | Admitting: Orthopedic Surgery

## 2020-04-15 NOTE — Progress Notes (Signed)
Post-Op Visit Note   Patient: [name]           Date of Birth: 11/24/1950           MRN: [id number] Visit Date: 04/11/2020 PCP: Paulino Door, MD   Assessment & Plan:  Chief Complaint:  Chief Complaint  Patient presents with   Post-op Follow-up   Visit Diagnoses:  1. Status post total left knee replacement     Plan: Patient is a 69 year old female presents s/p left total knee arthroplasty on 03/27/2020.  She has been in a skilled nursing facility and has been receiving physical therapy at the facility.  She is not using a CPM machine.  She does note a lot of swelling and redness around the left knee.  She has a large postoperative left knee effusion with surrounding erythema to the lateral side of the left knee incision.  This redness is nontender on exam.  Redness completely resolves with lifting the left knee above the level of patient's heart.  Impression is that this is not cellulitis or postoperative infection but merely dependent rubor.  We will continue to monitor at her next office visit.  Recommended that she return to the office immediately if she develops increasing pain, fevers, chills, drainage, further redness.  Patient understands and agrees with plan.  On exam patient has 10 degrees extension and greater than 120 degrees of flexion.  She has no calf tenderness on exam and a negative Homans' sign.  She has been taking 5 mg of oxycodone every 12 hours as needed.  Plan to start outpatient physical therapy 1 time a week for 6 weeks.  Specifically want her to focus on knee extension as she has excellent flexion at this point.  Patient understands and I also demonstrated how to work on knee extension when she is at home.  Patient understands the need for getting the leg straight.  Follow-up in 4 weeks for clinical recheck.  Radiographs of the left knee reviewed with the patient today and show a well-positioned left knee prosthesis in good alignment.  No periprosthetic fracture  or any lucency surrounding the prosthesis.  Follow-Up Instructions: No follow-ups on file.   Orders:  Orders Placed This Encounter  Procedures   XR Knee 1-2 Views Left   Ambulatory referral to Physical Therapy   No orders of the defined types were placed in this encounter.   Imaging: No results found.  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    Class: Chronic   Pulmonary nodule 02/27/2012    Class: Chronic   Drug-seeking behavior 02/25/2011   ROTATOR CUFF REPAIR, RIGHT, HX OF 03/27/2010   HYPERLIPIDEMIA 02/21/2010   NECK PAIN, CHRONIC 11/17/2007   DEPRESSION 11/11/2007   OSTEOPOROSIS 11/11/2007   Past Medical History:  Diagnosis Date   Anxiety    Arthritis    back, joints   Asthma    Chronic pain    Constipation    Depression    Dysrhythmia    Palpitations   GERD (gastroesophageal reflux disease)    High cholesterol    Hypertension    resolved whn she stopped drinking alcohol   Mitral prolapse    dx as a child   Osteopenia    Osteoporosis     No family history on file.  Past  Surgical History:  Procedure Laterality Date   ABDOMINAL HYSTERECTOMY  1985   BACK SURGERY     BREAST SURGERY     augmentation   HARDWARE REMOVAL  02/27/2012   Procedure: HARDWARE REMOVAL;  Surgeon: Kerrin Champagne, MD;  Location: [facility] OR;  Service: Orthopedics;;  exploration and removal of portion of screw at T-!   JOINT REPLACEMENT Right    shoulder   left hip replacement  02-27-12   just screw - no replacement   NECK SURGERY     plates and screws   ORIF WRIST FRACTURE  1986   right   right femur replacement     ROTATOR CUFF REPAIR     right   SHOULDER HEMI-ARTHROPLASTY  02/27/2012   Procedure: SHOULDER HEMI-ARTHROPLASTY;  Surgeon: Kerrin Champagne, MD;  Location: [facility] OR;  Service:  Orthopedics;  Laterality: Right;  Right shoulder hemiarthroplasty, repair right rotator cuff   TONSILLECTOMY     TOTAL KNEE ARTHROPLASTY Left 03/27/2020   TOTAL KNEE ARTHROPLASTY Left 03/27/2020   Procedure: LEFT TOTAL KNEE ARTHROPLASTY;  Surgeon: Cammy Copa, MD;  Location: [facility] OR;  Service: Orthopedics;  Laterality: Left;   WRIST SURGERY     tendon repair from a dog bite- left   Social History   Occupational History    Employer: UNEMPLOYED  Tobacco Use   Smoking status: Current Every Day Smoker    Packs/day: 0.50    Years: 50.00    Pack years: 25.00    Types: Cigarettes   Smokeless tobacco: Never Used  Building services engineer Use: Never used  Substance and Sexual Activity   Alcohol use: Yes    Comment: ocasionally   Drug use: No    Comment: quit in 1998   Sexual activity: Not on file

## 2020-04-17 ENCOUNTER — Telehealth: Payer: Self-pay | Admitting: Orthopedic Surgery

## 2020-04-17 NOTE — Telephone Encounter (Signed)
Patient called.   Post op. She is concerned because she hasn't heard from her home health agency yet.  Call back: [phone]

## 2020-04-17 NOTE — Telephone Encounter (Signed)
IC Dorene Sorrow with [facility]. He will have their scheduler reach out to patient about getting set up. Initially after surgery patient was sent to SNF.

## 2020-04-18 ENCOUNTER — Other Ambulatory Visit: Payer: Self-pay | Admitting: Surgical

## 2020-04-18 ENCOUNTER — Telehealth: Payer: Self-pay | Admitting: Orthopedic Surgery

## 2020-04-18 MED ORDER — OXYCODONE-ACETAMINOPHEN 5-325 MG PO TABS: 1.0000 | ORAL_TABLET | Freq: Three times a day (TID) | ORAL | 0 refills | Status: DC | PRN

## 2020-04-18 NOTE — Telephone Encounter (Signed)
Pt called asking for a refill of her oxycodone-tylenol.

## 2020-04-18 NOTE — Telephone Encounter (Signed)
submitted

## 2020-04-18 NOTE — Telephone Encounter (Signed)
Pls advise.  

## 2020-04-19 ENCOUNTER — Telehealth: Payer: Self-pay | Admitting: Radiology

## 2020-04-19 ENCOUNTER — Telehealth: Payer: Self-pay

## 2020-04-19 ENCOUNTER — Other Ambulatory Visit: Payer: Self-pay | Admitting: Surgical

## 2020-04-19 ENCOUNTER — Telehealth: Payer: Self-pay | Admitting: Specialist

## 2020-04-19 MED ORDER — OXYCODONE-ACETAMINOPHEN 5-325 MG PO TABS: 1.0000 | ORAL_TABLET | Freq: Three times a day (TID) | ORAL | 0 refills | Status: AC | PRN

## 2020-04-19 NOTE — Telephone Encounter (Signed)
Patient called advised she is in a lot of pain. Patient said she is suppose to have the hard ware removed within 5 years and it's time to have it removed per pt. Patient said her neck,shoulder and back  Is hurting so bad. The number to contact patient is [phone]

## 2020-04-19 NOTE — Telephone Encounter (Signed)
Received a phone call from [place] with [facility] stating that they have had some issues with patients behavior towards the physical therapist. There have been several incidents where patient is very argumentative, screaming, with threatening behavior. They agreed to see patient once more time today to see if things improved. PT Jae Dire went to patients home this afternoon and patient was not there. They called patient and she began screaming and yelling stating she was not there that she had gone to the ER to get pain medication for her shoulder. She said that she could not get help from anyone because everyone seemed to be incompetent. She told Jae Dire that she should just leave her the exercises that she should do for her knee. The therapist advised she was not allowed to go in her home without her being there. Patient became very irate again stating how incompetent everyone was and it was ridiculous she could not get the care that she needed. Dorene Sorrow advised that unfortunately at this time, they will not be able to go back out to the patients home for HHPT due to feeling unsafe and patients threatening behavior. I tried calling patient to discuss no answer.

## 2020-04-19 NOTE — Telephone Encounter (Signed)
Ok no hhpt for her thx

## 2020-04-19 NOTE — Telephone Encounter (Signed)
Pls advise.  

## 2020-04-19 NOTE — Telephone Encounter (Signed)
Patient called, very difficult to understand patient she was constantly crying, and needed her medication changed.  Stated she had surgery done by Dr. August Saucer on her knee and that she is in need of hardware removal in her shoulders and is in extremely bad pain with her shoulders, neck and back, she is also a patient of Dr. Barbaraann Faster as well.  Talked with patient until she could stop crying to where I could understand what she was calling about. The refill yesterday of oxycodone was sent into pharmacy with instructions of "take 1 tab every 8hrs" she needs it changed to "take 1-2 tabs every 8hrs" for them to refill medication today, if not she cannot have medication until the 8/8 and she is completely out of pain medication today.  Stated she took previous Rx the same as when she left the hospital taking 2 pills and did not pay attention to instructions from last refill. Patient began crying again, apologizing for taking too many.  Please advise. CB# [phone]

## 2020-04-19 NOTE — Telephone Encounter (Signed)
I s/w patient and advised  

## 2020-04-26 ENCOUNTER — Telehealth: Payer: Self-pay

## 2020-04-26 NOTE — Telephone Encounter (Signed)
Pls advise.  

## 2020-04-26 NOTE — Telephone Encounter (Signed)
Patient called stating that she is having some problems with her left knee and that she has tried the exercises that she had from rehab when she was in the hospital.  Stated that she has not seen anyone for PT and would like a Rx refill on her pain medicine.  CB# [phone].  Please advise.  Thank you.

## 2020-04-27 ENCOUNTER — Telehealth: Payer: Self-pay | Admitting: Orthopedic Surgery

## 2020-04-27 NOTE — Telephone Encounter (Signed)
I called.

## 2020-04-27 NOTE — Telephone Encounter (Signed)
Patient called.   Did not disclose why but she is requesting a call back from her provider.   Call back: [phone]

## 2020-04-27 NOTE — Telephone Encounter (Signed)
I called and talked to [place].  She did raise her voice when I told her about what was in the record in terms of what it transpired with the past home health person.  She wanted more home health and I said that might be difficult but typically at this point people transition to outpatient therapy.  I think outpatient therapy would be best if she can arrange transportation but she really does not want any more treatment at this time.  She was doing quite well at the last clinic visit and her x-rays did look good.  At this point in time it is going to be difficult to find any provider who will be able to help her manage any deficits she has left with the knee at this time.  Fortunately the knee lid look good at last clinic visit.  She has other issues which may need to be addressed by other providers in the future in terms of her shoulder.

## 2020-05-02 ENCOUNTER — Telehealth: Payer: Self-pay

## 2020-05-02 NOTE — Telephone Encounter (Signed)
Patient called stating that she is in a lot of pain with her right shoulder and would like to see Dr. Otelia Sergeant.  Advised her that he doesn't have anything available, but she could see his PA Zonia Kief.  Stated that she would like to see Dr. Otelia Sergeant.  Would like a call back to see what he can advise her to do about her right shoulder?  Cb# [phone].  Please advise.  Thank you

## 2020-05-02 NOTE — Telephone Encounter (Signed)
I called and advised her to use OTC meds for the pain, and she can use ice on it, and sched her for 9/23 @ 845 with [place]

## 2020-05-07 ENCOUNTER — Telehealth: Payer: Self-pay | Admitting: Orthopedic Surgery

## 2020-05-07 ENCOUNTER — Telehealth: Payer: Self-pay

## 2020-05-07 NOTE — Telephone Encounter (Signed)
IC patient. Spoke with her and advised that she would indeed need pre med prior to dental work but that Dr August Saucer does not recommend any dental work unless it is life threatening until she is at least 3 months out from knee replacement surgery.  She stated yelling stating it was life threatening because she had broken 2 front teeth and it needed to be done ASAP and that we needed to just go ahead and send her medication in now! I advised patient I could relay her concern to provider but it was not our place to dictate to them what would be done. Patients started yelling louder stating that she would just call her other orthopedist to take care of this and hung up the phone.

## 2020-05-07 NOTE — Telephone Encounter (Signed)
I spoke with [name] and he does not seem to remember this being part of the plan they had for patient. Last OV note stated we would follow up with her in 4 weeks and she does have an appt scheduled to see Dr August Saucer on the 30th.

## 2020-05-07 NOTE — Telephone Encounter (Signed)
Patient needs her records sent to Dr. Jordan Likes in [facility] ---record release-? Email to [email or web address] for her to complete. --I advised that I would have to discuss with the medical records dept and see if they can be sent or if we need to get a record release signed by her to send them. I advised that I would let her know what I find out.

## 2020-05-07 NOTE — Telephone Encounter (Signed)
Were you all sending her to a different pain management?

## 2020-05-07 NOTE — Telephone Encounter (Signed)
Patient called in wanting to ask questions regarding going to a pain management facility  in [place].

## 2020-05-07 NOTE — Telephone Encounter (Signed)
Received vm from patient. Stating she had dental appt and they wouldn't do any work,they told her she need antibiotic before treatment. Please call [phone]

## 2020-05-08 NOTE — Telephone Encounter (Signed)
I called and lmom that we would be emailing the record release form to her to complete.

## 2020-05-09 ENCOUNTER — Telehealth: Payer: Self-pay | Admitting: Specialist

## 2020-05-09 NOTE — Telephone Encounter (Signed)
I called and advised her to look in her junk mail that it could have went there, I advised that this was actually sent by Tammy in medical records.  She states that she will look there.

## 2020-05-09 NOTE — Telephone Encounter (Signed)
Patient would like a call back from [name]. Patient states she can not find email from [place]. Please call patient at  [phone].

## 2020-05-14 ENCOUNTER — Telehealth: Payer: Self-pay | Admitting: Specialist

## 2020-05-14 ENCOUNTER — Ambulatory Visit: Payer: Medicaid Other | Admitting: Orthopedic Surgery

## 2020-05-14 NOTE — Telephone Encounter (Signed)
Patient had TKA on Left knee with Dr. August Saucer on 03/27/2020--should she follow up with him? She is wanting you to see her for the knee.---Please advise

## 2020-05-14 NOTE — Telephone Encounter (Signed)
Patient called. She would like Christy to call her. [phone]. She would like to see Dr. Otelia Sergeant before 9/23 and would like to see him about her knee.

## 2020-05-15 ENCOUNTER — Telehealth: Payer: Self-pay

## 2020-05-15 NOTE — Telephone Encounter (Signed)
Patient called  Patient is scheduled to have oral surgery and needs antibiotics to be prescribed to her. Patient wants advice on next step if medications cant be prescribed.   Call back:[phone]

## 2020-05-16 NOTE — Telephone Encounter (Signed)
Advise that she not have oral surgery so soon after knee replacement but I will send in abx if she is set on having her procedure done

## 2020-05-16 NOTE — Telephone Encounter (Signed)
Since Dr. August Saucer did her replacement can he advise on this?

## 2020-05-16 NOTE — Telephone Encounter (Signed)
Please advise. Thanks.  

## 2020-05-17 NOTE — Telephone Encounter (Signed)
Tried calling patient. No answer. I LMVM advising in detail per below .

## 2020-05-17 NOTE — Telephone Encounter (Signed)
I will not see her in follow up of the total knee replacement since she is less than 3 months since surgery. Needs to see her surgeon During the post op follow up.

## 2020-05-17 NOTE — Telephone Encounter (Signed)
I tried all numbers for pt but there was no answer for any number, I will try again later

## 2020-05-22 NOTE — Telephone Encounter (Signed)
I called and lmo advising her of Dr. Barbaraann Faster message below

## 2020-05-24 ENCOUNTER — Other Ambulatory Visit: Payer: Self-pay | Admitting: Surgical

## 2020-05-24 ENCOUNTER — Telehealth: Payer: Self-pay

## 2020-05-24 MED ORDER — DOXYCYCLINE HYCLATE 50 MG PO CAPS
100.0000 mg | ORAL_CAPSULE | Freq: Once | ORAL | 0 refills | Status: AC
Start: 2020-05-24 — End: 2020-05-24

## 2020-05-24 NOTE — Telephone Encounter (Signed)
Patient called very argumentative on the phone about the need for having dental work for her two broken teeth. She was yelling on the phone stating that we have not taken good care of her and her needs. I advised that she and I had talked numerous times about this issue and that you strongly recommended against having any dental work unless life threatening until she was at least 3 months out from knee replacement surgery. She states we did not even send HHPT to her home. I advised her that this was untrue and documented in her chart. I was trying to let patient know that I could have one of you call to discuss but patient was very angry, yelling and patient hung up on me.

## 2020-05-24 NOTE — Telephone Encounter (Signed)
Tried calling. No answer. No VM to LM.  

## 2020-05-24 NOTE — Telephone Encounter (Signed)
Patient has PCN allergy. Please submit appropriate abx. Thanks.

## 2020-05-24 NOTE — Telephone Encounter (Signed)
Ok for dental work if teeth are broken needs preop abx thx for dealing with this and her sorry so difficult

## 2020-05-24 NOTE — Telephone Encounter (Signed)
Sent in RX

## 2020-06-07 ENCOUNTER — Ambulatory Visit: Payer: Medicaid Other | Admitting: Specialist

## 2020-07-02 ENCOUNTER — Telehealth: Payer: Self-pay

## 2020-07-02 NOTE — Telephone Encounter (Signed)
Needs appointment if having continued pain. Too far out from procedure for narcotic refill

## 2020-07-02 NOTE — Telephone Encounter (Signed)
Patient called she is requesting a prescription refill for oxycodone  Call back:[phone]

## 2020-07-02 NOTE — Telephone Encounter (Signed)
Please advise. Thanks.  

## 2020-07-03 ENCOUNTER — Other Ambulatory Visit: Payer: Self-pay | Admitting: Specialist

## 2020-07-03 NOTE — Telephone Encounter (Signed)
IC no answer. LMVM with details advising per below.  

## 2020-07-03 NOTE — Telephone Encounter (Signed)
Pt called stating she would like to have a refill of the hydrocodone rx Dr.Nitka sent in for her shoulder and pt wanted to state for the record that she was not asking Dr.Dean for a pain rx earlier today; pt was very adamant about this being mentioned.   [phone]

## 2020-07-04 ENCOUNTER — Other Ambulatory Visit: Payer: Self-pay | Admitting: Specialist

## 2020-07-04 ENCOUNTER — Telehealth: Payer: Self-pay | Admitting: Specialist

## 2020-07-04 MED ORDER — TRAMADOL-ACETAMINOPHEN 37.5-325 MG PO TABS: 1.0000 | ORAL_TABLET | Freq: Four times a day (QID) | ORAL | 0 refills | Status: AC | PRN

## 2020-07-04 NOTE — Telephone Encounter (Signed)
I called and lmom for pt that Dr. Otelia Sergeant denied the refill of Hydrocodone but he did send in Ultracet (Tramadol with Tylenol) in for her.

## 2020-07-04 NOTE — Telephone Encounter (Signed)
Placed on the cancellation list

## 2020-07-04 NOTE — Telephone Encounter (Signed)
Patient called asked if she can be on the wait list for a sooner appointment  Patient said her arm is black and blue     The number to contact patient is [phone]

## 2020-07-05 ENCOUNTER — Ambulatory Visit: Payer: Medicaid Other | Admitting: Specialist

## 2020-07-16 ENCOUNTER — Telehealth: Payer: Self-pay | Admitting: Specialist

## 2020-07-16 NOTE — Telephone Encounter (Signed)
FYI Patient called advised she has not slept in 5 nights because of the pain she is in. Patient said she do not want to see anyone else but Dr Otelia Sergeant because Dr Otelia Sergeant did her surgery.  Advised patient she is on the wait list if anyone cancels their appointment. The number to contact patient if needed is [phone]

## 2020-07-16 NOTE — Telephone Encounter (Signed)
She is on my cancellation list, as soon as something opens I will call her.

## 2020-07-19 ENCOUNTER — Ambulatory Visit: Payer: Medicaid Other | Admitting: Specialist

## 2020-07-23 ENCOUNTER — Ambulatory Visit (INDEPENDENT_AMBULATORY_CARE_PROVIDER_SITE_OTHER): Payer: Medicare Other | Admitting: Specialist

## 2020-07-23 ENCOUNTER — Encounter: Payer: Self-pay | Admitting: Specialist

## 2020-07-23 ENCOUNTER — Ambulatory Visit: Payer: Self-pay

## 2020-07-23 ENCOUNTER — Other Ambulatory Visit: Payer: Self-pay

## 2020-07-23 VITALS — BP 146/75 | HR 77 | Ht 69.0 in | Wt 119.0 lb

## 2020-07-23 DIAGNOSIS — M25552 Pain in left hip: Secondary | ICD-10-CM

## 2020-07-23 DIAGNOSIS — M6281 Muscle weakness (generalized): Secondary | ICD-10-CM

## 2020-07-23 DIAGNOSIS — T8484XA Pain due to internal orthopedic prosthetic devices, implants and grafts, initial encounter: Secondary | ICD-10-CM

## 2020-07-23 DIAGNOSIS — M7062 Trochanteric bursitis, left hip: Secondary | ICD-10-CM | POA: Diagnosis not present

## 2020-07-23 DIAGNOSIS — Z96611 Presence of right artificial shoulder joint: Secondary | ICD-10-CM | POA: Diagnosis not present

## 2020-07-23 DIAGNOSIS — M75121 Complete rotator cuff tear or rupture of right shoulder, not specified as traumatic: Secondary | ICD-10-CM

## 2020-07-23 DIAGNOSIS — M19011 Primary osteoarthritis, right shoulder: Secondary | ICD-10-CM

## 2020-07-23 DIAGNOSIS — M1612 Unilateral primary osteoarthritis, left hip: Secondary | ICD-10-CM

## 2020-07-23 DIAGNOSIS — M25511 Pain in right shoulder: Secondary | ICD-10-CM | POA: Diagnosis not present

## 2020-07-23 MED ORDER — BUPIVACAINE HCL 0.5 % IJ SOLN
3.0000 mL | INTRAMUSCULAR | Status: AC | PRN
  Administered 2020-07-23: 3 mL via INTRA_ARTICULAR

## 2020-07-23 MED ORDER — METHYLPREDNISOLONE ACETATE 40 MG/ML IJ SUSP
40.0000 mg | INTRAMUSCULAR | Status: AC | PRN
  Administered 2020-07-23: 40 mg via INTRA_ARTICULAR

## 2020-07-23 NOTE — Patient Instructions (Signed)
Plan: Avoid overhead lifting and overhead use of the arms. Pillows to keep from sleeping directly on the shoulders Limited lifting to less than 10 lbs. Ice or heat for relief. NSAIDs are helpful, such as alleve or motrin, be careful not to use in excess as they place burdens on the kidney. Stretching exercise help and strengthening is helpful to build endurance. Referral to Dr. Jones Broom, [facility], shoulder specialist for consideration of revision of the Right shoulder hemiarthroplasty to a reverse total shoulder arthroplasty(replacment) CT scan of the right shoulder to assess bone stock for right shoulder evaluation.  Ice pack for the left hip site of injection and arthritis medication like advil for pain.

## 2020-07-23 NOTE — Progress Notes (Signed)
Office Visit Note   Patient: [name]           Date of Birth: March 17, 1951           MRN: [id number] Visit Date: 07/23/2020              Requested by: Paulino Door, MD [address] [place],  [place] [address] PCP: Paulino Door, MD   Assessment & Plan: Visit Diagnoses:  1. Right shoulder pain, unspecified chronicity   2. Pain in left hip     Plan: Avoid overhead lifting and overhead use of the arms. Pillows to keep from sleeping directly on the shoulders Limited lifting to less than 10 lbs. Ice or heat for relief. NSAIDs are helpful, such as alleve or motrin, be careful not to use in excess as they place burdens on the kidney. Stretching exercise help and strengthening is helpful to build endurance. Referral to Dr. Jones Broom, [facility], shoulder specialist for consideration of revision of the Right shoulder hemiarthroplasty to a reverse total shoulder arthroplasty(replacment) CT scan of the right shoulder to assess bone stock for right shoulder evaluation.  Ice pack for the left hip site of injection and arthritis medication like advil for pain.    Follow-Up Instructions: No follow-ups on file.   Orders:  Orders Placed This Encounter  Procedures  . XR Shoulder Right  . XR HIP UNILAT W OR W/O PELVIS 2-3 VIEWS LEFT   No orders of the defined types were placed in this encounter.     Procedures: Large Joint Inj: L greater trochanter on 07/23/2020 12:42 PM Indications: pain Details: 22 G 3.5 in needle, superolateral approach  Arthrogram: No  Medications: 40 mg methylPREDNISolone acetate 40 MG/ML; 3 mL bupivacaine 0.5 % Outcome: tolerated well, no immediate complications Procedure, treatment alternatives, risks and benefits explained, specific risks discussed. Consent was given by the patient. Immediately prior to procedure a time out was called to verify the correct patient, procedure, equipment, support staff and site/side marked as  required. Patient was prepped and draped in the usual sterile fashion.       Clinical Data: No additional findings.   Subjective: Chief Complaint  Patient presents with  . Right Shoulder - Follow-up, Pain  . Left Hip - Follow-up, Pain    69 year old right handed female with history of previous right shoulder hemiarthroplasty of fracture about 4.5 years ago. She has had  Left knee replacement surgery by Dr. August Saucer 03/2020 And previous right hip replacement done in 2018 by Dr. Romana Juniper with [facility]. She has been having pain over the last several months. Dr. Elesa Massed told her of left shoulder rotator cuff tears. She is having pian into the left hip that is present, not with sleeping, there is some pain with walking on the left hip. No recent fall, no fall since about 2 year ago when she broke the right hip and had a right hip replacement. There is some numbness in the outer aspect of the hands since neck surgery a while back in [place].   Review of Systems  Constitutional: Positive for unexpected weight change (staying at 120 lbs). Negative for activity change, appetite change, chills, diaphoresis, fatigue and fever.  HENT: Positive for hearing loss. Negative for congestion, dental problem, drooling, ear discharge, ear pain, facial swelling, mouth sores, nosebleeds, postnasal drip, rhinorrhea, sinus pressure, sinus pain, sneezing, sore throat, tinnitus, trouble swallowing and voice change.   Eyes: Negative.  Negative for photophobia,  pain, discharge, redness, itching and visual disturbance.  Respiratory: Positive for cough. Negative for apnea, choking, chest tightness, shortness of breath, wheezing and stridor.   Cardiovascular: Negative.   Gastrointestinal: Positive for abdominal distention, abdominal pain and nausea. Negative for vomiting (awakening at 2-3 AM with).  Endocrine: Positive for heat intolerance. Negative for cold intolerance.  Genitourinary: Negative.  Negative for  difficulty urinating, dyspareunia, dysuria, enuresis, flank pain, frequency, pelvic pain and urgency.  Musculoskeletal: Positive for back pain and gait problem. Negative for arthralgias, joint swelling, myalgias, neck pain and neck stiffness.  Skin: Negative.  Negative for color change, pallor, rash and wound.  Allergic/Immunologic: Negative.  Negative for environmental allergies, food allergies and immunocompromised state.  Neurological: Negative for dizziness, tremors, seizures, syncope, facial asymmetry, speech difficulty, weakness, light-headedness, numbness and headaches.  Hematological: Negative for adenopathy. Does not bruise/bleed easily.  Psychiatric/Behavioral: Negative for agitation, behavioral problems, confusion, decreased concentration, dysphoric mood, hallucinations, self-injury, sleep disturbance and suicidal ideas. The patient is not nervous/anxious and is not hyperactive.      Objective: Vital Signs: BP (!) 146/75 (BP Location: Left Arm, Patient Position: Sitting)   Pulse 77   Ht 5\' 9"  (1.753 m)   Wt 119 lb (54 kg)   BMI 17.57 kg/m   Physical Exam Constitutional:      Appearance: She is well-developed.  HENT:     Head: Normocephalic and atraumatic.  Eyes:     Pupils: Pupils are equal, round, and reactive to light.  Pulmonary:     Effort: Pulmonary effort is normal.     Breath sounds: Normal breath sounds.  Abdominal:     General: Bowel sounds are normal.     Palpations: Abdomen is soft.  Musculoskeletal:        General: Normal range of motion.     Cervical back: Normal range of motion and neck supple.  Skin:    General: Skin is warm and dry.  Neurological:     Mental Status: She is alert and oriented to person, place, and time.  Psychiatric:        Behavior: Behavior normal.        Thought Content: Thought content normal.        Judgment: Judgment normal.     Ortho Exam  Specialty Comments:  No specialty comments available.  Imaging: No results  found.   PMFS History: Patient Active Problem List   Diagnosis Date Noted  . Humeral surgical neck fracture 02/27/2012    Priority: High    Class: Acute  . 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/27/2012    Priority: Medium    Class: Chronic  . Pulmonary nodule 02/27/2012    Priority: Medium    Class: Chronic  . Arthritis of left knee 03/27/2020  . Drug-seeking behavior 02/25/2011  . ROTATOR CUFF REPAIR, RIGHT, HX OF 03/27/2010  . HYPERLIPIDEMIA 02/21/2010  . NECK PAIN, CHRONIC 11/17/2007  . DEPRESSION 11/11/2007  . OSTEOPOROSIS 11/11/2007   Past Medical History:  Diagnosis Date  . Anxiety   . Arthritis    back, joints  . Asthma   . Chronic pain   . Constipation   . Depression   . Dysrhythmia    Palpitations  . GERD (gastroesophageal reflux disease)   . High cholesterol   . Hypertension    resolved whn she stopped drinking alcohol  . Mitral prolapse    dx as a child  . Osteopenia   . Osteoporosis     History reviewed. No pertinent family  history.  Past Surgical History:  Procedure Laterality Date  . ABDOMINAL HYSTERECTOMY  1985  . BACK SURGERY    . BREAST SURGERY     augmentation  . HARDWARE REMOVAL  02/27/2012   Procedure: HARDWARE REMOVAL;  Surgeon: Kerrin Champagne, MD;  Location: [facility] OR;  Service: Orthopedics;;  exploration and removal of portion of screw at T-!  . JOINT REPLACEMENT Right    shoulder  . left hip replacement  02-27-12   just screw - no replacement  . NECK SURGERY     plates and screws  . ORIF WRIST FRACTURE  1986   right  . right femur replacement    . ROTATOR CUFF REPAIR     right  . SHOULDER HEMI-ARTHROPLASTY  02/27/2012   Procedure: SHOULDER HEMI-ARTHROPLASTY;  Surgeon: Kerrin Champagne, MD;  Location: [facility] OR;  Service: Orthopedics;  Laterality: Right;  Right shoulder hemiarthroplasty, repair right rotator cuff  . TONSILLECTOMY    . TOTAL KNEE ARTHROPLASTY Left 03/27/2020  . TOTAL KNEE ARTHROPLASTY Left 03/27/2020   Procedure: LEFT TOTAL KNEE  ARTHROPLASTY;  Surgeon: Cammy Copa, MD;  Location: [facility] OR;  Service: Orthopedics;  Laterality: Left;  . WRIST SURGERY     tendon repair from a dog bite- left   Social History   Occupational History    Employer: UNEMPLOYED  Tobacco Use  . Smoking status: Current Every Day Smoker    Packs/day: 0.50    Years: 50.00    Pack years: 25.00    Types: Cigarettes  . Smokeless tobacco: Never Used  Vaping Use  . Vaping Use: Never used  Substance and Sexual Activity  . Alcohol use: Yes    Comment: ocasionally  . Drug use: No    Comment: quit in 1998  . Sexual activity: Not on file

## 2020-07-30 ENCOUNTER — Telehealth: Payer: Self-pay | Admitting: Specialist

## 2020-07-30 NOTE — Telephone Encounter (Signed)
Patient wants something for pain, she states that she has been in severe pain, and she cannot go another night the way she did last night, she was upset that none of the Drs. Want to give pain meds anymore, and she stated that she wanted to get a hold of the DEA and let them know that they are hurting people not helping them as people need the meds.  I tried to get her an appt with Fayrene Fearing and she said "james can't do anything more than Dr. Otelia Sergeant has already"  She said she just wants something for pain, I advised that I would send her request to Dr. Otelia Sergeant

## 2020-07-30 NOTE — Telephone Encounter (Signed)
Pt called and her shoulder is hurting so bad and it is going into her neck.Marland Kitchen   and her left hip she cant bare weight on that leg anymore.  pt states last week she was walking fine with her walker she isn't sure what happened

## 2020-08-02 ENCOUNTER — Ambulatory Visit: Payer: Medicaid Other | Admitting: Specialist

## 2020-08-08 ENCOUNTER — Other Ambulatory Visit (HOSPITAL_BASED_OUTPATIENT_CLINIC_OR_DEPARTMENT_OTHER): Payer: Medicare Other

## 2020-08-15 ENCOUNTER — Telehealth: Payer: Self-pay | Admitting: Specialist

## 2020-08-15 NOTE — Telephone Encounter (Signed)
Pt called stating Dr.Nitka wanted her to have a PET scan of her shoulder but when she called to set the appt they told her they didn't have the referral. Pt would like to have the referral sent to [facility] hospital and she would like to be notified when this is done.   [phone]

## 2020-08-15 NOTE — Telephone Encounter (Signed)
Note says CT scan but looks like order was cancelled due to patient refusal. I will ask Dr. Otelia Sergeant

## 2020-08-16 NOTE — Telephone Encounter (Signed)
Per Dr. Chestine Spore to see Dr. Ave Filter first

## 2020-08-17 NOTE — Telephone Encounter (Signed)
I called and advised patient that we did not order a PET scan that it was a CT scan and that she refused it so it was cancelled, However since she has been referred to Dr. Ave Filter, Dr. Otelia Sergeant would like her to see him before any tests are done at the point so he can order the specific test that he wants her to have.  She states that she did not refuse the test and the is "some bull" and she states that she sees Dr. Ave Filter today.

## 2020-08-20 ENCOUNTER — Telehealth: Payer: Self-pay | Admitting: Specialist

## 2020-08-20 ENCOUNTER — Ambulatory Visit: Payer: Medicaid Other | Admitting: Specialist

## 2020-08-20 NOTE — Telephone Encounter (Signed)
I tried to call but got a recording " your call can not be completed at this time, Thank you"----Per Dr. Otelia Sergeant we have not given her anything for pain, and his is not planning to start now as she is seeing Dr. Ave Filter for her shoulder. --I will try to call again later

## 2020-08-20 NOTE — Telephone Encounter (Signed)
Patient called requesting pain medication for shoulder pain. Please send to pharmacy on file. Patient phone number is [phone]. Please send to pharmacy on file

## 2020-08-21 NOTE — Telephone Encounter (Signed)
I tried to call but got a recording " your call can not be completed at this time, Thank you"----   Will try to call one more time again later

## 2020-08-23 NOTE — Telephone Encounter (Signed)
I got the same message.  If she calls back she needs to get pain meds for her shoulder from Dr. Ave Filter.

## 2020-08-31 ENCOUNTER — Telehealth: Payer: Self-pay | Admitting: Specialist

## 2020-08-31 NOTE — Telephone Encounter (Signed)
Pt called wanting to know how much longer she will have to deal with her shoulder pain; she states Dr.Nitka won't prescribe her anymore pain medicine and it's getting unbearable at this point.   [phone]

## 2020-09-03 NOTE — Telephone Encounter (Signed)
I called and lmom for pt that per JN she should contact Dr. Ave Filter about the pain as we referred her to him and he is treating er shoulder.

## 2020-10-04 ENCOUNTER — Telehealth: Payer: Self-pay

## 2020-10-04 NOTE — Telephone Encounter (Signed)
Can you please send the record release?

## 2020-10-04 NOTE — Telephone Encounter (Signed)
Patient called she is requesting a medical release form to be emailed to her so she can get her xray of her right shoulder. Email:[email or web address] [phone]

## 2020-10-04 NOTE — Telephone Encounter (Signed)
Release form emailed 

## 2020-11-12 ENCOUNTER — Telehealth: Payer: Self-pay | Admitting: Specialist

## 2020-11-12 NOTE — Telephone Encounter (Signed)
Received vm from patient, needing her records to be sent to another office "Dr. Aundria Rud"? IC,lmvm [phone] advised need to sign authorization form. Advised to come in to office or if can mail to her to just left [place] know.

## 2020-11-13 ENCOUNTER — Telehealth: Payer: Self-pay | Admitting: Specialist

## 2020-11-13 NOTE — Telephone Encounter (Signed)
Received call from patient. I emailed release form to her [email or web address]

## 2020-11-13 NOTE — Telephone Encounter (Signed)
Patient called back stating hasn't gotten email, asked to please just mail the release form. I mailed [address] [place] [address]

## 2020-11-30 ENCOUNTER — Other Ambulatory Visit: Payer: Self-pay | Admitting: Orthopedic Surgery

## 2020-11-30 DIAGNOSIS — M25511 Pain in right shoulder: Secondary | ICD-10-CM

## 2021-04-20 ENCOUNTER — Other Ambulatory Visit: Payer: Self-pay

## 2021-04-20 ENCOUNTER — Emergency Department (HOSPITAL_BASED_OUTPATIENT_CLINIC_OR_DEPARTMENT_OTHER)
Admission: EM | Admit: 2021-04-20 | Discharge: 2021-04-21 | Disposition: A | Discharge status: Home / Self Care | Payer: Medicare Other | Attending: Emergency Medicine | Admitting: Emergency Medicine

## 2021-04-20 ENCOUNTER — Encounter (HOSPITAL_BASED_OUTPATIENT_CLINIC_OR_DEPARTMENT_OTHER): Payer: Self-pay | Admitting: *Deleted

## 2021-04-20 DIAGNOSIS — Z96652 Presence of left artificial knee joint: Secondary | ICD-10-CM | POA: Diagnosis not present

## 2021-04-20 DIAGNOSIS — M25552 Pain in left hip: Secondary | ICD-10-CM | POA: Insufficient documentation

## 2021-04-20 DIAGNOSIS — F17210 Nicotine dependence, cigarettes, uncomplicated: Secondary | ICD-10-CM | POA: Diagnosis not present

## 2021-04-20 DIAGNOSIS — S40011A Contusion of right shoulder, initial encounter: Secondary | ICD-10-CM | POA: Diagnosis not present

## 2021-04-20 DIAGNOSIS — M25559 Pain in unspecified hip: Secondary | ICD-10-CM

## 2021-04-20 DIAGNOSIS — X58XXXA Exposure to other specified factors, initial encounter: Secondary | ICD-10-CM | POA: Diagnosis not present

## 2021-04-20 DIAGNOSIS — G8929 Other chronic pain: Secondary | ICD-10-CM | POA: Diagnosis not present

## 2021-04-20 DIAGNOSIS — Z96611 Presence of right artificial shoulder joint: Secondary | ICD-10-CM | POA: Diagnosis not present

## 2021-04-20 DIAGNOSIS — I10 Essential (primary) hypertension: Secondary | ICD-10-CM | POA: Diagnosis not present

## 2021-04-20 DIAGNOSIS — Z96642 Presence of left artificial hip joint: Secondary | ICD-10-CM | POA: Insufficient documentation

## 2021-04-20 DIAGNOSIS — J45909 Unspecified asthma, uncomplicated: Secondary | ICD-10-CM | POA: Insufficient documentation

## 2021-04-20 DIAGNOSIS — S4991XA Unspecified injury of right shoulder and upper arm, initial encounter: Secondary | ICD-10-CM | POA: Diagnosis present

## 2021-04-20 MED ORDER — METHOCARBAMOL 500 MG PO TABS: 500.0000 mg | ORAL_TABLET | Freq: Three times a day (TID) | ORAL | 0 refills | Status: DC | PRN

## 2021-04-20 MED ORDER — LIDOCAINE 5 % EX PTCH: 1.0000 | MEDICATED_PATCH | CUTANEOUS | 0 refills | Status: DC

## 2021-04-20 MED ORDER — NAPROXEN 250 MG PO TABS
500.0000 mg | ORAL_TABLET | Freq: Once | ORAL | Status: DC
  Filled 2021-04-20: qty 2

## 2021-04-20 NOTE — Discharge Instructions (Addendum)
You were evaluated in the Emergency Department and after careful evaluation, we did not find any emergent condition requiring admission or further testing in the hospital.  Your exam/testing today was overall reassuring.  Recommend continued use of Tylenol or Motrin.  Can also use the Lidoderm patches and or the Robaxin muscle relaxer for more significant pain.  Recommend follow-up with the orthopedic specialist.  Please return to the Emergency Department if you experience any worsening of your condition.  Thank you for allowing [place] to be a part of your care.

## 2021-04-20 NOTE — ED Provider Notes (Signed)
[facility] EMERGENCY [facility] Emergency Department Provider Note MRN:  [id number]  Arrival date & time: 04/20/21     Chief Complaint   Pain   History of Present Illness   [name] is a 70 y.o. year-old female with a history of depression, anxiety, hypertension presenting to the ED with chief complaint of hip pain.  Patient is endorsing left hip pain and right shoulder pain.  These are chronic issues pain is worse over the past week.  Denies any new trauma.  She is told that she has a "stress fracture" in her left hip.  She has a history of shoulder replacement on the right.  Denies any fever, no chest pain or shortness of breath, no abdominal pain, no rash, no other complaints.  Pain is constant, worse with motion or palpation.  Review of Systems  A complete 10 system review of systems was obtained and all systems are negative except as noted in the HPI and PMH.   Patient's Health History    Past Medical History:  Diagnosis Date   Anxiety    Arthritis    back, joints   Asthma    Chronic pain    Constipation    Depression    Dysrhythmia    Palpitations   GERD (gastroesophageal reflux disease)    High cholesterol    Hypertension    resolved whn she stopped drinking alcohol   Mitral prolapse    dx as a child   Osteopenia    Osteoporosis     Past Surgical History:  Procedure Laterality Date   ABDOMINAL HYSTERECTOMY  1985   BACK SURGERY     BREAST SURGERY     augmentation   HARDWARE REMOVAL  02/27/2012   Procedure: HARDWARE REMOVAL;  Surgeon: Kerrin Champagne, MD;  Location: [facility] OR;  Service: Orthopedics;;  exploration and removal of portion of screw at T-!   JOINT REPLACEMENT Right    shoulder   left hip replacement  02-27-12   just screw - no replacement   NECK SURGERY     plates and screws   ORIF WRIST FRACTURE  1986   right   right femur replacement     ROTATOR CUFF REPAIR     right   SHOULDER HEMI-ARTHROPLASTY  02/27/2012   Procedure: SHOULDER  HEMI-ARTHROPLASTY;  Surgeon: Kerrin Champagne, MD;  Location: [facility] OR;  Service: Orthopedics;  Laterality: Right;  Right shoulder hemiarthroplasty, repair right rotator cuff   TONSILLECTOMY     TOTAL KNEE ARTHROPLASTY Left 03/27/2020   TOTAL KNEE ARTHROPLASTY Left 03/27/2020   Procedure: LEFT TOTAL KNEE ARTHROPLASTY;  Surgeon: Cammy Copa, MD;  Location: [facility] OR;  Service: Orthopedics;  Laterality: Left;   WRIST SURGERY     tendon repair from a dog bite- left    History reviewed. No pertinent family history.  Social History   Socioeconomic History   Marital status: Single    Spouse name: Not on file   Number of children: 1   Years of education: Not on file   Highest education level: Not on file  Occupational History    Employer: UNEMPLOYED  Tobacco Use   Smoking status: Every Day    Packs/day: 0.50    Years: 50.00    Pack years: 25.00    Types: Cigarettes   Smokeless tobacco: Never  Vaping Use   Vaping Use: Never used  Substance and Sexual Activity   Alcohol use: Yes    Comment: ocasionally  Drug use: No    Comment: quit in 1998   Sexual activity: Not on file  Other Topics Concern   Not on file  Social History Narrative   Not on file   Social Determinants of Health   Financial Resource Strain: Not on file  Food Insecurity: Not on file  Transportation Needs: Not on file  Physical Activity: Not on file  Stress: Not on file  Social Connections: Not on file  Intimate Partner Violence: Not on file     Physical Exam   Vitals:   04/20/21 2226 04/20/21 2318  BP: (!) 145/80 (!) 153/85  Pulse: 70 63  Resp: 18 18  Temp:    SpO2: 100% 100%    CONSTITUTIONAL: Chronically ill-appearing, NAD NEURO:  Alert and oriented x 3, no focal deficits EYES:  eyes equal and reactive ENT/NECK:  no LAD, no JVD CARDIO: Regular rate, well-perfused, normal S1 and S2 PULM:  CTAB no wheezing or rhonchi GI/GU:  normal bowel sounds, non-distended, non-tender MSK/SPINE:  No gross  deformities, no edema SKIN:  no rash, atraumatic PSYCH:  Appropriate speech and behavior  *Additional and/or pertinent findings included in MDM below  Diagnostic and Interventional Summary    EKG Interpretation  Date/Time:    Ventricular Rate:    PR Interval:    QRS Duration:   QT Interval:    QTC Calculation:   R Axis:     Text Interpretation:         Labs Reviewed - No data to display  No orders to display    Medications  naproxen (NAPROSYN) tablet 500 mg (has no administration in time range)     Procedures  /  Critical Care Procedures  ED Course and Medical Decision Making  I have reviewed the triage vital signs, the nursing notes, and pertinent available records from the EMR.  Listed above are laboratory and imaging tests that I personally ordered, reviewed, and interpreted and then considered in my medical decision making (see below for details).  Patient has bruising to the right shoulder that she says has been there for 6 months.  Patient has normal range of motion of the right shoulder, normal range of motion of the left hip, there is no erythema, no increased warmth, no fever, nothing to suggest a septic joint.  No new trauma recently.  Still x-rays were recommended but patient declines these imaging studies.  She is mostly here for pain control.  Since Tylenol and Motrin are not working.  There is documentation of drug-seeking behavior in the past.  Discharging home on Robaxin and Lidoderm patches, advised orthopedic follow-up.       Elmer Sow. Pilar Plate, MD [facility] Emergency Medicine [facility] [email or web address]  Final Clinical Impressions(s) / ED Diagnoses     ICD-10-CM   1. Hip pain  M25.559     2. Chronic right shoulder pain  M25.511    G89.29       ED Discharge Orders          Ordered    lidocaine (LIDODERM) 5 %  Every 24 hours        04/20/21 2322    methocarbamol (ROBAXIN) 500 MG tablet  Every 8 hours PRN        04/20/21  2322             Discharge Instructions Discussed with and Provided to Patient:    Discharge Instructions      You were evaluated in the  Emergency Department and after careful evaluation, we did not find any emergent condition requiring admission or further testing in the hospital.  Your exam/testing today was overall reassuring.  Recommend continued use of Tylenol or Motrin.  Can also use the Lidoderm patches and or the Robaxin muscle relaxer for more significant pain.  Recommend follow-up with the orthopedic specialist.  Please return to the Emergency Department if you experience any worsening of your condition.  Thank you for allowing [place] to be a part of your care.        Sabas Sous, MD 04/20/21 2325

## 2021-04-20 NOTE — ED Notes (Addendum)
Attempted to discharge patient. She states that she would not like to leave until she can get something else for pain. MD notified. Pt states she needs a cab to be called but has money for it.

## 2021-04-20 NOTE — ED Triage Notes (Signed)
Per EMS: pt from home. Generalized pain-specifically in left hip and right shoulder. No new injury. Vss.

## 2021-04-21 DIAGNOSIS — S40011A Contusion of right shoulder, initial encounter: Secondary | ICD-10-CM | POA: Diagnosis not present

## 2021-04-21 MED ORDER — HYDROCODONE-ACETAMINOPHEN 5-325 MG PO TABS
1.0000 | ORAL_TABLET | Freq: Once | ORAL | Status: AC
  Administered 2021-04-21: 1 via ORAL
  Filled 2021-04-21: qty 1

## 2021-05-24 ENCOUNTER — Emergency Department (HOSPITAL_COMMUNITY): Payer: Medicare Other

## 2021-05-24 ENCOUNTER — Other Ambulatory Visit: Payer: Self-pay

## 2021-05-24 ENCOUNTER — Encounter (HOSPITAL_COMMUNITY): Payer: Self-pay

## 2021-05-24 ENCOUNTER — Emergency Department (HOSPITAL_COMMUNITY)
Admission: EM | Admit: 2021-05-24 | Discharge: 2021-05-24 | Disposition: A | Discharge status: Home / Self Care | Payer: Medicare Other | Attending: Emergency Medicine | Admitting: Emergency Medicine

## 2021-05-24 DIAGNOSIS — Z96642 Presence of left artificial hip joint: Secondary | ICD-10-CM | POA: Diagnosis not present

## 2021-05-24 DIAGNOSIS — R0781 Pleurodynia: Secondary | ICD-10-CM | POA: Diagnosis not present

## 2021-05-24 DIAGNOSIS — R112 Nausea with vomiting, unspecified: Secondary | ICD-10-CM | POA: Diagnosis not present

## 2021-05-24 DIAGNOSIS — Z7982 Long term (current) use of aspirin: Secondary | ICD-10-CM | POA: Insufficient documentation

## 2021-05-24 DIAGNOSIS — R1012 Left upper quadrant pain: Secondary | ICD-10-CM | POA: Insufficient documentation

## 2021-05-24 DIAGNOSIS — Z96652 Presence of left artificial knee joint: Secondary | ICD-10-CM | POA: Diagnosis not present

## 2021-05-24 DIAGNOSIS — S22000A Wedge compression fracture of unspecified thoracic vertebra, initial encounter for closed fracture: Secondary | ICD-10-CM

## 2021-05-24 DIAGNOSIS — Z96611 Presence of right artificial shoulder joint: Secondary | ICD-10-CM | POA: Diagnosis not present

## 2021-05-24 DIAGNOSIS — J45909 Unspecified asthma, uncomplicated: Secondary | ICD-10-CM | POA: Insufficient documentation

## 2021-05-24 DIAGNOSIS — I10 Essential (primary) hypertension: Secondary | ICD-10-CM | POA: Insufficient documentation

## 2021-05-24 DIAGNOSIS — R109 Unspecified abdominal pain: Secondary | ICD-10-CM

## 2021-05-24 DIAGNOSIS — F17210 Nicotine dependence, cigarettes, uncomplicated: Secondary | ICD-10-CM | POA: Diagnosis not present

## 2021-05-24 LAB — COMPREHENSIVE METABOLIC PANEL
ALT: 11 U/L (ref 0–44)
AST: 19 U/L (ref 15–41)
Albumin: 4 g/dL (ref 3.5–5.0)
Alkaline Phosphatase: 65 U/L (ref 38–126)
Anion gap: 9 (ref 5–15)
BUN: 19 mg/dL (ref 8–23)
CO2: 22 mmol/L (ref 22–32)
Calcium: 9.3 mg/dL (ref 8.9–10.3)
Chloride: 105 mmol/L (ref 98–111)
Creatinine, Ser: 0.69 mg/dL (ref 0.44–1.00)
GFR, Estimated: >60 mL/min (ref >60)
Glucose, Bld: 102 mg/dL — ABNORMAL HIGH (ref 70–99)
Potassium: 4.3 mmol/L (ref 3.5–5.1)
Sodium: 136 mmol/L (ref 135–145)
Total Bilirubin: 0.2 mg/dL — ABNORMAL LOW (ref 0.3–1.2)
Total Protein: 7 g/dL (ref 6.5–8.1)

## 2021-05-24 LAB — CBC
HCT: 37.9 % (ref 36.0–46.0)
Hemoglobin: 12.3 g/dL (ref 12.0–15.0)
MCH: 30.5 pg (ref 26.0–34.0)
MCHC: 32.5 g/dL (ref 30.0–36.0)
MCV: 94 fL (ref 80.0–100.0)
Platelets: 385 10*3/uL (ref 150–400)
RBC: 4.03 MIL/uL (ref 3.87–5.11)
RDW: 14.6 % (ref 11.5–15.5)
WBC: 8.3 10*3/uL (ref 4.0–10.5)
nRBC: 0 % (ref 0.0–0.2)

## 2021-05-24 LAB — URINALYSIS, MICROSCOPIC (REFLEX)

## 2021-05-24 LAB — URINALYSIS, ROUTINE W REFLEX MICROSCOPIC
Bilirubin Urine: NEGATIVE
Glucose, UA: NEGATIVE mg/dL
Ketones, ur: NEGATIVE mg/dL
Leukocytes,Ua: NEGATIVE
Nitrite: NEGATIVE
Protein, ur: NEGATIVE mg/dL
Specific Gravity, Urine: 1.02 (ref 1.005–1.030)
pH: 6 (ref 5.0–8.0)

## 2021-05-24 LAB — LIPASE, BLOOD: Lipase: 66 U/L — ABNORMAL HIGH (ref 11–51)

## 2021-05-24 MED ORDER — TRAMADOL HCL 50 MG PO TABS: 50.0000 mg | ORAL_TABLET | Freq: Three times a day (TID) | ORAL | 0 refills | Status: DC | PRN

## 2021-05-24 MED ORDER — OXYCODONE-ACETAMINOPHEN 5-325 MG PO TABS
1.0000 | ORAL_TABLET | Freq: Once | ORAL | Status: AC
  Administered 2021-05-24: 1 via ORAL
  Filled 2021-05-24: qty 1

## 2021-05-24 MED ORDER — METHOCARBAMOL 750 MG PO TABS: 750.0000 mg | ORAL_TABLET | Freq: Three times a day (TID) | ORAL | 0 refills | Status: DC | PRN

## 2021-05-24 NOTE — ED Notes (Signed)
[name] called 23 people in front of pt

## 2021-05-24 NOTE — Discharge Instructions (Addendum)
It was our pleasure to provide your ER care today - we hope that you feel better.  Your imaging tests show no rib fractures or acute abdominal process - note is made on your imaging of degenerative changes in your back/spine, as well as compression fractures - these findings may be related to the pain you have been having.   Take acetaminophen as need for pain. You may also try ultram as need for pain or robaxin as need for pain/spasm - no driving for the next 6 hours or if/when taking these meds.   Follow up with primary care doctor in one week if symptoms fail to improve/resolve.  Return to ER if worse, new symptoms, fevers, severe or intractable pain, persistent vomiting, trouble breathing, or other concern.

## 2021-05-24 NOTE — ED Notes (Signed)
Pt screamed "PERCOCET??!! THAT'S NOT GOING TO DO ANYTHING FOR MY PAIN." Explained to pt that she was receiving a narcotic for her pain and asked what she wanted. She yelled back "SOMETHING TO STOP THIS PAIN. I GRADUATED FROM RN [employer] IN 19??."  Pt then refused to sit up to take medication but took from supine position. No choking or aspiration noted while taking mediation from a supine position.

## 2021-05-24 NOTE — ED Notes (Signed)
Pt is very aggressive and demanding, wanting to see the Dr RIGHT NOW!!! Explained that Dr would be there as soon as he is able. That there are people that are literally dying that need to be seen before he can see her. Pt [place] very very unpleasant, still demanding to be seen right now.

## 2021-05-24 NOTE — ED Notes (Signed)
This Rn entered the room to discharge the pt, pt states she doesn't have a ride and has no one that could pick her up. Pt also requesting her prescriptions be sent to another pharmacy in [place]

## 2021-05-24 NOTE — ED Notes (Signed)
Pt is up walking around, pt is clearly able to walk, pt demanding to leave. Attempted to given pt discharge papers, pt very aggressively grabbed them out of my hand and ambulated out of the ED.
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
## 2021-05-24 NOTE — ED Triage Notes (Signed)
Pt c.o LUQ pain that radiates to her back with n/v last night.

## 2021-05-24 NOTE — Social Work (Signed)
CSW met with Pt at bedside. Pt states that she lives with an older lady and while there will be someone to let her in her residence, there is no one to pick her up.  Pt reports that she uses a walker at home and cannot ambulate on her own without it. CSW informed Pt that due to her condition transportation would be arranged via non-emergency ambulance and that it would take several hours at least. Pt asked to speak with provider, CSW will inform EDP.

## 2021-05-25 ENCOUNTER — Telehealth (HOSPITAL_BASED_OUTPATIENT_CLINIC_OR_DEPARTMENT_OTHER): Payer: Self-pay | Admitting: Emergency Medicine

## 2021-05-25 ENCOUNTER — Telehealth: Payer: Self-pay

## 2021-05-25 MED ORDER — METHOCARBAMOL 750 MG PO TABS: 750.0000 mg | ORAL_TABLET | Freq: Three times a day (TID) | ORAL | 0 refills | Status: DC | PRN

## 2021-05-25 MED ORDER — TRAMADOL HCL 50 MG PO TABS: 50.0000 mg | ORAL_TABLET | Freq: Three times a day (TID) | ORAL | 0 refills | Status: DC | PRN

## 2021-05-25 NOTE — Telephone Encounter (Signed)
Patient called in to say that medications were placed at the wrong pharmacy. She admitted that she ust moved to [place] 2 weeks ago, from [place], where the [employer] is. She is complaining that she is in too much paina and cannot get up Her preferred pharmacy is [facility] on [address]. Deleted the [place] pharmacy per request. Patient states she is going through mourning, as she just lost her only child. Messaged Dr Arizona Constable to see if he could change the medication to preferred pharmacy. Patient states she has to take a cab everywhere and she doesn't have money to spare, she also states that she felt she did not see the dcotor enough to know what is wrong with her. This RNCM told her that she would receive a call back shortly. About hte medications, but only the MD writing the prescriptions can change them. She said she would call her primary ic needed.

## 2021-05-25 NOTE — Telephone Encounter (Signed)
Patient requests previously prescribed meds be sent to different pharmacy - done.

## 2021-05-31 ENCOUNTER — Encounter (HOSPITAL_COMMUNITY): Payer: Self-pay

## 2021-05-31 ENCOUNTER — Emergency Department (HOSPITAL_COMMUNITY)
Admission: EM | Admit: 2021-05-31 | Discharge: 2021-05-31 | Disposition: A | Discharge status: Home / Self Care | Payer: Medicare Other | Attending: Emergency Medicine | Admitting: Emergency Medicine

## 2021-05-31 DIAGNOSIS — R109 Unspecified abdominal pain: Secondary | ICD-10-CM | POA: Insufficient documentation

## 2021-05-31 DIAGNOSIS — Z5321 Procedure and treatment not carried out due to patient leaving prior to being seen by health care provider: Secondary | ICD-10-CM | POA: Diagnosis not present

## 2021-05-31 LAB — CBC
HCT: 42.4 % (ref 36.0–46.0)
Hemoglobin: 13.5 g/dL (ref 12.0–15.0)
MCH: 30.3 pg (ref 26.0–34.0)
MCHC: 31.8 g/dL (ref 30.0–36.0)
MCV: 95.1 fL (ref 80.0–100.0)
Platelets: 542 10*3/uL — ABNORMAL HIGH (ref 150–400)
RBC: 4.46 MIL/uL (ref 3.87–5.11)
RDW: 14.7 % (ref 11.5–15.5)
WBC: 8.4 10*3/uL (ref 4.0–10.5)
nRBC: 0 % (ref 0.0–0.2)

## 2021-05-31 NOTE — ED Triage Notes (Signed)
Pt arrived via EMS, from home, c/o left sided flank pain x3 days. Was seen at [facility] but left before seeing provider.

## 2021-05-31 NOTE — ED Notes (Addendum)
This writer was only able to obtain CBC from blood draw attempt.

## 2021-06-29 ENCOUNTER — Emergency Department (HOSPITAL_COMMUNITY)
Admission: EM | Admit: 2021-06-29 | Discharge: 2021-06-29 | Disposition: A | Discharge status: Home / Self Care | Payer: Medicare Other | Attending: Emergency Medicine | Admitting: Emergency Medicine

## 2021-06-29 ENCOUNTER — Other Ambulatory Visit: Payer: Self-pay

## 2021-06-29 ENCOUNTER — Emergency Department (HOSPITAL_COMMUNITY): Payer: Medicare Other

## 2021-06-29 ENCOUNTER — Encounter (HOSPITAL_COMMUNITY): Payer: Self-pay

## 2021-06-29 DIAGNOSIS — Z96642 Presence of left artificial hip joint: Secondary | ICD-10-CM | POA: Insufficient documentation

## 2021-06-29 DIAGNOSIS — Z96651 Presence of right artificial knee joint: Secondary | ICD-10-CM | POA: Insufficient documentation

## 2021-06-29 DIAGNOSIS — J45909 Unspecified asthma, uncomplicated: Secondary | ICD-10-CM | POA: Insufficient documentation

## 2021-06-29 DIAGNOSIS — S20211A Contusion of right front wall of thorax, initial encounter: Secondary | ICD-10-CM | POA: Insufficient documentation

## 2021-06-29 DIAGNOSIS — S299XXA Unspecified injury of thorax, initial encounter: Secondary | ICD-10-CM | POA: Diagnosis present

## 2021-06-29 DIAGNOSIS — W19XXXA Unspecified fall, initial encounter: Secondary | ICD-10-CM | POA: Diagnosis not present

## 2021-06-29 DIAGNOSIS — Z7951 Long term (current) use of inhaled steroids: Secondary | ICD-10-CM | POA: Diagnosis not present

## 2021-06-29 DIAGNOSIS — Z96611 Presence of right artificial shoulder joint: Secondary | ICD-10-CM | POA: Diagnosis not present

## 2021-06-29 DIAGNOSIS — F17210 Nicotine dependence, cigarettes, uncomplicated: Secondary | ICD-10-CM | POA: Diagnosis not present

## 2021-06-29 DIAGNOSIS — I10 Essential (primary) hypertension: Secondary | ICD-10-CM | POA: Diagnosis not present

## 2021-06-29 MED ORDER — HYDROCODONE-ACETAMINOPHEN 5-325 MG PO TABS: 2.0000 | ORAL_TABLET | ORAL | 0 refills | Status: DC | PRN

## 2021-06-29 MED ORDER — HYDROCODONE-ACETAMINOPHEN 5-325 MG PO TABS
1.0000 | ORAL_TABLET | Freq: Once | ORAL | Status: AC
  Administered 2021-06-29: 1 via ORAL
  Filled 2021-06-29: qty 1

## 2021-06-29 MED ORDER — MECLIZINE HCL 12.5 MG PO TABS: 12.5000 mg | ORAL_TABLET | Freq: Three times a day (TID) | ORAL | 0 refills | Status: DC | PRN

## 2021-06-29 NOTE — ED Provider Notes (Signed)
[facility]-EMERGENCY DEPT Provider Note   CSN: [id number] Arrival date & time: 06/29/21  1257     History Chief Complaint  Patient presents with   Marletta Lor    ERSA DELANEY is a 70 y.o. female.  70 year old female presents had mechanical fall just prior to arrival.  Struck a door jam on the right side of her chest.  Did strike her head to but denies having loss of consciousness.  Does not take any blood thinners.  Denies any neck pain.      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   JOINT REPLACEMENT Right    shoulder   left hip replacement  02-27-12   just screw - no replacement   NECK SURGERY     plates and screws   ORIF WRIST FRACTURE  1986   right   right femur replacement     ROTATOR CUFF REPAIR     right   SHOULDER HEMI-ARTHROPLASTY  02/27/2012   Procedure:  SHOULDER HEMI-ARTHROPLASTY;  Surgeon: Kerrin Champagne, MD;  Location: [facility] OR;  Service: Orthopedics;  Laterality: Right;  Right shoulder hemiarthroplasty, repair right rotator cuff   TONSILLECTOMY     TOTAL KNEE ARTHROPLASTY Left 03/27/2020   TOTAL KNEE ARTHROPLASTY Left 03/27/2020   Procedure: LEFT TOTAL KNEE ARTHROPLASTY;  Surgeon: Cammy Copa, MD;  Location: [facility] OR;  Service: Orthopedics;  Laterality: Left;   WRIST SURGERY     tendon repair from a dog bite- left     OB History   No obstetric history on file.     No family history on file.  Social History   Tobacco Use   Smoking status: Every Day    Packs/day: 0.50    Years: 50.00    Pack years: 25.00    Types: Cigarettes   Smokeless tobacco: Never  Vaping Use   Vaping Use: Never used  Substance Use Topics   Alcohol use: Yes    Comment: ocasionally   Drug use: No    Comment: quit in 1998    Home Medications Prior to Admission medications   Medication Sig Start Date End Date Taking? Authorizing Provider  albuterol (PROVENTIL HFA;VENTOLIN HFA) 108 (90 BASE) MCG/ACT inhaler Inhale 2 puffs into the lungs every 4 (four) hours as needed for wheezing or shortness of breath.    [provider]  amphetamine-dextroamphetamine (ADDERALL) 20 MG tablet Take 20 mg by mouth 2 (two) times daily.     [provider]  aspirin 81 MG chewable tablet Chew 1 tablet (81 mg total) by mouth daily. 04/03/20   Magnant, Charles L, PA-C  buPROPion (WELLBUTRIN XL) 150 MG 24 hr tablet Take 150 mg by mouth at bedtime.    [provider]  celecoxib (CELEBREX) 200 MG capsule Take 200 mg by mouth daily. 05/14/21   [provider]  cyproheptadine (PERIACTIN) 4 MG tablet Take 4 mg by mouth 3 (three) times daily.    [provider]  lidocaine (LIDODERM) 5 % Place 1 patch onto the skin daily. Remove & Discard patch within 12 hours or as directed by MD 04/20/21   Sabas Sous, MD  LORazepam (ATIVAN) 1 MG tablet  Take 1 tablet (1 mg total) by mouth every 12 (twelve) hours as needed for anxiety. Do not take with Narcotic medications. Must choose between one or the other 04/03/20   Magnant, Joycie Peek, PA-C  methocarbamol (ROBAXIN) 750 MG tablet Take 1 tablet (750 mg total) by mouth 3 (three) times daily as needed (muscle spasm/pain). 05/25/21   Cathren Laine, MD  mirtazapine (REMERON) 15 MG tablet Take 15 mg by mouth at bedtime. 05/14/21   [provider]  MYRBETRIQ 25 MG TB24 tablet Take 25 mg by mouth daily. 04/08/21   [provider]  traMADol (ULTRAM) 50 MG tablet Take 1 tablet (50 mg total) by mouth every 8 (eight) hours as needed. 05/25/21   Cathren Laine, MD  traZODone (DESYREL) 100 MG tablet Take 300 mg by mouth at bedtime.     [provider]  venlafaxine XR (EFFEXOR-XR) 150 MG 24 hr capsule Take 150 mg by mouth daily with breakfast.    [provider]    Allergies    Vancomycin and Penicillins  Review of Systems   Review of Systems  All other systems reviewed and are negative.  Physical Exam Updated Vital Signs BP 133/78 (BP Location: Right Arm)   Pulse 84   Temp 98.1 F (36.7 C) (Oral)   Resp 16   Ht 1.727 m (5\' 8" )   Wt 57 kg   SpO2 98%   BMI 19.11 kg/m   Physical Exam Vitals and nursing note reviewed.  Constitutional:      General: She is not in acute distress.    Appearance: Normal appearance. She is well-developed. She is not toxic-appearing.  HENT:     Head: Normocephalic and atraumatic.  Eyes:     General: Lids are normal.     Conjunctiva/sclera: Conjunctivae normal.     Pupils: Pupils are equal, round, and reactive to light.  Neck:     Thyroid: No thyroid mass.     Trachea: No tracheal deviation.  Cardiovascular:     Rate and Rhythm: Normal rate and regular rhythm.     Heart sounds: Normal heart sounds. No murmur heard.   No gallop.  Pulmonary:     Effort: Pulmonary effort is normal. No respiratory distress.     Breath sounds:  Normal breath sounds. No stridor. No decreased breath sounds, wheezing, rhonchi or rales.  Chest:    Abdominal:     General: There is no distension.     Palpations:  Abdomen is soft.     Tenderness: There is no abdominal tenderness. There is no rebound.  Musculoskeletal:        General: No tenderness. Normal range of motion.     Cervical back: Normal range of motion and neck supple.  Skin:    General: Skin is warm and dry.     Findings: No abrasion or rash.  Neurological:     Mental Status: She is alert and oriented to person, place, and time. Mental status is at baseline.     GCS: GCS eye subscore is 4. GCS verbal subscore is 5. GCS motor subscore is 6.     Cranial Nerves: Cranial nerves are intact. No cranial nerve deficit.     Sensory: No sensory deficit.     Motor: Motor function is intact.  Psychiatric:        Attention and Perception: Attention normal.        Speech: Speech normal.        Behavior: Behavior normal.    ED Results / Procedures / Treatments   Labs (all labs ordered are listed, but only abnormal results are displayed) Labs Reviewed - No data to display  EKG None  Radiology No results found.  Procedures Procedures   Medications Ordered in ED Medications  HYDROcodone-acetaminophen (NORCO/VICODIN) 5-325 MG per tablet 1 tablet (has no administration in time range)    ED Course  I have reviewed the triage vital signs and the nursing notes.  Pertinent labs & imaging results that were available during my care of the patient were reviewed by me and considered in my medical decision making (see chart for details).    MDM Rules/Calculators/A&P                           Patient is x-rays negative here.  Medicated for pain and does feel better.  Will discharge home Final Clinical Impression(s) / ED Diagnoses Final diagnoses:  None    Rx / DC Orders ED Discharge Orders     None        Lorre Nick, MD 06/29/21 1452

## 2021-06-29 NOTE — ED Triage Notes (Signed)
Mechanical unwitnessed fall, at a motel pt reports she hit her right ribs and her head on the ground.  Pt was seen for a fall recently and report left rib pain.  Pt alert and oriented.  Skin noted to have bruising.

## 2021-06-30 ENCOUNTER — Emergency Department (HOSPITAL_COMMUNITY)
Admission: EM | Admit: 2021-06-30 | Discharge: 2021-06-30 | Disposition: A | Discharge status: Home / Self Care | Payer: Medicare Other | Attending: Emergency Medicine | Admitting: Emergency Medicine

## 2021-06-30 ENCOUNTER — Encounter (HOSPITAL_COMMUNITY): Payer: Self-pay

## 2021-06-30 ENCOUNTER — Other Ambulatory Visit: Payer: Self-pay

## 2021-06-30 DIAGNOSIS — I10 Essential (primary) hypertension: Secondary | ICD-10-CM | POA: Diagnosis not present

## 2021-06-30 DIAGNOSIS — W1830XA Fall on same level, unspecified, initial encounter: Secondary | ICD-10-CM | POA: Diagnosis not present

## 2021-06-30 DIAGNOSIS — Z96652 Presence of left artificial knee joint: Secondary | ICD-10-CM | POA: Insufficient documentation

## 2021-06-30 DIAGNOSIS — Z5900 Homelessness unspecified: Secondary | ICD-10-CM | POA: Diagnosis not present

## 2021-06-30 DIAGNOSIS — M25519 Pain in unspecified shoulder: Secondary | ICD-10-CM | POA: Diagnosis not present

## 2021-06-30 DIAGNOSIS — J45909 Unspecified asthma, uncomplicated: Secondary | ICD-10-CM | POA: Insufficient documentation

## 2021-06-30 DIAGNOSIS — Z7982 Long term (current) use of aspirin: Secondary | ICD-10-CM | POA: Diagnosis not present

## 2021-06-30 DIAGNOSIS — Z96611 Presence of right artificial shoulder joint: Secondary | ICD-10-CM | POA: Insufficient documentation

## 2021-06-30 DIAGNOSIS — R0789 Other chest pain: Secondary | ICD-10-CM

## 2021-06-30 DIAGNOSIS — Z96642 Presence of left artificial hip joint: Secondary | ICD-10-CM | POA: Insufficient documentation

## 2021-06-30 DIAGNOSIS — R079 Chest pain, unspecified: Secondary | ICD-10-CM | POA: Diagnosis present

## 2021-06-30 DIAGNOSIS — R0781 Pleurodynia: Secondary | ICD-10-CM | POA: Diagnosis not present

## 2021-06-30 DIAGNOSIS — F17210 Nicotine dependence, cigarettes, uncomplicated: Secondary | ICD-10-CM | POA: Diagnosis not present

## 2021-06-30 MED ORDER — METHOCARBAMOL 1000 MG/10ML IJ SOLN
500.0000 mg | Freq: Once | INTRAMUSCULAR | Status: AC
  Administered 2021-06-30: 500 mg via INTRAMUSCULAR
  Filled 2021-06-30: qty 5

## 2021-06-30 MED ORDER — METHOCARBAMOL 1000 MG/10ML IJ SOLN
1000.0000 mg | Freq: Once | INTRAMUSCULAR | Status: DC
  Filled 2021-06-30: qty 10

## 2021-06-30 MED ORDER — KETOROLAC TROMETHAMINE 60 MG/2ML IM SOLN
60.0000 mg | Freq: Once | INTRAMUSCULAR | Status: DC
  Filled 2021-06-30: qty 2

## 2021-06-30 NOTE — ED Provider Notes (Signed)
[facility]ENC[facility] DEPT Provider Note   CSN: [id number] Arrival date & time: 06/30/21  1534     History Chief Complaint  Patient presents with   Shoulder Pain    [name] is a 70 y.o. female.  70 year old female seen by me yesterday after having a mechanical fall.  Found to her right rib cage.  X-rays at that time were negative.  Prescribed medication.  Continues to note increasing pain worse with movement.  Denies any new injuries.  I prescribed her opiates which she did not get filled.  Pain characterizes sharp and worse with any movement.      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   JOINT REPLACEMENT Right    shoulder   left hip replacement  02-27-12   just screw - no replacement   NECK SURGERY     plates and screws   ORIF WRIST FRACTURE  1986   right    right femur replacement     ROTATOR CUFF REPAIR     right   SHOULDER HEMI-ARTHROPLASTY  02/27/2012   Procedure: SHOULDER HEMI-ARTHROPLASTY;  Surgeon: Kerrin Champagne, MD;  Location: [facility] OR;  Service: Orthopedics;  Laterality: Right;  Right shoulder hemiarthroplasty, repair right rotator cuff   TONSILLECTOMY     TOTAL KNEE ARTHROPLASTY Left 03/27/2020   TOTAL KNEE ARTHROPLASTY Left 03/27/2020   Procedure: LEFT TOTAL KNEE ARTHROPLASTY;  Surgeon: Cammy Copa, MD;  Location: [facility] OR;  Service: Orthopedics;  Laterality: Left;   WRIST SURGERY     tendon repair from a dog bite- left     OB History   No obstetric history on file.     History reviewed. No pertinent family history.  Social History   Tobacco Use   Smoking status: Every Day    Packs/day: 0.50    Years: 50.00    Pack years: 25.00    Types: Cigarettes   Smokeless tobacco: Never  Vaping Use   Vaping Use: Never used  Substance Use Topics   Alcohol use: Yes    Comment: ocasionally   Drug use: No    Comment: quit in 1998  Home Medications Prior to Admission medications   Medication Sig Start Date End Date Taking? Authorizing Provider  albuterol (PROVENTIL HFA;VENTOLIN HFA) 108 (90 BASE) MCG/ACT inhaler Inhale 2 puffs into the lungs every 4 (four) hours as needed for wheezing or shortness of breath.    [provider]  amphetamine-dextroamphetamine (ADDERALL) 20 MG tablet Take 20 mg by mouth 2 (two) times daily.     [provider]  aspirin 81 MG chewable tablet Chew 1 tablet (81 mg total) by mouth daily. 04/03/20   Magnant, Charles L, PA-C  buPROPion (WELLBUTRIN XL) 150 MG 24 hr tablet Take 150 mg by mouth at bedtime.    [provider]  celecoxib (CELEBREX) 200 MG capsule Take 200 mg by mouth daily. 05/14/21   [provider]  cyproheptadine (PERIACTIN) 4 MG tablet Take 4 mg by mouth 3 (three) times daily.    [provider]  HYDROcodone-acetaminophen (NORCO/VICODIN) 5-325  MG tablet Take 2 tablets by mouth every 4 (four) hours as needed. 06/29/21   Lorre Nick, MD  lidocaine (LIDODERM) 5 % Place 1 patch onto the skin daily. Remove & Discard patch within 12 hours or as directed by MD 04/20/21   Sabas Sous, MD  LORazepam (ATIVAN) 1 MG tablet Take 1 tablet (1 mg total) by mouth every 12 (twelve) hours as needed for anxiety. Do not take with Narcotic medications. Must choose between one or the other 04/03/20   Magnant, Joycie Peek, PA-C  meclizine (ANTIVERT) 12.5 MG tablet Take 1 tablet (12.5 mg total) by mouth 3 (three) times daily as needed for dizziness. 06/29/21   Lorre Nick, MD  methocarbamol (ROBAXIN) 750 MG tablet Take 1 tablet (750 mg total) by mouth 3 (three) times daily as needed (muscle spasm/pain). 05/25/21   Cathren Laine, MD  mirtazapine (REMERON) 15 MG tablet Take 15 mg by mouth at bedtime. 05/14/21   [provider]  MYRBETRIQ 25 MG TB24 tablet Take 25 mg by mouth daily. 04/08/21   [provider]  traMADol (ULTRAM) 50 MG tablet Take 1 tablet (50 mg total) by mouth every 8 (eight) hours as needed. 05/25/21   Cathren Laine, MD  traZODone (DESYREL) 100 MG tablet Take 300 mg by mouth at bedtime.     [provider]  venlafaxine XR (EFFEXOR-XR) 150 MG 24 hr capsule Take 150 mg by mouth daily with breakfast.    [provider]    Allergies    Vancomycin and Penicillins  Review of Systems   Review of Systems  All other systems reviewed and are negative.  Physical Exam Updated Vital Signs BP 120/66 (BP Location: Left Arm)   Pulse 73   Temp 97.6 F (36.4 C) (Oral)   Resp 16   Ht 1.727 m (5\' 8" )   Wt 56.7 kg   SpO2 92%   BMI 19.01 kg/m   Physical Exam Vitals and nursing note reviewed.  Constitutional:      General: She is not in acute distress.    Appearance: Normal appearance. She is well-developed. She is not toxic-appearing.  HENT:     Head: Normocephalic and atraumatic.  Eyes:     General: Lids are  normal.     Conjunctiva/sclera: Conjunctivae normal.     Pupils: Pupils are equal, round, and reactive to light.  Neck:     Thyroid: No thyroid mass.     Trachea: No tracheal deviation.  Cardiovascular:     Rate and Rhythm: Normal rate and regular rhythm.  Heart sounds: Normal heart sounds. No murmur heard.   No gallop.  Pulmonary:     Effort: Pulmonary effort is normal. No respiratory distress.     Breath sounds: Normal breath sounds. No stridor. No decreased breath sounds, wheezing, rhonchi or rales.  Chest:    Abdominal:     General: There is no distension.     Palpations: Abdomen is soft.     Tenderness: There is no abdominal tenderness. There is no rebound.  Musculoskeletal:        General: No tenderness. Normal range of motion.     Cervical back: Normal range of motion and neck supple.  Skin:    General: Skin is warm and dry.     Findings: No abrasion or rash.  Neurological:     Mental Status: She is alert and oriented to person, place, and time. Mental status is at baseline.     GCS: GCS eye subscore is 4. GCS verbal subscore is 5. GCS motor subscore is 6.     Cranial Nerves: Cranial nerves are intact. No cranial nerve deficit.     Sensory: No sensory deficit.     Motor: Motor function is intact.  Psychiatric:        Attention and Perception: Attention normal.        Speech: Speech normal.        Behavior: Behavior normal.    ED Results / Procedures / Treatments   Labs (all labs ordered are listed, but only abnormal results are displayed) Labs Reviewed - No data to display  EKG None  Radiology DG Ribs Unilateral W/Chest Right  Result Date: 06/29/2021 CLINICAL DATA:  Fall, pain EXAM: RIGHT RIBS AND CHEST - 3+ VIEW COMPARISON:  None. FINDINGS: Overlying calcified breast implant capsule limits evaluation. No displaced fracture or other bone lesions are seen involving the ribs. There is no evidence of pneumothorax or pleural effusion. Status post right shoulder  arthroplasty. Both lungs are clear. Heart size and mediastinal contours are within normal limits. IMPRESSION: 1. Overlying calcified breast implant capsule limits evaluation. Within this limitation, no displaced rib fracture or other bone lesions identified. No pneumothorax. 2. Status post right shoulder arthroplasty. Electronically Signed   By: Jearld Lesch M.D.   On: 06/29/2021 14:40    Procedures Procedures   Medications Ordered in ED Medications  ketorolac (TORADOL) injection 60 mg (has no administration in time range)    ED Course  I have reviewed the triage vital signs and the nursing notes.  Pertinent labs & imaging results that were available during my care of the patient were reviewed by me and considered in my medical decision making (see chart for details).    MDM Rules/Calculators/A&P                           Patient notes that she is homeless at this time.  Given Toradol here.  Will give referral to shelters Final Clinical Impression(s) / ED Diagnoses Final diagnoses:  None    Rx / DC Orders ED Discharge Orders     None        Lorre Nick, MD 06/30/21 1610

## 2021-06-30 NOTE — Discharge Instructions (Addendum)
Please allow patient to sleep in the lower bunk bed
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
## 2021-07-02 ENCOUNTER — Encounter (HOSPITAL_COMMUNITY): Payer: Self-pay

## 2021-07-02 ENCOUNTER — Emergency Department (HOSPITAL_COMMUNITY)
Admission: EM | Admit: 2021-07-02 | Discharge: 2021-07-02 | Disposition: A | Discharge status: Home / Self Care | Payer: Medicare Other | Attending: Emergency Medicine | Admitting: Emergency Medicine

## 2021-07-02 ENCOUNTER — Other Ambulatory Visit: Payer: Self-pay

## 2021-07-02 DIAGNOSIS — Z96652 Presence of left artificial knee joint: Secondary | ICD-10-CM | POA: Insufficient documentation

## 2021-07-02 DIAGNOSIS — Z5902 Unsheltered homelessness: Secondary | ICD-10-CM | POA: Diagnosis not present

## 2021-07-02 DIAGNOSIS — Z96611 Presence of right artificial shoulder joint: Secondary | ICD-10-CM | POA: Diagnosis not present

## 2021-07-02 DIAGNOSIS — J45909 Unspecified asthma, uncomplicated: Secondary | ICD-10-CM | POA: Insufficient documentation

## 2021-07-02 DIAGNOSIS — F17210 Nicotine dependence, cigarettes, uncomplicated: Secondary | ICD-10-CM | POA: Diagnosis not present

## 2021-07-02 DIAGNOSIS — Z5900 Homelessness unspecified: Secondary | ICD-10-CM

## 2021-07-02 DIAGNOSIS — Z79899 Other long term (current) drug therapy: Secondary | ICD-10-CM | POA: Insufficient documentation

## 2021-07-02 DIAGNOSIS — I10 Essential (primary) hypertension: Secondary | ICD-10-CM | POA: Diagnosis not present
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
## 2021-07-02 NOTE — Discharge Instructions (Signed)
Substance Abuse Treatment Programs ° °Intensive Outpatient Programs °[facility]     °[address]      °[place], [place]                   °[phone]      ° °[facility] °[address] °[place], [place] °336-379-7146 ° °[facility]     °(Inpatient and outpatient)     °[address]           °336-832-9800   ° °[facility] °336-288-1484 (Suboxone and Methadone) ° °[address]      °[place], [place] [address]      °336-882-2125      ° °[address] °[place], [place] °852-3033 ° °[facility] (Outpatient/Inpatient, Chemical)    °(insurance only) [phone]      °       °[facility] (Groups & Residential) °[place], [place] °336-389-1413 ° °   °[facility]     °[address]     °[place], [place]      °336-389-1413      ° °[facility] (for caregivers and family) °[address] °[place], [place] °336-299-4655 ° ° ° ° ° °Residential Treatment Programs °[facility]      °[address], [place], [place] [address]  °([phone]      ° °[facility] °[address] [place], [place] [address] °919-419-1059 ° °[facility]        °336-248-8914      ° °[facility] °1-800-659-3381 ° °[facility]             °[address]                                         °[place], [place]                                                °877-615-2722 or 336-784-9470                              ° °[facility] [place] °[address] °[place], [address] °1.877.941.8954 ° °[facility]    °[address]      °[place], [place]     °336-273-5306      ° °[facility] °[address] °[place], [place] °336-285-9073 ° °[facility]   °[address]     °[place], [place] [address]     °336-899-1550      °Admissions: 8am-3pm M-F ° °[facility] °[address] °[place],  [place] °336-227-7417 ° °[facility]: Residential Program (90 Days)   °[place], [place]      °336-725-8389 or 800-758-6077    ° °[facility] °[place], [place] °(Walk in Hours over the weekend or by referral) ° °[facility] °[address], [place], [place] [address] °(336) [phone] ° °Crisis Mobile: [facility]:  1-877-626-1772 (for crisis response 24 hours a day) °[facility] Hotline:      1-800-256-2452 °Outpatient Psychiatry and Counseling ° °[facility]: Mobile Crisis   Management 24 hours:  1-877-626-1772 ° °[facility] sliding scale fee and walk in schedule: M-F 8am-12pm/1pm-3pm °[address]  °[place], [place] [address] °336-387-6161 ° °[facility] °[address] °[place], [place] [address] °336-703-9650 ° °[facility] ([facility] new patient walk-in appointments available Monday - Friday 8am -3pm.          °[address] °[place], [place] [address] °336-676-6840 or crisis line- 336-676-6905 ° °[facility] Intensive Outpatient Therapy Program °[address] °[place], [place] [address] °336-832-9804 ° °[facility]                  °Crisis Services      °336.641.4993      °[address]     °[place], [place] [address]                ° °[facility]   °[facility] °[phone] °[address] °[place], [place] [address] ° ° °[facility]          °[address]  °[place], [place] [address]       °(336) [phone] ° °[facility] °[address] °[place], [place] [address] °336-292-1510 ° °[facility]    °[address]    °[place], [place] [address]     °336-632-3505      ° °Parish McKinney, MD     °[address]     °[place] [address]     °336-282-1251     °  °[facility] °[address] °[place] [address] ° °[facility]     °[address]     °[place], [place]      °336-542-2076      ° °[facility] °Shamsher Ahluwalia, MD °[address] °[place], [place] [address] °336-420-9558 ° °[facility]     °[address]     °[place], [place] [address]     °336-274-1237      ° °[facility] °[address] °[place], [place] [address] °336-854-4450 °Resources for Temporary Residential Assistance/Crisis Centers ° °DAY CENTERS °[facility] °M-F 8am-3pm   °[address] [place], [place] [address]   336-332-0824 °Services include: laundry, barbering, support groups, case management, phone  & computer access, showers, AA/NA mtgs, mental health/substance abuse nurse, job skills class, disability information, VA assistance, spiritual classes, etc.  ° °HOMELESS SHELTERS ° °[facility]     °[facility]   °[address], [place]     °336.271.5959       °       °[facility] (women and children)       °[address] °[place], [place] [address] °336-275-0820 °[email or web address] for application and process °Application Required ° °[facility]   °[address]    °[place] [address]     °336.886.4922       °             °[facility] °[address] °[place], [place] [address] °336.273.5572 °336-235-0363(schedule application appt.) °Application Required ° °[facility] (women only)    °[address]     °[place], [place] [address]     °336-884-1039      °  Intake starts 6pm daily °Need valid ID, SSC, & Police report °[facility] [place] °[address] °[place], [place] °336-881-5420 °Application Required ° °[facility] (men only)     °[address]      °[place], [place]     °336.748.1962      ° °[facility] °(Pregnant women only) °[address] °[place], [place] °336-275-0206 ° °[facility]      °[address]      °[place], [place] [address]     °336-722-9951      °       °[facility] [place] [facility] °[address] °[place], [place] °336-723-1848 °90 day commitment/SA/Application process ° °[facility](men only)     °[address]     °[place], [place]     °336-748-1962       °Check-in at 7pm     °       °[facility] °[address] °[place], [place] [address] °336-248-6684 °Men/Women/Women and Children must be there by 7 pm ° °[facility] °[place], [place] °336-722-8721                ° °
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
## 2021-07-02 NOTE — ED Triage Notes (Signed)
Per EMS-getting kicked out of her hotel due to smoking in the room-patient called 911 so she could come to ED for social work-states she is now homeless-was seen at [facility] this am and discharged-EMS workers attempted to call local shelters but they were all full

## 2021-07-02 NOTE — ED Notes (Signed)
Patient is eating.

## 2021-07-04 ENCOUNTER — Emergency Department (HOSPITAL_COMMUNITY)
Admission: EM | Admit: 2021-07-04 | Discharge: 2021-07-04 | Disposition: A | Discharge status: Home / Self Care | Payer: Medicare Other | Attending: Emergency Medicine | Admitting: Emergency Medicine

## 2021-07-04 ENCOUNTER — Other Ambulatory Visit: Payer: Self-pay

## 2021-07-04 ENCOUNTER — Encounter (HOSPITAL_COMMUNITY): Payer: Self-pay

## 2021-07-04 ENCOUNTER — Emergency Department (HOSPITAL_COMMUNITY)
Admission: EM | Admit: 2021-07-04 | Discharge: 2021-07-04 | Discharge status: Absent without leave | Payer: Medicare Other

## 2021-07-04 DIAGNOSIS — R5383 Other fatigue: Secondary | ICD-10-CM | POA: Diagnosis not present

## 2021-07-04 DIAGNOSIS — R112 Nausea with vomiting, unspecified: Secondary | ICD-10-CM | POA: Insufficient documentation

## 2021-07-04 DIAGNOSIS — I10 Essential (primary) hypertension: Secondary | ICD-10-CM | POA: Diagnosis not present

## 2021-07-04 DIAGNOSIS — Z96652 Presence of left artificial knee joint: Secondary | ICD-10-CM | POA: Diagnosis not present

## 2021-07-04 DIAGNOSIS — Z96611 Presence of right artificial shoulder joint: Secondary | ICD-10-CM | POA: Insufficient documentation

## 2021-07-04 DIAGNOSIS — Z5900 Homelessness unspecified: Secondary | ICD-10-CM | POA: Insufficient documentation

## 2021-07-04 DIAGNOSIS — Z7982 Long term (current) use of aspirin: Secondary | ICD-10-CM | POA: Diagnosis not present

## 2021-07-04 DIAGNOSIS — F17210 Nicotine dependence, cigarettes, uncomplicated: Secondary | ICD-10-CM | POA: Insufficient documentation

## 2021-07-04 DIAGNOSIS — J45909 Unspecified asthma, uncomplicated: Secondary | ICD-10-CM | POA: Insufficient documentation

## 2021-07-04 MED ORDER — ONDANSETRON 4 MG PO TBDP: 4.0000 mg | ORAL_TABLET | Freq: Three times a day (TID) | ORAL | 0 refills | Status: DC | PRN

## 2021-07-04 MED ORDER — ONDANSETRON 4 MG PO TBDP
4.0000 mg | ORAL_TABLET | Freq: Once | ORAL | Status: AC
  Administered 2021-07-04: 4 mg via ORAL
  Filled 2021-07-04: qty 1

## 2021-07-04 NOTE — ED Notes (Signed)
Pt given PO fluids and sandwich. Pt denies nausea at this time.

## 2021-07-04 NOTE — ED Notes (Signed)
An After Visit Summary was printed and given to the patient. Discharge instructions given and no further questions at this time.  Pt spoke to Social Work, pt ambulatory with her walker.

## 2021-07-04 NOTE — Progress Notes (Signed)
.  Transition of Care [facility]) - Emergency Department Mini Assessment   Patient Details  Name: [name] MRN: [id number] Date of Birth: [birth year]  [facility]) CM/SW Contact:    Larrie Kass, LCSW Phone Number: 07/04/2021, 12:46 PM   Clinical Narrative:  CSW spoke with pt, pt stated she is looking for housing. Pt was provided with resources at her last ED visit. Pt has a history of being placed and leaving the facilities per pt's friend Orpha Bur. Pt stated she had appointments with [facility] but did not go. Pt was informed that she will need to go to social services or Housing Authority for housing assistance. Pt was also informed that she can get help with placement through her PCP. Pt was given shelter resources again, a bus pass, and directed to the [facility] day center upon d/c. CSW recommends pt to follow up with [facility] and wellness center, as they provided transportation assistance.   Valentina Shaggy.Jakin Pavao, MSW, LCSWA [facility]  Transitions of Care Clinical Social Worker I Direct Dial: [phone]  Fax: [phone] [email or web address]    ED Mini Assessment: What brought you to the Emergency Department? 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

## 2021-07-04 NOTE — ED Triage Notes (Signed)
Pt BIB EMS from hotel. Pt states she is anxious and nauseous. Pt seen here in [facility] ED 2 days.

## 2021-07-04 NOTE — Discharge Instructions (Signed)
Your history and exam today were overall reassuring and we had a shared decision-making conversation and agreed together to hold on any lab work or imaging.  We were able to improve your nausea and vomiting with the nausea medicine and you were able to eat and drink without difficulty.  After having the social work team meet with you, they feel you are appropriate for discharge to go to a shelter so that you have housing for the night.  Please do this.  If any symptoms change or worsen, please return to the nearest emergency department.

## 2021-07-04 NOTE — ED Provider Notes (Signed)
[facility]-EMERGENCY DEPT Provider Note   CSN: [id number] Arrival date & time: 07/04/21  1115     History Chief Complaint  Patient presents with   Anxiety    JUAN OLTHOFF is a 70 y.o. female.  The history is provided by the patient and medical records. No language interpreter was used.  Anxiety This is a new problem. The current episode started more than 2 days ago. The problem occurs constantly. The problem has not changed since onset.Pertinent negatives include no chest pain, no abdominal pain, no headaches and no shortness of breath. Nothing aggravates the symptoms. Nothing relieves the symptoms. She has tried nothing for the symptoms. The treatment provided no relief.      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   JOINT REPLACEMENT Right    shoulder   left hip  replacement  02-27-12   just screw - no replacement   NECK SURGERY     plates and screws   ORIF WRIST FRACTURE  1986   right   right femur replacement     ROTATOR CUFF REPAIR     right   SHOULDER HEMI-ARTHROPLASTY  02/27/2012   Procedure: SHOULDER HEMI-ARTHROPLASTY;  Surgeon: Kerrin Champagne, MD;  Location: [facility] OR;  Service: Orthopedics;  Laterality: Right;  Right shoulder hemiarthroplasty, repair right rotator cuff   TONSILLECTOMY     TOTAL KNEE ARTHROPLASTY Left 03/27/2020   TOTAL KNEE ARTHROPLASTY Left 03/27/2020   Procedure: LEFT TOTAL KNEE ARTHROPLASTY;  Surgeon: Cammy Copa, MD;  Location: [facility] OR;  Service: Orthopedics;  Laterality: Left;   WRIST SURGERY     tendon repair from a dog bite- left     OB History   No obstetric history on file.     History reviewed. No pertinent family history.  Social History   Tobacco Use   Smoking status: Every Day    Packs/day: 0.50    Years: 50.00    Pack years: 25.00    Types: Cigarettes   Smokeless tobacco: Never  Vaping Use   Vaping Use: Never used  Substance Use Topics   Alcohol use: Yes    Comment: ocasionally  Drug use: No    Comment: quit in 1998    Home Medications Prior to Admission medications   Medication Sig Start Date End Date Taking? Authorizing Provider  albuterol (PROVENTIL HFA;VENTOLIN HFA) 108 (90 BASE) MCG/ACT inhaler Inhale 2 puffs into the lungs every 4 (four) hours as needed for wheezing or shortness of breath.    [provider]  amphetamine-dextroamphetamine (ADDERALL) 20 MG tablet Take 20 mg by mouth 2 (two) times daily.     [provider]  aspirin 81 MG chewable tablet Chew 1 tablet (81 mg total) by mouth daily. 04/03/20   Magnant, Charles L, PA-C  buPROPion (WELLBUTRIN XL) 150 MG 24 hr tablet Take 150 mg by mouth at bedtime.    [provider]  celecoxib (CELEBREX) 200 MG capsule Take 200 mg by mouth daily. 05/14/21   [provider]  cyproheptadine (PERIACTIN) 4  MG tablet Take 4 mg by mouth 3 (three) times daily.    [provider]  HYDROcodone-acetaminophen (NORCO/VICODIN) 5-325 MG tablet Take 2 tablets by mouth every 4 (four) hours as needed. 06/29/21   Lorre Nick, MD  lidocaine (LIDODERM) 5 % Place 1 patch onto the skin daily. Remove & Discard patch within 12 hours or as directed by MD 04/20/21   Sabas Sous, MD  LORazepam (ATIVAN) 1 MG tablet Take 1 tablet (1 mg total) by mouth every 12 (twelve) hours as needed for anxiety. Do not take with Narcotic medications. Must choose between one or the other 04/03/20   Magnant, Joycie Peek, PA-C  meclizine (ANTIVERT) 12.5 MG tablet Take 1 tablet (12.5 mg total) by mouth 3 (three) times daily as needed for dizziness. 06/29/21   Lorre Nick, MD  methocarbamol (ROBAXIN) 750 MG tablet Take 1 tablet (750 mg total) by mouth 3 (three) times daily as needed (muscle spasm/pain). 05/25/21   Cathren Laine, MD  mirtazapine (REMERON) 15 MG tablet Take 15 mg by mouth at bedtime. 05/14/21   [provider]  MYRBETRIQ 25 MG TB24 tablet Take 25 mg by mouth daily. 04/08/21   [provider]  traMADol (ULTRAM) 50 MG tablet Take 1 tablet (50 mg total) by mouth every 8 (eight) hours as needed. 05/25/21   Cathren Laine, MD  traZODone (DESYREL) 100 MG tablet Take 300 mg by mouth at bedtime.     [provider]  venlafaxine XR (EFFEXOR-XR) 150 MG 24 hr capsule Take 150 mg by mouth daily with breakfast.    [provider]    Allergies    Vancomycin and Penicillins  Review of Systems   Review of Systems  Constitutional:  Positive for fatigue. Negative for chills, diaphoresis and fever.  HENT:  Negative for congestion.   Respiratory:  Negative for cough, chest tightness and shortness of breath.   Cardiovascular:  Negative for chest pain and palpitations.  Gastrointestinal:  Positive for nausea and vomiting. Negative for abdominal pain, constipation and diarrhea.  Genitourinary:  Negative  for dysuria, flank pain and frequency.  Musculoskeletal:  Negative for back pain.  Skin:  Negative for rash and wound.  Neurological:  Negative for light-headedness, numbness and headaches.  Psychiatric/Behavioral:  Negative for agitation and confusion.   All other systems reviewed and are negative.  Physical Exam Updated Vital Signs BP 130/87 (BP Location: Left Arm)   Pulse 88   Temp 98.6 F (37 C) (Oral)   Resp 15   SpO2 100%   Physical Exam Vitals and nursing note reviewed.  Constitutional:  General: She is not in acute distress.    Appearance: She is well-developed. She is not ill-appearing, toxic-appearing or diaphoretic.  HENT:     Head: Normocephalic and atraumatic.     Nose: No congestion or rhinorrhea.     Mouth/Throat:     Mouth: Mucous membranes are dry.     Pharynx: No oropharyngeal exudate or posterior oropharyngeal erythema.  Eyes:     Extraocular Movements: Extraocular movements intact.     Conjunctiva/sclera: Conjunctivae normal.     Pupils: Pupils are equal, round, and reactive to light.  Cardiovascular:     Rate and Rhythm: Normal rate and regular rhythm.     Heart sounds: No murmur heard. Pulmonary:     Effort: Pulmonary effort is normal. No respiratory distress.     Breath sounds: Normal breath sounds.  Abdominal:     General: Abdomen is flat.     Palpations: Abdomen is soft.     Tenderness: There is no abdominal tenderness. There is no guarding or rebound.  Musculoskeletal:        General: No tenderness.     Cervical back: Neck supple. No tenderness.  Skin:    General: Skin is warm and dry.     Capillary Refill: Capillary refill takes less than 2 seconds.     Findings: Bruising present. No erythema.  Neurological:     General: No focal deficit present.     Mental Status: She is alert. Mental status is at baseline.     Sensory: No sensory deficit.     Motor: No weakness.  Psychiatric:        Mood and Affect: Mood normal.    ED Results /  Procedures / Treatments   Labs (all labs ordered are listed, but only abnormal results are displayed) Labs Reviewed - No data to display  EKG None  Radiology No results found.  Procedures Procedures   Medications Ordered in ED Medications  ondansetron (ZOFRAN-ODT) disintegrating tablet 4 mg (4 mg Oral Given 07/04/21 1202)    ED Course  I have reviewed the triage vital signs and the nursing notes.  Pertinent labs & imaging results that were available during my care of the patient were reviewed by me and considered in my medical decision making (see chart for details).    MDM Rules/Calculators/A&P                           ARANDA BIHM is a 70 y.o. female with a past medical history significant for depression, hyperlipidemia, osteoporosis, anxiety, hypertension, and arthritis who presents with nausea, vomiting, and fatigue.  Patient reports that she is currently homeless and checked out of a motel room she was staying at this morning and due to how she was feeling wanted to come get evaluated.  She reports has had nausea and vomiting for the last few days and does not seem to be doing any better.  Patient denies any headache, neck pain, or any trauma.  She denies any chest pain, shortness breath, palpitations, constipation, diarrhea, or urinary changes.  She reports she does not take her medicines.  On exam, lungs are clear and chest is nontender.  Abdomen is nontender.  Patient moving all extremities.  Mucous membranes do appear dry and patient has bruising on her forehead.  No other focal deficits.  Had a shared decision made conversation with patient about management.  We offered her CT of the head given the  bruising of the head and the nausea vomiting but she would rather hold off.  We also offered lab testing to look for electrolyte imbalance or significant dehydration given her reported nausea vomiting several days however patient would rather get some nausea medicine and try  p.o. challenge as opposed to further work-up.  Given her reassuring vital signs and otherwise well appearance, this is reasonable.  We will give Zofran, p.o. challenge, and call the transitions of care team to come evaluate patient to  see what resources and options are available for her as she is now homeless leaving the motel room she was staying at this morning.  Patient felt much better after Zofran and was able to eat several sandwiches and drink.  She was able to ambulate well.  Patient was seen by social work who will help arrange her to go to the [facility] for housing.  Patient agrees and was given a prescription for the nausea medicine and was discharged in good condition.  She still agrees with the plan.   Final Clinical Impression(s) / ED Diagnoses Final diagnoses:  Nausea and vomiting, unspecified vomiting type    Rx / DC Orders ED Discharge Orders          Ordered    ondansetron (ZOFRAN ODT) 4 MG disintegrating tablet  Every 8 hours PRN        07/04/21 1409            Clinical Impression: 1. Nausea and vomiting, unspecified vomiting type     Disposition: Discharge  Condition: Good  I have discussed the results, Dx and Tx plan with the pt(& family if present). He/she/they expressed understanding and agree(s) with the plan. Discharge instructions discussed at great length. Strict return precautions discussed and pt &/or family have verbalized understanding of the instructions. No further questions at time of discharge.    Discharge Medication List as of 07/04/2021  2:10 PM     START taking these medications   Details  ondansetron (ZOFRAN ODT) 4 MG disintegrating tablet Take 1 tablet (4 mg total) by mouth every 8 (eight) hours as needed for nausea or vomiting., Starting Thu 07/04/2021, Print        Follow Up: [facility] [address] [place] [address] [phone] Call today contact  to establish Primary  Care.  Paulino Door, MD [address] [place] [address] [phone]     [facility] DEPT [address] [id number] mc [place] [address] [phone]       Chaunda Vandergriff, Canary Brim, MD 07/04/21 [phone]

## 2021-07-04 NOTE — ED Notes (Signed)
ED Provider at bedside. 

## 2021-07-08 ENCOUNTER — Ambulatory Visit: Payer: Self-pay

## 2021-07-08 ENCOUNTER — Inpatient Hospital Stay (HOSPITAL_COMMUNITY)
Admission: EM | Admit: 2021-07-08 | Discharge: 2021-07-20 | DRG: 199 | Disposition: A | Discharge status: Skilled Nursing Facility | Payer: Medicare Other | Attending: Internal Medicine | Admitting: Internal Medicine

## 2021-07-08 ENCOUNTER — Emergency Department (HOSPITAL_COMMUNITY): Payer: Medicare Other

## 2021-07-08 ENCOUNTER — Encounter (HOSPITAL_COMMUNITY): Payer: Self-pay | Admitting: Emergency Medicine

## 2021-07-08 DIAGNOSIS — J9601 Acute respiratory failure with hypoxia: Secondary | ICD-10-CM | POA: Diagnosis present

## 2021-07-08 DIAGNOSIS — J939 Pneumothorax, unspecified: Secondary | ICD-10-CM

## 2021-07-08 DIAGNOSIS — Z96642 Presence of left artificial hip joint: Secondary | ICD-10-CM | POA: Diagnosis present

## 2021-07-08 DIAGNOSIS — T8549XA Other mechanical complication of breast prosthesis and implant, initial encounter: Secondary | ICD-10-CM | POA: Diagnosis present

## 2021-07-08 DIAGNOSIS — M4804 Spinal stenosis, thoracic region: Secondary | ICD-10-CM | POA: Diagnosis present

## 2021-07-08 DIAGNOSIS — R4189 Other symptoms and signs involving cognitive functions and awareness: Secondary | ICD-10-CM

## 2021-07-08 DIAGNOSIS — K219 Gastro-esophageal reflux disease without esophagitis: Secondary | ICD-10-CM | POA: Diagnosis present

## 2021-07-08 DIAGNOSIS — G8929 Other chronic pain: Secondary | ICD-10-CM | POA: Diagnosis present

## 2021-07-08 DIAGNOSIS — J942 Hemothorax: Secondary | ICD-10-CM | POA: Diagnosis present

## 2021-07-08 DIAGNOSIS — R1011 Right upper quadrant pain: Secondary | ICD-10-CM

## 2021-07-08 DIAGNOSIS — I10 Essential (primary) hypertension: Secondary | ICD-10-CM | POA: Diagnosis present

## 2021-07-08 DIAGNOSIS — R1013 Epigastric pain: Secondary | ICD-10-CM

## 2021-07-08 DIAGNOSIS — W1830XA Fall on same level, unspecified, initial encounter: Secondary | ICD-10-CM | POA: Diagnosis present

## 2021-07-08 DIAGNOSIS — M47812 Spondylosis without myelopathy or radiculopathy, cervical region: Secondary | ICD-10-CM | POA: Diagnosis present

## 2021-07-08 DIAGNOSIS — R0602 Shortness of breath: Secondary | ICD-10-CM

## 2021-07-08 DIAGNOSIS — I341 Nonrheumatic mitral (valve) prolapse: Secondary | ICD-10-CM | POA: Diagnosis present

## 2021-07-08 DIAGNOSIS — R413 Other amnesia: Secondary | ICD-10-CM | POA: Diagnosis present

## 2021-07-08 DIAGNOSIS — Z90710 Acquired absence of both cervix and uterus: Secondary | ICD-10-CM

## 2021-07-08 DIAGNOSIS — F32A Depression, unspecified: Secondary | ICD-10-CM | POA: Diagnosis present

## 2021-07-08 DIAGNOSIS — K5900 Constipation, unspecified: Secondary | ICD-10-CM | POA: Diagnosis present

## 2021-07-08 DIAGNOSIS — D62 Acute posthemorrhagic anemia: Secondary | ICD-10-CM | POA: Diagnosis present

## 2021-07-08 DIAGNOSIS — R079 Chest pain, unspecified: Secondary | ICD-10-CM | POA: Diagnosis not present

## 2021-07-08 DIAGNOSIS — E7800 Pure hypercholesterolemia, unspecified: Secondary | ICD-10-CM | POA: Diagnosis present

## 2021-07-08 DIAGNOSIS — M810 Age-related osteoporosis without current pathological fracture: Secondary | ICD-10-CM | POA: Diagnosis present

## 2021-07-08 DIAGNOSIS — S271XXA Traumatic hemothorax, initial encounter: Secondary | ICD-10-CM | POA: Diagnosis not present

## 2021-07-08 DIAGNOSIS — J9690 Respiratory failure, unspecified, unspecified whether with hypoxia or hypercapnia: Secondary | ICD-10-CM

## 2021-07-08 DIAGNOSIS — F329 Major depressive disorder, single episode, unspecified: Secondary | ICD-10-CM | POA: Diagnosis present

## 2021-07-08 DIAGNOSIS — J45909 Unspecified asthma, uncomplicated: Secondary | ICD-10-CM | POA: Diagnosis present

## 2021-07-08 DIAGNOSIS — F419 Anxiety disorder, unspecified: Secondary | ICD-10-CM | POA: Diagnosis present

## 2021-07-08 DIAGNOSIS — Z96652 Presence of left artificial knee joint: Secondary | ICD-10-CM | POA: Diagnosis present

## 2021-07-08 DIAGNOSIS — S2222XA Fracture of body of sternum, initial encounter for closed fracture: Secondary | ICD-10-CM | POA: Diagnosis present

## 2021-07-08 DIAGNOSIS — F17210 Nicotine dependence, cigarettes, uncomplicated: Secondary | ICD-10-CM | POA: Diagnosis present

## 2021-07-08 DIAGNOSIS — Z5900 Homelessness unspecified: Secondary | ICD-10-CM

## 2021-07-08 DIAGNOSIS — Y812 Prosthetic and other implants, materials and accessory general- and plastic-surgery devices associated with adverse incidents: Secondary | ICD-10-CM | POA: Diagnosis present

## 2021-07-08 DIAGNOSIS — Z4682 Encounter for fitting and adjustment of non-vascular catheter: Secondary | ICD-10-CM

## 2021-07-08 DIAGNOSIS — S2241XA Multiple fractures of ribs, right side, initial encounter for closed fracture: Secondary | ICD-10-CM

## 2021-07-08 DIAGNOSIS — Z20822 Contact with and (suspected) exposure to covid-19: Secondary | ICD-10-CM | POA: Diagnosis present

## 2021-07-08 DIAGNOSIS — S0083XA Contusion of other part of head, initial encounter: Secondary | ICD-10-CM | POA: Diagnosis present

## 2021-07-08 DIAGNOSIS — S22070A Wedge compression fracture of T9-T10 vertebra, initial encounter for closed fracture: Secondary | ICD-10-CM

## 2021-07-08 LAB — HEPATIC FUNCTION PANEL
ALT: 9 U/L (ref 0–44)
AST: 17 U/L (ref 15–41)
Albumin: 3 g/dL — ABNORMAL LOW (ref 3.5–5.0)
Alkaline Phosphatase: 73 U/L (ref 38–126)
Bilirubin, Direct: 0.2 mg/dL (ref 0.0–0.2)
Indirect Bilirubin: 0 mg/dL — ABNORMAL LOW (ref 0.3–0.9)
Total Bilirubin: 0.2 mg/dL — ABNORMAL LOW (ref 0.3–1.2)
Total Protein: 5.6 g/dL — ABNORMAL LOW (ref 6.5–8.1)

## 2021-07-08 LAB — BASIC METABOLIC PANEL
Anion gap: 12 (ref 5–15)
BUN: 24 mg/dL — ABNORMAL HIGH (ref 8–23)
CO2: 20 mmol/L — ABNORMAL LOW (ref 22–32)
Calcium: 9 mg/dL (ref 8.9–10.3)
Chloride: 104 mmol/L (ref 98–111)
Creatinine, Ser: 0.91 mg/dL (ref 0.44–1.00)
GFR, Estimated: >60 mL/min (ref >60)
Glucose, Bld: 127 mg/dL — ABNORMAL HIGH (ref 70–99)
Potassium: 3.9 mmol/L (ref 3.5–5.1)
Sodium: 136 mmol/L (ref 135–145)

## 2021-07-08 LAB — PROTIME-INR
INR: 1.1 (ref 0.8–1.2)
Prothrombin Time: 14.1 seconds (ref 11.4–15.2)

## 2021-07-08 LAB — TROPONIN I (HIGH SENSITIVITY): Troponin I (High Sensitivity): 6 ng/L (ref <18)

## 2021-07-08 LAB — CBC
HCT: 32.9 % — ABNORMAL LOW (ref 36.0–46.0)
Hemoglobin: 10.3 g/dL — ABNORMAL LOW (ref 12.0–15.0)
MCH: 29.9 pg (ref 26.0–34.0)
MCHC: 31.3 g/dL (ref 30.0–36.0)
MCV: 95.4 fL (ref 80.0–100.0)
Platelets: 565 10*3/uL — ABNORMAL HIGH (ref 150–400)
RBC: 3.45 MIL/uL — ABNORMAL LOW (ref 3.87–5.11)
RDW: 13.9 % (ref 11.5–15.5)
WBC: 11.3 10*3/uL — ABNORMAL HIGH (ref 4.0–10.5)
nRBC: 0 % (ref 0.0–0.2)

## 2021-07-08 LAB — LIPASE, BLOOD: Lipase: 25 U/L (ref 11–51)

## 2021-07-08 LAB — D-DIMER, QUANTITATIVE: D-Dimer, Quant: 1.56 ug/mL-FEU — ABNORMAL HIGH (ref 0.00–0.50)

## 2021-07-08 LAB — AMMONIA: Ammonia: 22 umol/L (ref 9–35)

## 2021-07-08 LAB — TSH: TSH: 1.621 u[IU]/mL (ref 0.350–4.500)

## 2021-07-08 MED ORDER — IOHEXOL 350 MG/ML SOLN
55.0000 mL | Freq: Once | INTRAVENOUS | Status: AC | PRN
  Administered 2021-07-08: 55 mL via INTRAVENOUS

## 2021-07-08 MED ORDER — FENTANYL CITRATE PF 50 MCG/ML IJ SOSY
50.0000 ug | PREFILLED_SYRINGE | Freq: Once | INTRAMUSCULAR | Status: AC
Start: 2021-07-08 — End: 2021-07-08
  Administered 2021-07-08: 50 ug via INTRAVENOUS
  Filled 2021-07-08: qty 1

## 2021-07-08 MED ORDER — OXYCODONE HCL 5 MG PO TABS
5.0000 mg | ORAL_TABLET | Freq: Once | ORAL | Status: AC
  Administered 2021-07-08: 5 mg via ORAL
  Filled 2021-07-08: qty 1

## 2021-07-08 NOTE — NC FL2 (Signed)
[place] MEDICAID FL2 LEVEL OF CARE SCREENING TOOL     IDENTIFICATION  Patient Name: [name] Birthdate: 12/07/1950 Sex: female Admission Date (Current Location): 07/08/2021  [facility] [place] and [place] Number:  Producer, television/film/video and Address:  [facility] [place], 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   Pulmonary nodule 02/27/2012   Drug-seeking behavior 02/25/2011   ROTATOR CUFF REPAIR, RIGHT, HX OF 03/27/2010   HYPERLIPIDEMIA 02/21/2010   NECK PAIN, CHRONIC 11/17/2007   DEPRESSION 11/11/2007   OSTEOPOROSIS 11/11/2007    Orientation RESPIRATION BLADDER Height & Weight     Self, Time, Situation, Place  Normal Continent Weight: 124 lb (56.2 kg) Height:  5\' 8"  (172.7 cm)  BEHAVIORAL SYMPTOMS/MOOD NEUROLOGICAL BOWEL NUTRITION STATUS      Continent Diet (Regular)  AMBULATORY STATUS COMMUNICATION OF NEEDS Skin   Independent (Uses Rollator) Verbally Bruising                       Personal Care Assistance Level of Assistance  Bathing, Feeding, Dressing Bathing Assistance: Independent Feeding assistance: Independent Dressing Assistance: Independent     Functional Limitations Info  Sight, Hearing, Speech Sight Info: Adequate Hearing Info: Impaired Speech Info: Adequate    SPECIAL CARE FACTORS FREQUENCY                       Contractures Contractures Info:  Not present    Additional Factors Info  Code Status, Allergies Code Status Info: Full Allergies Info: (2) Penicillin,Vancomycin           Current Medications (07/08/2021):  This is the current hospital active medication list No current facility-administered medications for this encounter.   Current Outpatient Medications  Medication Sig Dispense Refill   amphetamine-dextroamphetamine (ADDERALL) 20 MG tablet Take 20 mg by mouth 2 (two) times daily.      buPROPion (WELLBUTRIN XL) 150 MG 24 hr tablet Take 150 mg by mouth at bedtime.     celecoxib (CELEBREX) 200 MG capsule Take 200 mg by mouth daily.     ibuprofen (ADVIL) 200 MG tablet Take 800 mg by mouth every 6 (six) hours as needed for mild pain or headache.     LORazepam (ATIVAN) 1 MG tablet Take 1 tablet (1 mg total) by mouth every 12 (twelve) hours as needed for anxiety. Do not take with Narcotic medications. Must choose between one or the other (Patient taking differently: Take 1 mg by mouth 2 (two) times daily as needed for anxiety ("Do not take with narcotic medications. Must choose between one or the other.").) 30 tablet 0   methocarbamol (ROBAXIN) 750 MG tablet Take 1 tablet (750 mg total) by mouth 3 (three) times daily as needed (muscle spasm/pain). 15 tablet 0   mirtazapine (REMERON) 15 MG tablet Take 15 mg by mouth at bedtime.     naproxen sodium (ALEVE)  220 MG tablet Take 220-440 mg by mouth 2 (two) times daily as needed (for mild pain or headaches).     traZODone (DESYREL) 100 MG tablet Take 300 mg by mouth at bedtime.      venlafaxine XR (EFFEXOR-XR) 150 MG 24 hr capsule Take 150 mg by mouth daily with breakfast.     albuterol (PROVENTIL HFA;VENTOLIN HFA) 108 (90 BASE) MCG/ACT inhaler Inhale 2 puffs into the lungs every 4 (four) hours as needed for wheezing or shortness of breath. (Patient not taking: Reported on 07/08/2021)     aspirin 81 MG chewable tablet Chew 1 tablet (81 mg total) by mouth daily. (Patient not taking: No  sig reported) 30 tablet 0   cyproheptadine (PERIACTIN) 4 MG tablet Take 4 mg by mouth 3 (three) times daily. (Patient not taking: Reported on 07/08/2021)     HYDROcodone-acetaminophen (NORCO/VICODIN) 5-325 MG tablet Take 2 tablets by mouth every 4 (four) hours as needed. 10 tablet 0   lidocaine (LIDODERM) 5 % Place 1 patch onto the skin daily. Remove & Discard patch within 12 hours or as directed by MD (Patient not taking: No sig reported) 5 patch 0   meclizine (ANTIVERT) 12.5 MG tablet Take 1 tablet (12.5 mg total) by mouth 3 (three) times daily as needed for dizziness. 15 tablet 0   MYRBETRIQ 25 MG TB24 tablet Take 25 mg by mouth daily. (Patient not taking: No sig reported)     ondansetron (ZOFRAN ODT) 4 MG disintegrating tablet Take 1 tablet (4 mg total) by mouth every 8 (eight) hours as needed for nausea or vomiting. 12 tablet 0   traMADol (ULTRAM) 50 MG tablet Take 1 tablet (50 mg total) by mouth every 8 (eight) hours as needed. (Patient not taking: No sig reported) 15 tablet 0     Discharge Medications: Please see discharge summary for a list of discharge medications.  Relevant Imaging Results:  Relevant Lab Results:   Additional Information SS# [id number]/YW has had 1 Covid vaccine  Kwadwo Taras, LCSW

## 2021-07-08 NOTE — Telephone Encounter (Signed)
Pt c/o middle and both sides of rib cage pain. Pt c/o pain with deep inhalation  and constant chest soreness. Denies radiating pain to left arm, neck, or jaw pain. Denies SOB, nausea, dizziness, nausea or vomiting, sweating or cough.  Pt has h/o right shoulder surgery and has fallen and hurt her chest 06/29/21. Pt has been evaluated several times for this issue.  Pt is homeless.  Pt was discharged from Dr Raeanne Gathers practice at [facility] 06/04/21. Pt has no PCP. Pt is staying with Azzie Glatter at the moment. Pt was seen by Ms Sharlot Gowda on Friday looking lost. Pt was trying to get to [employer]. Ms. Sharlot Gowda to her home with her and called this am trying to find pt assistance. Advised pt to go to ED and be NPO. Care advice given and pt verbalized understanding.  Pt took 800 mg between 0930-1000 this am with no effect.           Reason for Disposition  SEVERE chest pain  Answer Assessment - Initial Assessment Questions 1. LOCATION: "Where does it hurt?"       Middle and Both sides of rib cage 2. RADIATION: "Does the pain go anywhere else?" (e.g., into neck, jaw, arms, back)     back 3. ONSET: "When did the chest pain begin?" (Minutes, hours or days)      For a  while now 4. PATTERN "Does the pain come and go, or has it been constant since it started?"  "Does it get worse with exertion?"      Constant worse with movement 5. DURATION: "How long does it last" (e.g., seconds, minutes, hours)     constant 6. SEVERITY: "How bad is the pain?"  (e.g., Scale 1-10; mild, moderate, or severe)    - MILD (1-3): doesn't interfere with normal activities     - MODERATE (4-7): interferes with normal activities or awakens from sleep    - SEVERE (8-10): excruciating pain, unable to do any normal activities       severe 7. CARDIAC RISK FACTORS: "Do you have any history of heart problems or risk factors for heart disease?" (e.g., angina, prior heart attack; diabetes, high blood pressure, high  cholesterol, smoker, or strong family history of heart disease)     smoker 8. PULMONARY RISK FACTORS: "Do you have any history of lung disease?"  (e.g., blood clots in lung, asthma, emphysema, birth control pills)     no 9. CAUSE: "What do you think is causing the chest pain?"     Does not know 10. OTHER SYMPTOMS: "Do you have any other symptoms?" (e.g., dizziness, nausea, vomiting, sweating, fever, difficulty breathing, cough)       none 11. PREGNANCY: "Is there any chance you are pregnant?" "When was your last menstrual period?"       N/a  Protocols used: Chest Pain-A-AH

## 2021-07-08 NOTE — ED Triage Notes (Signed)
Pt endorses bilateral rib pain since Friday. Denies SOB, but has pain with deep breathing. Denies any falls.

## 2021-07-08 NOTE — ED Notes (Signed)
Pt is a difficult stick, IV team consult per standing order has been placed to get PIV & blood drawn.

## 2021-07-08 NOTE — Social Work (Signed)
CSW met with Pt and Pt's friend, Barbaraann Share Cregg @ bedside.  Pt is A&Ox4.  Per Pt she has been staying at [facility] but ran out of funds last Friday. However, per chart review, Pt was asked to leave motel due to violating terms of rental agreement.  Pt reports that she was previously living with a woman who broke her wrist but does not wish to discuss the details of that with CSW.  Pt is still being examined by EDP,CSW will continue to follow.

## 2021-07-08 NOTE — ED Notes (Signed)
Patient transported to Ultrasound 

## 2021-07-08 NOTE — ED Provider Notes (Signed)
[facility] EMERGENCY DEPARTMENT Provider Note   CSN: [id number] Arrival date & time: 07/08/21  1243     History Chief Complaint  Patient presents with   Chest Pain    [name] is a 70 y.o. female.  The history is provided by the patient and medical records. No language interpreter was used.  Chest Pain Pain location:  Epigastric, L chest, R chest and substernal area Pain quality: not aching   Pain radiates to:  Epigastrium Pain severity:  Severe Onset quality:  Gradual Duration:  1 week Timing:  Constant Progression:  Waxing and waning Chronicity:  New Context: breathing and movement   Relieved by:  Nothing Worsened by:  Certain positions and deep breathing Ineffective treatments:  None tried Associated symptoms: abdominal pain, fatigue, nausea and vomiting   Associated symptoms: no altered mental status, no back pain, no cough, no diaphoresis, no fever, no headache, no numbness, no palpitations, no shortness of breath and no weakness       Past Medical History:  Diagnosis Date   Anxiety    Arthritis    back, joints   Asthma    Chronic pain    Constipation    Depression    Dysrhythmia    Palpitations   GERD (gastroesophageal reflux disease)    High cholesterol    Hypertension    resolved whn she stopped drinking alcohol   Mitral prolapse    dx as a child   Osteopenia    Osteoporosis     Patient Active Problem List   Diagnosis Date Noted   Arthritis of left knee 03/27/2020   Humeral surgical neck fracture 02/27/2012    Class: Acute   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   JOINT REPLACEMENT Right    shoulder   left hip replacement  02-27-12   just screw - no replacement   NECK SURGERY     plates and screws   ORIF WRIST FRACTURE  1986   right   right femur replacement     ROTATOR CUFF REPAIR     right   SHOULDER HEMI-ARTHROPLASTY  02/27/2012   Procedure: SHOULDER HEMI-ARTHROPLASTY;  Surgeon: Kerrin Champagne, MD;  Location: [facility] OR;  Service: Orthopedics;  Laterality: Right;  Right shoulder hemiarthroplasty, repair right rotator cuff   TONSILLECTOMY     TOTAL KNEE ARTHROPLASTY Left 03/27/2020   TOTAL KNEE ARTHROPLASTY Left 03/27/2020   Procedure: LEFT TOTAL KNEE ARTHROPLASTY;  Surgeon: Cammy Copa, MD;  Location[facility] OR;  Service: Orthopedics;  Laterality: Left;   WRIST SURGERY     tendon repair from a dog bite- left     OB History   No obstetric history on file.     No family history on file.  Social History   Tobacco Use   Smoking status: Every Day  Packs/day: 0.50    Years: 50.00    Pack years: 25.00    Types: Cigarettes   Smokeless tobacco: Never  Vaping Use   Vaping Use: Never used  Substance Use Topics   Alcohol use: Yes    Comment: ocasionally   Drug use: No    Comment: quit in 1998    Home Medications Prior to Admission medications   Medication Sig Start Date End Date Taking? Authorizing Provider  albuterol (PROVENTIL HFA;VENTOLIN HFA) 108 (90 BASE) MCG/ACT inhaler Inhale 2 puffs into the lungs every 4 (four) hours as needed for wheezing or shortness of breath.    [provider]  amphetamine-dextroamphetamine (ADDERALL) 20 MG tablet Take 20 mg by mouth 2 (two) times daily.     [provider]  aspirin 81 MG chewable tablet Chew 1 tablet (81 mg total) by mouth daily. 04/03/20    Magnant, Charles L, PA-C  buPROPion (WELLBUTRIN XL) 150 MG 24 hr tablet Take 150 mg by mouth at bedtime.    [provider]  celecoxib (CELEBREX) 200 MG capsule Take 200 mg by mouth daily. 05/14/21   [provider]  cyproheptadine (PERIACTIN) 4 MG tablet Take 4 mg by mouth 3 (three) times daily.    [provider]  HYDROcodone-acetaminophen (NORCO/VICODIN) 5-325 MG tablet Take 2 tablets by mouth every 4 (four) hours as needed. 06/29/21   Lorre Nick, MD  lidocaine (LIDODERM) 5 % Place 1 patch onto the skin daily. Remove & Discard patch within 12 hours or as directed by MD 04/20/21   Sabas Sous, MD  LORazepam (ATIVAN) 1 MG tablet Take 1 tablet (1 mg total) by mouth every 12 (twelve) hours as needed for anxiety. Do not take with Narcotic medications. Must choose between one or the other 04/03/20   Magnant, Joycie Peek, PA-C  meclizine (ANTIVERT) 12.5 MG tablet Take 1 tablet (12.5 mg total) by mouth 3 (three) times daily as needed for dizziness. 06/29/21   Lorre Nick, MD  methocarbamol (ROBAXIN) 750 MG tablet Take 1 tablet (750 mg total) by mouth 3 (three) times daily as needed (muscle spasm/pain). 05/25/21   Cathren Laine, MD  mirtazapine (REMERON) 15 MG tablet Take 15 mg by mouth at bedtime. 05/14/21   [provider]  MYRBETRIQ 25 MG TB24 tablet Take 25 mg by mouth daily. 04/08/21   [provider]  ondansetron (ZOFRAN ODT) 4 MG disintegrating tablet Take 1 tablet (4 mg total) by mouth every 8 (eight) hours as needed for nausea or vomiting. 07/04/21   Sanaia Jasso, Canary Brim, MD  traMADol (ULTRAM) 50 MG tablet Take 1 tablet (50 mg total) by mouth every 8 (eight) hours as needed. 05/25/21   Cathren Laine, MD  traZODone (DESYREL) 100 MG tablet Take 300 mg by mouth at bedtime.     [provider]  venlafaxine XR (EFFEXOR-XR) 150 MG 24 hr capsule Take 150 mg by mouth daily with breakfast.    [provider]    Allergies    Vancomycin and  Penicillins  Review of Systems   Review of Systems  Constitutional:  Positive for fatigue. Negative for chills, diaphoresis and fever.  HENT:  Negative for congestion.   Respiratory:  Negative for cough, chest tightness, shortness of breath and wheezing.   Cardiovascular:  Positive for chest pain. Negative for palpitations and leg swelling.  Gastrointestinal:  Positive for abdominal pain, nausea and vomiting. Negative for abdominal distention, constipation and diarrhea.  Genitourinary:  Negative for dysuria, flank pain  and frequency.  Musculoskeletal:  Negative for back pain and neck pain.  Skin:  Negative for rash and wound.  Neurological:  Negative for weakness, light-headedness, numbness and headaches.  Psychiatric/Behavioral:  Negative for agitation and confusion.   All other systems reviewed and are negative.  Physical Exam Updated Vital Signs BP 122/73   Pulse 62   Temp 97.9 F (36.6 C)   Resp 18   SpO2 97%   Physical Exam Vitals and nursing note reviewed.  Constitutional:      General: She is not in acute distress.    Appearance: She is well-developed. She is not ill-appearing, toxic-appearing or diaphoretic.  HENT:     Head: Normocephalic and atraumatic.  Eyes:     Conjunctiva/sclera: Conjunctivae normal.     Pupils: Pupils are equal, round, and reactive to light.  Cardiovascular:     Rate and Rhythm: Normal rate and regular rhythm.     Heart sounds: No murmur heard. Pulmonary:     Effort: Pulmonary effort is normal. No respiratory distress.     Breath sounds: Normal breath sounds. No decreased breath sounds, wheezing, rhonchi or rales.  Chest:     Chest wall: Tenderness present.  Abdominal:     General: Abdomen is flat. There is no distension.     Palpations: Abdomen is soft.     Tenderness: There is abdominal tenderness in the right upper quadrant and epigastric area. There is no right CVA tenderness, left CVA tenderness, guarding or rebound.     Musculoskeletal:     Cervical back: Neck supple.     Right lower leg: No tenderness. No edema.     Left lower leg: No tenderness. No edema.  Skin:    General: Skin is warm and dry.     Capillary Refill: Capillary refill takes less than 2 seconds.  Neurological:     General: No focal deficit present.     Mental Status: She is alert.  Psychiatric:        Mood and Affect: Mood normal.    ED Results / Procedures / Treatments   Labs (all labs ordered are listed, but only abnormal results are displayed) Labs Reviewed  BASIC METABOLIC PANEL - Abnormal; Notable for the following components:      Result Value   CO2 20 (*)    Glucose, Bld 127 (*)    BUN 24 (*)    All other components within normal limits  CBC - Abnormal; Notable for the following components:   WBC 11.3 (*)    RBC 3.45 (*)    Hemoglobin 10.3 (*)    HCT 32.9 (*)    Platelets 565 (*)    All other components within normal limits  HEPATIC FUNCTION PANEL - Abnormal; Notable for the following components:   Total Protein 5.6 (*)    Albumin 3.0 (*)    Total Bilirubin 0.2 (*)    Indirect Bilirubin 0.0 (*)    All other components within normal limits  D-DIMER, QUANTITATIVE - Abnormal; Notable for the following components:   D-Dimer, Quant 1.56 (*)    All other components within normal limits  LIPASE, BLOOD  TSH  AMMONIA  PROTIME-INR  URINALYSIS, ROUTINE W REFLEX MICROSCOPIC  TROPONIN I (HIGH SENSITIVITY)  TROPONIN I (HIGH SENSITIVITY)    EKG EKG Interpretation  Date/Time:  Monday July 08 2021 13:06:14 EDT Ventricular Rate:  81 PR Interval:  112 QRS Duration: 66 QT Interval:  396 QTC Calculation: 460 R Axis:  43 Text Interpretation: Sinus rhythm with Premature atrial complexes Possible Left atrial enlargement Septal infarct , age undetermined Marked ST abnormality, possible inferior subendocardial injury Abnormal ECG When compared to prior, some new ST depressions. No STEMI Confirmed by Theda Belfast  [phone]) on 07/08/2021 4:08:40 PM  Radiology DG Chest 2 View  Result Date: 07/08/2021 CLINICAL DATA:  Bilateral rib pain. EXAM: CHEST - 2 VIEW COMPARISON:  Chest x-ray dated June 29, 2021. FINDINGS: Normal heart size. New moderate right pleural effusion right middle and lower lobe atelectasis. No pneumothorax. No acute osseous abnormality. Old right-sided rib fractures. IMPRESSION: 1. New moderate right pleural effusion with right middle and lower lobe atelectasis. Electronically Signed   By: Obie Dredge M.D.   On: 07/08/2021 14:42   CT Angio Chest PE W and/or Wo Contrast  Result Date: 07/08/2021 CLINICAL DATA:  Pt endorses bilateral rib pain since Friday. Denies SOB, but has pain with deep breathing. Denies any falls. EXAM: CT ANGIOGRAPHY CHEST WITH CONTRAST TECHNIQUE: Multidetector CT imaging of the chest was performed using the standard protocol during bolus administration of intravenous contrast. Multiplanar CT image reconstructions and MIPs were obtained to evaluate the vascular anatomy. CONTRAST:  55mL OMNIPAQUE IOHEXOL 350 MG/ML SOLN COMPARISON:  Chest x-ray 06/29/2021, CT chest 06/14/2020 FINDINGS: Cardiovascular: Satisfactory opacification of the pulmonary arteries to the segmental level. No evidence of pulmonary embolism. Normal heart size. No significant pericardial effusion. The thoracic aorta is normal in caliber. Mild atherosclerotic plaque of the thoracic aorta. At least 2 vessel coronary artery calcifications. Mediastinum/Nodes: No enlarged mediastinal, hilar, or axillary lymph nodes. Thyroid gland, trachea, and esophagus demonstrate no significant findings. Small to moderate volume hiatal hernia. Lungs/Pleura: Partial collapse of the right lower lobe. No focal consolidation. No pulmonary nodule. No pulmonary mass. Moderate volume right pleural fluid with hyperdensity along the rib fractures consistent with a hemothorax. No left pleural effusion. No pneumothorax. Upper Abdomen: No  acute abnormality. Musculoskeletal: Intra and extracapsular rupture of a bilateral breast implants. Associated circumferential calcifications. Chronic, but worsened from 06/14/2020, compression fracture of the T10 vertebral body with approximately 50% vertebral body height loss. Multiple other thoracic vertebral bodies demonstrate decreased vertebral body height loss. Old healed left rib fractures.  Old healed right rib fractures. Interval development of an acute right posterior eighth and ninth rib fractures (compared to CT chest 06/14/2020, retrospectively these may have been present on x-ray ribs 06/29/2021). Acute minimally displaced mid sternal body fracture. Right shoulder arthroplasty partially visualized. Review of the MIP images confirms the above findings. IMPRESSION: 1. No pulmonary embolus. 2. Acute posterior right 8 and 9 rib fractures with associated moderate volume hemothorax. Retrospectively fractures may have been present on x-ray ribs 06/29/2021. 3. Acute minimally displaced mid sternal body fracture. 4. Small to moderate volume hiatal hernia. 5. Chronic, but worsened from 06/14/2020, compression fracture of the T10 vertebral body with approximately 50% vertebral body height loss. Recommend correlation with point tenderness to palpation for an acute component. 6. 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 Correlate with mammography. 7.  Aortic Atherosclerosis (ICD10-I70.0). Electronically Signed   By: Tish Frederickson M.D.   On: 07/08/2021 23:00   US Abdomen Limited RUQ (LIVER/GB)  Result Date: 07/08/2021 CLINICAL DATA:  Right upper quadrant abdominal pain, nausea vomiting. EXAM: ULTRASOUND ABDOMEN LIMITED RIGHT UPPER QUADRANT COMPARISON:  None. FINDINGS: Gallbladder: No gallstones or wall thickening visualized. No sonographic Murphy sign noted by sonographer. Small focus of adenomyomatosis. Common bile duct: Diameter: 5 mm Liver: No focal  lesion identified. Within  normal limits in parenchymal echogenicity. Portal vein is patent on color Doppler imaging with normal direction of blood flow towards the liver. Other: Small right pleural effusion. IMPRESSION: Small right pleural effusion, otherwise unremarkable right upper quadrant ultrasound. Electronically Signed   By: Elgie Collard M.D.   On: 07/08/2021 19:57    Procedures Procedures   Medications Ordered in ED Medications  oxyCODONE (Oxy IR/ROXICODONE) immediate release tablet 5 mg (5 mg Oral Given 07/08/21 2013)  iohexol (OMNIPAQUE) 350 MG/ML injection 55 mL (55 mLs Intravenous Contrast Given 07/08/21 2232)  fentaNYL (SUBLIMAZE) injection 50 mcg (50 mcg Intravenous Given 07/08/21 2341)    ED Course  I have reviewed the triage vital signs and the nursing notes.  Pertinent labs & imaging results that were available during my care of the patient were reviewed by me and considered in my medical decision making (see chart for details).    MDM Rules/Calculators/A&P                           KORA GROOM is a 70 y.o. female with a past medical history significant for hypertension, hyperlipidemia, hypercholesterolemia, asthma, osteoporosis, and GERD who presents with upper abdominal pain and lower chest discomfort.  According to patient, for the last week she has been having discomfort across her mid torso.  It is an aching and it is constant but waxes and wanes.  She does think is worse with deep breathing and certain positions.  She has had some nausea and vomiting but not constant.  She denies any constipation, diarrhea, or urinary changes.  Denies any trauma.  She still has bruising on her forehead from when I saw her several days ago but she is denying any headache.  She denies any neurologic complaints.  She reports he has felt fatigued and tired.  She reports she has scrapes on both of her forearms from when she fell on an escalator several days ago but is not having new pain in her arms.  Denies  other complaints.  Of note, patient arrived with a friend whom she found several days ago after discharge when I saw her several days ago.  Patient is currently homeless and has been having difficulty navigating what shelter and options she has available to her.  When I last saw her and discharge her, the social work team at [employer] helped have her go to the [facility] for housing although it appears that patient was unable to do this.  On exam, patient's lungs are clear.  Lower chest is slightly tender to palpation as is her epigastric and right upper quadrant.  Otherwise bowel sounds clear.  Lower abdomen nontender.  Back and flanks nontender.  Patient has some scrapes on her forearms with no tenderness.  Normal sensation and strength in all extremities.  Normal finger-nose-finger testing.  Lungs were clear and patient's pupils are symmetric and reactive normal extract movements.  Symmetric smile.  Clear speech.  Patient is some bruising on her forehead which appears similar to several days ago.  Clinically I do feel we need to make sure she does not have a medical cause of her discomfort.  We will get a D-dimer added on given the pleuritic chest discomfort.  We will get a right upper quadrant ultrasound and add on liver enzymes and lipase for the upper abdominal discomfort.  We will also engage the case management/social work team to see what options will  be available depending whether or not she ends up having a need for admission or not.  Anticipate reassessment after work-up.  9:30 PM Ultrasound did not show evidence of gallbladder disease but did confirm the effusion it was seen on the chest x-ray.  Due to the D-dimer the just came back elevated, will get a CT PE study both to rule out PE as well as further evaluate the effusion.  Otherwise ammonia not elevated.  Function panel showed no LFT elevation, mild leukocytosis and mild anemia present.  Creatinine not elevated.  Lipase not elevated.  INR  normal.  TSH normal.  Patient just waiting on the CT PE study, urinalysis, and delta troponin.  Anticipate disposition after work-up is completed.  If we do not find a reason to admit patient, will touch base again with the case management/social work team.  11:15 PM CT scan returned showing she does have multiple acute rib fractures and a moderate right-sided hemothorax.  I suspect this is the cause of her discomfort and also led to her exertional shortness of breath when walking outside to get her [employer].  She also has a sternal fracture she has chronic thoracic spine fractures but on my reassessment she has no.  Midline back tenderness or back pain currently.  Due to her exertional shortness of breath, the new rib fractures and sternal fracture, and the moderate hemothorax, will call trauma for recommendations.  Anticipate admission for further management.   11:27 PM Just spoke with Dr. Andrey Campanile with trauma and he suspects that as it has been over a week since the injury took place, the blood in his hemothorax would likely not be amenable to a normal chest tube.  He feels that this patient would best be managed by a cardiothoracic surgery consultation and medicine admit.  Will call CT surgery for recommendations.  11:30 PM On reassessment patient is now hypoxic.  Will call CT surgery and medicine team after confirming a plan.   11:36 PM Just spoke to Dr. Cliffton Asters with CT surgery who feels that a pigtail chest tube is reasonable to start with and that trauma surgery is able to manage this patient.  They report that if things do not improve, they would see the patient in consultation but feel a initial chest tube should be placed first.  Will call trauma again and discuss plan.   11:43 PM Spoke to Dr. Andrey Campanile again with trauma who request the emergency department placed the chest tube and they will follow in the morning for chest tube management but feels this patient needs a medicine  admission as she is over a week from her trauma.  Will call medicine for admission and will attempt placing chest tube in the emergency department at this time.   12:15 AM While attempting to get consent for the procedure for the pigtail chest tube to be placed by me in the emergency department, patient reports that she does not want [place] to put it in at this time as she would rather remain on oxygen and see the cardiothoracic surgery team in the morning for more definitive management.  She is currently refusing the emergency team putting in a pigtail chest tube as she is a retired Engineer, civil (consulting) and thinks that a pigtail chest tube may not fix the blood in her right chest.  We discussed that if she was to acutely decompensate, she would need a chest tube performed by which ever provider was available and patient agrees that if she  decompensates she would except 1 but is currently refusing a chest tube  bias currently.  Spoke with medicine who will see the patient for admission for likely CT surgery consultation in the morning.  She has improvement in oxygen saturations now that she is on 2 L.  Her blood work returned and she does have a three-point hemoglobin drop since last month but she refused blood work when I saw her several days ago so do not have a more recent number for comparison.  Medicine will admit.   Final Clinical Impression(s) / ED Diagnoses Final diagnoses:  Epigastric abdominal pain  RUQ abdominal pain  Hemothorax on right  Closed fracture of multiple ribs of right side, initial encounter  Exertional shortness of breath    Clinical Impression: 1. Hemothorax on right   2. Epigastric abdominal pain   3. RUQ abdominal pain   4. Closed fracture of multiple ribs of right side, initial encounter   5. Exertional shortness of breath     Disposition: Admit  This note was prepared with assistance of Dragon voice recognition software. Occasional wrong-word or sound-a-like substitutions may  have occurred due to the inherent limitations of voice recognition software.     Mylene Bow, Canary Brim, MD 07/09/21 [phone]

## 2021-07-08 NOTE — ED Notes (Signed)
Pt not in room to get vitals. Will reattempt when pt in room.

## 2021-07-08 NOTE — ED Provider Notes (Signed)
Emergency Medicine Provider Triage Evaluation Note  [name] , a 70 y.o. female  was evaluated in triage.  Pt complains of bilateral rib pain x4 days without associated shortness of breath or palpitations.  Pain worsened with movement and deep inspiration.  Denies any recent trauma..  Review of Systems  Positive: Rib pain Negative: Nausea, vomiting, diarrhea, fevers, chills  Physical Exam  BP 117/80   Pulse 60   Temp 97.9 F (36.6 C)   Resp 18   SpO2 98%  Gen:   Awake, no distress   Resp:  Normal effort  MSK:   Moves extremities without difficulty  Other:  Lungs CTA B.  RRR no M/R/G.  Tenderness palpation of the anterior bilateral ribs   Medical Decision Making  Medically screening exam initiated at 1:56 PM.  Appropriate orders placed.  [name] was informed that the remainder of the evaluation will be completed by another provider, this initial triage assessment does not replace that evaluation, and the importance of remaining in the ED until their evaluation is complete.  This chart was dictated using voice recognition software, Dragon. Despite the best efforts of this provider to proofread and correct errors, errors may still occur which can change documentation meaning.    Paris Lore, PA-C 07/08/21 1357    Mancel Bale, MD 07/08/21 1807

## 2021-07-09 ENCOUNTER — Inpatient Hospital Stay (HOSPITAL_COMMUNITY): Payer: Medicare Other

## 2021-07-09 ENCOUNTER — Other Ambulatory Visit: Payer: Self-pay

## 2021-07-09 DIAGNOSIS — J45909 Unspecified asthma, uncomplicated: Secondary | ICD-10-CM | POA: Diagnosis present

## 2021-07-09 DIAGNOSIS — D62 Acute posthemorrhagic anemia: Secondary | ICD-10-CM

## 2021-07-09 DIAGNOSIS — J9601 Acute respiratory failure with hypoxia: Secondary | ICD-10-CM

## 2021-07-09 DIAGNOSIS — Z96652 Presence of left artificial knee joint: Secondary | ICD-10-CM | POA: Diagnosis present

## 2021-07-09 DIAGNOSIS — J942 Hemothorax: Secondary | ICD-10-CM | POA: Diagnosis not present

## 2021-07-09 DIAGNOSIS — T8549XA Other mechanical complication of breast prosthesis and implant, initial encounter: Secondary | ICD-10-CM | POA: Diagnosis present

## 2021-07-09 DIAGNOSIS — F32A Depression, unspecified: Secondary | ICD-10-CM | POA: Diagnosis present

## 2021-07-09 DIAGNOSIS — G8929 Other chronic pain: Secondary | ICD-10-CM | POA: Diagnosis present

## 2021-07-09 DIAGNOSIS — M810 Age-related osteoporosis without current pathological fracture: Secondary | ICD-10-CM | POA: Diagnosis present

## 2021-07-09 DIAGNOSIS — R4189 Other symptoms and signs involving cognitive functions and awareness: Secondary | ICD-10-CM

## 2021-07-09 DIAGNOSIS — Y812 Prosthetic and other implants, materials and accessory general- and plastic-surgery devices associated with adverse incidents: Secondary | ICD-10-CM | POA: Diagnosis present

## 2021-07-09 DIAGNOSIS — Z90710 Acquired absence of both cervix and uterus: Secondary | ICD-10-CM | POA: Diagnosis not present

## 2021-07-09 DIAGNOSIS — S2222XA Fracture of body of sternum, initial encounter for closed fracture: Secondary | ICD-10-CM | POA: Diagnosis present

## 2021-07-09 DIAGNOSIS — M47812 Spondylosis without myelopathy or radiculopathy, cervical region: Secondary | ICD-10-CM | POA: Diagnosis present

## 2021-07-09 DIAGNOSIS — I10 Essential (primary) hypertension: Secondary | ICD-10-CM | POA: Diagnosis present

## 2021-07-09 DIAGNOSIS — I341 Nonrheumatic mitral (valve) prolapse: Secondary | ICD-10-CM | POA: Diagnosis present

## 2021-07-09 DIAGNOSIS — S22070A Wedge compression fracture of T9-T10 vertebra, initial encounter for closed fracture: Secondary | ICD-10-CM

## 2021-07-09 DIAGNOSIS — S271XXA Traumatic hemothorax, initial encounter: Secondary | ICD-10-CM | POA: Diagnosis present

## 2021-07-09 DIAGNOSIS — F17210 Nicotine dependence, cigarettes, uncomplicated: Secondary | ICD-10-CM | POA: Diagnosis present

## 2021-07-09 DIAGNOSIS — S2241XA Multiple fractures of ribs, right side, initial encounter for closed fracture: Secondary | ICD-10-CM | POA: Diagnosis present

## 2021-07-09 DIAGNOSIS — Z20822 Contact with and (suspected) exposure to covid-19: Secondary | ICD-10-CM | POA: Diagnosis present

## 2021-07-09 DIAGNOSIS — Z96642 Presence of left artificial hip joint: Secondary | ICD-10-CM | POA: Diagnosis present

## 2021-07-09 DIAGNOSIS — R079 Chest pain, unspecified: Secondary | ICD-10-CM | POA: Diagnosis present

## 2021-07-09 DIAGNOSIS — K219 Gastro-esophageal reflux disease without esophagitis: Secondary | ICD-10-CM | POA: Diagnosis present

## 2021-07-09 DIAGNOSIS — S0083XA Contusion of other part of head, initial encounter: Secondary | ICD-10-CM | POA: Diagnosis present

## 2021-07-09 DIAGNOSIS — Z5900 Homelessness unspecified: Secondary | ICD-10-CM | POA: Diagnosis not present

## 2021-07-09 DIAGNOSIS — M4804 Spinal stenosis, thoracic region: Secondary | ICD-10-CM | POA: Diagnosis present

## 2021-07-09 DIAGNOSIS — E7800 Pure hypercholesterolemia, unspecified: Secondary | ICD-10-CM | POA: Diagnosis present

## 2021-07-09 DIAGNOSIS — W1830XA Fall on same level, unspecified, initial encounter: Secondary | ICD-10-CM | POA: Diagnosis present

## 2021-07-09 DIAGNOSIS — F419 Anxiety disorder, unspecified: Secondary | ICD-10-CM

## 2021-07-09 LAB — URINALYSIS, ROUTINE W REFLEX MICROSCOPIC
Bacteria, UA: NONE SEEN
Bilirubin Urine: NEGATIVE
Glucose, UA: NEGATIVE mg/dL
Ketones, ur: NEGATIVE mg/dL
Nitrite: NEGATIVE
Protein, ur: NEGATIVE mg/dL
Specific Gravity, Urine: >1.046 — ABNORMAL HIGH (ref 1.005–1.030)
pH: 5 (ref 5.0–8.0)

## 2021-07-09 LAB — HEMOGLOBIN AND HEMATOCRIT, BLOOD
HCT: 28.3 % — ABNORMAL LOW (ref 36.0–46.0)
HCT: 26.5 % — ABNORMAL LOW (ref 36.0–46.0)
HCT: 27.4 % — ABNORMAL LOW (ref 36.0–46.0)
HCT: 26 % — ABNORMAL LOW (ref 36.0–46.0)
Hemoglobin: 8.9 g/dL — ABNORMAL LOW (ref 12.0–15.0)
Hemoglobin: 8.6 g/dL — ABNORMAL LOW (ref 12.0–15.0)
Hemoglobin: 8.6 g/dL — ABNORMAL LOW (ref 12.0–15.0)
Hemoglobin: 8.5 g/dL — ABNORMAL LOW (ref 12.0–15.0)

## 2021-07-09 LAB — RAPID URINE DRUG SCREEN, HOSP PERFORMED
Amphetamines: NOT DETECTED
Barbiturates: NOT DETECTED
Benzodiazepines: NOT DETECTED
Cocaine: NOT DETECTED
Opiates: NOT DETECTED
Tetrahydrocannabinol: NOT DETECTED

## 2021-07-09 LAB — TYPE AND SCREEN
ABO/RH(D): O POS
Antibody Screen: NEGATIVE

## 2021-07-09 LAB — RESP PANEL BY RT-PCR (FLU A&B, COVID) ARPGX2
Influenza A by PCR: NEGATIVE
Influenza B by PCR: NEGATIVE
SARS Coronavirus 2 by RT PCR: NEGATIVE

## 2021-07-09 LAB — TROPONIN I (HIGH SENSITIVITY): Troponin I (High Sensitivity): 8 ng/L (ref <18)

## 2021-07-09 MED ORDER — LIDOCAINE HCL 2 % IJ SOLN
20.0000 mL | Freq: Once | INTRAMUSCULAR | Status: AC
  Administered 2021-07-09: 400 mg
  Filled 2021-07-09: qty 20

## 2021-07-09 MED ORDER — LORAZEPAM 2 MG/ML IJ SOLN
0.5000 mg | INTRAMUSCULAR | Status: AC
  Administered 2021-07-09: 0.5 mg via INTRAVENOUS
  Filled 2021-07-09: qty 1

## 2021-07-09 MED ORDER — HYDROCODONE-ACETAMINOPHEN 5-325 MG PO TABS
1.0000 | ORAL_TABLET | ORAL | Status: DC | PRN
Start: 2021-07-09 — End: 2021-07-15
  Administered 2021-07-09 – 2021-07-15 (×23): 1 via ORAL
  Filled 2021-07-09 (×23): qty 1

## 2021-07-09 MED ORDER — MORPHINE SULFATE (PF) 2 MG/ML IV SOLN
1.0000 mg | INTRAVENOUS | Status: DC | PRN
  Administered 2021-07-09 (×2): 1 mg via INTRAVENOUS
  Administered 2021-07-12: 2 mg via INTRAVENOUS
  Administered 2021-07-16 (×2): 1 mg via INTRAVENOUS
  Administered 2021-07-16: 2 mg via INTRAVENOUS
  Administered 2021-07-16 – 2021-07-18 (×9): 1 mg via INTRAVENOUS
  Filled 2021-07-09 (×15): qty 1

## 2021-07-09 MED ORDER — ENOXAPARIN SODIUM 30 MG/0.3ML IJ SOSY
30.0000 mg | PREFILLED_SYRINGE | Freq: Two times a day (BID) | INTRAMUSCULAR | Status: DC
  Administered 2021-07-09 – 2021-07-19 (×21): 30 mg via SUBCUTANEOUS
  Filled 2021-07-09 (×21): qty 0.3

## 2021-07-09 MED ORDER — MORPHINE SULFATE (PF) 2 MG/ML IV SOLN
2.0000 mg | INTRAVENOUS | Status: AC
  Administered 2021-07-09 (×2): 2 mg via INTRAVENOUS
  Filled 2021-07-09 (×2): qty 1

## 2021-07-09 MED ORDER — MIRTAZAPINE 15 MG PO TABS
15.0000 mg | ORAL_TABLET | Freq: Every day | ORAL | Status: DC
  Administered 2021-07-09 – 2021-07-19 (×12): 15 mg via ORAL
  Filled 2021-07-09 (×13): qty 1

## 2021-07-09 MED ORDER — VENLAFAXINE HCL ER 75 MG PO CP24
150.0000 mg | ORAL_CAPSULE | Freq: Every day | ORAL | Status: DC
  Administered 2021-07-09 – 2021-07-15 (×7): 150 mg via ORAL
  Filled 2021-07-09 (×3): qty 2
  Filled 2021-07-09: qty 1
  Filled 2021-07-09 (×4): qty 2

## 2021-07-09 MED ORDER — BUPROPION HCL ER (XL) 150 MG PO TB24
150.0000 mg | ORAL_TABLET | Freq: Every day | ORAL | Status: DC
  Administered 2021-07-09 – 2021-07-19 (×12): 150 mg via ORAL
  Filled 2021-07-09 (×13): qty 1

## 2021-07-09 MED ORDER — METHOCARBAMOL 750 MG PO TABS
750.0000 mg | ORAL_TABLET | Freq: Three times a day (TID) | ORAL | Status: DC | PRN
  Administered 2021-07-09 – 2021-07-17 (×18): 750 mg via ORAL
  Filled 2021-07-09 (×18): qty 1

## 2021-07-09 MED ORDER — LORAZEPAM 1 MG PO TABS
1.0000 mg | ORAL_TABLET | Freq: Two times a day (BID) | ORAL | Status: DC | PRN
  Administered 2021-07-11 – 2021-07-19 (×9): 1 mg via ORAL
  Filled 2021-07-09 (×12): qty 1

## 2021-07-09 NOTE — ED Notes (Signed)
Pt educated on using call bell and not getting out of bed d/t pt safety and chest tube being in place.

## 2021-07-09 NOTE — ED Notes (Signed)
Called for staffing for sitter for safety, per staffing no sitters available at this time

## 2021-07-09 NOTE — ED Notes (Signed)
Transported to CT 

## 2021-07-09 NOTE — ED Notes (Signed)
Pt educated again regarding high fall risk, and need to not get out of bed by herself. Pt asked if she could unhook herself and this RN educated her on need to keep all monitoring equipment on and only let staff unhook her from equipment. Pt verbalized understanding to stay in the bed and not unhook herself from equipment.

## 2021-07-09 NOTE — ED Notes (Signed)
Pt noted to have removed BP cuff. Placed BP cuff back on pt and re-educated pt to keep equipment on so she can be properly monitored. Pt verbalized understanding

## 2021-07-09 NOTE — ED Notes (Signed)
Attempted to call report x 1  

## 2021-07-09 NOTE — Final Consult Note (Signed)
Reason for Consult/Chief Complaint: hemothorax Consultant: Raeanne Gathers, MD  [name] is an 70 y.o. female.   HPI: 55F who is amnestic to the event, so history is obtained from EDMD and chart review. Reportedly, patient had a fall ~1w ago. She presented to the ED 10/15 for mechanical GLF. She had a rib series completed which was negative. She re-presented 10/16 for persistent pain, 10/18 for a chief complaint of "homelessness", 10/20 for n/v, and again 10/24 for upper abdominal/lower chest pain. She underwent chest CT today which was notable for rib fx 8-9, sternal fx, moderate HTX, and worsened T10 VB fx. Unclear if additional trauma occurred since initial presentation.   Past Medical History:  Diagnosis Date   Anxiety    Arthritis    back, joints   Asthma    Chronic pain    Constipation    Depression    Dysrhythmia    Palpitations   GERD (gastroesophageal reflux disease)    High cholesterol    Hypertension    resolved whn she stopped drinking alcohol   Mitral prolapse    dx as a child   Osteopenia    Osteoporosis     Past Surgical History:  Procedure Laterality Date   ABDOMINAL HYSTERECTOMY  1985   BACK SURGERY     BREAST SURGERY     augmentation   HARDWARE REMOVAL  02/27/2012   Procedure: HARDWARE REMOVAL;  Surgeon: Kerrin Champagne, MD;  Location: [facility] OR;  Service: Orthopedics;;  exploration and removal of portion of screw at T-!   JOINT REPLACEMENT Right    shoulder   left hip replacement  02-27-12   just screw - no replacement   NECK SURGERY     plates and screws   ORIF WRIST FRACTURE  1986   right   right femur replacement     ROTATOR CUFF REPAIR     right   SHOULDER HEMI-ARTHROPLASTY  02/27/2012   Procedure: SHOULDER HEMI-ARTHROPLASTY;  Surgeon: Kerrin Champagne, MD;  Location: [facility] OR;  Service: Orthopedics;  Laterality: Right;  Right shoulder hemiarthroplasty, repair right rotator cuff   TONSILLECTOMY     TOTAL KNEE ARTHROPLASTY Left 03/27/2020   TOTAL KNEE  ARTHROPLASTY Left 03/27/2020   Procedure: LEFT TOTAL KNEE ARTHROPLASTY;  Surgeon: Cammy Copa, MD;  Location: [facility] OR;  Service: Orthopedics;  Laterality: Left;   WRIST SURGERY     tendon repair from a dog bite- left    No family history on file.  Social History:  reports that she has been smoking cigarettes. She has a 25.00 pack-year smoking history. She has never used smokeless tobacco. She reports current alcohol use. She reports that she does not use drugs.  Allergies:  Allergies  Allergen Reactions   Penicillins Rash and Other (See Comments)    And, from childhood: Had at 70 yrs old and fainted ("with the sight of the large needle.") Has tolerated oral cephalexin and intraoperative cefazolin without issues.      Vancomycin Rash and Other (See Comments)    "I turned red"     Medications: I have reviewed the patient's current medications.  Results for orders placed or performed during the hospital encounter of 07/08/21 (from the past 48 hour(s))  Basic metabolic panel     Status: Abnormal   Collection Time: 07/08/21  1:06 PM  Result Value Ref Range   Sodium 136 135 - 145 mmol/L   Potassium 3.9 3.5 - 5.1 mmol/L   Chloride  104 98 - 111 mmol/L   CO2 20 (L) 22 - 32 mmol/L   Glucose, Bld 127 (H) 70 - 99 mg/dL    Comment: Glucose reference range applies only to samples taken after fasting for at least 8 hours.   BUN 24 (H) 8 - 23 mg/dL   Creatinine, Ser 1.61 0.44 - 1.00 mg/dL   Calcium 9.0 8.9 - 09.6 mg/dL   GFR, Estimated >04 >54 mL/min    Comment: (NOTE) Calculated using the CKD-EPI Creatinine Equation (2021)    Anion gap 12 5 - 15    Comment: Performed at [facility], [address], [place], [place] [address]  CBC     Status: Abnormal   Collection Time: 07/08/21  1:06 PM  Result Value Ref Range   WBC 11.3 (H) 4.0 - 10.5 K/uL   RBC 3.45 (L) 3.87 - 5.11 MIL/uL   Hemoglobin 10.3 (L) 12.0 - 15.0 g/dL   HCT 91.4 (L) 78.2 - 95.6 %   MCV 95.4 80.0 - 100.0 fL    MCH 29.9 26.0 - 34.0 pg   MCHC 31.3 30.0 - 36.0 g/dL   RDW 21.3 08.6 - 57.8 %   Platelets 565 (H) 150 - 400 K/uL   nRBC 0.0 0.0 - 0.2 %    Comment: Performed at [facility], [address] [place], [place] [address]  Troponin I (High Sensitivity)     Status: None   Collection Time: 07/08/21  1:06 PM  Result Value Ref Range   Troponin I (High Sensitivity) 6 <18 ng/L    Comment: (NOTE) Elevated high sensitivity troponin I (hsTnI) values and significant  changes across serial measurements may suggest ACS but many other  chronic and acute conditions are known to elevate hsTnI results.  Refer to the "Links" section for chest pain algorithms and additional  guidance. Performed at [facility], [address], [place], [place] [address]   Hepatic function panel     Status: Abnormal   Collection Time: 07/08/21  4:53 PM  Result Value Ref Range   Total Protein 5.6 (L) 6.5 - 8.1 g/dL   Albumin 3.0 (L) 3.5 - 5.0 g/dL   AST 17 15 - 41 U/L   ALT 9 0 - 44 U/L   Alkaline Phosphatase 73 38 - 126 U/L   Total Bilirubin 0.2 (L) 0.3 - 1.2 mg/dL   Bilirubin, Direct 0.2 0.0 - 0.2 mg/dL   Indirect Bilirubin 0.0 (L) 0.3 - 0.9 mg/dL    Comment: Performed at [facility], [address], [place], [place] [address]  Lipase, blood     Status: None   Collection Time: 07/08/21  4:53 PM  Result Value Ref Range   Lipase 25 11 - 51 U/L    Comment: Performed at [facility], [address], [place], [place] [address]  D-dimer, quantitative     Status: Abnormal   Collection Time: 07/08/21  4:53 PM  Result Value Ref Range   D-Dimer, Quant 1.56 (H) 0.00 - 0.50 ug/mL-FEU    Comment: (NOTE) At the manufacturer cut-off value of 0.5 g/mL FEU, this assay has a negative predictive value of 95-100%.This assay is intended for use in conjunction with a clinical pretest probability (PTP) assessment model to exclude pulmonary embolism (PE) and deep venous thrombosis (DVT) in outpatients  suspected of PE or DVT. Results should be correlated with clinical presentation. Performed at [facility], [address],  [place], [place] [address]   Protime-INR     Status: None   Collection Time: 07/08/21  4:53 PM  Result Value Ref Range   Prothrombin Time 14.1 11.4 - 15.2 seconds   INR 1.1 0.8 - 1.2    Comment: (NOTE) INR goal varies based on device and disease states. Performed at [facility], [address], [place], [place] [address]   Ammonia     Status: None   Collection Time: 07/08/21  4:54 PM  Result Value Ref Range   Ammonia 22 9 - 35 umol/L    Comment: Performed at [facility], [address], [place], [place] [address]  TSH     Status: None   Collection Time: 07/08/21  7:52 PM  Result Value Ref Range   TSH 1.621 0.350 - 4.500 uIU/mL    Comment: Performed by a 3rd Generation assay with a functional sensitivity of <=0.01 uIU/mL. Performed at [facility], [address] [place], [place] [address]   Urinalysis, Routine w reflex microscopic Urine, Clean Catch     Status: Abnormal   Collection Time: 07/09/21 12:36 AM  Result Value Ref Range   Color, Urine YELLOW YELLOW   APPearance CLEAR CLEAR   Specific Gravity, Urine >1.046 (H) 1.005 - 1.030   pH 5.0 5.0 - 8.0   Glucose, UA NEGATIVE NEGATIVE mg/dL   Hgb urine dipstick SMALL (A) NEGATIVE   Bilirubin Urine NEGATIVE NEGATIVE   Ketones, ur NEGATIVE NEGATIVE mg/dL   Protein, ur NEGATIVE NEGATIVE mg/dL   Nitrite NEGATIVE NEGATIVE   Leukocytes,Ua TRACE (A) NEGATIVE   RBC / HPF 21-50 0 - 5 RBC/hpf   WBC, UA 6-10 0 - 5 WBC/hpf   Bacteria, UA NONE SEEN NONE SEEN   Squamous Epithelial / LPF 0-5 0 - 5    Comment: Performed at [facility], [address], [place], [place] [address]  Urine rapid drug screen (hosp performed)     Status: None   Collection Time: 07/09/21 12:36 AM  Result Value Ref Range   Opiates NONE DETECTED NONE DETECTED   Cocaine NONE DETECTED NONE DETECTED    Benzodiazepines NONE DETECTED NONE DETECTED   Amphetamines NONE DETECTED NONE DETECTED   Tetrahydrocannabinol NONE DETECTED NONE DETECTED   Barbiturates NONE DETECTED NONE DETECTED    Comment: (NOTE) DRUG SCREEN FOR MEDICAL PURPOSES ONLY.  IF CONFIRMATION IS NEEDED FOR ANY PURPOSE, NOTIFY LAB WITHIN 5 DAYS.  LOWEST DETECTABLE LIMITS FOR URINE DRUG SCREEN Drug Class                     Cutoff (ng/mL) Amphetamine and metabolites    1000 Barbiturate and metabolites    200 Benzodiazepine                 200 Tricyclics and metabolites     300 Opiates and metabolites        300 Cocaine and metabolites        300 THC                            50 Performed at [facility], [address], [place], [place] [address]   Hemoglobin and hematocrit, blood     Status: Abnormal   Collection Time: 07/09/21  1:28 AM  Result Value Ref Range   Hemoglobin 8.9 (L) 12.0 - 15.0 g/dL   HCT 62.2 (L) 63.3 - 35.4 %  Comment: Performed at [facility], [address], [place], [place] [address]  Type and screen [facility]     Status: None   Collection Time: 07/09/21  1:28 AM  Result Value Ref Range   ABO/RH(D) O POS    Antibody Screen NEG    Sample Expiration      07/12/2021,2359 Performed at [facility], [address] [place], [place] [address]   Resp Panel by RT-PCR (Flu A&B, Covid) Nasopharyngeal Swab     Status: None   Collection Time: 07/09/21  1:29 AM   Specimen: Nasopharyngeal Swab; Nasopharyngeal(NP) swabs in vial transport medium  Result Value Ref Range   SARS Coronavirus 2 by RT PCR NEGATIVE NEGATIVE    Comment: (NOTE) SARS-CoV-2 target nucleic acids are NOT DETECTED.  The SARS-CoV-2 RNA is generally detectable in upper respiratory specimens during the acute phase of infection. The lowest concentration of SARS-CoV-2 viral copies this assay can detect is 138 copies/mL. A negative result does not preclude SARS-Cov-2 infection and should not be  used as the sole basis for treatment or other patient management decisions. A negative result may occur with  improper specimen collection/handling, submission of specimen other than nasopharyngeal swab, presence of viral mutation(s) within the areas targeted by this assay, and inadequate number of viral copies(<138 copies/mL). A negative result must be combined with clinical observations, patient history, and epidemiological information. The expected result is Negative.  Fact Sheet for Patients:  BloggerCourse.com  Fact Sheet for Healthcare Providers:  SeriousBroker.it  This test is no t yet approved or cleared by the [place] FDA and  has been authorized for detection and/or diagnosis of SARS-CoV-2 by FDA under an Emergency Use Authorization (EUA). This EUA will remain  in effect (meaning this test can be used) for the duration of the COVID-19 declaration under Section 564(b)(1) of the Act, 21 U.S.C.section 360bbb-3(b)(1), unless the authorization is terminated  or revoked sooner.       Influenza A by PCR NEGATIVE NEGATIVE   Influenza B by PCR NEGATIVE NEGATIVE    Comment: (NOTE) The Xpert Xpress SARS-CoV-2/FLU/RSV plus assay is intended as an aid in the diagnosis of influenza from Nasopharyngeal swab specimens and should not be used as a sole basis for treatment. Nasal washings and aspirates are unacceptable for Xpert Xpress SARS-CoV-2/FLU/RSV testing.  Fact Sheet for Patients: BloggerCourse.com  Fact Sheet for Healthcare Providers: SeriousBroker.it  This test is not yet approved or cleared by the [place] FDA and has been authorized for detection and/or diagnosis of SARS-CoV-2 by FDA under an Emergency Use Authorization (EUA). This EUA will remain in effect (meaning this test can be used) for the duration of the COVID-19 declaration under Section 564(b)(1) of the  Act, 21 U.S.C. section 360bbb-3(b)(1), unless the authorization is terminated or revoked.  Performed at [facility], [address], [place], [place] [address]   Hemoglobin and hematocrit, blood     Status: Abnormal   Collection Time: 07/09/21  2:50 AM  Result Value Ref Range   Hemoglobin 8.6 (L) 12.0 - 15.0 g/dL   HCT 93.7 (L) 16.9 - 67.8 %    Comment: Performed at [facility], [address], [place], [place] [address]  Troponin I (High Sensitivity)     Status: None   Collection Time: 07/09/21  4:01 AM  Result Value Ref Range   Troponin I (High Sensitivity) 8 <18 ng/L    Comment: (NOTE) Elevated high sensitivity  troponin I (hsTnI) values and significant  changes across serial measurements may suggest ACS but many other  chronic and acute conditions are known to elevate hsTnI results.  Refer to the "Links" section for chest pain algorithms and additional  guidance. Performed at [facility], [address], [place], [place] [address]   Hemoglobin and hematocrit, blood     Status: Abnormal   Collection Time: 07/09/21  4:01 AM  Result Value Ref Range   Hemoglobin 8.6 (L) 12.0 - 15.0 g/dL   HCT 36.6 (L) 44.0 - 34.7 %    Comment: Performed at [facility], [address] [place], [place] [address]  Hemoglobin and hematocrit, blood     Status: Abnormal   Collection Time: 07/09/21  6:33 AM  Result Value Ref Range   Hemoglobin 8.5 (L) 12.0 - 15.0 g/dL   HCT 63.8 (L) 75.6 - 43.3 %    Comment: Performed at [facility], [address] [place], [place] [address]    DG Chest 2 View  Result Date: 07/08/2021 CLINICAL DATA:  Bilateral rib pain. EXAM: CHEST - 2 VIEW COMPARISON:  Chest x-ray dated June 29, 2021. FINDINGS: Normal heart size. New moderate right pleural effusion right middle and lower lobe atelectasis. No pneumothorax. No acute osseous abnormality. Old right-sided rib fractures. IMPRESSION: 1. New moderate right pleural effusion with  right middle and lower lobe atelectasis. Electronically Signed   By: Obie Dredge M.D.   On: 07/08/2021 14:42   CT Angio Chest PE W and/or Wo Contrast  Result Date: 07/08/2021 CLINICAL DATA:  Pt endorses bilateral rib pain since Friday. Denies SOB, but has pain with deep breathing. Denies any falls. EXAM: CT ANGIOGRAPHY CHEST WITH CONTRAST TECHNIQUE: Multidetector CT imaging of the chest was performed using the standard protocol during bolus administration of intravenous contrast. Multiplanar CT image reconstructions and MIPs were obtained to evaluate the vascular anatomy. CONTRAST:  64mL OMNIPAQUE IOHEXOL 350 MG/ML SOLN COMPARISON:  Chest x-ray 06/29/2021, CT chest 06/14/2020 FINDINGS: Cardiovascular: Satisfactory opacification of the pulmonary arteries to the segmental level. No evidence of pulmonary embolism. Normal heart size. No significant pericardial effusion. The thoracic aorta is normal in caliber. Mild atherosclerotic plaque of the thoracic aorta. At least 2 vessel coronary artery calcifications. Mediastinum/Nodes: No enlarged mediastinal, hilar, or axillary lymph nodes. Thyroid gland, trachea, and esophagus demonstrate no significant findings. Small to moderate volume hiatal hernia. Lungs/Pleura: Partial collapse of the right lower lobe. No focal consolidation. No pulmonary nodule. No pulmonary mass. Moderate volume right pleural fluid with hyperdensity along the rib fractures consistent with a hemothorax. No left pleural effusion. No pneumothorax. Upper Abdomen: No acute abnormality. Musculoskeletal: Intra and extracapsular rupture of a bilateral breast implants. Associated circumferential calcifications. Chronic, but worsened from 06/14/2020, compression fracture of the T10 vertebral body with approximately 50% vertebral body height loss. Multiple other thoracic vertebral bodies demonstrate decreased vertebral body height loss. Old healed left rib fractures.  Old healed right rib fractures.  Interval development of an acute right posterior eighth and ninth rib fractures (compared to CT chest 06/14/2020, retrospectively these may have been present on x-ray ribs 06/29/2021). Acute minimally displaced mid sternal body fracture. Right shoulder arthroplasty partially visualized. Review of the MIP images confirms the above findings. IMPRESSION: 1. No pulmonary embolus. 2. Acute posterior right 8 and 9 rib fractures with associated moderate volume hemothorax. Retrospectively fractures may have been present on x-ray ribs 06/29/2021. 3. Acute minimally displaced mid sternal body fracture. 4.  Small to moderate volume hiatal hernia. 5. Chronic, but worsened from 06/14/2020, compression fracture of the T10 vertebral body with approximately 50% vertebral body height loss. Recommend correlation with point tenderness to palpation for an acute component. 6. 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 Correlate with mammography. 7.  Aortic Atherosclerosis (ICD10-I70.0). Electronically Signed   By: Tish Frederickson M.D.   On: 07/08/2021 23:00   US Abdomen Limited RUQ (LIVER/GB)  Result Date: 07/08/2021 CLINICAL DATA:  Right upper quadrant abdominal pain, nausea vomiting. EXAM: ULTRASOUND ABDOMEN LIMITED RIGHT UPPER QUADRANT COMPARISON:  None. FINDINGS: Gallbladder: No gallstones or wall thickening visualized. No sonographic Murphy sign noted by sonographer. Small focus of adenomyomatosis. Common bile duct: Diameter: 5 mm Liver: No focal lesion identified. Within normal limits in parenchymal echogenicity. Portal vein is patent on color Doppler imaging with normal direction of blood flow towards the liver. Other: Small right pleural effusion. IMPRESSION: Small right pleural effusion, otherwise unremarkable right upper quadrant ultrasound. Electronically Signed   By: Elgie Collard M.D.   On: 07/08/2021 19:57    ROS 10 point review of systems is negative except as listed above in HPI.    Physical Exam Blood pressure (!) 139/45, pulse 84, temperature 98.6 F (37 C), temperature source Oral, resp. rate 15, height  (1.727 m), weight 56.2 kg, SpO2 100 %. Constitutional: well-developed, well-nourished HEENT: pupils equal, round, reactive to light, 2mm b/l, moist conjunctiva, external inspection of ears and nose normal, hearing intact Oropharynx: normal oropharyngeal mucosa, normal dentition Neck: no thyromegaly, trachea midline, no midline cervical tenderness to palpation Chest: breath sounds equal bilaterally, normal respiratory effort, + midline and R lateral chest wall tenderness to palpation without deformity Abdomen: soft, NT, no bruising, no hepatosplenomegaly GU: normal female genitalia  Back: no wounds, no thoracic/lumbar spine tenderness to palpation, no thoracic/lumbar spine stepoffs Rectal: deferred Extremities: 2+ radial and pedal pulses bilaterally, intact motor and sensation bilateral UE and LE, no peripheral edema MSK: unable to assess gait/station, no clubbing/cyanosis of fingers/toes, normal ROM of all four extremities Skin: warm, dry, no rashes    Assessment/Plan: GLF   R rib fx 8-9 - pain control, IS/pulm toilet R HTX - CT placed in ED, 850cc initial o/p with improved aeration on post CXR, Repeat CXR in AM.  Sternal fx - pain control, pulm toilet Worsened T10 compression fx - no pain on exam, unclear if acute or chronically worsened. NSGY c/s, Dr. Jordan Likes. Eval remainder of spine with CT imaging.  Geriatric trauma - pan-scan for completion of workup FEN - okay for regular diet from surgical standpoint DVT - recommend chemical DVT ppx with lovenox  BID to start today Dispo - admitted to medicine service   Diamantina Monks, MD General and Trauma Surgery [facility]

## 2021-07-09 NOTE — Progress Notes (Signed)
PT Cancellation Note  Patient Details Name: [name] MRN: [id number] DOB: 03-27-1951   Cancelled Treatment:    Reason Eval/Treat Not Completed: Patient at procedure or test/unavailable.  Pt is gone to CT and will retry at another time.   Ivar Drape 07/09/2021, 1:51 PM  Samul Dada, PT PhD Acute Rehab Dept. Number: [facility] [id number] and [facility] [phone]

## 2021-07-09 NOTE — Progress Notes (Signed)
PROGRESS NOTE  [name]  DOB: June 23, 1951  PCP: Paulino Door, MD CHY:[id number]  DOA: 07/08/2021  LOS: 0 days  Hospital Day: 2  Chief Complaint  Patient presents with   Chest Pain    Brief narrative: [name] is a 70 y.o. female with PMH significant for HTN, HLD, chronic smoking, mitral valve prolapse, palpitation, anxiety/depression, asthma, chronic pain, osteopenia, osteoporosis who has been homeless for the last year and a half and has been moving between different shelters in peoples homes. Patient presented to the ED on 10/24 with complaint of right anterior rib pain on deep inspiration ongoing for 4 to 5 days.  In the ED, she was was afebrile, normotensive, initially maintaining oxygen saturation but while in the ED saturation dropped to 70s and she was placed on 2 L oxygen by nasal cannula.  On exam, she was noted to have numerous bruises on forehead and upper extremity but she could not recall what happened.  She also was noted to have difficulty with memory. Labs showed D-dimer elevated 1.56 CTA of the chest was showed right eighth and ninth breath fracture, displaced mid sternal body fracture with large right-sided hemothorax.  Hemoglobin of 10.3 which down from 13.5 in September.   ED physician initially consulted trauma surgeon Dr. Andrey Campanile who felt that hemothorax not amenable to chest tube since injury likely took place over a week ago .  He then advised management by cardiothoracic surgery.  CT surgery Dr. Cliffton Asters was consulted who felt a pigtail chest tube is reasonable and deferred back to trauma surgery.  Trauma surgery then requested ED physician placed chest tube with follow-up management for chest tube in the morning.  However, patient deferred chest tube overnight and wanted to be seen by surgery next morning for further management.   This morning, patient underwent chest tube placement by trauma surgeon Dr. Bedelia Person  Subjective: Patient was seen and examined  this afternoon. Chest tube in place.  Underwent multiple CT scans this afternoon.  Pending report Chart reviewed.  Hemodynamically stable.  Currently on 2 L oxygen.  Assessment/Plan: Hemothorax in the setting of acute right 8 to 9 rib fracture and displaced mid sternal body fracture -Underwent chest tube placement by trauma surgery this morning.     Acute blood loss anemia -Baseline hemoglobin normal, presented with hemoglobin low at 10.3 and overnight dropped to between 8 and 9, has been stable and there for last few readings.   -Continue to monitor, transfuse if less than 7.   Recent Labs    05/31/21 1341 07/08/21 1306 07/09/21 0128 07/09/21 0250 07/09/21 0401 07/09/21 0633  HGB 13.5 10.3* 8.9* 8.6* 8.6* 8.5*  MCV 95.1 95.4  --   --   --   --    Acute hypoxia secondary to large hemothorax -Patient was maintaining saturation on presentation but dropped to 70s while in the ED and required 2 L oxygen.  Continue to wean down as tolerated.   Recent fall Chronic T10 compression fracture -Patient asymptomatic at this time -Per trauma surgery, compression fracture seems worse in recent scan.  Neurosurgery consult was called by trauma surgery.   Chronic intra-extracapsular breast implant rupture -Patient asymptomatic at this time   Cognitive impairment -Pt alert and oriented to self, time and place but unable to give history about current event. -UDS unremarkable   Anxiety/depression - Continue antidepressives, Ativan as needed   Mobility: Encourage ambulation.  PT eval Living condition: Homeless Goals of care:   Code  Status: Full Code  Nutritional status: Body mass index is 18.85 kg/m.     Diet:  Diet Order             Diet regular Room service appropriate? Yes; Fluid consistency: Thin  Diet effective now                  DVT prophylaxis:  enoxaparin (LOVENOX) injection 30 mg Start: 07/09/21 2200 SCDs Start: 07/09/21 0035   Antimicrobials: None Fluid:  None Consultants: Trauma surgery Family Communication: None at bedside  Status is: Inpatient  Remains inpatient appropriate because: Chest tube in  Dispo: The patient is from: Homeless              Anticipated d/c is to: Probably home with shelter              Patient currently is not medically stable to d/c.   Difficult to place patient No     Infusions:    Scheduled Meds:  buPROPion  150 mg Oral QHS   enoxaparin (LOVENOX) injection  30 mg Subcutaneous Q12H   mirtazapine  15 mg Oral QHS   venlafaxine XR  150 mg Oral Q breakfast    PRN meds: HYDROcodone-acetaminophen, LORazepam, methocarbamol, morphine injection   Antimicrobials: Anti-infectives (From admission, onward)    None       Objective: Vitals:   07/09/21 1100 07/09/21 1300  BP: 122/60 (!) 147/81  Pulse: 74 85  Resp: (!) 23 (!) 23  Temp:    SpO2: 99% 97%    Intake/Output Summary (Last 24 hours) at 07/09/2021 1448 Last data filed at 07/09/2021 4944 Gross per 24 hour  Intake --  Output 850 ml  Net -850 ml   Filed Weights   07/08/21 1958  Weight: 56.2 kg   Weight change:  Body mass index is 18.85 kg/m.   Physical Exam: General exam: Pleasant, elderly, pain control. Skin: No rashes, lesions or ulcers.  Multiple areas of bruising on the skin HEENT: Atraumatic, normocephalic, no obvious bleeding Lungs: Chest tube on the right.  Clear to auscultation bilaterally CVS: Regular rate and rhythm, no murmur GI/Abd soft, nontender, nondistended, bowel sound present CNS: Alert, awake, oriented x3 Psychiatry: Mood appropriate Extremities: No pedal edema, no calf tenderness  Data Review: I have personally reviewed the laboratory data and studies available.  F/u labs ordered Unresulted Labs (From admission, onward)     Start     Ordered   07/10/21 0500  CBC  Tomorrow morning,   R        07/09/21 0712   07/10/21 0500  CBC with Differential/Platelet  Tomorrow morning,   R        07/09/21 1448    07/10/21 0500  Basic metabolic panel  Tomorrow morning,   R        07/09/21 1448            Signed, Lorin Glass, MD [facility] 07/09/2021

## 2021-07-09 NOTE — Procedures (Signed)
   Procedure Note  Date: 07/09/2021  Procedure: tube thoracostomy--right    Pre-op diagnosis: right hemothorax  Post-op diagnosis: same  Surgeon: Diamantina Monks, MD  Anesthesia: local   EBL: <5cc procedural; 850cc blood evacuated Drains/Implants: 55F chest tube Specimen: none  Description of procedure: Time-out was performed verifying correct patient, procedure, site, laterality, and signature of informed consent. Twenty cc's of local anesthetic was infiltrated into the tissues just over the fourth intercostal space.  A small skin nick was made at the fourth intercostal space until the pleural cavity was entered. Blood was encountered upon entry and a 55F chest tube was inserted through this tract. An introducer needle was inserted and a guidewire inserted through the needle. The needle was removed and the tract dilated. The chest tube was inserted over the guidewire and the guidewire removed.   The tube was secured at the skin with suture and connected to an atrium at -20cm water wall suction. Immediate output from the chest tube was 850cc and was bloody. The site was dressed with gauze, and tape. The patient tolerated the procedure well. There were no complications. Follow up chest x-ray was ordered to confirm tube positioning, complete evacuation, and complete lung re-expansion.    Diamantina Monks, MD General and Trauma Surgery [facility]

## 2021-07-09 NOTE — ED Notes (Addendum)
Consent obtained and at bedside 

## 2021-07-09 NOTE — H&P (Signed)
History and Physical    ROSALEAH Williams BMS:[id number] DOB: [birth year] DOA: 07/08/2021  PCP: Paulino Door, MD  Patient coming from: Homeless  I have personally briefly reviewed patient's old medical records in [facility]  Chief Complaint: Lower rib  HPI: THEDORA Williams is a 70 y.o. female with medical history significant for hypertension, osteoporosis, anxiety/depression and hyperlipidemia who presents with concerns of lower rib pain.  Patient reports about 4 to 5 days of anterior rib pain especially on the right with deep respiration.  She also has numerous bruises on forehead and upper extremity but she cannot recall what happened.  Also has trouble with memory with conversations with others.  Has been homeless for a year and a half and moves to different shelters in peoples homes.  She last presented to the ED on 10/20 with nausea and vomiting but declined any lab work or head imaging.  She was stable and discharged with antiemetics.  She denies any current symptoms of nausea or vomiting.  Endorse half a pack tobacco use since she was 13.  Denies alcohol or illicit drug use.  ED Course: She was afebrile, normotensive but became hypoxic into the 70s and placed on 2 L via nasal cannula.  D-dimer elevated to 1.56.  CTA of the chest was obtained showing right eighth and ninth breath fracture, displaced mid sternal body fracture with large right-sided hemothorax.  Hemoglobin of 10.3 which down from 13.5 in September.  ED physician initially consulted trauma surgeon Dr. Andrey Campanile who felt that hemothorax not amenable to chest tube since injury likely took place over a week ago .  He then advised management by cardiothoracic surgery.  CT surgery Dr. Cliffton Asters was consulted who felt a pigtail chest tube is reasonable and deferred back to trauma surgery.  Trauma surgery then requested ED physician placed chest tube with follow-up management for chest tube in the morning.  However, patient deferred  chest tube overnight and would like to see CT surgery tomorrow for further management.  She understands that if she was to acutely decompensate overnight she is willing to undergo chest tube placement.  Review of Systems: Unable to obtain for ROS since patient not able to recall much of her symptoms or events surrounding her injuries  Social Hx Pt reports she has been homeless a year and a half ago.  Prior to this she was married and had a child who later passed away.  Does not want to go further into why she became homeless.  She previously was an [employer] fundraiser about 10 years ago. Currently she moves from a hotel, homeless shelters and various peoples homes.  States that she has no family or friends.  Endorsed tobacco use since she was 13 initially 3 packs/day down to half a pack per day.  Denies alcohol or illicit drug use  Past Medical History:  Diagnosis Date   Anxiety    Arthritis    back, joints   Asthma    Chronic pain    Constipation    Depression    Dysrhythmia    Palpitations   GERD (gastroesophageal reflux disease)    High cholesterol    Hypertension    resolved whn she stopped drinking alcohol   Mitral prolapse    dx as a child   Osteopenia    Osteoporosis     Past Surgical History:  Procedure Laterality Date   ABDOMINAL HYSTERECTOMY  1985   BACK SURGERY     BREAST SURGERY  augmentation   HARDWARE REMOVAL  02/27/2012   Procedure: HARDWARE REMOVAL;  Surgeon: Kerrin Champagne, MD;  Location: [facility] OR;  Service: Orthopedics;;  exploration and removal of portion of screw at T-!   JOINT REPLACEMENT Right    shoulder   left hip replacement  02-27-12   just screw - no replacement   NECK SURGERY     plates and screws   ORIF WRIST FRACTURE  1986   right   right femur replacement     ROTATOR CUFF REPAIR     right   SHOULDER HEMI-ARTHROPLASTY  02/27/2012   Procedure: SHOULDER HEMI-ARTHROPLASTY;  Surgeon: Kerrin Champagne, MD;  Location: [facility] OR;  Service: Orthopedics;  Laterality:  Right;  Right shoulder hemiarthroplasty, repair right rotator cuff   TONSILLECTOMY     TOTAL KNEE ARTHROPLASTY Left 03/27/2020   TOTAL KNEE ARTHROPLASTY Left 03/27/2020   Procedure: LEFT TOTAL KNEE ARTHROPLASTY;  Surgeon: Cammy Copa, MD;  Location: [facility] OR;  Service: Orthopedics;  Laterality: Left;   WRIST SURGERY     tendon repair from a dog bite- left     Allergies  Allergen Reactions   Penicillins Rash and Other (See Comments)    And, from childhood: Had at 70 yrs old and fainted ("with the sight of the large needle.") Has tolerated oral cephalexin and intraoperative cefazolin without issues.      Vancomycin Rash and Other (See Comments)    "I turned red"        Prior to Admission medications   Medication Sig Start Date End Date Taking? Authorizing Provider  amphetamine-dextroamphetamine (ADDERALL) 20 MG tablet Take 20 mg by mouth 2 (two) times daily.    Yes [provider]  buPROPion (WELLBUTRIN XL) 150 MG 24 hr tablet Take 150 mg by mouth at bedtime.   Yes [provider]  celecoxib (CELEBREX) 200 MG capsule Take 200 mg by mouth daily. 05/14/21  Yes [provider]  ibuprofen (ADVIL) 200 MG tablet Take 800 mg by mouth every 6 (six) hours as needed for mild pain or headache.   Yes [provider]  LORazepam (ATIVAN) 1 MG tablet Take 1 tablet (1 mg total) by mouth every 12 (twelve) hours as needed for anxiety. Do not take with Narcotic medications. Must choose between one or the other Patient taking differently: Take 1 mg by mouth 2 (two) times daily as needed for anxiety ("Do not take with narcotic medications. Must choose between one or the other."). 04/03/20  Yes Magnant, Charles L, PA-C  methocarbamol (ROBAXIN) 750 MG tablet Take 1 tablet (750 mg total) by mouth 3 (three) times daily as needed (muscle spasm/pain). 05/25/21  Yes Cathren Laine, MD  mirtazapine (REMERON) 15 MG tablet Take 15 mg by mouth at bedtime. 05/14/21  Yes [provider]  naproxen sodium (ALEVE) 220 MG tablet Take 220-440 mg by mouth 2 (two) times daily as needed (for mild pain or headaches).   Yes [provider]  traZODone (DESYREL) 100 MG tablet Take 300 mg by mouth at bedtime.    Yes [provider]  venlafaxine XR (EFFEXOR-XR) 150 MG 24 hr capsule Take 150 mg by mouth daily with breakfast.   Yes [provider]  albuterol (PROVENTIL HFA;VENTOLIN HFA) 108 (90 BASE) MCG/ACT inhaler Inhale 2 puffs into the lungs every 4 (four) hours as needed for wheezing or shortness of breath. Patient not taking: Reported on 07/08/2021    [provider]  aspirin 81 MG chewable tablet  Chew 1 tablet (81 mg total) by mouth daily. Patient not taking: No sig reported 04/03/20   Magnant, Charles L, PA-C  cyproheptadine (PERIACTIN) 4 MG tablet Take 4 mg by mouth 3 (three) times daily. Patient not taking: Reported on 07/08/2021    [provider]  HYDROcodone-acetaminophen (NORCO/VICODIN) 5-325 MG tablet Take 2 tablets by mouth every 4 (four) hours as needed. 06/29/21   Lorre Nick, MD  lidocaine (LIDODERM) 5 % Place 1 patch onto the skin daily. Remove & Discard patch within 12 hours or as directed by MD Patient not taking: No sig reported 04/20/21   Sabas Sous, MD  meclizine (ANTIVERT) 12.5 MG tablet Take 1 tablet (12.5 mg total) by mouth 3 (three) times daily as needed for dizziness. 06/29/21   Lorre Nick, MD  MYRBETRIQ 25 MG TB24 tablet Take 25 mg by mouth daily. Patient not taking: No sig reported 04/08/21   [provider]  ondansetron (ZOFRAN ODT) 4 MG disintegrating tablet Take 1 tablet (4 mg total) by mouth every 8 (eight) hours as needed for nausea or vomiting. 07/04/21   Tegeler, Canary Brim, MD  traMADol (ULTRAM) 50 MG tablet Take 1 tablet (50 mg total) by mouth every 8 (eight) hours as needed. Patient not taking: No sig reported 05/25/21   Cathren Laine, MD    Physical Exam: Vitals:    07/08/21 2130 07/08/21 2200 07/09/21 0029 07/09/21 0030  BP: 130/68 (!) 125/54 (!) 124/99 (!) 124/99  Pulse: 66 71 (!) 109 61  Resp: 17  18 20   Temp:   98.6 F (37 C)   TempSrc:   Oral   SpO2: 100% 98% 96% 97%  Weight:      Height:        Constitutional: NAD, calm, comfortable, elderly thin female laying in bed restless and moving extremities constantly Vitals:   07/08/21 2130 07/08/21 2200 07/09/21 0029 07/09/21 0030  BP: 130/68 (!) 125/54 (!) 124/99 (!) 124/99  Pulse: 66 71 (!) 109 61  Resp: 17  18 20   Temp:   98.6 F (37 C)   TempSrc:   Oral   SpO2: 100% 98% 96% 97%  Weight:      Height:       Eyes: PERRL, lids and conjunctivae normal ENMT: Mucous membranes are moist.  Neck: normal, supple Respiratory: Decreased right-sided breath sounds, no wheezing, no crackles. Normal respiratory effort on 2L via nasal cannula Cardiovascular: Regular rate and rhythm, no murmurs / rubs / gallops. No extremity edema.  Abdomen: no tenderness, no masses palpated.  Bowel sounds positive.  Musculoskeletal: no clubbing / cyanosis. No joint deformity upper and lower extremities. Good ROM, no contractures. Normal muscle tone.  Skin: Diffuse faint ecchymosis on anterior forehead, ecchymosis of forearm of bilateral upper extremity, numerous ulcerated excoriations self-inflicted by patient throughout lower extremity Neurologic: CN 2-12 grossly intact. Sensation intact, Strength 5/5 in all 4.  Psychiatric: Alert and oriented x4 but unable to provide current history.  Thought she was a 70 years old.  Normal mood.     Labs on Admission: I have personally reviewed following labs and imaging studies  CBC: Recent Labs  Lab 07/08/21 1306  WBC 11.3*  HGB 10.3*  HCT 32.9*  MCV 95.4  PLT 565*   Basic Metabolic Panel: Recent Labs  Lab 07/08/21 1306  NA 136  K 3.9  CL 104  CO2 20*  GLUCOSE 127*  BUN 24*  CREATININE 0.91  CALCIUM 9.0   GFR: Estimated Creatinine Clearance:  49.6 mL/min (by  C-G formula based on SCr of 0.91 mg/dL). Liver Function Tests: Recent Labs  Lab 07/08/21 1653  AST 17  ALT 9  ALKPHOS 73  BILITOT 0.2*  PROT 5.6*  ALBUMIN 3.0*   Recent Labs  Lab 07/08/21 1653  LIPASE 25   Recent Labs  Lab 07/08/21 1654  AMMONIA 22   Coagulation Profile: Recent Labs  Lab 07/08/21 1653  INR 1.1   Cardiac Enzymes: No results for input(s): CKTOTAL, CKMB, CKMBINDEX, TROPONINI in the last 168 hours. BNP (last 3 results) No results for input(s): PROBNP in the last 8760 hours. HbA1C: No results for input(s): HGBA1C in the last 72 hours. CBG: No results for input(s): GLUCAP in the last 168 hours. Lipid Profile: No results for input(s): CHOL, HDL, LDLCALC, TRIG, CHOLHDL, LDLDIRECT in the last 72 hours. Thyroid Function Tests: Recent Labs    07/08/21 1952  TSH 1.621   Anemia Panel: No results for input(s): VITAMINB12, FOLATE, FERRITIN, TIBC, IRON, RETICCTPCT in the last 72 hours. Urine analysis:    Component Value Date/Time   COLORURINE YELLOW 05/24/2021 1158   APPEARANCEUR CLEAR 05/24/2021 1158   LABSPEC 1.020 05/24/2021 1158   PHURINE 6.0 05/24/2021 1158   GLUCOSEU NEGATIVE 05/24/2021 1158   HGBUR MODERATE (A) 05/24/2021 1158   BILIRUBINUR NEGATIVE 05/24/2021 1158   KETONESUR NEGATIVE 05/24/2021 1158   PROTEINUR NEGATIVE 05/24/2021 1158   UROBILINOGEN 0.2 02/17/2011 0848   NITRITE NEGATIVE 05/24/2021 1158   LEUKOCYTESUR NEGATIVE 05/24/2021 1158    Radiological Exams on Admission: DG Chest 2 View  Result Date: 07/08/2021 CLINICAL DATA:  Bilateral rib pain. EXAM: CHEST - 2 VIEW COMPARISON:  Chest x-ray dated June 29, 2021. FINDINGS: Normal heart size. New moderate right pleural effusion right middle and lower lobe atelectasis. No pneumothorax. No acute osseous abnormality. Old right-sided rib fractures. IMPRESSION: 1. New moderate right pleural effusion with right middle and lower lobe atelectasis. Electronically Signed   By: Obie Dredge M.D.   On: 07/08/2021 14:42   CT Angio Chest PE W and/or Wo Contrast  Result Date: 07/08/2021 CLINICAL DATA:  Pt endorses bilateral rib pain since Friday. Denies SOB, but has pain with deep breathing. Denies any falls. EXAM: CT ANGIOGRAPHY CHEST WITH CONTRAST TECHNIQUE: Multidetector CT imaging of the chest was performed using the standard protocol during bolus administration of intravenous contrast. Multiplanar CT image reconstructions and MIPs were obtained to evaluate the vascular anatomy. CONTRAST:  74mL OMNIPAQUE IOHEXOL 350 MG/ML SOLN COMPARISON:  Chest x-ray 06/29/2021, CT chest 06/14/2020 FINDINGS: Cardiovascular: Satisfactory opacification of the pulmonary arteries to the segmental level. No evidence of pulmonary embolism. Normal heart size. No significant pericardial effusion. The thoracic aorta is normal in caliber. Mild atherosclerotic plaque of the thoracic aorta. At least 2 vessel coronary artery calcifications. Mediastinum/Nodes: No enlarged mediastinal, hilar, or axillary lymph nodes. Thyroid gland, trachea, and esophagus demonstrate no significant findings. Small to moderate volume hiatal hernia. Lungs/Pleura: Partial collapse of the right lower lobe. No focal consolidation. No pulmonary nodule. No pulmonary mass. Moderate volume right pleural fluid with hyperdensity along the rib fractures consistent with a hemothorax. No left pleural effusion. No pneumothorax. Upper Abdomen: No acute abnormality. Musculoskeletal: Intra and extracapsular rupture of a bilateral breast implants. Associated circumferential calcifications. Chronic, but worsened from 06/14/2020, compression fracture of the T10 vertebral body with approximately 50% vertebral body height loss. Multiple other thoracic vertebral bodies demonstrate decreased vertebral body height loss. Old healed left rib fractures.  Old healed right rib fractures.  Interval development of an acute right posterior eighth and ninth rib fractures  (compared to CT chest 06/14/2020, retrospectively these may have been present on x-ray ribs 06/29/2021). Acute minimally displaced mid sternal body fracture. Right shoulder arthroplasty partially visualized. Review of the MIP images confirms the above findings. IMPRESSION: 1. No pulmonary embolus. 2. Acute posterior right 8 and 9 rib fractures with associated moderate volume hemothorax. Retrospectively fractures may have been present on x-ray ribs 06/29/2021. 3. Acute minimally displaced mid sternal body fracture. 4. Small to moderate volume hiatal hernia. 5. Chronic, but worsened from 06/14/2020, compression fracture of the T10 vertebral body with approximately 50% vertebral body height loss. Recommend correlation with point tenderness to palpation for an acute component. 6. 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 Correlate with mammography. 7.  Aortic Atherosclerosis (ICD10-I70.0). Electronically Signed   By: Tish Frederickson M.D.   On: 07/08/2021 23:00   US Abdomen Limited RUQ (LIVER/GB)  Result Date: 07/08/2021 CLINICAL DATA:  Right upper quadrant abdominal pain, nausea vomiting. EXAM: ULTRASOUND ABDOMEN LIMITED RIGHT UPPER QUADRANT COMPARISON:  None. FINDINGS: Gallbladder: No gallstones or wall thickening visualized. No sonographic Murphy sign noted by sonographer. Small focus of adenomyomatosis. Common bile duct: Diameter: 5 mm Liver: No focal lesion identified. Within normal limits in parenchymal echogenicity. Portal vein is patent on color Doppler imaging with normal direction of blood flow towards the liver. Other: Small right pleural effusion. IMPRESSION: Small right pleural effusion, otherwise unremarkable right upper quadrant ultrasound. Electronically Signed   By: Elgie Collard M.D.   On: 07/08/2021 19:57      Assessment/Plan  Hemothorax in the setting of bright 8 to 9 rib fracture/displaced mid sternal body fracture -Both trauma surgery and CT surgery has  been consulted by ED physician.  Initially trauma surgery did not think hemothorax was amenable to chest tube given that injury likely occurred over a week ago and recommended CT surgery consult.  However CT surgery felt that she can have pigtail catheter placed in the ED.  However patient declined any intervention at this time and wish to follow-up with CT surgery in the morning for definitive management. -Discussed with EDP Dr. Rush Landmark that hospitalist will consult overnight for pigtail catheter placement should she have any acute decompensation  Acute blood loss anemia - Hemoglobin of 10.3 on admit.  Unfortunately patient refused laboratory work-up when she presented to the ED several days ago so there is no recent prior for comparison.  Last month hemoglobin of 13.5 -will trend H/H q2hr and will type and screen  -Transfusion threshold hemoglobin <7  Acute hypoxic secondary to large hemothorax -Admitted on 2L  -continue tx plan as above -Keep on continuous telemetry monitor closely overnight in Stepdown unit   Chronic T10 compression fracture -Patient asymptomatic at this time  Chronic intra-extracapsular breast implant rupture -Patient asymptomatic at this time  Cognitive impairment -Pt alert and oriented to self, time and place but unable to give history about current event.  She also got her age wrong.  -Also check UDS  Anxiety/depression - Continue antidepressives.PRN ativan - I have verified current Rx in a substance database.  However I do not see that she has been given a new prescription for tramadol since September so we will hold off on this for  DVT prophylaxis:SCD Code Status: Full Family Communication: Plan discussed with patient at bedside  disposition Plan: Home with at least 2 midnight stays  Consults called: CT surgery, trauma surgery Admission status: inpatient  Level of care: Progressive  Status is: Inpatient  Remains inpatient appropriate because: large  hemothorax with acute hypoxic requiring supplemental oxygen and concerns of potential of hemodynamic instability overnight         Bralen Wiltgen T Krupa Stege DO [facility]   If 7PM-7AM, please contact night-coverage [email or web address]   07/09/2021, 12:37 AM

## 2021-07-09 NOTE — Consult Note (Signed)
Reason for Consult: Trauma Referring Physician: Trauma surgery  [name] is an 70 y.o. female.  HPI: 70 year old female little over 1 week status post unwitnessed fall.  Patient amnestic to the event and is a poor historian.  Patient presented now with increasing chest wall pain.  Work-up demonstrates evidence of hemothorax.  Also incidentally noted T10 compression fracture.  Patient has chest wall pain and some degree of back pain.  She also has chronic pain which appears stable.  She denies any radiating pain numbness or weakness into her lower extremities.  Past Medical History:  Diagnosis Date   Anxiety    Arthritis    back, joints   Asthma    Chronic pain    Constipation    Depression    Dysrhythmia    Palpitations   GERD (gastroesophageal reflux disease)    High cholesterol    Hypertension    resolved whn she stopped drinking alcohol   Mitral prolapse    dx as a child   Osteopenia    Osteoporosis     Past Surgical History:  Procedure Laterality Date   ABDOMINAL HYSTERECTOMY  1985   BACK SURGERY     BREAST SURGERY     augmentation   HARDWARE REMOVAL  02/27/2012   Procedure: HARDWARE REMOVAL;  Surgeon: Kerrin Champagne, MD;  Location: [facility] OR;  Service: Orthopedics;;  exploration and removal of portion of screw at T-!   JOINT REPLACEMENT Right    shoulder   left hip replacement  02-27-12   just screw - no replacement   NECK SURGERY     plates and screws   ORIF WRIST FRACTURE  1986   right   right femur replacement     ROTATOR CUFF REPAIR     right   SHOULDER HEMI-ARTHROPLASTY  02/27/2012   Procedure: SHOULDER HEMI-ARTHROPLASTY;  Surgeon: Kerrin Champagne, MD;  Location: [facility] OR;  Service: Orthopedics;  Laterality: Right;  Right shoulder hemiarthroplasty, repair right rotator cuff   TONSILLECTOMY     TOTAL KNEE ARTHROPLASTY Left 03/27/2020   TOTAL KNEE ARTHROPLASTY Left 03/27/2020   Procedure: LEFT TOTAL KNEE ARTHROPLASTY;  Surgeon: Cammy Copa, MD;  Location: [facility]  OR;  Service: Orthopedics;  Laterality: Left;   WRIST SURGERY     tendon repair from a dog bite- left    No family history on file.  Social History:  reports that she has been smoking cigarettes. She has a 25.00 pack-year smoking history. She has never used smokeless tobacco. She reports current alcohol use. She reports that she does not use drugs.  Allergies:  Allergies  Allergen Reactions   Penicillins Rash and Other (See Comments)    And, from childhood: Had at 71 yrs old and fainted ("with the sight of the large needle.") Has tolerated oral cephalexin and intraoperative cefazolin without issues.      Vancomycin Rash and Other (See Comments)    "I turned red"     Medications: I have reviewed the patient's current medications.  Results for orders placed or performed during the hospital encounter of 07/08/21 (from the past 48 hour(s))  Basic metabolic panel     Status: Abnormal   Collection Time: 07/08/21  1:06 PM  Result Value Ref Range   Sodium 136 135 - 145 mmol/L   Potassium 3.9 3.5 - 5.1 mmol/L   Chloride 104 98 - 111 mmol/L   CO2 20 (L) 22 - 32 mmol/L   Glucose, Bld 127 (H) 70 -  99 mg/dL    Comment: Glucose reference range applies only to samples taken after fasting for at least 8 hours.   BUN 24 (H) 8 - 23 mg/dL   Creatinine, Ser 1.24 0.44 - 1.00 mg/dL   Calcium 9.0 8.9 - 58.0 mg/dL   GFR, Estimated >99 >83 mL/min    Comment: (NOTE) Calculated using the CKD-EPI Creatinine Equation (2021)    Anion gap 12 5 - 15    Comment: Performed at [facility], [address] [place], [place] [address]  CBC     Status: Abnormal   Collection Time: 07/08/21  1:06 PM  Result Value Ref Range   WBC 11.3 (H) 4.0 - 10.5 K/uL   RBC 3.45 (L) 3.87 - 5.11 MIL/uL   Hemoglobin 10.3 (L) 12.0 - 15.0 g/dL   HCT 53.9 (L) 76.7 - 34.1 %   MCV 95.4 80.0 - 100.0 fL   MCH 29.9 26.0 - 34.0 pg   MCHC 31.3 30.0 - 36.0 g/dL   RDW 93.7 90.2 - 40.9 %   Platelets 565 (H) 150 - 400 K/uL    nRBC 0.0 0.0 - 0.2 %    Comment: Performed at [facility], [address] [place], [place] [address]  Troponin I (High Sensitivity)     Status: None   Collection Time: 07/08/21  1:06 PM  Result Value Ref Range   Troponin I (High Sensitivity) 6 <18 ng/L    Comment: (NOTE) Elevated high sensitivity troponin I (hsTnI) values and significant  changes across serial measurements may suggest ACS but many other  chronic and acute conditions are known to elevate hsTnI results.  Refer to the "Links" section for chest pain algorithms and additional  guidance. Performed at [facility], [address], [place], [place] [address]   Hepatic function panel     Status: Abnormal   Collection Time: 07/08/21  4:53 PM  Result Value Ref Range   Total Protein 5.6 (L) 6.5 - 8.1 g/dL   Albumin 3.0 (L) 3.5 - 5.0 g/dL   AST 17 15 - 41 U/L   ALT 9 0 - 44 U/L   Alkaline Phosphatase 73 38 - 126 U/L   Total Bilirubin 0.2 (L) 0.3 - 1.2 mg/dL   Bilirubin, Direct 0.2 0.0 - 0.2 mg/dL   Indirect Bilirubin 0.0 (L) 0.3 - 0.9 mg/dL    Comment: Performed at [facility], [address], [place], [place] [address]  Lipase, blood     Status: None   Collection Time: 07/08/21  4:53 PM  Result Value Ref Range   Lipase 25 11 - 51 U/L    Comment: Performed at [facility], [address] [place], [place] [address]  D-dimer, quantitative     Status: Abnormal   Collection Time: 07/08/21  4:53 PM  Result Value Ref Range   D-Dimer, Quant 1.56 (H) 0.00 - 0.50 ug/mL-FEU    Comment: (NOTE) At the manufacturer cut-off value of 0.5 g/mL FEU, this assay has a negative predictive value of 95-100%.This assay is intended for use in conjunction with a clinical pretest probability (PTP) assessment model to exclude pulmonary embolism (PE) and deep venous thrombosis (DVT) in outpatients suspected of PE or DVT. Results should be correlated with clinical presentation. Performed at [facility], [address] [place], [place] [address]   Protime-INR     Status: None   Collection Time: 07/08/21  4:53 PM  Result  Value Ref Range   Prothrombin Time 14.1 11.4 - 15.2 seconds   INR 1.1 0.8 - 1.2    Comment: (NOTE) INR goal varies based on device and disease states. Performed at [facility], [address] [place], [place] [address]   Ammonia     Status: None   Collection Time: 07/08/21  4:54 PM  Result Value Ref Range   Ammonia 22 9 - 35 umol/L    Comment: Performed at [facility], [address] [place], [place] [address]  TSH     Status: None   Collection Time: 07/08/21  7:52 PM  Result Value Ref Range   TSH 1.621 0.350 - 4.500 uIU/mL    Comment: Performed by a 3rd Generation assay with a functional sensitivity of <=0.01 uIU/mL. Performed at [facility], [address], [place], [place] [address]   Urinalysis, Routine w reflex microscopic Urine, Clean Catch     Status: Abnormal   Collection Time: 07/09/21 12:36 AM  Result Value Ref Range   Color, Urine YELLOW YELLOW   APPearance CLEAR CLEAR   Specific Gravity, Urine >1.046 (H) 1.005 - 1.030   pH 5.0 5.0 - 8.0   Glucose, UA NEGATIVE NEGATIVE mg/dL   Hgb urine dipstick SMALL (A) NEGATIVE   Bilirubin Urine NEGATIVE NEGATIVE   Ketones, ur NEGATIVE NEGATIVE mg/dL   Protein, ur NEGATIVE NEGATIVE mg/dL   Nitrite NEGATIVE NEGATIVE   Leukocytes,Ua TRACE (A) NEGATIVE   RBC / HPF 21-50 0 - 5 RBC/hpf   WBC, UA 6-10 0 - 5 WBC/hpf   Bacteria, UA NONE SEEN NONE SEEN   Squamous Epithelial / LPF 0-5 0 - 5    Comment: Performed at [facility], [address], [place], [place] [address]  Urine rapid drug screen (hosp performed)     Status: None   Collection Time: 07/09/21 12:36 AM  Result Value Ref Range   Opiates NONE DETECTED NONE DETECTED   Cocaine NONE DETECTED NONE DETECTED   Benzodiazepines NONE DETECTED NONE DETECTED   Amphetamines NONE DETECTED NONE DETECTED   Tetrahydrocannabinol NONE DETECTED NONE  DETECTED   Barbiturates NONE DETECTED NONE DETECTED    Comment: (NOTE) DRUG SCREEN FOR MEDICAL PURPOSES ONLY.  IF CONFIRMATION IS NEEDED FOR ANY PURPOSE, NOTIFY LAB WITHIN 5 DAYS.  LOWEST DETECTABLE LIMITS FOR URINE DRUG SCREEN Drug Class                     Cutoff (ng/mL) Amphetamine and metabolites    1000 Barbiturate and metabolites    200 Benzodiazepine                 200 Tricyclics and metabolites     300 Opiates and metabolites        300 Cocaine and metabolites        300 THC                            50 Performed at [facility], [address] [place], [place] [address]   Hemoglobin and hematocrit, blood     Status: Abnormal   Collection Time: 07/09/21  1:28 AM  Result Value Ref Range   Hemoglobin 8.9 (L) 12.0 - 15.0 g/dL   HCT 67.8 (L) 93.8 - 10.1 %    Comment: Performed at [facility], [address] [place], [place] [address]  Type and screen  [facility]     Status: None   Collection Time: 07/09/21  1:28 AM  Result Value Ref Range   ABO/RH(D) O POS    Antibody Screen NEG    Sample Expiration      07/12/2021,2359 Performed at [facility], [address] [place], [place] [address]   Resp Panel by RT-PCR (Flu A&B, Covid) Nasopharyngeal Swab     Status: None   Collection Time: 07/09/21  1:29 AM   Specimen: Nasopharyngeal Swab; Nasopharyngeal(NP) swabs in vial transport medium  Result Value Ref Range   SARS Coronavirus 2 by RT PCR NEGATIVE NEGATIVE    Comment: (NOTE) SARS-CoV-2 target nucleic acids are NOT DETECTED.  The SARS-CoV-2 RNA is generally detectable in upper respiratory specimens during the acute phase of infection. The lowest concentration of SARS-CoV-2 viral copies this assay can detect is 138 copies/mL. A negative result does not preclude SARS-Cov-2 infection and should not be used as the sole basis for treatment or other patient management decisions. A negative result may occur with  improper specimen  collection/handling, submission of specimen other than nasopharyngeal swab, presence of viral mutation(s) within the areas targeted by this assay, and inadequate number of viral copies(<138 copies/mL). A negative result must be combined with clinical observations, patient history, and epidemiological information. The expected result is Negative.  Fact Sheet for Patients:  BloggerCourse.com  Fact Sheet for Healthcare Providers:  SeriousBroker.it  This test is no t yet approved or cleared by the [place] FDA and  has been authorized for detection and/or diagnosis of SARS-CoV-2 by FDA under an Emergency Use Authorization (EUA). This EUA will remain  in effect (meaning this test can be used) for the duration of the COVID-19 declaration under Section 564(b)(1) of the Act, 21 U.S.C.section 360bbb-3(b)(1), unless the authorization is terminated  or revoked sooner.       Influenza A by PCR NEGATIVE NEGATIVE   Influenza B by PCR NEGATIVE NEGATIVE    Comment: (NOTE) The Xpert Xpress SARS-CoV-2/FLU/RSV plus assay is intended as an aid in the diagnosis of influenza from Nasopharyngeal swab specimens and should not be used as a sole basis for treatment. Nasal washings and aspirates are unacceptable for Xpert Xpress SARS-CoV-2/FLU/RSV testing.  Fact Sheet for Patients: BloggerCourse.com  Fact Sheet for Healthcare Providers: SeriousBroker.it  This test is not yet approved or cleared by the [place] FDA and has been authorized for detection and/or diagnosis of SARS-CoV-2 by FDA under an Emergency Use Authorization (EUA). This EUA will remain in effect (meaning this test can be used) for the duration of the COVID-19 declaration under Section 564(b)(1) of the Act, 21 U.S.C. section 360bbb-3(b)(1), unless the authorization is terminated or revoked.  Performed at [facility], [address], [place], [place] [address]   Hemoglobin and hematocrit, blood     Status: Abnormal   Collection Time: 07/09/21  2:50 AM  Result Value Ref Range   Hemoglobin 8.6 (L) 12.0 - 15.0 g/dL   HCT 00.8 (L) 67.6 - 19.5 %    Comment: Performed at [facility], [address], [place], [place] [address]  Troponin I (High Sensitivity)     Status: None   Collection Time: 07/09/21  4:01 AM  Result Value Ref Range   Troponin I (High Sensitivity) 8 <18 ng/L    Comment: (NOTE) Elevated high sensitivity troponin I (hsTnI) values and significant  changes across serial measurements may suggest ACS but many other  chronic and acute conditions are known to elevate hsTnI results.  Refer to the "Links" section for chest pain algorithms and additional  guidance. Performed at [facility], [address], [place], [place] [address]   Hemoglobin and hematocrit, blood     Status: Abnormal   Collection Time: 07/09/21  4:01 AM  Result Value Ref Range   Hemoglobin 8.6 (L) 12.0 - 15.0 g/dL   HCT 60.4 (L) 54.0 - 98.1 %    Comment: Performed at [facility], [address] [place], [place] [address]  Hemoglobin and hematocrit, blood     Status: Abnormal   Collection Time: 07/09/21  6:33 AM  Result Value Ref Range   Hemoglobin 8.5 (L) 12.0 - 15.0 g/dL   HCT 82.9 (L) 56.2 - 13.0 %    Comment: Performed at [facility], [address], [place], [place] [address]    CT ABDOMEN PELVIS WO CONTRAST  Result Date: 07/09/2021 CLINICAL DATA:  Fall. EXAM: CT ABDOMEN AND PELVIS WITHOUT CONTRAST TECHNIQUE: Multidetector CT imaging of the abdomen and pelvis was performed following the standard protocol without IV contrast. COMPARISON:  May 24, 2021.  July 08, 2021. FINDINGS: Lower chest: Minimal anterior pneumothorax is noted in the right lung base. Right-sided chest tube is noted. Right pleural effusion is noted with associated right lower lobe atelectasis. Moderate size  sliding-type hiatal hernia is noted. Hepatobiliary: Small gallstone is noted. No biliary dilatation is noted. The liver is unremarkable on these unenhanced images. Pancreas: Unremarkable. No pancreatic ductal dilatation or surrounding inflammatory changes. Spleen: Normal in size without focal abnormality. Adrenals/Urinary Tract: Adrenal glands are unremarkable. Kidneys are normal, without renal calculi, focal lesion, or hydronephrosis. Bladder is unremarkable. Stomach/Bowel: The stomach appears normal. There is no evidence of bowel obstruction or inflammation. Vascular/Lymphatic: Aortic atherosclerosis. No enlarged abdominal or pelvic lymph nodes. Reproductive: Status post hysterectomy. No adnexal masses. Other: No abdominal wall hernia or abnormality. No abdominopelvic ascites. Musculoskeletal: Probably acute moderate compression deformity of T9 vertebral body is noted. IMPRESSION: Minimal anterior pneumothorax is noted in the visualized portion of right lung base. Right-sided chest tube is again noted. Right pleural effusion is noted with associated right lower lobe atelectasis. Moderate size sliding-type hiatal hernia. Cholelithiasis without evidence of cholecystitis. Probably acute moderate compression fracture of T9 vertebral body. Aortic Atherosclerosis (ICD10-I70.0). Electronically Signed   By: Lupita Raider M.D.   On: 07/09/2021 14:22   DG Chest 2 View  Result Date: 07/08/2021 CLINICAL DATA:  Bilateral rib pain. EXAM: CHEST - 2 VIEW COMPARISON:  Chest x-ray dated June 29, 2021. FINDINGS: Normal heart size. New moderate right pleural effusion right middle and lower lobe atelectasis. No pneumothorax. No acute osseous abnormality. Old right-sided rib fractures. IMPRESSION: 1. New moderate right pleural effusion with right middle and lower lobe atelectasis. Electronically Signed   By: Obie Dredge M.D.   On: 07/08/2021 14:42   CT HEAD WO CONTRAST ( )  Result Date: 07/09/2021 CLINICAL DATA:   Fall EXAM: CT HEAD WITHOUT CONTRAST TECHNIQUE: Contiguous axial images were obtained from the base of the skull through the vertex without intravenous contrast. COMPARISON:  CT head 10/17/2008 FINDINGS: Brain: There is no acute intracranial hemorrhage, extra-axial fluid collection, or acute infarct. There is mild parenchymal volume loss with commensurate enlargement of the ventricular system. Hypodensity in the subcortical and periventricular white matter likely reflects sequela of moderate chronic white matter microangiopathy. There is no mass lesion.  There is no midline shift. Vascular:  There is calcification of the bilateral cavernous ICAs. Skull: Normal. Negative for fracture or focal lesion. Sinuses/Orbits: The imaged paranasal sinuses are clear. The globes and orbits are unremarkable. Other: None. IMPRESSION: 1. No acute intracranial pathology. 2. Moderate global parenchymal volume loss and chronic white matter microangiopathy. Electronically Signed   By: Lesia Hausen M.D.   On: 07/09/2021 14:45   CT Angio Chest PE W and/or Wo Contrast  Result Date: 07/08/2021 CLINICAL DATA:  Pt endorses bilateral rib pain since Friday. Denies SOB, but has pain with deep breathing. Denies any falls. EXAM: CT ANGIOGRAPHY CHEST WITH CONTRAST TECHNIQUE: Multidetector CT imaging of the chest was performed using the standard protocol during bolus administration of intravenous contrast. Multiplanar CT image reconstructions and MIPs were obtained to evaluate the vascular anatomy. CONTRAST:  55mL OMNIPAQUE IOHEXOL 350 MG/ML SOLN COMPARISON:  Chest x-ray 06/29/2021, CT chest 06/14/2020 FINDINGS: Cardiovascular: Satisfactory opacification of the pulmonary arteries to the segmental level. No evidence of pulmonary embolism. Normal heart size. No significant pericardial effusion. The thoracic aorta is normal in caliber. Mild atherosclerotic plaque of the thoracic aorta. At least 2 vessel coronary artery calcifications.  Mediastinum/Nodes: No enlarged mediastinal, hilar, or axillary lymph nodes. Thyroid gland, trachea, and esophagus demonstrate no significant findings. Small to moderate volume hiatal hernia. Lungs/Pleura: Partial collapse of the right lower lobe. No focal consolidation. No pulmonary nodule. No pulmonary mass. Moderate volume right pleural fluid with hyperdensity along the rib fractures consistent with a hemothorax. No left pleural effusion. No pneumothorax. Upper Abdomen: No acute abnormality. Musculoskeletal: Intra and extracapsular rupture of a bilateral breast implants. Associated circumferential calcifications. Chronic, but worsened from 06/14/2020, compression fracture of the T10 vertebral body with approximately 50% vertebral body height loss. Multiple other thoracic vertebral bodies demonstrate decreased vertebral body height loss. Old healed left rib fractures.  Old healed right rib fractures. Interval development of an acute right posterior eighth and ninth rib fractures (compared to CT chest 06/14/2020, retrospectively these may have been present on x-ray ribs 06/29/2021). Acute minimally displaced mid sternal body fracture. Right shoulder arthroplasty partially visualized. Review of the MIP images confirms the above findings. IMPRESSION: 1. No pulmonary embolus. 2. Acute posterior right 8 and 9 rib fractures with associated moderate volume hemothorax. Retrospectively fractures may have been present on x-ray ribs 06/29/2021. 3. Acute minimally displaced mid sternal body fracture. 4. Small to moderate volume hiatal hernia. 5. Chronic, but worsened from 06/14/2020, compression fracture of the T10 vertebral body with approximately 50% vertebral body height loss. Recommend correlation with point tenderness to palpation for an acute component. 6. 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 Correlate with mammography. 7.  Aortic Atherosclerosis (ICD10-I70.0). Electronically Signed    By: Tish Frederickson M.D.   On: 07/08/2021 23:00   CT CERVICAL SPINE WO CONTRAST  Result Date: 07/09/2021 CLINICAL DATA:  Fall EXAM: CT CERVICAL SPINE WITHOUT CONTRAST TECHNIQUE: Multidetector CT imaging of the cervical spine was performed without intravenous contrast. Multiplanar CT image reconstructions were also generated. COMPARISON:  Cervical spine CT 12/01/2011, cervical spine radiographs 02/07/2020 FINDINGS: Alignment: There is straightening of the cervical spine curvature. There is trace anterolisthesis of C2 on C3, 3 mm anterolisthesis of C3 on C4 and C4 on C5, and 4 mm anterolisthesis of T1 on T2, all slightly increased since 2013. There is no jumped or perched facet or other evidence of traumatic malalignment. Skull base and vertebrae: Skull base alignment is maintained. There are postsurgical changes reflecting ACDF spanning from C5 through  T1 and separate anterior fusion at C4-C5. Posterior fusion hardware extends from C4 through T1. The patient is status post left-sided corpectomy at T7 there is partial osseous fusion across the C4-C5 level and solid-appearing fusion across the C5-C6, C6-C7, and C7-T1 levels. A portion of a previously fractured screw remains within the T1 vertebral body. There is increased irregularity of the superior T2 endplate with mild loss of vertebral body height anteriorly. There is no evidence of acute fracture. Soft tissues and spinal canal: There is no definite evidence of hematoma in the spinal canal, though evaluation is degraded by significant streak artifact. The soft tissues are otherwise unremarkable. Disc levels: Since 2013, there has been significant interval worsening of adjacent segment disease at T1-T2. The osseous spinal canal appears overall patent. There is multilevel facet arthropathy throughout the cervical spine resulting in moderate to severe bilateral neural foraminal stenosis at T1-T2. There is no definite high-grade neural foraminal stenosis at the  remaining levels. Upper chest: A chest tube is seen coiled in the right apex. There is a partially imaged right pleural effusion. Patchy opacity in the left apex was not seen on the CTA chest from 1 day prior. Other: None. IMPRESSION: 1. No acute fracture or traumatic malalignment of the cervical spine. 2. Postsurgical changes reflecting anterior and posterior instrumented fusion spanning from C4 through T1 without evidence of complication. 3. 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 4. Patchy opacities in the left apex were not definitely present on the prior CT chest and may reflect developing infection or aspiration. Electronically Signed   By: Lesia Hausen M.D.   On: 07/09/2021 14:38   DG Chest Port 1 View  Result Date: 07/09/2021 CLINICAL DATA:  Respiratory failure.  Chest tube placement. EXAM: PORTABLE CHEST 1 VIEW COMPARISON:  CT angiogram chest 07/08/2021. Chest radiographs 07/08/2021. FINDINGS: Heart size at the upper limits of normal, unchanged. Interval placement of a right-sided chest tube with tip projecting at the level of the right lung apex. No evidence of pneumothorax. Apparent elevation of the right hemidiaphragm likely reflecting a small to moderate residual right pleural effusion/hemothorax. Additional ill-defined opacity within the mid to lower right lung field, and within the left lung base, likely reflecting atelectasis. Known right rib fractures, sternal fractures and vertebral fractures better characterized on the prior chest CT of 07/08/2021. Prior right shoulder arthroplasty. Fusion hardware within the cervical and upper thoracic spine. IMPRESSION: Interval placement of a right-sided chest tube with tip projecting at the level of the right lung apex. No pneumothorax. Significant interval decrease in size of a right pleural effusion/hemothorax, with a small-to-moderate volume residual. Atelectasis at the right greater than left lung bases. Electronically  Signed   By: Jackey Loge D.O.   On: 07/09/2021 11:47   US Abdomen Limited RUQ (LIVER/GB)  Result Date: 07/08/2021 CLINICAL DATA:  Right upper quadrant abdominal pain, nausea vomiting. EXAM: ULTRASOUND ABDOMEN LIMITED RIGHT UPPER QUADRANT COMPARISON:  None. FINDINGS: Gallbladder: No gallstones or wall thickening visualized. No sonographic Murphy sign noted by sonographer. Small focus of adenomyomatosis. Common bile duct: Diameter: 5 mm Liver: No focal lesion identified. Within normal limits in parenchymal echogenicity. Portal vein is patent on color Doppler imaging with normal direction of blood flow towards the liver. Other: Small right pleural effusion. IMPRESSION: Small right pleural effusion, otherwise unremarkable right upper quadrant ultrasound. Electronically Signed   By: Elgie Collard M.D.   On: 07/08/2021 19:57    Pertinent items noted in HPI and remainder  of comprehensive ROS otherwise negative. Blood pressure (!) 119/54, pulse 71, temperature 98.6 F (37 C), temperature source Oral, resp. rate (!) 25, height 5\' 8"  (1.727 m), weight 56.2 kg, SpO2 91 %. Patient is awake and alert.  She is oriented and reasonably appropriate.  Her speech is fluent.  Judgment insight are intact.  Cranial nerve function normal bilateral.  Motor and sensory examination extremities normal.    Assessment/Plan: Patient is status post extensive anterior and posterior cervical surgery by an outside physician.  She has significant degenerative disease and worsening adjacent level degeneration at T1-2 which I do not think it is significantly worsened to the point of any real danger.  Canal is reasonably well-preserved.  Patient with probable compression fracture of T10.  Difficult to say whether her current symptoms are related to her rib fractures or some is related to the vertebral fracture.  At this point I think the fracture is not structurally significant.  She may be mobilized without a brace.  No need for  follow-up imaging.  Trenden Hazelrigg 07/09/2021, 5:02 PM

## 2021-07-09 NOTE — ED Notes (Signed)
Trauma admitting at bedside

## 2021-07-10 ENCOUNTER — Inpatient Hospital Stay (HOSPITAL_COMMUNITY): Payer: Medicare Other

## 2021-07-10 DIAGNOSIS — J942 Hemothorax: Secondary | ICD-10-CM | POA: Diagnosis not present

## 2021-07-10 LAB — BASIC METABOLIC PANEL
Anion gap: 8 (ref 5–15)
BUN: 19 mg/dL (ref 8–23)
CO2: 19 mmol/L — ABNORMAL LOW (ref 22–32)
Calcium: 8.3 mg/dL — ABNORMAL LOW (ref 8.9–10.3)
Chloride: 109 mmol/L (ref 98–111)
Creatinine, Ser: 0.62 mg/dL (ref 0.44–1.00)
GFR, Estimated: >60 mL/min (ref >60)
Glucose, Bld: 119 mg/dL — ABNORMAL HIGH (ref 70–99)
Potassium: 3.8 mmol/L (ref 3.5–5.1)
Sodium: 136 mmol/L (ref 135–145)

## 2021-07-10 LAB — CBC WITH DIFFERENTIAL/PLATELET
Abs Immature Granulocytes: 0.1 10*3/uL — ABNORMAL HIGH (ref 0.00–0.07)
Basophils Absolute: 0.1 10*3/uL (ref 0.0–0.1)
Basophils Relative: 1 %
Eosinophils Absolute: 0.5 10*3/uL (ref 0.0–0.5)
Eosinophils Relative: 4 %
HCT: 23.9 % — ABNORMAL LOW (ref 36.0–46.0)
Hemoglobin: 7.9 g/dL — ABNORMAL LOW (ref 12.0–15.0)
Immature Granulocytes: 1 %
Lymphocytes Relative: 14 %
Lymphs Abs: 1.6 10*3/uL (ref 0.7–4.0)
MCH: 30.3 pg (ref 26.0–34.0)
MCHC: 33.1 g/dL (ref 30.0–36.0)
MCV: 91.6 fL (ref 80.0–100.0)
Monocytes Absolute: 1.5 10*3/uL — ABNORMAL HIGH (ref 0.1–1.0)
Monocytes Relative: 13 %
Neutro Abs: 8 10*3/uL — ABNORMAL HIGH (ref 1.7–7.7)
Neutrophils Relative %: 67 %
Platelets: 405 10*3/uL — ABNORMAL HIGH (ref 150–400)
RBC: 2.61 MIL/uL — ABNORMAL LOW (ref 3.87–5.11)
RDW: 13.8 % (ref 11.5–15.5)
WBC: 11.7 10*3/uL — ABNORMAL HIGH (ref 4.0–10.5)
nRBC: 0 % (ref 0.0–0.2)

## 2021-07-10 MED ORDER — AMPHETAMINE-DEXTROAMPHETAMINE 10 MG PO TABS
20.0000 mg | ORAL_TABLET | Freq: Two times a day (BID) | ORAL | Status: DC
  Administered 2021-07-11 – 2021-07-20 (×18): 20 mg via ORAL
  Filled 2021-07-10 (×22): qty 2

## 2021-07-10 MED ORDER — TRAZODONE HCL 150 MG PO TABS
300.0000 mg | ORAL_TABLET | Freq: Every day | ORAL | Status: DC
  Administered 2021-07-10 – 2021-07-19 (×10): 300 mg via ORAL
  Filled 2021-07-10 (×10): qty 2

## 2021-07-10 NOTE — Progress Notes (Signed)
PROGRESS NOTE  [name]  DOB: [birth year]  PCP: Paulino Door, MD IHK:[id number]  DOA: 07/08/2021  LOS: 1 day  Hospital Day: 3  Chief Complaint  Patient presents with   Chest Pain    Brief narrative: [name] is a 70 y.o. female with PMH significant for HTN, HLD, chronic smoking, mitral valve prolapse, palpitation, anxiety/depression, asthma, chronic pain, osteopenia, osteoporosis who has been homeless for the last year and a half and has been moving between different shelters in peoples homes. Patient presented to the ED on 10/24 with complaint of right anterior rib pain on deep inspiration ongoing for 4 to 5 days.  In the ED, she was was afebrile, normotensive, initially maintaining oxygen saturation but while in the ED saturation dropped to 70s and she was placed on 2 L oxygen by nasal cannula.  On exam, she was noted to have numerous bruises on forehead and upper extremity but she could not recall what happened.  She also was noted to have difficulty with memory. Labs showed D-dimer elevated 1.56 CTA of the chest was showed right eighth and ninth breath fracture, displaced mid sternal body fracture with large right-sided hemothorax.  Hemoglobin of 10.3 which down from 13.5 in September.   Patient had presented to the emergency room on 10/15 with injuries, chest x-rays were without definite evidence of fracture or complications so she was discharged from ER. Chest tube was placed by surgery on 10/25.   Subjective: Patient seen and examined.  Has pain on the right side of the chest.  She is wondering about her incentive spirometer as she knows that she have to use it.  She is a retired Engineer, civil (consulting).  Denies any other complaints.  Not mobilized yet.  Assessment/Plan: Hemothorax in the setting of acute right 8 to 9 rib fracture and displaced mid sternal body fracture -Underwent chest tube placement by trauma surgery this morning.     Acute blood loss anemia -Baseline hemoglobin  normal, presented with hemoglobin low at 10.3 and overnight dropped to between 8 and 9, has been stable and there for last few readings.   -Continue to monitor, transfuse if less than 7.   Recent Labs    07/08/21 1306 07/09/21 0128 07/09/21 0250 07/09/21 0401 07/09/21 0633 07/10/21 0413  HGB 10.3* 8.9* 8.6* 8.6* 8.5* 7.9*  MCV 95.4  --   --   --   --  91.6   Acute hypoxia secondary to large hemothorax -Patient was maintaining saturation on presentation but dropped to 70s while in the ED and required 2 L oxygen.  Currently on room air.   Recent fall Chronic T10 compression fracture -Patient asymptomatic at this time -Per trauma surgery, compression fracture seems worse in recent scan.  Seen by neurosurgery.  No surgical intervention recommended.    Anxiety/depression - Continue antidepressives, Ativan as needed.  Patient on multiple medications at home including trazodone, Wellbutrin and Remeron that she will continue.   Mobility: Encourage ambulation.  PT eval Living condition: Homeless Goals of care:   Code Status: Full Code  Nutritional status: Body mass index is 18.85 kg/m.     Diet:  Diet Order             Diet regular Room service appropriate? Yes; Fluid consistency: Thin  Diet effective now                  DVT prophylaxis:  enoxaparin (LOVENOX) injection 30 mg Start: 07/09/21 2200 SCDs Start: 07/09/21 0035  Antimicrobials: None Fluid: None Consultants: Trauma surgery Family Communication: None at bedside  Status is: Inpatient  Remains inpatient appropriate because: Actively managed chest tube with hemothorax.  Dispo: The patient is from: Homeless              Anticipated d/c is to: Skilled nursing facility.              Patient currently is not medically stable to d/c.   Difficult to place patient No     Infusions:    Scheduled Meds:  amphetamine-dextroamphetamine  20 mg Oral BID WC   buPROPion  150 mg Oral QHS   enoxaparin (LOVENOX)  injection  30 mg Subcutaneous Q12H   mirtazapine  15 mg Oral QHS   traZODone  300 mg Oral QHS   venlafaxine XR  150 mg Oral Q breakfast    PRN meds: HYDROcodone-acetaminophen, LORazepam, methocarbamol, morphine injection   Antimicrobials: Anti-infectives (From admission, onward)    None       Objective: Vitals:   07/10/21 0800 07/10/21 1036  BP:  (!) 117/42  Pulse: 60 70  Resp: 17 20  Temp:  98.6 F (37 C)  SpO2: 96% 94%    Intake/Output Summary (Last 24 hours) at 07/10/2021 1259 Last data filed at 07/10/2021 0800 Gross per 24 hour  Intake 240 ml  Output 60 ml  Net 180 ml   Filed Weights   07/08/21 1958  Weight: 56.2 kg   Weight change:  Body mass index is 18.85 kg/m.   Physical Exam:  General: Frail and debilitated.  Currently looking comfortable. Cardiovascular: S1-S2 normal.  Regular rate rhythm. Respiratory: She looks comfortable without any accessory muscle use.  Right side has chest tube in place with actively draining hemothorax.  No airleak.  On suction. Gastrointestinal: Soft.  Nontender.  Bowel sounds present. Ext: No swelling or edema.  No cyanosis.    Data Review: I have personally reviewed the laboratory data and studies available.  F/u labs ordered Unresulted Labs (From admission, onward)     Start     Ordered   07/10/21 0500  CBC  Tomorrow morning,   R        07/09/21 3810            Signed, Dorcas Carrow, MD [facility] 07/10/2021  Total time spent: 30 minutes

## 2021-07-10 NOTE — Evaluation (Signed)
Physical Therapy Evaluation Patient Details Name: [name] MRN: [id number] DOB: [birth year] Today's Date: 07/10/2021  History of Present Illness  70 yo female presents to [facility] on 10/24 with bilat rib pain x4 days, fall x1 week ago. CT shows R rib 8-9 fx, R hemothorax, sternal fracture, chronic thoracic spine fractures. Chest tube placed 10/25. PMH includes anxiety, OA, asthma, depression, HTN, OP, R shoulder replacement, L hip pinning 2013, L TKR 2021, smoker, previous C spine surgeries.   Clinical Impression  Pt presents with weakness, chest wall and R rib pain, impaired activity tolerance, poor safety awareness, and decreased activity tolerance vs baseline Pt to benefit from acute PT to address deficits. Pt ambulated x2 short room distances, limited by chest wall discomfort. At baseline, pt is completely independent and would like to return to this level, recommending ST rehab. PT to progress mobility as tolerated, and will continue to follow acutely.         Recommendations for follow up therapy are one component of a multi-disciplinary discharge planning process, led by the attending physician.  Recommendations may be updated based on patient status, additional functional criteria and insurance authorization.  Follow Up Recommendations Skilled nursing-short term rehab (<3 hours/day)    Assistance Recommended at Discharge Frequent or constant Supervision/Assistance  Functional Status Assessment Patient has had a recent decline in their functional status and demonstrates the ability to make significant improvements in function in a reasonable and predictable amount of time.  Equipment Recommendations  None recommended by PT    Recommendations for Other Services       Precautions / Restrictions Precautions Precautions: Fall;Other (comment) Precaution Comments: R pigtail chest tube to wall suction, to water seal during mobility Restrictions Weight Bearing Restrictions: No Other  Position/Activity Restrictions: no brace needed for compression fractures per order      Mobility  Bed Mobility Overal bed mobility: Needs Assistance Bed Mobility: Supine to Sit     Supine to sit: Min assist     General bed mobility comments: min assist for log roll to EOB, trunk elevation.    Transfers Overall transfer level: Needs assistance Equipment used: Rolling walker (2 wheels) Transfers: Sit to/from Stand Sit to Stand: Min assist;From elevated surface           General transfer comment: min assist for power up, rise, steadying. STS x2, from EOB and elevated toilet.    Ambulation/Gait Ambulation/Gait assistance: Min assist Gait Distance (Feet): 10 Feet (x2 - to and from toilet) Assistive device: Rolling walker (2 wheels) Gait Pattern/deviations: Step-through pattern;Decreased stride length;Trunk flexed;Narrow base of support Gait velocity: decr   General Gait Details: assist to steady and maneuver RW, VSS during mobility with SPO30min 91% on RA. 2LO2 reapplied post-gait.  Stairs            Wheelchair Mobility    Modified Rankin (Stroke Patients Only)       Balance Overall balance assessment: Needs assistance;History of Falls Sitting-balance support: No upper extremity supported Sitting balance-Leahy Scale: Fair     Standing balance support: Bilateral upper extremity supported;During functional activity Standing balance-Leahy Scale: Poor Standing balance comment: reliant on external assist                             Pertinent Vitals/Pain Pain Assessment: 0-10 Pain Score: 7  Pain Location: chest wall, R ribs Pain Descriptors / Indicators: Sore;Discomfort;Grimacing;Guarding Pain Intervention(s): Monitored during session;Limited activity within patient's tolerance;Repositioned  Home Living Family/patient expects to be discharged to:: Shelter/Homeless                        Prior Function Prior Level of Function :  Independent/Modified Independent             Mobility Comments: pt walking with rollator PTA, states she does not use car or bus for transportation and only walks. Pt becomes tearful when PT asks pt about plan for where to stay at d/c, states "don't ask me that, it makes me very stressed"       Hand Dominance   Dominant Hand: Right    Extremity/Trunk Assessment   Upper Extremity Assessment Upper Extremity Assessment: Defer to OT evaluation    Lower Extremity Assessment Lower Extremity Assessment: Generalized weakness    Cervical / Trunk Assessment Cervical / Trunk Assessment: Other exceptions Cervical / Trunk Exceptions: scoliosis noted  Communication   Communication: No difficulties  Cognition Arousal/Alertness: Awake/alert Behavior During Therapy: Restless;Anxious;Impulsive Overall Cognitive Status: Impaired/Different from baseline Area of Impairment: Safety/judgement;Problem solving                         Safety/Judgement: Decreased awareness of deficits;Decreased awareness of safety   Problem Solving: Difficulty sequencing;Requires verbal cues;Requires tactile cues General Comments: pt is anxious and impulsive, requires max safety cues during mobility. Pt behaves somewhat erratically, throws RW around with chest tube on it        General Comments      Exercises     Assessment/Plan    PT Assessment Patient needs continued PT services  PT Problem List Decreased strength;Decreased mobility;Decreased balance;Decreased knowledge of use of DME;Pain;Cardiopulmonary status limiting activity;Decreased activity tolerance;Decreased knowledge of precautions;Decreased safety awareness       PT Treatment Interventions DME instruction;Therapeutic activities;Gait training;Therapeutic exercise;Patient/family education;Balance training;Functional mobility training;Neuromuscular re-education    PT Goals (Current goals can be found in the Care Plan section)   Acute Rehab PT Goals PT Goal Formulation: With patient Time For Goal Achievement: 07/24/21 Potential to Achieve Goals: Good    Frequency Min 3X/week   Barriers to discharge Decreased caregiver support      Co-evaluation               AM-PAC PT "6 Clicks" Mobility  Outcome Measure Help needed turning from your back to your side while in a flat bed without using bedrails?: A Little Help needed moving from lying on your back to sitting on the side of a flat bed without using bedrails?: A Little Help needed moving to and from a bed to a chair (including a wheelchair)?: A Lot Help needed standing up from a chair using your arms (e.g., wheelchair or bedside chair)?: A Little Help needed to walk in hospital room?: A Little Help needed climbing 3-5 steps with a railing? : A Lot 6 Click Score: 16    End of Session   Activity Tolerance: Patient limited by pain;Patient limited by fatigue Patient left: in chair;with call bell/phone within reach;with chair alarm set;with nursing/sitter in room Nurse Communication: Mobility status PT Visit Diagnosis: Other abnormalities of gait and mobility (R26.89);Muscle weakness (generalized) (M62.81)    Time: 1610-9604 PT Time Calculation (min) (ACUTE ONLY): 22 min   Charges:   PT Evaluation $PT Eval Low Complexity: 1 Low         Marye Round, PT DPT Acute Rehabilitation Services Pager [phone]  Office [phone]   Tyrone Apple E Christain [place] 07/10/2021,  11:39 AM

## 2021-07-10 NOTE — Progress Notes (Signed)
Subjective: CC: Doing well. Having some pain along the right chest wall that is well controlled. No other areas of pain. Denies abdominal pain or new extremity pain. Notes chronic problems of her right shoulder since past surgery including difficulty raising the arm overhead that is not new and denies pain to the area. I do see xrays dating back to 2021 w/ narrowing of the subacromial space is present and suspect for cuff deficiency which concurs with her report. She denies sob. On o2 still. She has not gotten oob. She reports she is voiding. She tolerated dinner yesterday (fish) without n/v. Last BM 10/23  Objective: Vital signs in last 24 hours: Temp:  [97.9 F (36.6 C)-98.6 F (37 C)] 98.2 F (36.8 C) (10/26 0705) Pulse Rate:  [60-85] 60 (10/26 0800) Resp:  [12-25] 17 (10/26 0800) BP: (112-148)/(45-86) 112/86 (10/26 0705) SpO2:  [91 %-100 %] 96 % (10/26 0800) Last BM Date: 07/07/21  Intake/Output from previous day: 10/25 0701 - 10/26 0700 In: -  Out: 910 [Chest Tube:910] Intake/Output this shift: Total I/O In: 240 [P.O.:240] Out: -   PE: Gen:  Alert, NAD, pleasant HEENT: EOM's intact, pupils equal and round Card:  RRR Pulm:  CTAB, no W/R/R, effort normal. On o2. R chest tube site c/d/I. R chest tube off suction, rehooked to -20. 910cc bloody fluid in [place]. No air leak.   Abd: Soft, ND, NT +BS Ext: No ttp to BUE and BLE's. She does have some difficulty raising right shoulder overhead that she reports is chronic, otherwise able active rom of all major joints of BUE and BLE's without pain. No LE edema or calf tenderness Psych: A&Ox3  Skin: no rashes noted, warm and dry  Lab Results:  Recent Labs    07/08/21 1306 07/09/21 0128 07/09/21 0633 07/10/21 0413  WBC 11.3*  --   --  11.7*  HGB 10.3*   < > 8.5* 7.9*  HCT 32.9*   < > 26.0* 23.9*  PLT 565*  --   --  405*   < > = values in this interval not displayed.   BMET Recent Labs    07/08/21 1306  07/10/21 0413  NA 136 136  K 3.9 3.8  CL 104 109  CO2 20* 19*  GLUCOSE 127* 119*  BUN 24* 19  CREATININE 0.91 0.62  CALCIUM 9.0 8.3*   PT/INR Recent Labs    07/08/21 1653  LABPROT 14.1  INR 1.1   CMP     Component Value Date/Time   NA 136 07/10/2021 0413   K 3.8 07/10/2021 0413   CL 109 07/10/2021 0413   CO2 19 (L) 07/10/2021 0413   GLUCOSE 119 (H) 07/10/2021 0413   BUN 19 07/10/2021 0413   CREATININE 0.62 07/10/2021 0413   CALCIUM 8.3 (L) 07/10/2021 0413   PROT 5.6 (L) 07/08/2021 1653   ALBUMIN 3.0 (L) 07/08/2021 1653   AST 17 07/08/2021 1653   ALT 9 07/08/2021 1653   ALKPHOS 73 07/08/2021 1653   BILITOT 0.2 (L) 07/08/2021 1653   GFRNONAA >60 07/10/2021 0413   GFRAA 45 (L) 03/27/2020 0855   Lipase     Component Value Date/Time   LIPASE 25 07/08/2021 1653    Studies/Results: CT ABDOMEN PELVIS WO CONTRAST  Result Date: 07/09/2021 CLINICAL DATA:  Fall. EXAM: CT ABDOMEN AND PELVIS WITHOUT CONTRAST TECHNIQUE: Multidetector CT imaging of the abdomen and pelvis was performed following the standard protocol without IV contrast. COMPARISON:  May 24, 2021.  July 08, 2021. FINDINGS: Lower chest: Minimal anterior pneumothorax is noted in the right lung base. Right-sided chest tube is noted. Right pleural effusion is noted with associated right lower lobe atelectasis. Moderate size sliding-type hiatal hernia is noted. Hepatobiliary: Small gallstone is noted. No biliary dilatation is noted. The liver is unremarkable on these unenhanced images. Pancreas: Unremarkable. No pancreatic ductal dilatation or surrounding inflammatory changes. Spleen: Normal in size without focal abnormality. Adrenals/Urinary Tract: Adrenal glands are unremarkable. Kidneys are normal, without renal calculi, focal lesion, or hydronephrosis. Bladder is unremarkable. Stomach/Bowel: The stomach appears normal. There is no evidence of bowel obstruction or inflammation. Vascular/Lymphatic: Aortic  atherosclerosis. No enlarged abdominal or pelvic lymph nodes. Reproductive: Status post hysterectomy. No adnexal masses. Other: No abdominal wall hernia or abnormality. No abdominopelvic ascites. Musculoskeletal: Probably acute moderate compression deformity of T9 vertebral body is noted. IMPRESSION: Minimal anterior pneumothorax is noted in the visualized portion of right lung base. Right-sided chest tube is again noted. Right pleural effusion is noted with associated right lower lobe atelectasis. Moderate size sliding-type hiatal hernia. Cholelithiasis without evidence of cholecystitis. Probably acute moderate compression fracture of T9 vertebral body. Aortic Atherosclerosis (ICD10-I70.0). Electronically Signed   By: Lupita Raider M.D.   On: 07/09/2021 14:22   DG Chest 2 View  Result Date: 07/08/2021 CLINICAL DATA:  Bilateral rib pain. EXAM: CHEST - 2 VIEW COMPARISON:  Chest x-ray dated June 29, 2021. FINDINGS: Normal heart size. New moderate right pleural effusion right middle and lower lobe atelectasis. No pneumothorax. No acute osseous abnormality. Old right-sided rib fractures. IMPRESSION: 1. New moderate right pleural effusion with right middle and lower lobe atelectasis. Electronically Signed   By: Obie Dredge M.D.   On: 07/08/2021 14:42   CT HEAD WO CONTRAST ( )  Result Date: 07/09/2021 CLINICAL DATA:  Fall EXAM: CT HEAD WITHOUT CONTRAST TECHNIQUE: Contiguous axial images were obtained from the base of the skull through the vertex without intravenous contrast. COMPARISON:  CT head 10/17/2008 FINDINGS: Brain: There is no acute intracranial hemorrhage, extra-axial fluid collection, or acute infarct. There is mild parenchymal volume loss with commensurate enlargement of the ventricular system. Hypodensity in the subcortical and periventricular white matter likely reflects sequela of moderate chronic white matter microangiopathy. There is no mass lesion.  There is no midline shift.  Vascular: There is calcification of the bilateral cavernous ICAs. Skull: Normal. Negative for fracture or focal lesion. Sinuses/Orbits: The imaged paranasal sinuses are clear. The globes and orbits are unremarkable. Other: None. IMPRESSION: 1. No acute intracranial pathology. 2. Moderate global parenchymal volume loss and chronic white matter microangiopathy. Electronically Signed   By: Lesia Hausen M.D.   On: 07/09/2021 14:45   CT Angio Chest PE W and/or Wo Contrast  Result Date: 07/08/2021 CLINICAL DATA:  Pt endorses bilateral rib pain since Friday. Denies SOB, but has pain with deep breathing. Denies any falls. EXAM: CT ANGIOGRAPHY CHEST WITH CONTRAST TECHNIQUE: Multidetector CT imaging of the chest was performed using the standard protocol during bolus administration of intravenous contrast. Multiplanar CT image reconstructions and MIPs were obtained to evaluate the vascular anatomy. CONTRAST:  31mL OMNIPAQUE IOHEXOL 350 MG/ML SOLN COMPARISON:  Chest x-ray 06/29/2021, CT chest 06/14/2020 FINDINGS: Cardiovascular: Satisfactory opacification of the pulmonary arteries to the segmental level. No evidence of pulmonary embolism. Normal heart size. No significant pericardial effusion. The thoracic aorta is normal in caliber. Mild atherosclerotic plaque of the thoracic aorta. At least 2 vessel coronary artery calcifications. Mediastinum/Nodes: No enlarged mediastinal, hilar,  or axillary lymph nodes. Thyroid gland, trachea, and esophagus demonstrate no significant findings. Small to moderate volume hiatal hernia. Lungs/Pleura: Partial collapse of the right lower lobe. No focal consolidation. No pulmonary nodule. No pulmonary mass. Moderate volume right pleural fluid with hyperdensity along the rib fractures consistent with a hemothorax. No left pleural effusion. No pneumothorax. Upper Abdomen: No acute abnormality. Musculoskeletal: Intra and extracapsular rupture of a bilateral breast implants. Associated  circumferential calcifications. Chronic, but worsened from 06/14/2020, compression fracture of the T10 vertebral body with approximately 50% vertebral body height loss. Multiple other thoracic vertebral bodies demonstrate decreased vertebral body height loss. Old healed left rib fractures.  Old healed right rib fractures. Interval development of an acute right posterior eighth and ninth rib fractures (compared to CT chest 06/14/2020, retrospectively these may have been present on x-ray ribs 06/29/2021). Acute minimally displaced mid sternal body fracture. Right shoulder arthroplasty partially visualized. Review of the MIP images confirms the above findings. IMPRESSION: 1. No pulmonary embolus. 2. Acute posterior right 8 and 9 rib fractures with associated moderate volume hemothorax. Retrospectively fractures may have been present on x-ray ribs 06/29/2021. 3. Acute minimally displaced mid sternal body fracture. 4. Small to moderate volume hiatal hernia. 5. Chronic, but worsened from 06/14/2020, compression fracture of the T10 vertebral body with approximately 50% vertebral body height loss. Recommend correlation with point tenderness to palpation for an acute component. 6. 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 Correlate with mammography. 7.  Aortic Atherosclerosis (ICD10-I70.0). Electronically Signed   By: Tish Frederickson M.D.   On: 07/08/2021 23:00   CT CERVICAL SPINE WO CONTRAST  Result Date: 07/09/2021 CLINICAL DATA:  Fall EXAM: CT CERVICAL SPINE WITHOUT CONTRAST TECHNIQUE: Multidetector CT imaging of the cervical spine was performed without intravenous contrast. Multiplanar CT image reconstructions were also generated. COMPARISON:  Cervical spine CT 12/01/2011, cervical spine radiographs 02/07/2020 FINDINGS: Alignment: There is straightening of the cervical spine curvature. There is trace anterolisthesis of C2 on C3, 3 mm anterolisthesis of C3 on C4 and C4 on C5, and 4 mm  anterolisthesis of T1 on T2, all slightly increased since 2013. There is no jumped or perched facet or other evidence of traumatic malalignment. Skull base and vertebrae: Skull base alignment is maintained. There are postsurgical changes reflecting ACDF spanning from C5 through T1 and separate anterior fusion at C4-C5. Posterior fusion hardware extends from C4 through T1. The patient is status post left-sided corpectomy at T7 there is partial osseous fusion across the C4-C5 level and solid-appearing fusion across the C5-C6, C6-C7, and C7-T1 levels. A portion of a previously fractured screw remains within the T1 vertebral body. There is increased irregularity of the superior T2 endplate with mild loss of vertebral body height anteriorly. There is no evidence of acute fracture. Soft tissues and spinal canal: There is no definite evidence of hematoma in the spinal canal, though evaluation is degraded by significant streak artifact. The soft tissues are otherwise unremarkable. Disc levels: Since 2013, there has been significant interval worsening of adjacent segment disease at T1-T2. The osseous spinal canal appears overall patent. There is multilevel facet arthropathy throughout the cervical spine resulting in moderate to severe bilateral neural foraminal stenosis at T1-T2. There is no definite high-grade neural foraminal stenosis at the remaining levels. Upper chest: A chest tube is seen coiled in the right apex. There is a partially imaged right pleural effusion. Patchy opacity in the left apex was not seen on the CTA chest from 1 day  prior. Other: None. IMPRESSION: 1. No acute fracture or traumatic malalignment of the cervical spine. 2. Postsurgical changes reflecting anterior and posterior instrumented fusion spanning from C4 through T1 without evidence of complication. 3. 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 4. Patchy opacities in the left apex were not definitely present on the  prior CT chest and may reflect developing infection or aspiration. Electronically Signed   By: Lesia Hausen M.D.   On: 07/09/2021 14:38   DG Chest Port 1 View  Result Date: 07/09/2021 CLINICAL DATA:  Respiratory failure.  Chest tube placement. EXAM: PORTABLE CHEST 1 VIEW COMPARISON:  CT angiogram chest 07/08/2021. Chest radiographs 07/08/2021. FINDINGS: Heart size at the upper limits of normal, unchanged. Interval placement of a right-sided chest tube with tip projecting at the level of the right lung apex. No evidence of pneumothorax. Apparent elevation of the right hemidiaphragm likely reflecting a small to moderate residual right pleural effusion/hemothorax. Additional ill-defined opacity within the mid to lower right lung field, and within the left lung base, likely reflecting atelectasis. Known right rib fractures, sternal fractures and vertebral fractures better characterized on the prior chest CT of 07/08/2021. Prior right shoulder arthroplasty. Fusion hardware within the cervical and upper thoracic spine. IMPRESSION: Interval placement of a right-sided chest tube with tip projecting at the level of the right lung apex. No pneumothorax. Significant interval decrease in size of a right pleural effusion/hemothorax, with a small-to-moderate volume residual. Atelectasis at the right greater than left lung bases. Electronically Signed   By: Jackey Loge D.O.   On: 07/09/2021 11:47   US Abdomen Limited RUQ (LIVER/GB)  Result Date: 07/08/2021 CLINICAL DATA:  Right upper quadrant abdominal pain, nausea vomiting. EXAM: ULTRASOUND ABDOMEN LIMITED RIGHT UPPER QUADRANT COMPARISON:  None. FINDINGS: Gallbladder: No gallstones or wall thickening visualized. No sonographic Murphy sign noted by sonographer. Small focus of adenomyomatosis. Common bile duct: Diameter: 5 mm Liver: No focal lesion identified. Within normal limits in parenchymal echogenicity. Portal vein is patent on color Doppler imaging with normal  direction of blood flow towards the liver. Other: Small right pleural effusion. IMPRESSION: Small right pleural effusion, otherwise unremarkable right upper quadrant ultrasound. Electronically Signed   By: Elgie Collard M.D.   On: 07/08/2021 19:57    Anti-infectives: Anti-infectives (From admission, onward)    None        Assessment/Plan GLF  R rib fx 8-9 - pain control, IS/pulm toilet R HTX - CT on -20, no air leak. Repeat CXR this AM pending. Monitor output Sternal fx - pain control, pulm toilet Worsened T10 compression fx - NSGY c/s, Dr. Jordan Likes. No brace needed ABL anemia - hgb 7.9 this am from 8.5. Repeat in AM.  FEN - Reg DVT - lovenox 30mg  BID  ID - none   LOS: 1 day    , [facility] 07/10/2021, 8:17 AM Please see Amion for pager number during day hours 7:00am-4:30pm

## 2021-07-10 NOTE — TOC CAGE-AID Note (Signed)
Transition of Care [facility]) - CAGE-AID Screening   Patient Details  Name: [name] MRN: [id number] Date of Birth: [birth year]  Transition of [facility]) CM/SW Contact:    Hutton Pellicane C Tarpley-Carter, LCSWA Phone Number: 07/10/2021, 12:42 PM   Clinical Narrative: Pt participated in Cage-Aid.  Pt stated she does not use substance or ETOH.  Pt has a past history of smoking and drinks ETOH socially.  Pt was not offered resources, due to no usage of substance or ETOH.     Odis Wickey Tarpley-Carter, MSW, LCSW-A Pronouns:  She/Her/Hers [facility]itions of Care Clinical Social Worker Direct Number:  [phone] [email or web address]   CAGE-AID Screening:    Have You Ever Felt You Ought to Cut Down on Your Drinking or Drug Use?: No Have People Annoyed You By [employer] Your Drinking Or Drug Use?: No Have You Felt Bad Or Guilty About Your Drinking Or Drug Use?: No Have You Ever Had a Drink or Used Drugs First Thing In The Morning to Steady Your Nerves or to Get Rid of a Hangover?: No CAGE-AID Score: 0  Substance Abuse Education Offered: No (Pt has a past history of smoking and drinks ETOH socially.)

## 2021-07-11 ENCOUNTER — Inpatient Hospital Stay (HOSPITAL_COMMUNITY): Payer: Medicare Other

## 2021-07-11 DIAGNOSIS — J942 Hemothorax: Secondary | ICD-10-CM | POA: Diagnosis not present

## 2021-07-11 MED ORDER — NICOTINE 14 MG/24HR TD PT24
14.0000 mg | MEDICATED_PATCH | Freq: Every day | TRANSDERMAL | Status: DC
  Administered 2021-07-11 – 2021-07-19 (×9): 14 mg via TRANSDERMAL
  Filled 2021-07-11 (×10): qty 1

## 2021-07-11 NOTE — Progress Notes (Signed)
Subjective: CC: Doing well overall.  Pain over the right chest as well controlled with medications.  No current shortness of breath.  Tolerating diet without any nausea or vomiting.  No other areas of pain or complaints.  Pulling 500 on I-S.  100 cc from chest tube in last 24 hours.  Objective: Vital signs in last 24 hours: Temp:  [98.3 F (36.8 C)-98.6 F (37 C)] 98.5 F (36.9 C) (10/27 0802) Pulse Rate:  [70-79] 76 (10/27 0802) Resp:  [16-23] 16 (10/27 0802) BP: (117-139)/(42-68) 132/65 (10/27 0802) SpO2:  [89 %-95 %] 91 % (10/27 0802) Last BM Date: 07/07/21  Intake/Output from previous day: 10/26 0701 - 10/27 0700 In: 240 [P.O.:240] Out: 50 [Chest Tube:50] Intake/Output this shift: No intake/output data recorded.  PE: Gen:  Alert, NAD, pleasant HEENT: EOM's intact, pupils equal and round Card:  RRR Pulm:  CTAB, no W/R/R, effort normal. On o2. R chest tube site c/d/I. Tubing was kinked - I unkinked this and taped lower to prevent this from happening. R chest tube on -20. 1250cc bloody fluid in [place], 100cc/24 hours. No air leak. 500 on IS Abd: Soft, ND, NT +BS Ext: No LE edema or calf tenderness. MAE's Psych: A&Ox3  Skin: no rashes noted, warm and dry  Lab Results:  Recent Labs    07/08/21 1306 07/09/21 0128 07/09/21 0633 07/10/21 0413  WBC 11.3*  --   --  11.7*  HGB 10.3*   < > 8.5* 7.9*  HCT 32.9*   < > 26.0* 23.9*  PLT 565*  --   --  405*   < > = values in this interval not displayed.   BMET Recent Labs    07/08/21 1306 07/10/21 0413  NA 136 136  K 3.9 3.8  CL 104 109  CO2 20* 19*  GLUCOSE 127* 119*  BUN 24* 19  CREATININE 0.91 0.62  CALCIUM 9.0 8.3*   PT/INR Recent Labs    07/08/21 1653  LABPROT 14.1  INR 1.1   CMP     Component Value Date/Time   NA 136 07/10/2021 0413   K 3.8 07/10/2021 0413   CL 109 07/10/2021 0413   CO2 19 (L) 07/10/2021 0413   GLUCOSE 119 (H) 07/10/2021 0413   BUN 19 07/10/2021 0413   CREATININE 0.62  07/10/2021 0413   CALCIUM 8.3 (L) 07/10/2021 0413   PROT 5.6 (L) 07/08/2021 1653   ALBUMIN 3.0 (L) 07/08/2021 1653   AST 17 07/08/2021 1653   ALT 9 07/08/2021 1653   ALKPHOS 73 07/08/2021 1653   BILITOT 0.2 (L) 07/08/2021 1653   GFRNONAA >60 07/10/2021 0413   GFRAA 45 (L) 03/27/2020 0855   Lipase     Component Value Date/Time   LIPASE 25 07/08/2021 1653    Studies/Results: CT ABDOMEN PELVIS WO CONTRAST  Result Date: 07/09/2021 CLINICAL DATA:  Fall. EXAM: CT ABDOMEN AND PELVIS WITHOUT CONTRAST TECHNIQUE: Multidetector CT imaging of the abdomen and pelvis was performed following the standard protocol without IV contrast. COMPARISON:  May 24, 2021.  July 08, 2021. FINDINGS: Lower chest: Minimal anterior pneumothorax is noted in the right lung base. Right-sided chest tube is noted. Right pleural effusion is noted with associated right lower lobe atelectasis. Moderate size sliding-type hiatal hernia is noted. Hepatobiliary: Small gallstone is noted. No biliary dilatation is noted. The liver is unremarkable on these unenhanced images. Pancreas: Unremarkable. No pancreatic ductal dilatation or surrounding inflammatory changes. Spleen: Normal in size without focal  abnormality. Adrenals/Urinary Tract: Adrenal glands are unremarkable. Kidneys are normal, without renal calculi, focal lesion, or hydronephrosis. Bladder is unremarkable. Stomach/Bowel: The stomach appears normal. There is no evidence of bowel obstruction or inflammation. Vascular/Lymphatic: Aortic atherosclerosis. No enlarged abdominal or pelvic lymph nodes. Reproductive: Status post hysterectomy. No adnexal masses. Other: No abdominal wall hernia or abnormality. No abdominopelvic ascites. Musculoskeletal: Probably acute moderate compression deformity of T9 vertebral body is noted. IMPRESSION: Minimal anterior pneumothorax is noted in the visualized portion of right lung base. Right-sided chest tube is again noted. Right pleural  effusion is noted with associated right lower lobe atelectasis. Moderate size sliding-type hiatal hernia. Cholelithiasis without evidence of cholecystitis. Probably acute moderate compression fracture of T9 vertebral body. Aortic Atherosclerosis (ICD10-I70.0). Electronically Signed   By: Lupita Raider M.D.   On: 07/09/2021 14:22   CT HEAD WO CONTRAST ( )  Result Date: 07/09/2021 CLINICAL DATA:  Fall EXAM: CT HEAD WITHOUT CONTRAST TECHNIQUE: Contiguous axial images were obtained from the base of the skull through the vertex without intravenous contrast. COMPARISON:  CT head 10/17/2008 FINDINGS: Brain: There is no acute intracranial hemorrhage, extra-axial fluid collection, or acute infarct. There is mild parenchymal volume loss with commensurate enlargement of the ventricular system. Hypodensity in the subcortical and periventricular white matter likely reflects sequela of moderate chronic white matter microangiopathy. There is no mass lesion.  There is no midline shift. Vascular: There is calcification of the bilateral cavernous ICAs. Skull: Normal. Negative for fracture or focal lesion. Sinuses/Orbits: The imaged paranasal sinuses are clear. The globes and orbits are unremarkable. Other: None. IMPRESSION: 1. No acute intracranial pathology. 2. Moderate global parenchymal volume loss and chronic white matter microangiopathy. Electronically Signed   By: Lesia Hausen M.D.   On: 07/09/2021 14:45   CT CERVICAL SPINE WO CONTRAST  Result Date: 07/09/2021 CLINICAL DATA:  Fall EXAM: CT CERVICAL SPINE WITHOUT CONTRAST TECHNIQUE: Multidetector CT imaging of the cervical spine was performed without intravenous contrast. Multiplanar CT image reconstructions were also generated. COMPARISON:  Cervical spine CT 12/01/2011, cervical spine radiographs 02/07/2020 FINDINGS: Alignment: There is straightening of the cervical spine curvature. There is trace anterolisthesis of C2 on C3, 3 mm anterolisthesis of C3 on C4 and  C4 on C5, and 4 mm anterolisthesis of T1 on T2, all slightly increased since 2013. There is no jumped or perched facet or other evidence of traumatic malalignment. Skull base and vertebrae: Skull base alignment is maintained. There are postsurgical changes reflecting ACDF spanning from C5 through T1 and separate anterior fusion at C4-C5. Posterior fusion hardware extends from C4 through T1. The patient is status post left-sided corpectomy at T7 there is partial osseous fusion across the C4-C5 level and solid-appearing fusion across the C5-C6, C6-C7, and C7-T1 levels. A portion of a previously fractured screw remains within the T1 vertebral body. There is increased irregularity of the superior T2 endplate with mild loss of vertebral body height anteriorly. There is no evidence of acute fracture. Soft tissues and spinal canal: There is no definite evidence of hematoma in the spinal canal, though evaluation is degraded by significant streak artifact. The soft tissues are otherwise unremarkable. Disc levels: Since 2013, there has been significant interval worsening of adjacent segment disease at T1-T2. The osseous spinal canal appears overall patent. There is multilevel facet arthropathy throughout the cervical spine resulting in moderate to severe bilateral neural foraminal stenosis at T1-T2. There is no definite high-grade neural foraminal stenosis at the remaining levels. Upper chest: A chest tube is  seen coiled in the right apex. There is a partially imaged right pleural effusion. Patchy opacity in the left apex was not seen on the CTA chest from 1 day prior. Other: None. IMPRESSION: 1. No acute fracture or traumatic malalignment of the cervical spine. 2. Postsurgical changes reflecting anterior and posterior instrumented fusion spanning from C4 through T1 without evidence of complication. 3. 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 4. Patchy opacities in the left apex were not  definitely present on the prior CT chest and may reflect developing infection or aspiration. Electronically Signed   By: Lesia Hausen M.D.   On: 07/09/2021 14:38   DG CHEST PORT 1 VIEW  Result Date: 07/11/2021 CLINICAL DATA:  Hemothorax EXAM: PORTABLE CHEST 1 VIEW COMPARISON:  July 10, 2021 FINDINGS: The cardiomediastinal silhouette is unchanged in contour.RIGHT-sided chest tube. Small RIGHT pleural effusion. No significant pneumothorax. Scattered bibasilar atelectasis. Visualized abdomen is unremarkable. Calcified breast implants. Status post RIGHT shoulder arthroplasty. Status post ACDF and posterior fixation of the cervical spine. Multilevel degenerative changes of the thoracolumbar spine with revisualization of wedging near the thoracolumbar junction. IMPRESSION: Scattered bibasilar atelectasis a small RIGHT pleural effusion. No significant pneumothorax. Electronically Signed   By: Meda Klinefelter M.D.   On: 07/11/2021 08:06   DG Chest Port 1 View  Result Date: 07/10/2021 CLINICAL DATA:  70 year old female with RIGHT-sided chest tube. EXAM: PORTABLE CHEST 1 VIEW COMPARISON:  January 07, 2021 and CT of the chest January 06, 2021. FINDINGS: Insertion of RIGHT-sided chest tube since October 24th, position stable when compared to July 09, 2021. Tip in the RIGHT lung apex. EKG leads projecting over the chest. Cardiomediastinal contours are stable. Improved aeration at the LEFT lung base compared to the recent chest x-ray. Improved aeration at the RIGHT lung base since October 24th, still with some graded opacity suggests residual pleural fluid. Linear opacities within aerated portions of lung compatible with atelectasis. No visible pneumothorax. Osteopenia. RIGHT shoulder arthroplasty and signs of cervical spinal fusion. IMPRESSION: Increased lung volumes slightly since previous imaging. Suspect persistent small RIGHT pleural effusion and concomitant atelectasis. No visible pneumothorax. Stable  appearance of the RIGHT chest tube. Electronically Signed   By: Donzetta Kohut M.D.   On: 07/10/2021 13:48   DG Chest Port 1 View  Result Date: 07/09/2021 CLINICAL DATA:  Respiratory failure.  Chest tube placement. EXAM: PORTABLE CHEST 1 VIEW COMPARISON:  CT angiogram chest 07/08/2021. Chest radiographs 07/08/2021. FINDINGS: Heart size at the upper limits of normal, unchanged. Interval placement of a right-sided chest tube with tip projecting at the level of the right lung apex. No evidence of pneumothorax. Apparent elevation of the right hemidiaphragm likely reflecting a small to moderate residual right pleural effusion/hemothorax. Additional ill-defined opacity within the mid to lower right lung field, and within the left lung base, likely reflecting atelectasis. Known right rib fractures, sternal fractures and vertebral fractures better characterized on the prior chest CT of 07/08/2021. Prior right shoulder arthroplasty. Fusion hardware within the cervical and upper thoracic spine. IMPRESSION: Interval placement of a right-sided chest tube with tip projecting at the level of the right lung apex. No pneumothorax. Significant interval decrease in size of a right pleural effusion/hemothorax, with a small-to-moderate volume residual. Atelectasis at the right greater than left lung bases. Electronically Signed   By: Jackey Loge D.O.   On: 07/09/2021 11:47    Anti-infectives: Anti-infectives (From admission, onward)    None        Assessment/Plan  GLF  R rib fx 8-9 - pain control, IS/pulm toilet R HTX - CXR w/ small R pleural effusion. No PTX. CT output 100/24 hours. Place on WS. Repeat CXR this AM pending. Monitor output Sternal fx - pain control, pulm toilet Worsened T10 compression fx - NSGY c/s, Dr. Jordan Likes. No brace needed ABL anemia - hgb pending this am  FEN - Reg DVT - lovenox 30mg  BID  ID - none   LOS: 2 days    , [facility] Surgery 07/11/2021, 8:50  AM Please see Amion for pager number during day hours 7:00am-4:30pm

## 2021-07-11 NOTE — Progress Notes (Signed)
PROGRESS NOTE  [name]  DOB: 1950-09-28  PCP: Paulino Door, MD NLG:[id number]  DOA: 07/08/2021  LOS: 2 days  Hospital Day: 4  Chief Complaint  Patient presents with   Chest Pain    Brief narrative: [name] is a 70 y.o. female with PMH significant for HTN, HLD, chronic smoking, mitral valve prolapse, palpitation, anxiety/depression, asthma, chronic pain, osteopenia, osteoporosis who has been homeless for the last year and a half and has been moving between different shelters in peoples homes. Patient presented to the ED on 10/24 with complaint of right anterior rib pain on deep inspiration ongoing for 4 to 5 days.  In the ED, she was was afebrile, normotensive, initially maintaining oxygen saturation but while in the ED saturation dropped to 70s and she was placed on 2 L oxygen by nasal cannula.  On exam, she was noted to have numerous bruises on forehead and upper extremity but she could not recall what happened.  She also was noted to have difficulty with memory. Labs showed D-dimer elevated 1.56 CTA of the chest was showed right eighth and ninth breath fracture, displaced mid sternal body fracture with large right-sided hemothorax.  Hemoglobin of 10.3 which down from 13.5 in September.   Patient had presented to the emergency room on 10/15 with injuries, chest x-rays were without definite evidence of fracture or complications so she was discharged from ER. Chest tube was placed by surgery on 10/25.   Subjective:  Seen and examined.  No overnight events.  Someone upset at her so she wanted some Ativan.  Assessment/Plan: Hemothorax in the setting of acute right 8 to 9 rib fracture and displaced mid sternal body fracture -Underwent chest tube placement by trauma surgery. -Followed by surgery.  Today on waterseal.  Active chest physiotherapy, incentive spirometry and mobility today.   Acute blood loss anemia -Baseline hemoglobin normal, presented with hemoglobin low at  10.3 and overnight dropped to between 8 and 9, has been stable and there for last few readings.   -Continue to monitor, transfuse if less than 7.   Recent Labs    07/08/21 1306 07/09/21 0128 07/09/21 0250 07/09/21 0401 07/09/21 0633 07/10/21 0413  HGB 10.3* 8.9* 8.6* 8.6* 8.5* 7.9*  MCV 95.4  --   --   --   --  91.6   Acute hypoxia secondary to large hemothorax -Patient was maintaining saturation on presentation but dropped to 70s while in the ED and required 2 L oxygen.  Currently on room air.   Recent fall Chronic T10 compression fracture -Patient asymptomatic at this time -Per trauma surgery, compression fracture seems worse in recent scan.  Seen by neurosurgery.  No surgical intervention recommended.    Anxiety/depression - Continue antidepressives, Ativan as needed.  Patient on multiple medications at home including trazodone, Wellbutrin and Remeron that she will continue.   Mobility: Encourage ambulation.  PT eval Living condition: Homeless Goals of care:   Code Status: Full Code  Nutritional status: Body mass index is 18.85 kg/m.     Diet:  Diet Order             Diet regular Room service appropriate? Yes; Fluid consistency: Thin  Diet effective now                  DVT prophylaxis:  enoxaparin (LOVENOX) injection 30 mg Start: 07/09/21 2200 SCDs Start: 07/09/21 0035   Antimicrobials: None Fluid: None Consultants: Trauma surgery Family Communication: None at bedside  Status  is: Inpatient  Remains inpatient appropriate because: Actively managed chest tube with hemothorax.  Dispo: The patient is from: Homeless              Anticipated d/c is to: Skilled nursing facility.              Patient currently is not medically stable to d/c.   Difficult to place patient No     Infusions:    Scheduled Meds:  amphetamine-dextroamphetamine  20 mg Oral BID WC   buPROPion  150 mg Oral QHS   enoxaparin (LOVENOX) injection  30 mg Subcutaneous Q12H    mirtazapine  15 mg Oral QHS   traZODone  300 mg Oral QHS   venlafaxine XR  150 mg Oral Q breakfast    PRN meds: HYDROcodone-acetaminophen, LORazepam, methocarbamol, morphine injection   Antimicrobials: Anti-infectives (From admission, onward)    None       Objective: Vitals:   07/11/21 0802 07/11/21 1144  BP: 132/65 (!) 120/58  Pulse: 76 84  Resp: 16 (!) 21  Temp: 98.5 F (36.9 C) 99.5 F (37.5 C)  SpO2: 91% 91%    Intake/Output Summary (Last 24 hours) at 07/11/2021 1242 Last data filed at 07/11/2021 0330 Gross per 24 hour  Intake --  Output 50 ml  Net -50 ml   Filed Weights   07/08/21 1958  Weight: 56.2 kg   Weight change:  Body mass index is 18.85 kg/m.   Physical Exam:  General: Frail and debilitated.  Anxious.  Looks comfortable. Cardiovascular: S1-S2 normal.  Regular rate rhythm. Respiratory: She looks comfortable without any accessory muscle use.  Right side has chest tube in place with actively draining hemothorax.  No airleak.  Changed to waterseal today. Gastrointestinal: Soft.  Nontender.  Bowel sounds present. Ext: No swelling or edema.  No cyanosis.    Data Review: I have personally reviewed the laboratory data and studies available.  F/u labs ordered Unresulted Labs (From admission, onward)     Start     Ordered   07/10/21 0500  CBC  Tomorrow morning,   R        07/09/21 2947            Signed, Dorcas Carrow, MD [facility] 07/11/2021  Total time spent: 30 minutes

## 2021-07-11 NOTE — TOC Initial Note (Signed)
Transition of Care [facility]) - Initial/Assessment Note    Patient Details  Name: [name] MRN: [id number] Date of Birth: 17-Feb-1951  [facility] CM/SW Contact:    Vinie Sill, LCSW Phone Number: 07/11/2021, 8:14 PM  Clinical Narrative:                  CSW met with patient at bedside. CSW introduced self and explained role. CSW discuss therapy recommendations. Patient states she understands recommendations and is agreeable to short term rehab at [facility]. CSW explained the SNF process and possible placement barrier(homeless), including pending on progress her recommendations may change. Patient states understanding.  Patient inquired about ALFs. Patient receives social security/disability income. CSW informed majority, if not all of her income would go to the ALF. Patent was uncertain if she wanted to pursue ALF but was agreeable to CSW sending referrals to assist her in locating possible placement in the future.  CSW was informed about [facility] in [place], that may have availability. CSW was given permission to send referral to [facility] and per her request [facility]. CSW was unable to make the referral- will follow up   Patient reports she has been homeless for about 1-1/2 years.  She was currently at the [place] encouraged patient to provide names of possible emergency contact persons. She states she has a sister Kelby Aline, that resides in [place] but they do not speak. Her ex-husband is Sarajane Marek, but requested, CSW not call him until she talks with him.  CSW provide therapeutic listening and encourage the patient to explore what will be her plan once medially stable and if she really wants to pursue permanent housing, CSW was available to provide support. Patient states no questions or noted any concerns at this time.  TOC will continue to follow and assist with discharge planning.   Thurmond Butts, MSW, LCSW Clinical Social  Worker      Expected Discharge Plan: Skilled Nursing Facility Barriers to Discharge: Continued Medical Work up, SNF Pending bed offer (homeless)   Patient Goals and CMS Choice        Expected Discharge Plan and Services Expected Discharge Plan: [place] In-house Referral: Clinical Social Work     Living arrangements for the past 2 months: Hotel/Motel, Homeless Shelter                                      Prior Living Arrangements/Services Living arrangements for the past 2 months: Hotel/Motel, Homeless Shelter Lives with:: Self                   Activities of Daily Living      Permission Sought/Granted         Permission granted to share info w AGENCY: SNF        Emotional Assessment   Attitude/Demeanor/Rapport: Engaged Affect (typically observed): Appropriate, Pleasant Orientation: : Oriented to Self, Oriented to Place, Oriented to  Time, Oriented to Situation Alcohol / Substance Use: Tobacco Use Psych Involvement: No (comment)  Admission diagnosis:  Respiratory failure ([place]) [J96.90] Hemothorax [J94.2] Epigastric abdominal pain [R10.13] RUQ abdominal pain [R10.11] Hemothorax on right [J94.2] Exertional shortness of breath [R06.02] Closed fracture of multiple ribs of right side, initial encounter [S22.41XA] Patient Active Problem List   Diagnosis Date Noted   Hemothorax 07/09/2021   Acute respiratory failure with hypoxia ([place])  07/09/2021   Acute posthemorrhagic anemia 07/09/2021   Compression fracture of T10 vertebra ([place]) 07/09/2021   Cognitive impairment 07/09/2021   Anxiety 07/09/2021   Arthritis of left knee 03/27/2020   Humeral surgical neck fracture 02/27/2012    Class: Acute   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

## 2021-07-11 NOTE — Progress Notes (Signed)
I picked up iPhone that had fallen to the end of the pt's bed and went to place it on pt's bedside table.  Pt reports that the phone does not belong to her.  She states that it was brought up in her belongings.  The screen saver pic on the phone was that of a young black female.  Security contacted to come and obtain the phone.  [place] with security took possession of phone and will place in security's lost and found.  Pt notified iPhone given to security.

## 2021-07-11 NOTE — Evaluation (Signed)
Occupational Therapy Evaluation Patient Details Name: [name] MRN: [id number] DOB: 03-11-1951 Today's Date: 07/11/2021   History of Present Illness 70 yo female presents to [facility] on 10/24 with bilat rib pain x4 days, fall x1 week ago. CT shows R rib 8-9 fx, R hemothorax, sternal fracture, chronic thoracic spine fractures. Chest tube placed 10/25. PMH includes anxiety, OA, asthma, depression, HTN, OP, R shoulder replacement, L hip pinning 2013, L TKR 2021, smoker, previous C spine surgeries. Multiple ED visits.   Clinical Impression   PTA pt is homeless and used a rollator to mobilize and complete ADL tasks.  Requires min A with mobility and Mod A with ADL at this time. Would benefit from rehab at [facility]. SpO2 91-93 on RA/VSS during activity without c.o SOB. Encouraged pt to increase her time OOB and to complete her incentive spirometer. Will follow acutely to facilitate DC to next venue of care.      Recommendations for follow up therapy are one component of a multi-disciplinary discharge planning process, led by the attending physician.  Recommendations may be updated based on patient status, additional functional criteria and insurance authorization.   Follow Up Recommendations  Skilled nursing-short term rehab (<3 hours/day)    Assistance Recommended at Discharge    Functional Status Assessment  Patient has had a recent decline in their functional status and demonstrates the ability to make significant improvements in function in a reasonable and predictable amount of time.  Equipment Recommendations  None recommended by OT    Recommendations for Other Services       Precautions / Restrictions Precautions Precautions: Fall;Other (comment) Precaution Comments: R pigtail chest tube to water seal Restrictions Other Position/Activity Restrictions: no brace needed for compression fractures per order      Mobility Bed Mobility Overal bed mobility: Needs Assistance Bed Mobility:  Supine to Sit     Supine to sit: Min assist     General bed mobility comments: min assist for log roll to EOB, trunk elevation.    Transfers Overall transfer level: Needs assistance Equipment used: Rolling walker (2 wheels) Transfers: Sit to/from Stand Sit to Stand: Min assist;From elevated surface           General transfer comment: min assist for power up, rise, steadying. STS x2, from EOB and elevated toilet.      Balance Overall balance assessment: Needs assistance;History of Falls Sitting-balance support: No upper extremity supported Sitting balance-Leahy Scale: Fair     Standing balance support: Bilateral upper extremity supported;During functional activity Standing balance-Leahy Scale: Poor Standing balance comment: reliant on external assist                           ADL either performed or assessed with clinical judgement   ADL Overall ADL's : Needs assistance/impaired     Grooming: Minimal assistance   Upper Body Bathing: Minimal assistance   Lower Body Bathing: Minimal assistance;Sit to/from stand   Upper Body Dressing : Moderate assistance   Lower Body Dressing: Moderate assistance   Toilet Transfer: Minimal assistance;BSC;Rolling walker (2 wheels)   Toileting- Clothing Manipulation and Hygiene: Min guard       Functional mobility during ADLs: Minimal assistance;Rolling walker (2 wheels)       Vision         Perception     Praxis      Pertinent Vitals/Pain Pain Assessment: Faces Faces Pain Scale: Hurts even more Pain Location: chest wall, R ribs  Pain Descriptors / Indicators: Sore;Discomfort;Grimacing;Guarding Pain Intervention(s): Limited activity within patient's tolerance;Premedicated before session     Hand Dominance Right   Extremity/Trunk Assessment Upper Extremity Assessment Upper Extremity Assessment: Generalized weakness (Apparent B RTC insufficiency with @ 30 FF B)   Lower Extremity Assessment Lower  Extremity Assessment: Generalized weakness   Cervical / Trunk Assessment Cervical / Trunk Assessment: Other exceptions Cervical / Trunk Exceptions: compression fx   Communication Communication Communication: No difficulties   Cognition Arousal/Alertness: Awake/alert Behavior During Therapy: Restless;Anxious;Impulsive Overall Cognitive Status: Impaired/Different from baseline Area of Impairment: Safety/judgement;Problem solving;Memory;Attention;Awareness                   Current Attention Level: Selective Memory: Decreased short-term memory   Safety/Judgement: Decreased awareness of deficits;Decreased awareness of safety Awareness: Emergent   General Comments: impulsive; restless; most likley at baseline     General Comments  multiple wounds BLE; SpO2 91-93 during activity on RA    Exercises Exercises: Other exercises Other Exercises Other Exercises: incentive spriometer x 5 - pulled   Shoulder Instructions      Home Living Family/patient expects to be discharged to:: Shelter/Homeless                                        Prior Functioning/Environment Prior Level of Function : Independent/Modified Independent             Mobility Comments: pt walking with rollator PTA, states she does not use car or bus for transportation and only walks. Pt becomes tearful when PT asks pt about plan for where to stay at d/c, states "don't ask me that, it makes me very stressed"          OT Problem List: Decreased strength;Decreased activity tolerance;Decreased range of motion;Impaired balance (sitting and/or standing);Decreased safety awareness;Decreased knowledge of use of DME or AE;Cardiopulmonary status limiting activity;Pain;Impaired UE functional use      OT Treatment/Interventions: Self-care/ADL training;Therapeutic exercise;Energy conservation;DME and/or AE instruction;Therapeutic activities;Patient/family education;Balance training    OT  Goals(Current goals can be found in the care plan section) Acute Rehab OT Goals Patient Stated Goal: to rest in bed OT Goal Formulation: With patient Time For Goal Achievement: 07/25/21 Potential to Achieve Goals: Good  OT Frequency: Min 2X/week   Barriers to D/C:  (homeless)          Co-evaluation              AM-PAC OT "6 Clicks" Daily Activity     Outcome Measure Help from another person eating meals?: None Help from another person taking care of personal grooming?: A Little Help from another person toileting, which includes using toliet, bedpan, or urinal?: A Little Help from another person bathing (including washing, rinsing, drying)?: A Little Help from another person to put on and taking off regular upper body clothing?: A Lot Help from another person to put on and taking off regular lower body clothing?: A Lot 6 Click Score: 17   End of Session Equipment Utilized During Treatment: Rolling walker (2 wheels);Oxygen (2L) Nurse Communication: Mobility status;Other (comment) (encourage OOB/use of incentive spirometer)  Activity Tolerance: Patient tolerated treatment well Patient left: in chair;with call bell/phone within reach;with chair alarm set  OT Visit Diagnosis: Unsteadiness on feet (R26.81);Other abnormalities of gait and mobility (R26.89);Muscle weakness (generalized) (M62.81);Pain Pain - Right/Left: Right Pain - part of body:  (chest/back)  Time: 1443-1540 OT Time Calculation (min): 18 min Charges:  OT General Charges $OT Visit: 1 Visit OT Evaluation $OT Eval Moderate Complexity: 1 Mod  Tighe Gitto, OT/L   Acute OT Clinical Specialist Acute Rehabilitation Services Pager [phone] Office [phone]   [facility] 07/11/2021, 9:52 AM

## 2021-07-12 ENCOUNTER — Inpatient Hospital Stay (HOSPITAL_COMMUNITY): Payer: Medicare Other

## 2021-07-12 DIAGNOSIS — J942 Hemothorax: Secondary | ICD-10-CM | POA: Diagnosis not present

## 2021-07-12 LAB — CBC
HCT: 26.8 % — ABNORMAL LOW (ref 36.0–46.0)
Hemoglobin: 8.5 g/dL — ABNORMAL LOW (ref 12.0–15.0)
MCH: 29.8 pg (ref 26.0–34.0)
MCHC: 31.7 g/dL (ref 30.0–36.0)
MCV: 94 fL (ref 80.0–100.0)
Platelets: 529 10*3/uL — ABNORMAL HIGH (ref 150–400)
RBC: 2.85 MIL/uL — ABNORMAL LOW (ref 3.87–5.11)
RDW: 13.8 % (ref 11.5–15.5)
WBC: 12.2 10*3/uL — ABNORMAL HIGH (ref 4.0–10.5)
nRBC: 0 % (ref 0.0–0.2)

## 2021-07-12 NOTE — TOC Progression Note (Addendum)
Transition of Care [facility]) - Progression Note    Patient Details  Name: [name] MRN: [id number] Date of Birth: [birth year]  Transition of Care [facility]) CM/SW Contact  Carley Hammed, [place] Phone Number: 07/12/2021, 1:55 PM  Clinical Narrative:    CSW sent a referral to [facility]. CSW attempted to send a referral to [facility], but was unable to contact facility. TOC will continue to follow for placement determination.  245 CSW sent referral to [facility]). Marcelino Duster is contact [phone]  Expected Discharge Plan: Skilled Nursing Facility Barriers to Discharge: Continued Medical Work up, SNF Pending bed offer (homeless)  Expected Discharge Plan and Services Expected Discharge Plan: Skilled Nursing Facility In-house Referral: Clinical Social Work     Living arrangements for the past 2 months: Hotel/Motel, Homeless Shelter                                       Social Determinants of Health (SDOH) Interventions    Readmission Risk Interventions No flowsheet data found.

## 2021-07-12 NOTE — Progress Notes (Signed)
PROGRESS NOTE  [name]  DOB: 1951-03-12  PCP: Paulino Door, MD DDU:[id number]  DOA: 07/08/2021  LOS: 3 days  Hospital Day: 5  Chief Complaint  Patient presents with   Chest Pain    Brief narrative: [name] is a 70 y.o. female with PMH significant for HTN, HLD, chronic smoking, mitral valve prolapse, palpitation, anxiety/depression, asthma, chronic pain, osteopenia, osteoporosis who has been homeless for the last year and a half and has been moving between different shelters in peoples homes. Patient presented to the ED on 10/24 with complaint of right anterior rib pain on deep inspiration ongoing for 4 to 5 days.  In the ED, she was was afebrile, normotensive, initially maintaining oxygen saturation but while in the ED saturation dropped to 70s and she was placed on 2 L oxygen by nasal cannula.  On exam, she was noted to have numerous bruises on forehead and upper extremity but she could not recall what happened.  She also was noted to have difficulty with memory. Labs showed D-dimer elevated 1.56 CTA of the chest was showed right eighth and ninth breath fracture, displaced mid sternal body fracture with large right-sided hemothorax.  Hemoglobin of 10.3 which down from 13.5 in September.   Patient had presented to the emergency room on 10/15 with injuries, chest x-rays were without definite evidence of fracture or complications so she was discharged from ER. Chest tube was placed by surgery on 10/25.   Subjective:  Patient seen and examined.  Complains of right-sided pain mostly on the back.  Wants to mobilize.  Assessment/Plan: Hemothorax in the setting of acute right 8 to 9 rib fracture and displaced mid sternal body fracture -Underwent chest tube placement by trauma surgery. -Followed by surgery.  Today on waterseal.  Active chest physiotherapy, incentive spirometry and mobility today.   Acute blood loss anemia -Baseline hemoglobin normal, presented with hemoglobin  low at 10.3 and overnight dropped to between 8 and 9, has been stable and there for last few readings.   -Continue to monitor, transfuse if less than 7.   Recent Labs    07/09/21 0250 07/09/21 0401 07/09/21 0633 07/10/21 0413 07/12/21 0948  HGB 8.6* 8.6* 8.5* 7.9* 8.5*  MCV  --   --   --  91.6 94.0   Acute hypoxia secondary to large hemothorax -Patient was maintaining saturation on presentation but dropped to 70s while in the ED and required 2 L oxygen.  Currently on room air.   Recent fall Chronic T10 compression fracture -Patient asymptomatic at this time -Per trauma surgery, compression fracture seems worse in recent scan.  Seen by neurosurgery.  No surgical intervention recommended.    Anxiety/depression - Continue antidepressives, Ativan as needed.  Patient on multiple medications at home including trazodone, Wellbutrin and Remeron that she will continue.  Discontinue telemetry monitor.  Discontinue continuous pulse ox.  Can move to MedSurg bed. Refer to SNF, can be transferred once she is off chest tube.   Mobility: Encourage ambulation.  PT eval Living condition: Homeless Goals of care:   Code Status: Full Code  Nutritional status: Body mass index is 18.85 kg/m.     Diet:  Diet Order             Diet regular Room service appropriate? Yes; Fluid consistency: Thin  Diet effective now                  DVT prophylaxis:  enoxaparin (LOVENOX) injection 30 mg Start: 07/09/21  2200 SCDs Start: 07/09/21 0035   Antimicrobials: None Fluid: None Consultants: Trauma surgery Family Communication: None at bedside  Status is: Inpatient  Remains inpatient appropriate because: Actively managed chest tube with hemothorax.  Dispo: The patient is from: Homeless              Anticipated d/c is to: Skilled nursing facility.              Patient currently is not medically stable to d/c.   Difficult to place patient No     Infusions:    Scheduled Meds:   amphetamine-dextroamphetamine  20 mg Oral BID WC   buPROPion  150 mg Oral QHS   enoxaparin (LOVENOX) injection  30 mg Subcutaneous Q12H   mirtazapine  15 mg Oral QHS   nicotine  14 mg Transdermal Daily   traZODone  300 mg Oral QHS   venlafaxine XR  150 mg Oral Q breakfast    PRN meds: HYDROcodone-acetaminophen, LORazepam, methocarbamol, morphine injection   Antimicrobials: Anti-infectives (From admission, onward)    None       Objective: Vitals:   07/12/21 0608 07/12/21 0741  BP: 121/80 124/61  Pulse: 83 68  Resp: (!) 24 18  Temp:  98.3 F (36.8 C)  SpO2: 98% 94%    Intake/Output Summary (Last 24 hours) at 07/12/2021 1100 Last data filed at 07/12/2021 0854 Gross per 24 hour  Intake 240 ml  Output 100 ml  Net 140 ml   Filed Weights   07/08/21 1958  Weight: 56.2 kg   Weight change:  Body mass index is 18.85 kg/m.   Physical Exam:  General: Anxious but comfortable.  On room air.  Not in any distress. Cardiovascular: Swan S2 normal.  Regular rate rhythm. Respiratory: Mostly clear.  She has decreased air entry on the right side with decreased chest expansion and presence of chest tube.  Minimum pinkish fluid present on the chest tube. Gastrointestinal: Soft and nontender.  Bowel sounds present. Ext: No swelling or edema.  No cyanosis.      Data Review: I have personally reviewed the laboratory data and studies available.  F/u labs ordered Unresulted Labs (From admission, onward)    None       Signed, Dorcas Carrow, MD [facility] 07/12/2021  Total time spent: 30 minutes

## 2021-07-12 NOTE — Care Management Important Message (Signed)
Important Message  Patient Details  Name: [name] MRN: [id number] Date of Birth: 1950-10-28   Medicare Important Message Given:  Yes     [name] Stefan [place] 07/12/2021, 3:59 PM

## 2021-07-12 NOTE — Progress Notes (Signed)
Physical Therapy Treatment Patient Details Name: [name] MRN: [id number] DOB: Jan 18, 1951 Today's Date: 07/12/2021   History of Present Illness 70 yo female presents to [facility] on 10/24 with bilat rib pain x4 days, fall x1 week ago. CT shows R rib 8-9 fx, R hemothorax, sternal fracture, chronic thoracic spine fractures. Chest tube placed 10/25. PMH includes anxiety, OA, asthma, depression, HTN, OP, R shoulder replacement, L hip pinning 2013, L TKR 2021, smoker, previous C spine surgeries. Multiple ED visits.    PT Comments    Pt crying and expressing frustrations, with intermittent periods of pleasantness during session. Pt is very emotional and impulsive, requires frequent safety cues with chest tube/rollator given this. Pt ambulatory for 50 ft with rollator, limited in distance by pain and fatigue. Progressing well with mobility, continuing to recommend SNF level of care post-acutely.    Recommendations for follow up therapy are one component of a multi-disciplinary discharge planning process, led by the attending physician.  Recommendations may be updated based on patient status, additional functional criteria and insurance authorization.  Follow Up Recommendations  Skilled nursing-short term rehab (<3 hours/day)     Assistance Recommended at Discharge Frequent or constant Supervision/Assistance  Equipment Recommendations  None recommended by PT    Recommendations for Other Services       Precautions / Restrictions Precautions Precautions: Fall;Other (comment) Precaution Comments: R pigtail chest tube to water seal Restrictions Other Position/Activity Restrictions: no brace needed for compression fractures per order     Mobility  Bed Mobility Overal bed mobility: Needs Assistance Bed Mobility: Supine to Sit;Sit to Supine     Supine to sit: Min assist;HOB elevated Sit to supine: Min assist;HOB elevated   General bed mobility comments: assist for log roll to/from EOB,  trunk elevation, and LE lifting into bed.    Transfers Overall transfer level: Needs assistance Equipment used: Rollator (4 wheels) Transfers: Sit to/from Stand Sit to Stand: Min assist           General transfer comment: assist to steady, PT locking/unlocking rollator for pt.    Ambulation/Gait Ambulation/Gait assistance: Min assist Gait Distance (Feet): 50 Feet Assistive device: Rollator (4 wheels) Gait Pattern/deviations: Step-through pattern;Decreased stride length;Trunk flexed;Narrow base of support Gait velocity: decr   General Gait Details: light steadying assist, cues for upright posture and keeping rollator close to person. Pt suddenly states "I need to sit" but when PT provides chair pt refuses and keeps walking and walks faster/unsafely to reach bed.   Stairs             Wheelchair Mobility    Modified Rankin (Stroke Patients Only)       Balance Overall balance assessment: Needs assistance;History of Falls Sitting-balance support: No upper extremity supported Sitting balance-Leahy Scale: Fair     Standing balance support: Bilateral upper extremity supported;During functional activity Standing balance-Leahy Scale: Poor Standing balance comment: reliant on external assist                            Cognition Arousal/Alertness: Awake/alert Behavior During Therapy: Restless;Anxious;Impulsive Overall Cognitive Status: Impaired/Different from baseline Area of Impairment: Safety/judgement;Problem solving;Memory;Attention;Awareness                   Current Attention Level: Selective Memory: Decreased short-term memory   Safety/Judgement: Decreased awareness of deficits;Decreased awareness of safety Awareness: Emergent Problem Solving: Requires verbal cues;Requires tactile cues General Comments: pt intermittently tearful during session, also raises her voice  at PT at several instances and appears impatient, then quickly becomes  polite again. Pt lacks safety awareness with rollator and chest tube, impuslively starts pushng rollator away from self with chest tube connected to it requiring PT cuing and physically maneuvering rollator to correct.        Exercises      General Comments        Pertinent Vitals/Pain Pain Assessment: Faces Faces Pain Scale: Hurts even more Pain Location: chest wall, R ribs Pain Descriptors / Indicators: Sore;Discomfort;Grimacing;Guarding Pain Intervention(s): Limited activity within patient's tolerance;Monitored during session;Repositioned    Home Living                          Prior Function            PT Goals (current goals can now be found in the care plan section) Acute Rehab PT Goals PT Goal Formulation: With patient Time For Goal Achievement: 07/24/21 Potential to Achieve Goals: Good Progress towards PT goals: Progressing toward goals    Frequency    Min 3X/week      PT Plan Current plan remains appropriate    Co-evaluation              AM-PAC PT "6 Clicks" Mobility   Outcome Measure  Help needed turning from your back to your side while in a flat bed without using bedrails?: A Little Help needed moving from lying on your back to sitting on the side of a flat bed without using bedrails?: A Little Help needed moving to and from a bed to a chair (including a wheelchair)?: A Little Help needed standing up from a chair using your arms (e.g., wheelchair or bedside chair)?: A Little Help needed to walk in hospital room?: A Little Help needed climbing 3-5 steps with a railing? : A Lot 6 Click Score: 17    End of Session Equipment Utilized During Treatment: Other (comment) Activity Tolerance: Patient limited by fatigue;Patient limited by pain Patient left: in bed;with call bell/phone within reach;with bed alarm set Nurse Communication: Mobility status PT Visit Diagnosis: Other abnormalities of gait and mobility (R26.89);Muscle weakness  (generalized) (M62.81)     Time: 8413-2440 PT Time Calculation (min) (ACUTE ONLY): 19 min  Charges:  $Gait Training: 8-22 mins                     Marye Round, PT DPT Acute Rehabilitation Services Pager [phone]  Office [phone]    Tyrone Apple E Christain [place] 07/12/2021, 4:12 PM

## 2021-07-12 NOTE — Progress Notes (Signed)
Subjective: CC: Patient notes burning at chest tube site. Rib fracture pain well controlled on current medications. No sob. Taken off o2 this am. Worked with OT yesterday and used rollator to mobilize and complete ADL tasks per note w/ o2 91-93 on RA w/o sob. Tolerating diet without n/v. Passing flatus. Last BM 10/23. Reports she usually has to take miralax to help have a bm. She is voiding without difficulty. Pulling 375 on IS.   Objective: Vital signs in last 24 hours: Temp:  [98.3 F (36.8 C)-99.6 F (37.6 C)] 98.3 F (36.8 C) (10/28 0741) Pulse Rate:  [68-96] 68 (10/28 0741) Resp:  [16-25] 18 (10/28 0741) BP: (111-134)/(52-80) 124/61 (10/28 0741) SpO2:  [91 %-98 %] 94 % (10/28 0741) Last BM Date: 07/07/21  Intake/Output from previous day: 10/27 0701 - 10/28 0700 In: 480 [P.O.:480] Out: 50 [Chest Tube:50] Intake/Output this shift: Total I/O In: -  Out: 50 [Chest Tube:50]  PE: Gen:  Alert, NAD, pleasant HEENT: EOM's intact, pupils equal and round Card:  RRR Pulm:  CTAB, no W/R/R, effort normal. Off o2. R chest tube site c/d/I. R chest tube on WS. 1350cc bloody fluid in sahara after draining fluid in tubing, 100cc/24 hours. No air leak. 375 on IS Abd: Soft, ND, NT +BS Ext: No LE edema or calf tenderness. MAE's Psych: A&Ox3  Skin: no rashes noted, warm and dry  Lab Results:  Recent Labs    07/10/21 0413  WBC 11.7*  HGB 7.9*  HCT 23.9*  PLT 405*   BMET Recent Labs    07/10/21 0413  NA 136  K 3.8  CL 109  CO2 19*  GLUCOSE 119*  BUN 19  CREATININE 0.62  CALCIUM 8.3*   PT/INR No results for input(s): LABPROT, INR in the last 72 hours. CMP     Component Value Date/Time   NA 136 07/10/2021 0413   K 3.8 07/10/2021 0413   CL 109 07/10/2021 0413   CO2 19 (L) 07/10/2021 0413   GLUCOSE 119 (H) 07/10/2021 0413   BUN 19 07/10/2021 0413   CREATININE 0.62 07/10/2021 0413   CALCIUM 8.3 (L) 07/10/2021 0413   PROT 5.6 (L) 07/08/2021 1653   ALBUMIN 3.0  (L) 07/08/2021 1653   AST 17 07/08/2021 1653   ALT 9 07/08/2021 1653   ALKPHOS 73 07/08/2021 1653   BILITOT 0.2 (L) 07/08/2021 1653   GFRNONAA >60 07/10/2021 0413   GFRAA 45 (L) 03/27/2020 0855   Lipase     Component Value Date/Time   LIPASE 25 07/08/2021 1653    Studies/Results: DG CHEST PORT 1 VIEW  Result Date: 07/11/2021 CLINICAL DATA:  Hemothorax EXAM: PORTABLE CHEST 1 VIEW COMPARISON:  July 10, 2021 FINDINGS: The cardiomediastinal silhouette is unchanged in contour.RIGHT-sided chest tube. Small RIGHT pleural effusion. No significant pneumothorax. Scattered bibasilar atelectasis. Visualized abdomen is unremarkable. Calcified breast implants. Status post RIGHT shoulder arthroplasty. Status post ACDF and posterior fixation of the cervical spine. Multilevel degenerative changes of the thoracolumbar spine with revisualization of wedging near the thoracolumbar junction. IMPRESSION: Scattered bibasilar atelectasis a small RIGHT pleural effusion. No significant pneumothorax. Electronically Signed   By: Meda Klinefelter M.D.   On: 07/11/2021 08:06    Anti-infectives: Anti-infectives (From admission, onward)    None        Assessment/Plan GLF  R rib fx 8-9 - pain control, IS/pulm toilet R HTX - CXR w/ continued small R pleural effusion and output 100/24 hours. Reviewed with MD. Plan  to continue today. On WS. Repeat CXR this AM pending. Monitor output Sternal fx - pain control, pulm toilet Worsened T10 compression fx - NSGY c/s, Dr. Jordan Likes. No brace needed ABL anemia - cbc pending this am FEN - Reg DVT - lovenox 30mg  BID  ID - none Dispo - therapies. SNF   LOS: 3 days    , [facility] 07/12/2021, 8:55 AM Please see Amion for pager number during day hours 7:00am-4:30pm

## 2021-07-13 ENCOUNTER — Inpatient Hospital Stay (HOSPITAL_COMMUNITY): Payer: Medicare Other

## 2021-07-13 DIAGNOSIS — J942 Hemothorax: Secondary | ICD-10-CM | POA: Diagnosis not present

## 2021-07-13 NOTE — Progress Notes (Signed)
       Subjective: CC: Patient notes burning at chest tube site, but otherwise no new complaints.  160cc of out chest tube yesterday.   Objective: Vital signs in last 24 hours: Temp:  [98.4 F (36.9 C)-99.5 F (37.5 C)] 99.5 F (37.5 C) (10/29 0802) Pulse Rate:  [74-85] 85 (10/29 0802) Resp:  [16-20] 18 (10/29 0802) BP: (118-139)/(51-72) 130/63 (10/29 0802) SpO2:  [91 %-95 %] 91 % (10/29 0802) Last BM Date: 07/07/21  Intake/Output from previous day: 10/28 0701 - 10/29 0700 In: -  Out: 190 [Chest Tube:190] Intake/Output this shift: No intake/output data recorded.  PE: Gen:  Alert, NAD, pleasant HEENT: EOM's intact, pupils equal and round Card:  RRR Pulm:  CTAB, no W/R/R, effort normal. Off o2. R chest tube site c/d/I. R chest tube on WS. 150cc of output in last 24 hrs.  Output is SS.  No airleak Abd: Soft, ND, NT +BS Ext: No LE edema or calf tenderness. MAE's Psych: A&Ox3  Skin: no rashes noted, warm and dry  Lab Results:  Recent Labs    07/12/21 0948  WBC 12.2*  HGB 8.5*  HCT 26.8*  PLT 529*   BMET No results for input(s): NA, K, CL, CO2, GLUCOSE, BUN, CREATININE, CALCIUM in the last 72 hours.  PT/INR No results for input(s): LABPROT, INR in the last 72 hours. CMP     Component Value Date/Time   NA 136 07/10/2021 0413   K 3.8 07/10/2021 0413   CL 109 07/10/2021 0413   CO2 19 (L) 07/10/2021 0413   GLUCOSE 119 (H) 07/10/2021 0413   BUN 19 07/10/2021 0413   CREATININE 0.62 07/10/2021 0413   CALCIUM 8.3 (L) 07/10/2021 0413   PROT 5.6 (L) 07/08/2021 1653   ALBUMIN 3.0 (L) 07/08/2021 1653   AST 17 07/08/2021 1653   ALT 9 07/08/2021 1653   ALKPHOS 73 07/08/2021 1653   BILITOT 0.2 (L) 07/08/2021 1653   GFRNONAA >60 07/10/2021 0413   GFRAA 45 (L) 03/27/2020 0855   Lipase     Component Value Date/Time   LIPASE 25 07/08/2021 1653    Studies/Results: DG CHEST PORT 1 VIEW  Result Date: 07/12/2021 CLINICAL DATA:  Hemothorax EXAM: PORTABLE CHEST 1 VIEW  COMPARISON:  07/11/2021 FINDINGS: Pleural drainage catheter remains positioned at the right lung apex. Stable heart size. Increasing right-sided pleural effusion which appears loculated laterally. Hazy right basilar opacity. Prominent interstitial markings bilaterally. No pneumothorax is seen. IMPRESSION: Slightly increasing right-sided pleural effusion with right chest tube in place. No pneumothorax. Electronically Signed   By: Duanne Guess D.O.   On: 07/12/2021 09:03    Anti-infectives: Anti-infectives (From admission, onward)    None        Assessment/Plan GLF  R rib fx 8-9 - pain control, IS/pulm toilet R HTX - CXR w/ continued small R pleural effusion and output 150/24 hrs.  CXR not much change at the base.  Continue tube to waterseal again today and follow output and CXR Sternal fx - pain control, pulm toilet Worsened T10 compression fx - NSGY c/s, Dr. Jordan Likes. No brace needed ABL anemia - hgb stable FEN - Reg DVT - lovenox 30mg  BID  ID - none Dispo - therapies. SNF   LOS: 4 days    , [facility] 07/13/2021, 9:55 AM Please see Amion for pager number during day hours 7:00am-4:30pm

## 2021-07-13 NOTE — Progress Notes (Signed)
PROGRESS NOTE  [name]  DOB: 09/25/1950  PCP: Paulino Door, MD JJO:[id number]  DOA: 07/08/2021  LOS: 4 days  Hospital Day: 6  Chief Complaint  Patient presents with   Chest Pain    Brief narrative: [name] is a 70 y.o. female with PMH significant for HTN, HLD, chronic smoking, mitral valve prolapse, palpitation, anxiety/depression, asthma, chronic pain, osteopenia, osteoporosis who has been homeless for the last year and a half and has been moving between different shelters in peoples homes. Patient presented to the ED on 10/24 with complaint of right anterior rib pain on deep inspiration ongoing for 4 to 5 days.  In the ED, she was was afebrile, normotensive, initially maintaining oxygen saturation but while in the ED saturation dropped to 70s and she was placed on 2 L oxygen by nasal cannula.  On exam, she was noted to have numerous bruises on forehead and upper extremity but she could not recall what happened.  She also was noted to have difficulty with memory. Labs showed D-dimer elevated 1.56 CTA of the chest showed right eighth and ninth breath fracture, displaced mid sternal body fracture with large right-sided hemothorax.  Hemoglobin of 10.3 which down from 13.5 in September.   Patient had presented to the emergency room on 10/15 with injuries, chest x-rays were without definite evidence of fracture or complications so she was discharged from ER. Chest tube was placed by surgery on 10/25.   Subjective:  Patient seen and examined.  No overnight events.  Some burning pain on the chest tube site otherwise denies any complaints.  Assessment/Plan: Hemothorax in the setting of acute right 8 to 9 rib fracture and displaced mid sternal body fracture -Currently with chest tube placement.  Continues to be on waterseal. -Followed by surgery.  Continue aggressive chest physiotherapy.   Acute blood loss anemia -expected blood loss from hemothorax.  Hemoglobin remains a  stable at 8.5.  No indication for transfusion. -Continue to monitor, transfuse if less than 7.   Recent Labs    07/09/21 0250 07/09/21 0401 07/09/21 0633 07/10/21 0413 07/12/21 0948  HGB 8.6* 8.6* 8.5* 7.9* 8.5*  MCV  --   --   --  91.6 94.0   Acute hypoxia secondary to large hemothorax -Resolved.  Now on room air.   Recent fall Chronic T10 compression fracture -Patient asymptomatic at this time.  She has chronic pain. -Per trauma surgery, compression fracture seems worse in recent scan.  Seen by neurosurgery.  No surgical intervention recommended.    Anxiety/depression - Continue antidepressives, Ativan as needed.  Patient on multiple medications at home including trazodone, Wellbutrin and Remeron that she will continue.  Discontinue telemetry monitor.  Discontinue continuous pulse ox.  Can move to MedSurg bed. Refer to SNF, she can be transferred to SNF when chest tube can come out.   Mobility: Encourage ambulation.  PT eval Living condition: Homeless Goals of care:   Code Status: Full Code  Nutritional status: Body mass index is 18.85 kg/m.     Diet:  Diet Order             Diet regular Room service appropriate? Yes; Fluid consistency: Thin  Diet effective now                  DVT prophylaxis:  enoxaparin (LOVENOX) injection 30 mg Start: 07/09/21 2200 SCDs Start: 07/09/21 0035   Antimicrobials: None Fluid: None Consultants: Trauma surgery, neurosurgery. Family Communication: None.  Status is: Inpatient  Remains inpatient appropriate because: Actively managed chest tube with hemothorax.  Dispo: The patient is from: Homeless              Anticipated d/c is to: Skilled nursing facility.              Patient currently is not medically stable to d/c.  She still has a actively draining chest tube.   Difficult to place patient No     Infusions:    Scheduled Meds:  amphetamine-dextroamphetamine  20 mg Oral BID WC   buPROPion  150 mg Oral QHS    enoxaparin (LOVENOX) injection  30 mg Subcutaneous Q12H   mirtazapine  15 mg Oral QHS   nicotine  14 mg Transdermal Daily   traZODone  300 mg Oral QHS   venlafaxine XR  150 mg Oral Q breakfast    PRN meds: HYDROcodone-acetaminophen, LORazepam, methocarbamol, morphine injection   Antimicrobials: Anti-infectives (From admission, onward)    None       Objective: Vitals:   07/13/21 0802 07/13/21 1113  BP: 130/63 117/66  Pulse: 85 91  Resp: 18 18  Temp: 99.5 F (37.5 C) 98.5 F (36.9 C)  SpO2: 91% 92%    Intake/Output Summary (Last 24 hours) at 07/13/2021 1121 Last data filed at 07/13/2021 0700 Gross per 24 hour  Intake --  Output 140 ml  Net -140 ml   Filed Weights   07/08/21 1958  Weight: 56.2 kg   Weight change:  Body mass index is 18.85 kg/m.   Physical Exam:  General: Looks comfortable.  On room air.  Not in any distress. Cardiovascular: S1 S2 normal.  Regular rate rhythm. Respiratory: Mostly clear.  She has decreased air entry on the right side with decreased chest expansion and presence of chest tube.  Minimum pinkish fluid present on the chest tube. Chest tube on waterseal.  No air leak.  Noted output 150 mL last 24 hours. Gastrointestinal: Soft and nontender.  Bowel sounds present. Ext: No swelling or edema.  No cyanosis.      Data Review: I have personally reviewed the laboratory data and studies available.  F/u labs ordered Unresulted Labs (From admission, onward)    None       Signed, Dorcas Carrow, MD [facility] 07/13/2021  Total time spent: 30 minutes

## 2021-07-14 ENCOUNTER — Inpatient Hospital Stay (HOSPITAL_COMMUNITY): Payer: Medicare Other

## 2021-07-14 NOTE — Progress Notes (Signed)
Acute PROGRESS NOTE  [name]  DOB: 1951/01/12  PCP: Paulino Door, MD ZCH:[id number]  DOA: 07/08/2021  LOS: 5 days  Hospital Day: 7  Chief Complaint  Patient presents with   Chest Pain    Brief narrative: [name] is a 70 y.o. female with PMH significant for HTN, HLD, chronic smoking, mitral valve prolapse, palpitation, anxiety/depression, asthma, chronic pain, osteopenia, osteoporosis who has been homeless for the last year and a half and has been moving between different shelters in peoples homes. Patient presented to the ED on 10/24 with complaint of right anterior rib pain on deep inspiration ongoing for 4 to 5 days.  In the ED, she was was afebrile, normotensive, initially maintaining oxygen saturation but while in the ED saturation dropped to 70s and she was placed on 2 L oxygen by nasal cannula.  On exam, she was noted to have numerous bruises on forehead and upper extremity but she could not recall what happened.  She also was noted to have difficulty with memory. Labs showed D-dimer elevated 1.56 CTA of the chest showed right eighth and ninth breath fracture, displaced mid sternal body fracture with large right-sided hemothorax.  Hemoglobin of 10.3 which down from 13.5 in September.   Patient had presented to the emergency room on 10/15 with injuries, chest x-rays were without definite evidence of fracture or complications so she was discharged from ER. Chest tube was placed by surgery on 10/25.   Subjective:  Patient seen and examined.  No overnight events.  Anxiety episode and emotional outburst after talking to the surgeon who said that she may need surgery.  Assessment/Plan: Hemothorax in the setting of acute right 8 to 9 rib fracture and displaced mid sternal body fracture -Currently with chest tube placement.  Continues to be on waterseal. -Followed by surgery.  Continue aggressive chest physiotherapy. -Repeat chest x-ray with persistent loculated  collection, chest tube draining 150 mL overnight.  Chest tube management as per surgery.   Acute blood loss anemia -expected blood loss from hemothorax.  Hemoglobin remains a stable at 8.5.  No indication for transfusion. -Continue to monitor, transfuse if less than 7.  Recheck tomorrow morning. Recent Labs    07/09/21 0250 07/09/21 0401 07/09/21 0633 07/10/21 0413 07/12/21 0948  HGB 8.6* 8.6* 8.5* 7.9* 8.5*  MCV  --   --   --  91.6 94.0   Acute hypoxia secondary to large hemothorax -Resolved.  Now on room air.   Recent fall Chronic T10 compression fracture -Patient asymptomatic at this time.  She has chronic pain. -Per trauma surgery, compression fracture seems worse in recent scan.  Seen by neurosurgery.  No surgical intervention recommended.    Anxiety/depression - Continue antidepressives, Ativan as needed.  Patient on multiple medications at home including trazodone, Wellbutrin and Remeron that she will continue.  Discontinue telemetry monitor.  Discontinue continuous pulse ox.  Can move to MedSurg bed. Refer to SNF, she can be transferred to SNF when chest tube can come out.   Mobility: Encourage ambulation.  PT eval Living condition: Homeless Goals of care:   Code Status: Full Code  Nutritional status: Body mass index is 18.85 kg/m.     Diet:  Diet Order             Diet regular Room service appropriate? Yes; Fluid consistency: Thin  Diet effective now                  DVT prophylaxis:  enoxaparin (  LOVENOX) injection 30 mg Start: 07/09/21 2200 SCDs Start: 07/09/21 0035   Antimicrobials: None Fluid: None Consultants: Trauma surgery, neurosurgery. Family Communication: None.  Status is: Inpatient  Remains inpatient appropriate because: Actively managed chest tube with hemothorax.  Dispo: The patient is from: Homeless              Anticipated d/c is to: Skilled nursing facility.              Patient currently is not medically stable to d/c.  She  still has a actively draining chest tube.   Difficult to place patient No     Infusions:    Scheduled Meds:  amphetamine-dextroamphetamine  20 mg Oral BID WC   buPROPion  150 mg Oral QHS   enoxaparin (LOVENOX) injection  30 mg Subcutaneous Q12H   mirtazapine  15 mg Oral QHS   nicotine  14 mg Transdermal Daily   traZODone  300 mg Oral QHS   venlafaxine XR  150 mg Oral Q breakfast    PRN meds: HYDROcodone-acetaminophen, LORazepam, methocarbamol, morphine injection   Antimicrobials: Anti-infectives (From admission, onward)    None       Objective: Vitals:   07/14/21 0340 07/14/21 0817  BP: 115/68 121/79  Pulse: 84 78  Resp: 15   Temp: 98.7 F (37.1 C) 98.8 F (37.1 C)  SpO2: 91% 90%    Intake/Output Summary (Last 24 hours) at 07/14/2021 1139 Last data filed at 07/14/2021 0500 Gross per 24 hour  Intake 480 ml  Output 100 ml  Net 380 ml   Filed Weights   07/08/21 1958  Weight: 56.2 kg   Weight change:  Body mass index is 18.85 kg/m.   Physical Exam:  General: Looks comfortable on room air.  Anxious.  Emotional and crying. Cardiovascular: S1 S2 normal.  Regular rate rhythm. Respiratory: Mostly clear.  She has decreased air entry on the right side with decreased chest expansion and presence of chest tube.  Minimum pinkish fluid present on the chest tube. Chest tube on waterseal.  No air leak.  Noted output 150 mL last 24 hours. Gastrointestinal: Soft and nontender.  Bowel sounds present. Ext: No swelling or edema.  No cyanosis.      Data Review: I have personally reviewed the laboratory data and studies available.  F/u labs ordered Unresulted Labs (From admission, onward)    None       Signed, Dorcas Carrow, MD [facility] 07/14/2021  Total time spent: 30 minutes

## 2021-07-14 NOTE — Progress Notes (Signed)
       Subjective: CC: No new complaints.  Denies SOB  Objective: Vital signs in last 24 hours: Temp:  [98.5 F (36.9 C)-99.6 F (37.6 C)] 98.8 F (37.1 C) (10/30 0817) Pulse Rate:  [70-106] 78 (10/30 0817) Resp:  [15-20] 15 (10/30 0340) BP: (115-139)/(57-82) 121/79 (10/30 0817) SpO2:  [90 %-95 %] 90 % (10/30 0817) Last BM Date: 07/07/21  Intake/Output from previous day: 10/29 0701 - 10/30 0700 In: 480 [P.O.:480] Out: 100 [Chest Tube:100] Intake/Output this shift: No intake/output data recorded.  PE: Gen:  Alert, NAD, pleasant Card:  RRR Pulm:  CTAB, no W/R/R, effort normal. Off o2. R chest tube site c/d/I. R chest tube on WS. 130cc of output in last 24 hrs.  Output is SS.  No airleak Abd: Soft, ND, NT +BS Psych: A&Ox3    Lab Results:  Recent Labs    07/12/21 0948  WBC 12.2*  HGB 8.5*  HCT 26.8*  PLT 529*   BMET No results for input(s): NA, K, CL, CO2, GLUCOSE, BUN, CREATININE, CALCIUM in the last 72 hours.  PT/INR No results for input(s): LABPROT, INR in the last 72 hours. CMP     Component Value Date/Time   NA 136 07/10/2021 0413   K 3.8 07/10/2021 0413   CL 109 07/10/2021 0413   CO2 19 (L) 07/10/2021 0413   GLUCOSE 119 (H) 07/10/2021 0413   BUN 19 07/10/2021 0413   CREATININE 0.62 07/10/2021 0413   CALCIUM 8.3 (L) 07/10/2021 0413   PROT 5.6 (L) 07/08/2021 1653   ALBUMIN 3.0 (L) 07/08/2021 1653   AST 17 07/08/2021 1653   ALT 9 07/08/2021 1653   ALKPHOS 73 07/08/2021 1653   BILITOT 0.2 (L) 07/08/2021 1653   GFRNONAA >60 07/10/2021 0413   GFRAA 45 (L) 03/27/2020 0855   Lipase     Component Value Date/Time   LIPASE 25 07/08/2021 1653    Studies/Results: DG CHEST PORT 1 VIEW  Result Date: 07/13/2021 CLINICAL DATA:  Shortness of breath. EXAM: PORTABLE CHEST 1 VIEW COMPARISON:  07/12/2021 prior studies FINDINGS: The cardiomediastinal silhouette is unchanged. RIGHT thoracostomy catheter again noted with relatively unchanged RIGHT pleural  effusion and RIGHT LOWER lung atelectasis/consolidation. Pulmonary vascular congestion and mild bilateral interstitial opacities are again noted as well as mild LEFT basilar atelectasis. There is no evidence of pneumothorax. No significant changes are identified. IMPRESSION: Unchanged chest radiograph with RIGHT thoracostomy catheter, RIGHT pleural effusion and RIGHT LOWER lung atelectasis/consolidation. Electronically Signed   By: Harmon Pier M.D.   On: 07/13/2021 11:36    Anti-infectives: Anti-infectives (From admission, onward)    None        Assessment/Plan GLF  R rib fx 8-9 - pain control, IS/pulm toilet R HTX - CXR w/ continued small R pleural effusion and output 130/24 hrs.  CXR with what appears to be a loculated collection vs consolidation at the base.  Likely may need CT chest tomorrow to better evaluate but will d/w MD Sternal fx - pain control, pulm toilet Worsened T10 compression fx - NSGY c/s, Dr. Jordan Likes. No brace needed ABL anemia - hgb stable FEN - Reg DVT - lovenox 30mg  BID  ID - none Dispo - therapies. SNF   LOS: 5 days    , [facility] 07/14/2021, 10:27 AM Please see Amion for pager number during day hours 7:00am-4:30pm

## 2021-07-15 ENCOUNTER — Inpatient Hospital Stay (HOSPITAL_COMMUNITY): Payer: Medicare Other

## 2021-07-15 MED ORDER — POLYETHYLENE GLYCOL 3350 17 G PO PACK
17.0000 g | PACK | Freq: Every day | ORAL | Status: DC
  Administered 2021-07-15 – 2021-07-18 (×4): 17 g via ORAL
  Filled 2021-07-15 (×6): qty 1

## 2021-07-15 MED ORDER — IPRATROPIUM-ALBUTEROL 0.5-2.5 (3) MG/3ML IN SOLN: 3.0000 mL | Freq: Four times a day (QID) | RESPIRATORY_TRACT | Status: DC | PRN

## 2021-07-15 MED ORDER — OXYCODONE HCL 5 MG PO TABS
5.0000 mg | ORAL_TABLET | ORAL | Status: DC | PRN
Start: 2021-07-15 — End: 2021-07-20
  Administered 2021-07-15 – 2021-07-19 (×13): 5 mg via ORAL
  Filled 2021-07-15 (×15): qty 1

## 2021-07-15 MED ORDER — DOCUSATE SODIUM 100 MG PO CAPS
100.0000 mg | ORAL_CAPSULE | Freq: Two times a day (BID) | ORAL | Status: DC
  Administered 2021-07-15 – 2021-07-19 (×10): 100 mg via ORAL
  Filled 2021-07-15 (×10): qty 1

## 2021-07-15 MED ORDER — ACETAMINOPHEN 500 MG PO TABS
1000.0000 mg | ORAL_TABLET | Freq: Four times a day (QID) | ORAL | Status: DC | PRN
  Administered 2021-07-15 – 2021-07-19 (×5): 1000 mg via ORAL
  Filled 2021-07-15 (×7): qty 2

## 2021-07-15 MED ORDER — IPRATROPIUM-ALBUTEROL 0.5-2.5 (3) MG/3ML IN SOLN
3.0000 mL | Freq: Once | RESPIRATORY_TRACT | Status: AC
  Administered 2021-07-15: 3 mL via RESPIRATORY_TRACT
  Filled 2021-07-15: qty 3

## 2021-07-15 NOTE — Progress Notes (Addendum)
Physical Therapy Treatment Patient Details Name: [name] MRN: [id number] DOB: [birth year] Today's Date: 07/15/2021   History of Present Illness 70 yo female presents to [facility] on 10/24 with bilat rib pain x4 days, fall x1 week ago. CT shows R rib 8-9 fx, R hemothorax, sternal fracture, chronic thoracic spine fractures. Chest tube placed 10/25. PMH includes anxiety, OA, asthma, depression, HTN, OP, R shoulder replacement, L hip pinning 2013, L TKR 2021, smoker, previous C spine surgeries. Multiple ED visits.    PT Comments    Pt tearful upon entry, stating she was sad and all alone. PT offered emotional support and reinforcement of the progress she was making with therapy. Pt agreeable with encouragement to sit up on the side of the bed, but ultimately refused to stand or ambulate, returning herself to bed. Pt raising her voice at the therapist, stating, "I can't walk, I'm sick to my stomach." Offered to ask her RN if she could have nausea meds, but pt refused. Pt agreed to ginger ale. Will continue to progress mobility as tolerated.     Recommendations for follow up therapy are one component of a multi-disciplinary discharge planning process, led by the attending physician.  Recommendations may be updated based on patient status, additional functional criteria and insurance authorization.  Follow Up Recommendations  Skilled nursing-short term rehab (<3 hours/day)     Assistance Recommended at Discharge Frequent or constant Supervision/Assistance  Equipment Recommendations  None recommended by PT    Recommendations for Other Services       Precautions / Restrictions Precautions Precautions: Fall;Other (comment) Precaution Comments: R pigtail chest tube to water seal Restrictions Weight Bearing Restrictions: No     Mobility  Bed Mobility Overal bed mobility: Needs Assistance Bed Mobility: Supine to Sit;Sit to Supine     Supine to sit: Min assist Sit to supine: Min guard    General bed mobility comments: MinA at trunk to progress to upright, min guard for return to bed    Transfers                   General transfer comment: pt refusing    Ambulation/Gait                 Stairs             Wheelchair Mobility    Modified Rankin (Stroke Patients Only)       Balance Overall balance assessment: Needs assistance Sitting-balance support: Feet supported Sitting balance-Leahy Scale: Fair                                      Cognition Arousal/Alertness: Awake/alert Behavior During Therapy: Restless;Anxious;Impulsive Overall Cognitive Status: Impaired/Different from baseline Area of Impairment: Safety/judgement;Problem solving;Memory;Attention;Awareness                   Current Attention Level: Selective Memory: Decreased short-term memory   Safety/Judgement: Decreased awareness of deficits;Decreased awareness of safety Awareness: Emergent Problem Solving: Requires verbal cues;Requires tactile cues General Comments: pt intermittently tearful throughout session        Exercises      General Comments        Pertinent Vitals/Pain Pain Assessment: Faces Faces Pain Scale: Hurts even more Pain Location: chest wall, R ribs Pain Descriptors / Indicators: Sore;Discomfort;Grimacing;Guarding Pain Intervention(s): Monitored during session    Home Living  Prior Function            PT Goals (current goals can now be found in the care plan section) Acute Rehab PT Goals Patient Stated Goal: have support Potential to Achieve Goals: Good Progress towards PT goals: Progressing toward goals    Frequency    Min 3X/week      PT Plan Current plan remains appropriate    Co-evaluation              AM-PAC PT "6 Clicks" Mobility   Outcome Measure  Help needed turning from your back to your side while in a flat bed without using bedrails?: A  Little Help needed moving from lying on your back to sitting on the side of a flat bed without using bedrails?: A Little Help needed moving to and from a bed to a chair (including a wheelchair)?: A Little Help needed standing up from a chair using your arms (e.g., wheelchair or bedside chair)?: A Little Help needed to walk in hospital room?: A Little Help needed climbing 3-5 steps with a railing? : A Lot 6 Click Score: 17    End of Session   Activity Tolerance: Other (comment) (self limited by pt) Patient left: in bed;with call bell/phone within reach;with bed alarm set Nurse Communication: Mobility status PT Visit Diagnosis: Other abnormalities of gait and mobility (R26.89);Muscle weakness (generalized) (M62.81)     Time: 2706-2376 PT Time Calculation (min) (ACUTE ONLY): 18 min  Charges:  $Therapeutic Activity: 8-22 mins                     Lillia Pauls, PT, DPT Acute Rehabilitation Services Pager [phone] Office [phone]    [name] 07/15/2021, 12:42 PM

## 2021-07-15 NOTE — Progress Notes (Signed)
Acute PROGRESS NOTE  [name]  DOB: 13-Feb-1951  PCP: Paulino Door, MD QJF:[id number]  DOA: 07/08/2021  LOS: 6 days  Hospital Day: 8  Chief Complaint  Patient presents with   Chest Pain    Brief narrative: [name] is a 70 y.o. female with PMH significant for HTN, HLD, chronic smoking, mitral valve prolapse, palpitation, anxiety/depression, asthma, chronic pain, osteopenia, osteoporosis who has been homeless for the last year and a half and has been moving between different shelters in peoples homes. Patient presented to the ED on 10/24 with complaint of right anterior rib pain on deep inspiration ongoing for 4 to 5 days.  In the ED, she was was afebrile, normotensive, initially maintaining oxygen saturation but while in the ED saturation dropped to 70s and she was placed on 2 L oxygen by nasal cannula.  On exam, she was noted to have numerous bruises on forehead and upper extremity but she could not recall what happened.  She also was noted to have difficulty with memory. Labs showed D-dimer elevated 1.56 CTA of the chest showed right eighth and ninth breath fracture, displaced mid sternal body fracture with large right-sided hemothorax.  Hemoglobin of 10.3 which down from 13.5 in September.   Patient had presented to the emergency room on 10/15 with injuries, chest x-rays were without definite evidence of fracture or complications so she was discharged from ER. Chest tube was placed by surgery on 10/25.   Subjective:  Seen and examined.  Some pain remains on the right side.  Emotional and anxious.  On room air.  Assessment/Plan: Hemothorax in the setting of acute right 8 to 9 rib fracture and displaced mid sternal body fracture -Currently with chest tube placement.  Continues to be on waterseal. -Followed by surgery.  Continue aggressive chest physiotherapy. -CT chest today.  May need VATS or revision of chest tube.   Acute blood loss anemia -expected blood loss from  hemothorax.  Hemoglobin remains a stable at 8.5.  No indication for transfusion. -Continue to monitor, transfuse if less than 7.  Recheck hemoglobin. Recent Labs    07/09/21 0250 07/09/21 0401 07/09/21 0633 07/10/21 0413 07/12/21 0948  HGB 8.6* 8.6* 8.5* 7.9* 8.5*  MCV  --   --   --  91.6 94.0   Acute hypoxia secondary to large hemothorax -Resolved.  Now on room air.   Recent fall Chronic T10 compression fracture -Patient asymptomatic at this time.  She has chronic pain. -Per trauma surgery, compression fracture seems worse in recent scan.  Seen by neurosurgery.  No surgical intervention recommended.    Anxiety/depression - Continue antidepressives, Ativan as needed.  Patient on multiple medications at home including trazodone, Wellbutrin and Remeron that she will continue.  Discontinue telemetry monitor.  Discontinue continuous pulse ox.  Can move to MedSurg bed. Refer to SNF, she can be transferred to SNF when chest tube can come out.   Mobility: Encourage ambulation.  PT eval Living condition: Homeless Goals of care:   Code Status: Full Code  Nutritional status: Body mass index is 18.85 kg/m.     Diet:  Diet Order             Diet NPO time specified  Diet effective now                  DVT prophylaxis:  enoxaparin (LOVENOX) injection 30 mg Start: 07/09/21 2200 SCDs Start: 07/09/21 0035   Antimicrobials: None Fluid: None Consultants: Trauma surgery, neurosurgery.  Family Communication: None.  Status is: Inpatient  Remains inpatient appropriate because: Actively managed chest tube with hemothorax.  Dispo: The patient is from: Homeless              Anticipated d/c is to: Skilled nursing facility.              Patient currently is not medically stable to d/c.  She still has a actively draining chest tube.   Difficult to place patient No     Infusions:    Scheduled Meds:  amphetamine-dextroamphetamine  20 mg Oral BID WC   buPROPion  150 mg Oral QHS    docusate sodium  100 mg Oral BID   enoxaparin (LOVENOX) injection  30 mg Subcutaneous Q12H   mirtazapine  15 mg Oral QHS   nicotine  14 mg Transdermal Daily   polyethylene glycol  17 g Oral Daily   traZODone  300 mg Oral QHS   venlafaxine XR  150 mg Oral Q breakfast    PRN meds: acetaminophen, ipratropium-albuterol, LORazepam, methocarbamol, morphine injection, oxyCODONE   Antimicrobials: Anti-infectives (From admission, onward)    None       Objective: Vitals:   07/15/21 1117 07/15/21 1156  BP: (!) 115/48   Pulse:    Resp:    Temp: 98.1 F (36.7 C)   SpO2: 92% 94%    Intake/Output Summary (Last 24 hours) at 07/15/2021 1247 Last data filed at 07/15/2021 0600 Gross per 24 hour  Intake --  Output 150 ml  Net -150 ml   Filed Weights   07/08/21 1958  Weight: 56.2 kg   Weight change:  Body mass index is 18.85 kg/m.   Physical Exam:  General: Comfortable at rest.  On room air.  Anxious and emotionally labile. Cardiovascular: S1 S2 normal.  Regular rate rhythm. Respiratory: Mostly clear.  She has decreased air entry on the right side with decreased chest expansion and presence of chest tube.  Minimum pinkish fluid present on the chest tube. Chest tube on waterseal.  No air leak.   Gastrointestinal: Soft and nontender.  Bowel sounds present. Ext: No swelling or edema.  No cyanosis.      Data Review: I have personally reviewed the laboratory data and studies available.  F/u labs ordered Unresulted Labs (From admission, onward)    None       Signed, Dorcas Carrow, MD [facility] 07/15/2021  Total time spent: 30 minutes

## 2021-07-15 NOTE — Progress Notes (Addendum)
Subjective: CC: Tearful about still being in the hospital. Not having much pain. Some rib soreness. Had some sob earlier, none currently. Wheezing on exam. Reports 60 pack year history. Tolerating diet without n/v. Mobilized to nurses station yesterday. Voiding.   Objective: Vital signs in last 24 hours: Temp:  [98.1 F (36.7 C)-98.6 F (37 C)] 98.6 F (37 C) (10/31 0904) Pulse Rate:  [78-89] 78 (10/31 0904) Resp:  [16] 16 (10/31 0322) BP: (111-154)/(31-65) 119/63 (10/31 0904) SpO2:  [88 %-94 %] 90 % (10/31 0904) Last BM Date: 07/07/21  Intake/Output from previous day: 10/30 0701 - 10/31 0700 In: -  Out: 150 [Chest Tube:150] Intake/Output this shift: No intake/output data recorded.  PE: Gen:  Alert, NAD, pleasant Card:  RRR Pulm:  Expiratory wheezing b/l. Rate and effort normal. R chest tube site c/d/I. R chest tube was kinked. I unkinked this and taped to prevent this. CTon WS. 150cc of output in last 24 hrs.  Output is SS.  No airleak. On RA. 500 on IS.  Abd: Soft, ND, NT +BS Psych: A&Ox3   Lab Results:  Recent Labs    07/12/21 0948  WBC 12.2*  HGB 8.5*  HCT 26.8*  PLT 529*   BMET No results for input(s): NA, K, CL, CO2, GLUCOSE, BUN, CREATININE, CALCIUM in the last 72 hours. PT/INR No results for input(s): LABPROT, INR in the last 72 hours. CMP     Component Value Date/Time   NA 136 07/10/2021 0413   K 3.8 07/10/2021 0413   CL 109 07/10/2021 0413   CO2 19 (L) 07/10/2021 0413   GLUCOSE 119 (H) 07/10/2021 0413   BUN 19 07/10/2021 0413   CREATININE 0.62 07/10/2021 0413   CALCIUM 8.3 (L) 07/10/2021 0413   PROT 5.6 (L) 07/08/2021 1653   ALBUMIN 3.0 (L) 07/08/2021 1653   AST 17 07/08/2021 1653   ALT 9 07/08/2021 1653   ALKPHOS 73 07/08/2021 1653   BILITOT 0.2 (L) 07/08/2021 1653   GFRNONAA >60 07/10/2021 0413   GFRAA 45 (L) 03/27/2020 0855   Lipase     Component Value Date/Time   LIPASE 25 07/08/2021 1653    Studies/Results: DG CHEST PORT 1  VIEW  Result Date: 07/15/2021 CLINICAL DATA:  Right pneumothorax. EXAM: PORTABLE CHEST 1 VIEW COMPARISON:  07/14/2021 and CT chest 07/08/2021. FINDINGS: Trachea is midline. Heart size stable. Small bore chest tube terminates at the apex of the right hemithorax. There may be a trace right apical pneumothorax. Right basilar atelectasis and a small loculated right pleural effusion. Left lung is grossly clear. Moderate hiatal hernia. Right shoulder arthroplasty. Multiple rib fractures of varying ages bilaterally. IMPRESSION: 1. Difficult to exclude trace residual right apical pneumothorax with small bore right chest tube in place. 2. Right basilar atelectasis and small loculated right pleural effusion. Electronically Signed   By: Leanna Battles M.D.   On: 07/15/2021 08:35   DG CHEST PORT 1 VIEW  Result Date: 07/14/2021 CLINICAL DATA:  Pleural effusion EXAM: PORTABLE CHEST 1 VIEW COMPARISON:  Previous studies including the examination of 07/13/2021 FINDINGS: Transverse diameter of heart is increased. There are no signs of alveolar pulmonary edema. There is interval decrease in haziness in right mid and right lower lung fields. There is blunting of right lateral CP angle. No new focal infiltrates are seen. There is no pneumothorax. Right chest tube is noted with the tip in the medial aspect of right apex. Postsurgical changes are noted in cervical spine  and right shoulder. Calcifications are seen in the chest wall, possibly in reconstruction prostheses in the breasts. IMPRESSION: There is slight improvement in aeration of right mid and right lower lung fields which may suggest apparent change due to difference in the techniques or decrease in right pleural effusion. There are no signs of pulmonary edema or new focal infiltrates. Electronically Signed   By: Ernie Avena M.D.   On: 07/14/2021 12:15    Anti-infectives: Anti-infectives (From admission, onward)    None        Assessment/Plan GLF  R  rib fx 8-9 - pain control, IS/pulm toilet R HTX - Output >100/24 hrs.  CXR with what appears to be a loculated pleural effusion. Possible R apical PTX this AM. Will get CT chest to better evaluate. Currently on WS.  Sternal fx - pain control, pulm toilet Worsened T10 compression fx - NSGY c/s, Dr. Jordan Likes. No brace needed Wheezing - duoneb prn. Hx tobacco use ABL anemia - hgb stable FEN - Reg, add colace/miralax DVT - lovenox 30mg  BID  ID - none Dispo - CT chest. Therapies. SNF   LOS: 6 days    , [facility] 07/15/2021, 9:05 AM Please see Amion for pager number during day hours 7:00am-4:30pm

## 2021-07-16 ENCOUNTER — Inpatient Hospital Stay (HOSPITAL_COMMUNITY): Payer: Medicare Other

## 2021-07-16 DIAGNOSIS — S271XXA Traumatic hemothorax, initial encounter: Secondary | ICD-10-CM | POA: Diagnosis not present

## 2021-07-16 LAB — CBC WITH DIFFERENTIAL/PLATELET
Abs Immature Granulocytes: 0.03 10*3/uL (ref 0.00–0.07)
Basophils Absolute: 0.1 10*3/uL (ref 0.0–0.1)
Basophils Relative: 1 %
Eosinophils Absolute: 0.5 10*3/uL (ref 0.0–0.5)
Eosinophils Relative: 5 %
HCT: 22.7 % — ABNORMAL LOW (ref 36.0–46.0)
Hemoglobin: 7.3 g/dL — ABNORMAL LOW (ref 12.0–15.0)
Immature Granulocytes: 0 %
Lymphocytes Relative: 18 %
Lymphs Abs: 1.9 10*3/uL (ref 0.7–4.0)
MCH: 29.2 pg (ref 26.0–34.0)
MCHC: 32.2 g/dL (ref 30.0–36.0)
MCV: 90.8 fL (ref 80.0–100.0)
Monocytes Absolute: 1.4 10*3/uL — ABNORMAL HIGH (ref 0.1–1.0)
Monocytes Relative: 13 %
Neutro Abs: 6.7 10*3/uL (ref 1.7–7.7)
Neutrophils Relative %: 63 %
Platelets: 674 10*3/uL — ABNORMAL HIGH (ref 150–400)
RBC: 2.5 MIL/uL — ABNORMAL LOW (ref 3.87–5.11)
RDW: 14 % (ref 11.5–15.5)
WBC: 10.6 10*3/uL — ABNORMAL HIGH (ref 4.0–10.5)
nRBC: 0 % (ref 0.0–0.2)

## 2021-07-16 LAB — BASIC METABOLIC PANEL
Anion gap: 7 (ref 5–15)
BUN: 18 mg/dL (ref 8–23)
CO2: 24 mmol/L (ref 22–32)
Calcium: 8.3 mg/dL — ABNORMAL LOW (ref 8.9–10.3)
Chloride: 104 mmol/L (ref 98–111)
Creatinine, Ser: 0.88 mg/dL (ref 0.44–1.00)
GFR, Estimated: >60 mL/min (ref >60)
Glucose, Bld: 110 mg/dL — ABNORMAL HIGH (ref 70–99)
Potassium: 3.9 mmol/L (ref 3.5–5.1)
Sodium: 135 mmol/L (ref 135–145)

## 2021-07-16 LAB — MAGNESIUM: Magnesium: 2 mg/dL (ref 1.7–2.4)

## 2021-07-16 LAB — PHOSPHORUS: Phosphorus: 3.8 mg/dL (ref 2.5–4.6)

## 2021-07-16 MED ORDER — LIDOCAINE HCL (PF) 1 % IJ SOLN
INTRAMUSCULAR | Status: AC
  Filled 2021-07-16: qty 30

## 2021-07-16 MED ORDER — VENLAFAXINE HCL ER 75 MG PO CP24
150.0000 mg | ORAL_CAPSULE | Freq: Every day | ORAL | Status: DC
  Administered 2021-07-16 – 2021-07-20 (×5): 150 mg via ORAL
  Filled 2021-07-16 (×4): qty 2

## 2021-07-16 NOTE — Progress Notes (Signed)
Cards at bedside to perform chest tube placement and consent signed and procedure explained to patient, as done earlier this shift by MD. 2 mg Morphine given per Dr. Cliffton Asters at bedside via this RN. Patient getting upset and started saying she didn't know what was being done to her or who was in her room. Stated she had never seen the MD. Procedure stopped and Lidocaine wasted via this RN. Sterile items returned to sterile process via [facility]. Reported this off to primary nurse. Jillyn Hidden, RN

## 2021-07-16 NOTE — Progress Notes (Signed)
PT Cancellation Note  Patient Details Name: [name] MRN: [id number] DOB: 1951-05-09   Cancelled Treatment:    Reason Eval/Treat Not Completed: Patient at procedure or test/unavailable.  Pt looks to be having a new chest tube placed at this moment.  PT to check back later today or tomorrow as time allows.   Thanks,  Corinna Capra, PT, DPT  Acute Rehabilitation Ortho Tech Supervisor [phone] pager #[phone] office      Lurena Joiner B Jahan Friedlander 07/16/2021, 3:05 PM

## 2021-07-16 NOTE — Progress Notes (Signed)
Acute PROGRESS NOTE  [name]  DOB: 09-Dec-1950  PCP: Paulino Door, MD ESP:[id number]  DOA: 07/08/2021  LOS: 7 days  Hospital Day: 9  Chief Complaint  Patient presents with   Chest Pain    Brief narrative: [name] is a 70 y.o. female with PMH significant for HTN, HLD, chronic smoking, mitral valve prolapse, palpitation, anxiety/depression, asthma, chronic pain, osteopenia, osteoporosis who has been homeless for the last year and a half and has been moving between different shelters in peoples homes. Patient presented to the ED on 10/24 with complaint of right anterior rib pain on deep inspiration ongoing for 4 to 5 days.  In the ED, she was was afebrile, normotensive, initially maintaining oxygen saturation but while in the ED saturation dropped to 70s and she was placed on 2 L oxygen by nasal cannula.  On exam, she was noted to have numerous bruises on forehead and upper extremity but she could not recall what happened.  She also was noted to have difficulty with memory. Labs showed D-dimer elevated 1.56 CTA of the chest showed right eighth and ninth breath fracture, displaced mid sternal body fracture with large right-sided hemothorax.  Hemoglobin of 10.3 which down from 13.5 in September.   Patient had presented to the emergency room on 10/15 with injuries, chest x-rays were without definite evidence of fracture or complications so she was discharged from ER. Chest tube was placed by surgery on 10/25.   Subjective: Seen and examined.  Some pain remains on the right side. Chest tube continues to drain.  Chest x-ray shows persistent small hemothorax.  She had just talked to cardiothoracic surgery and was satisfied with plan.  She was hoping to avoid having to put another chest tube in.  Minimal pain.  But not mobilized.  Assessment/Plan: Hemothorax in the setting of acute right 8 to 9 rib fracture and displaced mid sternal body fracture -Currently with chest tube placement.   Continues to be on waterseal. -Followed by surgery.  Continue aggressive chest physiotherapy. - May need VATS or revision of chest tube.  CT surgery following today.   Acute blood loss anemia -expected blood loss from hemothorax.  Hemoglobin 7.3.  We will recheck every day.  No indication to transfuse now.  She may ultimately need transfusion due to ongoing blood loss from the right pleural cavity. Recent Labs    07/09/21 0401 07/09/21 0633 07/10/21 0413 07/12/21 0948 07/16/21 0323  HGB 8.6* 8.5* 7.9* 8.5* 7.3*  MCV  --   --  91.6 94.0 90.8   Acute hypoxia secondary to large hemothorax -Resolved.  Now on room air.   Recent fall Chronic T10 compression fracture -Patient asymptomatic at this time.  She has chronic pain. -Per trauma surgery, compression fracture seems worse in recent scan.  Seen by neurosurgery.  No surgical intervention recommended.    Anxiety/depression - Continue antidepressives, Ativan as needed.  Patient on multiple medications at home including trazodone, Wellbutrin and Remeron that she will continue.    Mobility: Encourage ambulation.  PT eval Living condition: Homeless Goals of care:   Code Status: Full Code  Nutritional status: Body mass index is 18.85 kg/m.     Diet:  Diet Order             Diet NPO time specified Except for: Sips with Meds  Diet effective midnight                  DVT prophylaxis:  enoxaparin (LOVENOX)  injection 30 mg Start: 07/09/21 2200 SCDs Start: 07/09/21 0035   Antimicrobials: None Fluid: None Consultants: Trauma surgery, neurosurgery.  CT surgery Family Communication: None.  Status is: Inpatient  Remains inpatient appropriate because: Actively managed chest tube with hemothorax.  Dispo: The patient is from: Homeless              Anticipated d/c is to: Skilled nursing facility.              Patient currently is not medically stable to d/c.  She still has a actively draining chest tube.   Difficult to place  patient No     Infusions:    Scheduled Meds:  amphetamine-dextroamphetamine  20 mg Oral BID WC   buPROPion  150 mg Oral QHS   docusate sodium  100 mg Oral BID   enoxaparin (LOVENOX) injection  30 mg Subcutaneous Q12H   mirtazapine  15 mg Oral QHS   nicotine  14 mg Transdermal Daily   polyethylene glycol  17 g Oral Daily   traZODone  300 mg Oral QHS   venlafaxine XR  150 mg Oral Q breakfast    PRN meds: acetaminophen, ipratropium-albuterol, LORazepam, methocarbamol, morphine injection, oxyCODONE   Antimicrobials: Anti-infectives (From admission, onward)    None       Objective: Vitals:   07/16/21 0743 07/16/21 1123  BP: 129/72 (!) 115/50  Pulse: 75 85  Resp: 16 20  Temp: 98.3 F (36.8 C) 98 F (36.7 C)  SpO2: 91% 98%    Intake/Output Summary (Last 24 hours) at 07/16/2021 1349 Last data filed at 07/16/2021 0800 Gross per 24 hour  Intake --  Output 30 ml  Net -30 ml   Filed Weights   07/08/21 1958  Weight: 56.2 kg   Weight change:  Body mass index is 18.85 kg/m.   Physical Exam:  General: Comfortable at rest.  On room air.  In good mood today. Cardiovascular: S1 S2 normal.  Regular rate rhythm. Respiratory: Mostly clear.  She has decreased air entry on the right side with decreased chest expansion and presence of chest tube.  Minimum pinkish fluid present on the chest tube. Chest tube on waterseal.  No air leak.   Gastrointestinal: Soft and nontender.  Bowel sounds present. Ext: No swelling or edema.  No cyanosis.      Data Review: I have personally reviewed the laboratory data and studies available.  F/u labs ordered Unresulted Labs (From admission, onward)    None       Signed, Dorcas Carrow, MD [facility] 07/16/2021  Total time spent: 30 minutes

## 2021-07-16 NOTE — Consult Note (Signed)
[address]       [place] [address]             [phone]                    [name] [facility] Medical Record #[id number] Date of Birth: September 13, 1951  Referring: No ref. provider found Primary Care: Paulino Door, MD Primary Cardiologist: None  Chief Complaint:    Chief Complaint  Patient presents with   Chest Pain    History of Present Illness:    [name] 70 y.o. female admitted to the trauma service several weeks after a ground level fall.  A chest tube was placed and drained 1L of old blood.  Repeat cross sectional imaging showed a retained posterior collection.  CTS was consulted to assist with management.    Past Medical History:  Diagnosis Date   Anxiety    Arthritis    back, joints   Asthma    Chronic pain    Constipation    Depression    Dysrhythmia    Palpitations   GERD (gastroesophageal reflux disease)    High cholesterol    Hypertension    resolved whn she stopped drinking alcohol   Mitral prolapse    dx as a child   Osteopenia    Osteoporosis     Past Surgical History:  Procedure Laterality Date   ABDOMINAL HYSTERECTOMY  1985   BACK SURGERY     BREAST SURGERY     augmentation   HARDWARE REMOVAL  02/27/2012   Procedure: HARDWARE REMOVAL;  Surgeon: Kerrin Champagne, MD;  Location: [facility] OR;  Service: Orthopedics;;  exploration and removal of portion of screw at T-!   JOINT REPLACEMENT Right    shoulder   left hip replacement  02-27-12   just screw - no replacement   NECK SURGERY     plates and screws   ORIF WRIST FRACTURE  1986   right   right femur replacement     ROTATOR CUFF REPAIR     right   SHOULDER HEMI-ARTHROPLASTY  02/27/2012   Procedure: SHOULDER HEMI-ARTHROPLASTY;  Surgeon: Kerrin Champagne, MD;  Location: [facility] OR;  Service: Orthopedics;  Laterality: Right;  Right shoulder hemiarthroplasty, repair right rotator cuff   TONSILLECTOMY     TOTAL KNEE ARTHROPLASTY Left 03/27/2020   TOTAL KNEE ARTHROPLASTY  Left 03/27/2020   Procedure: LEFT TOTAL KNEE ARTHROPLASTY;  Surgeon: Cammy Copa, MD;  Location: [facility] OR;  Service: Orthopedics;  Laterality: Left;   WRIST SURGERY     tendon repair from a dog bite- left    No family history on file.   Social History   Tobacco Use  Smoking Status Every Day   Packs/day: 0.50   Years: 50.00   Pack years: 25.00   Types: Cigarettes  Smokeless Tobacco Never    Social History   Substance and Sexual Activity  Alcohol Use Yes   Comment: ocasionally     Allergies  Allergen Reactions   Penicillins Rash and Other (See Comments)    And, from childhood: Had at 70 yrs old and fainted ("with the sight of the large needle.") Has tolerated oral cephalexin and intraoperative cefazolin without issues.      Vancomycin Rash and Other (See Comments)    "I turned red"     Current Facility-Administered Medications  Medication Dose Route Frequency Provider Last Rate Last Admin   acetaminophen (TYLENOL)  tablet 1,000 mg  1,000 mg Oral Q6H PRN Jacinto Halim, PA-C   1,000 mg at 07/16/21 0544   amphetamine-dextroamphetamine (ADDERALL) tablet 20 mg  20 mg Oral BID WC Dorcas Carrow, MD   20 mg at 07/16/21 0814   buPROPion (WELLBUTRIN XL) 24 hr tablet 150 mg  150 mg Oral QHS Tu, Ching T, DO   150 mg at 07/15/21 2026   docusate sodium (COLACE) capsule 100 mg  100 mg Oral BID Jacinto Halim, PA-C   100 mg at 07/16/21 0814   enoxaparin (LOVENOX) injection 30 mg  30 mg Subcutaneous Q12H Diamantina Monks, MD   30 mg at 07/16/21 0828   ipratropium-albuterol (DUONEB) 0.5-2.5 (3) MG/3ML nebulizer solution 3 mL  3 mL Nebulization Q6H PRN Maczis, Elmer Sow, PA-C       lidocaine (PF) (XYLOCAINE) 1 % injection            LORazepam (ATIVAN) tablet 1 mg  1 mg Oral BID PRN Tu, Ching T, DO   1 mg at 07/14/21 2107   methocarbamol (ROBAXIN) tablet 750 mg  750 mg Oral TID PRN Benita Gutter T, DO   750 mg at 07/16/21 0544   mirtazapine (REMERON) tablet 15 mg  15 mg Oral QHS Tu,  Ching T, DO   15 mg at 07/15/21 2026   morphine 2 MG/ML injection 1 mg  1 mg Intravenous Q3H PRN Tu, Ching T, DO   2 mg at 07/16/21 1505   nicotine (NICODERM CQ - dosed in mg/24 hours) patch 14 mg  14 mg Transdermal Daily Dorcas Carrow, MD   14 mg at 07/16/21 3220   oxyCODONE (Oxy IR/ROXICODONE) immediate release tablet 5 mg  5 mg Oral Q4H PRN Jacinto Halim, PA-C   5 mg at 07/16/21 1014   polyethylene glycol (MIRALAX / GLYCOLAX) packet 17 g  17 g Oral Daily Jacinto Halim, PA-C   17 g at 07/16/21 2542   traZODone (DESYREL) tablet 300 mg  300 mg Oral QHS Dorcas Carrow, MD   300 mg at 07/15/21 2026   venlafaxine XR (EFFEXOR-XR) 24 hr capsule 150 mg  150 mg Oral Q breakfast Dorcas Carrow, MD        Review of Systems  Respiratory:  Positive for shortness of breath.   Psychiatric/Behavioral:  The patient is nervous/anxious.    PHYSICAL EXAMINATION: BP (!) 115/50 (BP Location: Right Arm)   Pulse 85   Temp 98 F (36.7 C) (Oral)   Resp 20   Ht 5\' 8"  (1.727 m)   Wt 56.2 kg   SpO2 98%   BMI 18.85 kg/m   Physical Exam Constitutional:      General: She is not in acute distress.    Appearance: She is ill-appearing and diaphoretic. She is not toxic-appearing.  HENT:     Head: Normocephalic and atraumatic.  Cardiovascular:     Rate and Rhythm: Normal rate.  Pulmonary:     Effort: Pulmonary effort is normal.  Musculoskeletal:     Cervical back: Normal range of motion.  Neurological:     Mental Status: She is alert.     Diagnostic Studies & Laboratory data:     Recent Radiology Findings:   CT ABDOMEN PELVIS WO CONTRAST  Result Date: 07/09/2021 CLINICAL DATA:  Fall. EXAM: CT ABDOMEN AND PELVIS WITHOUT CONTRAST TECHNIQUE: Multidetector CT imaging of the abdomen and pelvis was performed following the standard protocol without IV contrast. COMPARISON:  May 24, 2021.  July 08, 2021. FINDINGS: Lower chest: Minimal anterior pneumothorax is noted in the right lung base.  Right-sided chest tube is noted. Right pleural effusion is noted with associated right lower lobe atelectasis. Moderate size sliding-type hiatal hernia is noted. Hepatobiliary: Small gallstone is noted. No biliary dilatation is noted. The liver is unremarkable on these unenhanced images. Pancreas: Unremarkable. No pancreatic ductal dilatation or surrounding inflammatory changes. Spleen: Normal in size without focal abnormality. Adrenals/Urinary Tract: Adrenal glands are unremarkable. Kidneys are normal, without renal calculi, focal lesion, or hydronephrosis. Bladder is unremarkable. Stomach/Bowel: The stomach appears normal. There is no evidence of bowel obstruction or inflammation. Vascular/Lymphatic: Aortic atherosclerosis. No enlarged abdominal or pelvic lymph nodes. Reproductive: Status post hysterectomy. No adnexal masses. Other: No abdominal wall hernia or abnormality. No abdominopelvic ascites. Musculoskeletal: Probably acute moderate compression deformity of T9 vertebral body is noted. IMPRESSION: Minimal anterior pneumothorax is noted in the visualized portion of right lung base. Right-sided chest tube is again noted. Right pleural effusion is noted with associated right lower lobe atelectasis. Moderate size sliding-type hiatal hernia. Cholelithiasis without evidence of cholecystitis. Probably acute moderate compression fracture of T9 vertebral body. Aortic Atherosclerosis (ICD10-I70.0). Electronically Signed   By: Lupita Raider M.D.   On: 07/09/2021 14:22   DG Chest 2 View  Result Date: 07/08/2021 CLINICAL DATA:  Bilateral rib pain. EXAM: CHEST - 2 VIEW COMPARISON:  Chest x-ray dated June 29, 2021. FINDINGS: Normal heart size. New moderate right pleural effusion right middle and lower lobe atelectasis. No pneumothorax. No acute osseous abnormality. Old right-sided rib fractures. IMPRESSION: 1. New moderate right pleural effusion with right middle and lower lobe atelectasis. Electronically Signed    By: Obie Dredge M.D.   On: 07/08/2021 14:42   DG Ribs Unilateral W/Chest Right  Result Date: 06/29/2021 CLINICAL DATA:  Fall, pain EXAM: RIGHT RIBS AND CHEST - 3+ VIEW COMPARISON:  None. FINDINGS: Overlying calcified breast implant capsule limits evaluation. No displaced fracture or other bone lesions are seen involving the ribs. There is no evidence of pneumothorax or pleural effusion. Status post right shoulder arthroplasty. Both lungs are clear. Heart size and mediastinal contours are within normal limits. IMPRESSION: 1. Overlying calcified breast implant capsule limits evaluation. Within this limitation, no displaced rib fracture or other bone lesions identified. No pneumothorax. 2. Status post right shoulder arthroplasty. Electronically Signed   By: Jearld Lesch M.D.   On: 06/29/2021 14:40   CT HEAD WO CONTRAST ( )  Result Date: 07/09/2021 CLINICAL DATA:  Fall EXAM: CT HEAD WITHOUT CONTRAST TECHNIQUE: Contiguous axial images were obtained from the base of the skull through the vertex without intravenous contrast. COMPARISON:  CT head 10/17/2008 FINDINGS: Brain: There is no acute intracranial hemorrhage, extra-axial fluid collection, or acute infarct. There is mild parenchymal volume loss with commensurate enlargement of the ventricular system. Hypodensity in the subcortical and periventricular white matter likely reflects sequela of moderate chronic white matter microangiopathy. There is no mass lesion.  There is no midline shift. Vascular: There is calcification of the bilateral cavernous ICAs. Skull: Normal. Negative for fracture or focal lesion. Sinuses/Orbits: The imaged paranasal sinuses are clear. The globes and orbits are unremarkable. Other: None. IMPRESSION: 1. No acute intracranial pathology. 2. Moderate global parenchymal volume loss and chronic white matter microangiopathy. Electronically Signed   By: Lesia Hausen M.D.   On: 07/09/2021 14:45   CT CHEST WO CONTRAST  Result Date:  07/15/2021 CLINICAL DATA:  Pleural effusion EXAM: CT CHEST WITHOUT CONTRAST TECHNIQUE: Multidetector CT imaging  of the chest was performed following the standard protocol without IV contrast. COMPARISON:  CT chest 07/08/2021, chest x-ray 07/15/2021 FINDINGS: Cardiovascular: Heart size is upper normal. No pericardial effusion identified. Coronary artery calcifications noted. Main pulmonary artery is normal caliber. Thoracic aorta is tortuous and normal caliber with mild calcified plaques. Mediastinum/Nodes: No bulky axillary, mediastinal or definite hilar lymphadenopathy identified, given limitations of a noncontrast study. Lungs/Pleura: Right-sided chest tube in place with the tip at the apex. Persistent moderate to large right-sided pleural effusion with heterogeneous increased density. Associated compressive atelectasis, the collection is decreased in size since previous CT. There is small pneumothorax. Scattered mild subsegmental atelectatic changes bilaterally and mild ground-glass mosaic attenuation in the lungs, greater on the left. Upper Abdomen: Small hiatal hernia. No acute process identified in the visualized upper abdomen. Musculoskeletal: Stable fracture in the body of the sternum. Degenerative changes and stable compression fracture deformities in the thoracic spine. Acute and chronic right rib fracture deformities. IMPRESSION: 1. Persistent moderate to large right-sided pleural effusion and hemothorax with associated atelectatic changes. Right-sided chest tube is in place with the tip near the apex. Small right pneumothorax. 2. Small hiatal hernia. 3. Stable recent sternal fracture and right rib fractures. Electronically Signed   By: Jannifer Hick M.D.   On: 07/15/2021 11:03   CT Angio Chest PE W and/or Wo Contrast  Result Date: 07/08/2021 CLINICAL DATA:  Pt endorses bilateral rib pain since Friday. Denies SOB, but has pain with deep breathing. Denies any falls. EXAM: CT ANGIOGRAPHY CHEST  WITH CONTRAST TECHNIQUE: Multidetector CT imaging of the chest was performed using the standard protocol during bolus administration of intravenous contrast. Multiplanar CT image reconstructions and MIPs were obtained to evaluate the vascular anatomy. CONTRAST:  55mL OMNIPAQUE IOHEXOL 350 MG/ML SOLN COMPARISON:  Chest x-ray 06/29/2021, CT chest 06/14/2020 FINDINGS: Cardiovascular: Satisfactory opacification of the pulmonary arteries to the segmental level. No evidence of pulmonary embolism. Normal heart size. No significant pericardial effusion. The thoracic aorta is normal in caliber. Mild atherosclerotic plaque of the thoracic aorta. At least 2 vessel coronary artery calcifications. Mediastinum/Nodes: No enlarged mediastinal, hilar, or axillary lymph nodes. Thyroid gland, trachea, and esophagus demonstrate no significant findings. Small to moderate volume hiatal hernia. Lungs/Pleura: Partial collapse of the right lower lobe. No focal consolidation. No pulmonary nodule. No pulmonary mass. Moderate volume right pleural fluid with hyperdensity along the rib fractures consistent with a hemothorax. No left pleural effusion. No pneumothorax. Upper Abdomen: No acute abnormality. Musculoskeletal: Intra and extracapsular rupture of a bilateral breast implants. Associated circumferential calcifications. Chronic, but worsened from 06/14/2020, compression fracture of the T10 vertebral body with approximately 50% vertebral body height loss. Multiple other thoracic vertebral bodies demonstrate decreased vertebral body height loss. Old healed left rib fractures.  Old healed right rib fractures. Interval development of an acute right posterior eighth and ninth rib fractures (compared to CT chest 06/14/2020, retrospectively these may have been present on x-ray ribs 06/29/2021). Acute minimally displaced mid sternal body fracture. Right shoulder arthroplasty partially visualized. Review of the MIP images confirms the above  findings. IMPRESSION: 1. No pulmonary embolus. 2. Acute posterior right 8 and 9 rib fractures with associated moderate volume hemothorax. Retrospectively fractures may have been present on x-ray ribs 06/29/2021. 3. Acute minimally displaced mid sternal body fracture. 4. Small to moderate volume hiatal hernia. 5. Chronic, but worsened from 06/14/2020, compression fracture of the T10 vertebral body with approximately 50% vertebral body height loss. Recommend correlation with point tenderness to palpation for  an acute component. 6. 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 Correlate with mammography. 7.  Aortic Atherosclerosis (ICD10-I70.0). Electronically Signed   By: Tish Frederickson M.D.   On: 07/08/2021 23:00   CT CERVICAL SPINE WO CONTRAST  Result Date: 07/09/2021 CLINICAL DATA:  Fall EXAM: CT CERVICAL SPINE WITHOUT CONTRAST TECHNIQUE: Multidetector CT imaging of the cervical spine was performed without intravenous contrast. Multiplanar CT image reconstructions were also generated. COMPARISON:  Cervical spine CT 12/01/2011, cervical spine radiographs 02/07/2020 FINDINGS: Alignment: There is straightening of the cervical spine curvature. There is trace anterolisthesis of C2 on C3, 3 mm anterolisthesis of C3 on C4 and C4 on C5, and 4 mm anterolisthesis of T1 on T2, all slightly increased since 2013. There is no jumped or perched facet or other evidence of traumatic malalignment. Skull base and vertebrae: Skull base alignment is maintained. There are postsurgical changes reflecting ACDF spanning from C5 through T1 and separate anterior fusion at C4-C5. Posterior fusion hardware extends from C4 through T1. The patient is status post left-sided corpectomy at T7 there is partial osseous fusion across the C4-C5 level and solid-appearing fusion across the C5-C6, C6-C7, and C7-T1 levels. A portion of a previously fractured screw remains within the T1 vertebral body. There is increased  irregularity of the superior T2 endplate with mild loss of vertebral body height anteriorly. There is no evidence of acute fracture. Soft tissues and spinal canal: There is no definite evidence of hematoma in the spinal canal, though evaluation is degraded by significant streak artifact. The soft tissues are otherwise unremarkable. Disc levels: Since 2013, there has been significant interval worsening of adjacent segment disease at T1-T2. The osseous spinal canal appears overall patent. There is multilevel facet arthropathy throughout the cervical spine resulting in moderate to severe bilateral neural foraminal stenosis at T1-T2. There is no definite high-grade neural foraminal stenosis at the remaining levels. Upper chest: A chest tube is seen coiled in the right apex. There is a partially imaged right pleural effusion. Patchy opacity in the left apex was not seen on the CTA chest from 1 day prior. Other: None. IMPRESSION: 1. No acute fracture or traumatic malalignment of the cervical spine. 2. Postsurgical changes reflecting anterior and posterior instrumented fusion spanning from C4 through T1 without evidence of complication. 3. 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 4. Patchy opacities in the left apex were not definitely present on the prior CT chest and may reflect developing infection or aspiration. Electronically Signed   By: Lesia Hausen M.D.   On: 07/09/2021 14:38   DG CHEST PORT 1 VIEW  Result Date: 07/16/2021 CLINICAL DATA:  Hemothorax, chest tube EXAM: PORTABLE CHEST 1 VIEW COMPARISON:  07/15/2021 FINDINGS: No significant change in AP portable chest radiograph, with pigtail chest tube positioned, tip about the right apex. No significant residual pneumothorax. Small, layering right pleural effusion and elevation of the right hemidiaphragm. The left lung is normally aerated. Cardiomegaly. Calcified bilateral breast implants. Status post right shoulder arthroplasty.  IMPRESSION: 1. No significant change in AP portable chest radiograph, with pigtail chest tube positioned, tip about the right apex. No significant residual pneumothorax. 2. Small, layering right pleural effusion and elevation of the right hemidiaphragm. Electronically Signed   By: Jearld Lesch M.D.   On: 07/16/2021 08:20   DG CHEST PORT 1 VIEW  Result Date: 07/15/2021 CLINICAL DATA:  Right pneumothorax. EXAM: PORTABLE CHEST 1 VIEW COMPARISON:  07/14/2021 and CT chest 07/08/2021. FINDINGS: Trachea  is midline. Heart size stable. Small bore chest tube terminates at the apex of the right hemithorax. There may be a trace right apical pneumothorax. Right basilar atelectasis and a small loculated right pleural effusion. Left lung is grossly clear. Moderate hiatal hernia. Right shoulder arthroplasty. Multiple rib fractures of varying ages bilaterally. IMPRESSION: 1. Difficult to exclude trace residual right apical pneumothorax with small bore right chest tube in place. 2. Right basilar atelectasis and small loculated right pleural effusion. Electronically Signed   By: Leanna Battles M.D.   On: 07/15/2021 08:35   DG CHEST PORT 1 VIEW  Result Date: 07/14/2021 CLINICAL DATA:  Pleural effusion EXAM: PORTABLE CHEST 1 VIEW COMPARISON:  Previous studies including the examination of 07/13/2021 FINDINGS: Transverse diameter of heart is increased. There are no signs of alveolar pulmonary edema. There is interval decrease in haziness in right mid and right lower lung fields. There is blunting of right lateral CP angle. No new focal infiltrates are seen. There is no pneumothorax. Right chest tube is noted with the tip in the medial aspect of right apex. Postsurgical changes are noted in cervical spine and right shoulder. Calcifications are seen in the chest wall, possibly in reconstruction prostheses in the breasts. IMPRESSION: There is slight improvement in aeration of right mid and right lower lung fields which may  suggest apparent change due to difference in the techniques or decrease in right pleural effusion. There are no signs of pulmonary edema or new focal infiltrates. Electronically Signed   By: Ernie Avena M.D.   On: 07/14/2021 12:15   DG CHEST PORT 1 VIEW  Result Date: 07/13/2021 CLINICAL DATA:  Shortness of breath. EXAM: PORTABLE CHEST 1 VIEW COMPARISON:  07/12/2021 prior studies FINDINGS: The cardiomediastinal silhouette is unchanged. RIGHT thoracostomy catheter again noted with relatively unchanged RIGHT pleural effusion and RIGHT LOWER lung atelectasis/consolidation. Pulmonary vascular congestion and mild bilateral interstitial opacities are again noted as well as mild LEFT basilar atelectasis. There is no evidence of pneumothorax. No significant changes are identified. IMPRESSION: Unchanged chest radiograph with RIGHT thoracostomy catheter, RIGHT pleural effusion and RIGHT LOWER lung atelectasis/consolidation. Electronically Signed   By: Harmon Pier M.D.   On: 07/13/2021 11:36   DG CHEST PORT 1 VIEW  Result Date: 07/12/2021 CLINICAL DATA:  Hemothorax EXAM: PORTABLE CHEST 1 VIEW COMPARISON:  07/11/2021 FINDINGS: Pleural drainage catheter remains positioned at the right lung apex. Stable heart size. Increasing right-sided pleural effusion which appears loculated laterally. Hazy right basilar opacity. Prominent interstitial markings bilaterally. No pneumothorax is seen. IMPRESSION: Slightly increasing right-sided pleural effusion with right chest tube in place. No pneumothorax. Electronically Signed   By: Duanne Guess D.O.   On: 07/12/2021 09:03   DG CHEST PORT 1 VIEW  Result Date: 07/11/2021 CLINICAL DATA:  Hemothorax EXAM: PORTABLE CHEST 1 VIEW COMPARISON:  July 10, 2021 FINDINGS: The cardiomediastinal silhouette is unchanged in contour.RIGHT-sided chest tube. Small RIGHT pleural effusion. No significant pneumothorax. Scattered bibasilar atelectasis. Visualized abdomen is  unremarkable. Calcified breast implants. Status post RIGHT shoulder arthroplasty. Status post ACDF and posterior fixation of the cervical spine. Multilevel degenerative changes of the thoracolumbar spine with revisualization of wedging near the thoracolumbar junction. IMPRESSION: Scattered bibasilar atelectasis a small RIGHT pleural effusion. No significant pneumothorax. Electronically Signed   By: Meda Klinefelter M.D.   On: 07/11/2021 08:06   DG Chest Port 1 View  Result Date: 07/10/2021 CLINICAL DATA:  70 year old female with RIGHT-sided chest tube. EXAM: PORTABLE CHEST 1 VIEW COMPARISON:  January 07, 2021 and CT of the chest January 06, 2021. FINDINGS: Insertion of RIGHT-sided chest tube since October 24th, position stable when compared to July 09, 2021. Tip in the RIGHT lung apex. EKG leads projecting over the chest. Cardiomediastinal contours are stable. Improved aeration at the LEFT lung base compared to the recent chest x-ray. Improved aeration at the RIGHT lung base since October 24th, still with some graded opacity suggests residual pleural fluid. Linear opacities within aerated portions of lung compatible with atelectasis. No visible pneumothorax. Osteopenia. RIGHT shoulder arthroplasty and signs of cervical spinal fusion. IMPRESSION: Increased lung volumes slightly since previous imaging. Suspect persistent small RIGHT pleural effusion and concomitant atelectasis. No visible pneumothorax. Stable appearance of the RIGHT chest tube. Electronically Signed   By: Donzetta Kohut M.D.   On: 07/10/2021 13:48   DG Chest Port 1 View  Result Date: 07/09/2021 CLINICAL DATA:  Respiratory failure.  Chest tube placement. EXAM: PORTABLE CHEST 1 VIEW COMPARISON:  CT angiogram chest 07/08/2021. Chest radiographs 07/08/2021. FINDINGS: Heart size at the upper limits of normal, unchanged. Interval placement of a right-sided chest tube with tip projecting at the level of the right lung apex. No evidence of  pneumothorax. Apparent elevation of the right hemidiaphragm likely reflecting a small to moderate residual right pleural effusion/hemothorax. Additional ill-defined opacity within the mid to lower right lung field, and within the left lung base, likely reflecting atelectasis. Known right rib fractures, sternal fractures and vertebral fractures better characterized on the prior chest CT of 07/08/2021. Prior right shoulder arthroplasty. Fusion hardware within the cervical and upper thoracic spine. IMPRESSION: Interval placement of a right-sided chest tube with tip projecting at the level of the right lung apex. No pneumothorax. Significant interval decrease in size of a right pleural effusion/hemothorax, with a small-to-moderate volume residual. Atelectasis at the right greater than left lung bases. Electronically Signed   By: Jackey Loge D.O.   On: 07/09/2021 11:47   US Abdomen Limited RUQ (LIVER/GB)  Result Date: 07/08/2021 CLINICAL DATA:  Right upper quadrant abdominal pain, nausea vomiting. EXAM: ULTRASOUND ABDOMEN LIMITED RIGHT UPPER QUADRANT COMPARISON:  None. FINDINGS: Gallbladder: No gallstones or wall thickening visualized. No sonographic Murphy sign noted by sonographer. Small focus of adenomyomatosis. Common bile duct: Diameter: 5 mm Liver: No focal lesion identified. Within normal limits in parenchymal echogenicity. Portal vein is patent on color Doppler imaging with normal direction of blood flow towards the liver. Other: Small right pleural effusion. IMPRESSION: Small right pleural effusion, otherwise unremarkable right upper quadrant ultrasound. Electronically Signed   By: Elgie Collard M.D.   On: 07/08/2021 19:57       I have independently reviewed the above radiology studies  and reviewed the findings with the patient.   Recent Lab Findings: Lab Results  Component Value Date   WBC 10.6 (H) 07/16/2021   HGB 7.3 (L) 07/16/2021   HCT 22.7 (L) 07/16/2021   PLT 674 (H) 07/16/2021    GLUCOSE 110 (H) 07/16/2021   CHOL  01/06/2010    142        ATP III CLASSIFICATION:  <200     mg/dL   Desirable  712-458  mg/dL   Borderline High  >=099    mg/dL   High          TRIG 833 01/06/2010   HDL 52 01/06/2010   LDLCALC  01/06/2010    66        Total Cholesterol/HDL:CHD Risk Coronary Heart Disease Risk Table  Men   Women  1/2 Average Risk   3.4   3.3  Average Risk       5.0   4.4  2 X Average Risk   9.6   7.1  3 X Average Risk  23.4   11.0        Use the calculated Patient Ratio above and the CHD Risk Table to determine the patient's CHD Risk.        ATP III CLASSIFICATION (LDL):  <100     mg/dL   Optimal  578-469  mg/dL   Near or Above                    Optimal  130-159  mg/dL   Borderline  629-528  mg/dL   High  >413     mg/dL   Very High   ALT 9 24/40/1027   AST 17 07/08/2021   NA 135 07/16/2021   K 3.9 07/16/2021   CL 104 07/16/2021   CREATININE 0.88 07/16/2021   BUN 18 07/16/2021   CO2 24 07/16/2021   TSH 1.621 07/08/2021   INR 1.1 07/08/2021   HGBA1C  01/05/2010    5.3 (NOTE)                                                                       According to the ADA Clinical Practice Recommendations for 2011, when HbA1c is used as a screening test:   >=6.5%   Diagnostic of Diabetes Mellitus           (if abnormal result  is confirmed)  5.7-6.4%   Increased risk of developing Diabetes Mellitus  References:Diagnosis and Classification of Diabetes Mellitus,Diabetes Care,2011,34(Suppl 1):S62-S69 and Standards of Medical Care in         Diabetes - 2011,Diabetes Care,2011,34  (Suppl 1):S11-S61.         Assessment / Plan:   70 yo female s/p ground level fall with retained hemothorax.  The original chest tube drained ~1L of fluid, but it anterior.  Her posterior collection is accessible via a posterior directed tube.  The patient originally agreed to this, but then refused while prepping.  Would recommend an image guided tube at this  point.          Corliss Skains 07/16/2021 3:23 PM

## 2021-07-16 NOTE — Progress Notes (Signed)
Physical Therapy Treatment Patient Details Name: [name] MRN: [id number] DOB: 1951/03/06 Today's Date: 07/16/2021   History of Present Illness 70 yo female presents to [facility] on 07/08/21 with bilat rib pain x4 days, fall x1 week ago. CT shows R rib 8-9 fx, R hemothorax, sternal fracture, chronic thoracic spine fractures. Chest tube placed 10/25. PMH includes anxiety, OA, asthma, depression, HTN, OP, R shoulder replacement, L hip pinning 2013, L TKR 2021, smoker, previous C spine surgeries. Multiple ED visits.    PT Comments    Limited session again as we only walked to the bathroom and back.  Pt removed O2 for short trip to the bathroom and reported 2/4 DOE and O2 sats dropped to 90% on RA.  O2 via [place] returned to nose at the end of the session.  RN reports she refused the chest tube and pt reports it was because she did not know the doctor and wanted someone she had met before to do it.  She continues to be impulsive and emotionally labile, crying about her recently deceased son ( 3 months ago).  It sounds like they may have been estranged and she found out about his death on the Internet.  I am not really sure that she is not far from her baseline cognition or mobility (minus the chest tube and oxygen).  Therapy may try to increase frequency to attempt to get her to a higher level of mobility.   Recommendations for follow up therapy are one component of a multi-disciplinary discharge planning process, led by the attending physician.  Recommendations may be updated based on patient status, additional functional criteria and insurance authorization.  Follow Up Recommendations  Skilled nursing-short term rehab (<3 hours/day)     Assistance Recommended at Discharge Frequent or constant Supervision/Assistance  Equipment Recommendations  None recommended by PT    Recommendations for Other Services       Precautions / Restrictions Precautions Precautions: Fall;Other (comment) Precaution  Comments: R pigtail chest tube to water seal     Mobility  Bed Mobility Overal bed mobility: Needs Assistance Bed Mobility: Supine to Sit;Sit to Supine     Supine to sit: Supervision Sit to supine: Supervision   General bed mobility comments: Supervision for safety, pt with HOB maximally elevated, using arms to pull trunk up to sitting EOB.    Transfers Overall transfer level: Needs assistance Equipment used: Rolling walker (2 wheels) Transfers: Sit to/from Stand Sit to Stand: Supervision;Min guard           General transfer comment: Min guard assist to stand from bed, stabilize RW and provide a hand to prevent her from taking off right away.  supervision from toilet that is higher and has grab bars.    Ambulation/Gait Ambulation/Gait assistance: Min guard Gait Distance (Feet): 15 Feet Assistive device: Rolling walker (2 wheels) Gait Pattern/deviations: Step-through pattern;Decreased stride length;Trunk flexed;Narrow base of support     General Gait Details: Min guard assist for safety due to impulsivity, cues to get closer to RW ignored, assist mostly needed to help slow RW and control it during gait.   Stairs             Wheelchair Mobility    Modified Rankin (Stroke Patients Only)       Balance Overall balance assessment: Needs assistance Sitting-balance support: Feet supported;No upper extremity supported Sitting balance-Leahy Scale: Good     Standing balance support: Bilateral upper extremity supported;Single extremity supported Standing balance-Leahy Scale: Poor Standing balance comment:  reliant on external assist                            Cognition Arousal/Alertness: Awake/alert Behavior During Therapy: Impulsive Overall Cognitive Status: No family/caregiver present to determine baseline cognitive functioning                                 General Comments: Unsure of baseline, pt impulsive, quick to move,  emotionally labile (crying on and off)        Exercises      General Comments General comments (skin integrity, edema, etc.): Pt reports 2/4 DOE after short distance gait to the bathroom and back to bed (refused OOB to chair).  O2 sats 90% on RA for short distance gait.  O2 via [place] returned to nose at end of session.      Pertinent Vitals/Pain Pain Assessment: Faces Faces Pain Scale: Hurts even more Pain Location: chest wall, R ribs Pain Descriptors / Indicators: Sore;Discomfort;Grimacing;Guarding Pain Intervention(s): Limited activity within patient's tolerance;Monitored during session;Repositioned    Home Living                          Prior Function            PT Goals (current goals can now be found in the care plan section) Acute Rehab PT Goals Patient Stated Goal: for "a little bit of compassion" Progress towards PT goals: Progressing toward goals    Frequency    Min 3X/week      PT Plan Current plan remains appropriate    Co-evaluation              AM-PAC PT "6 Clicks" Mobility   Outcome Measure  Help needed turning from your back to your side while in a flat bed without using bedrails?: A Little Help needed moving from lying on your back to sitting on the side of a flat bed without using bedrails?: A Little Help needed moving to and from a bed to a chair (including a wheelchair)?: A Little Help needed standing up from a chair using your arms (e.g., wheelchair or bedside chair)?: A Little Help needed to walk in hospital room?: A Little Help needed climbing 3-5 steps with a railing? : A Little 6 Click Score: 18    End of Session   Activity Tolerance: Other (comment) (pt self limiting) Patient left: in bed;with call bell/phone within reach;with bed alarm set Nurse Communication: Mobility status;Other (comment) (pt requesting a drink and is still NPO) PT Visit Diagnosis: Other abnormalities of gait and mobility (R26.89);Muscle weakness  (generalized) (M62.81)     Time: 5498-2641 PT Time Calculation (min) (ACUTE ONLY): 17 min  Charges:  $Therapeutic Activity: 8-22 mins                     Verdene Lennert, PT, DPT  Acute Rehabilitation Ortho Tech Supervisor [phone] pager [phone]) [phone] office

## 2021-07-16 NOTE — Progress Notes (Signed)
OT Cancellation Note  Patient Details Name: [name] MRN: [id number] DOB: 1950/10/29   Cancelled Treatment:    Reason Eval/Treat Not Completed: Patient at procedure or test/ unavailable;Other (comment) (pt having chest tube placed) will f/u as time allows for OT session.   Lenor Derrick., COTA/L Acute Rehabilitation Services [phone]   Barron Schmid 07/16/2021, 2:52 PM

## 2021-07-17 LAB — CBC WITH DIFFERENTIAL/PLATELET
Abs Immature Granulocytes: 0.05 10*3/uL (ref 0.00–0.07)
Basophils Absolute: 0.1 10*3/uL (ref 0.0–0.1)
Basophils Relative: 1 %
Eosinophils Absolute: 0.8 10*3/uL — ABNORMAL HIGH (ref 0.0–0.5)
Eosinophils Relative: 7 %
HCT: 24.4 % — ABNORMAL LOW (ref 36.0–46.0)
Hemoglobin: 7.9 g/dL — ABNORMAL LOW (ref 12.0–15.0)
Immature Granulocytes: 0 %
Lymphocytes Relative: 15 %
Lymphs Abs: 1.7 10*3/uL (ref 0.7–4.0)
MCH: 29.5 pg (ref 26.0–34.0)
MCHC: 32.4 g/dL (ref 30.0–36.0)
MCV: 91 fL (ref 80.0–100.0)
Monocytes Absolute: 1.2 10*3/uL — ABNORMAL HIGH (ref 0.1–1.0)
Monocytes Relative: 11 %
Neutro Abs: 7.7 10*3/uL (ref 1.7–7.7)
Neutrophils Relative %: 66 %
Platelets: 742 10*3/uL — ABNORMAL HIGH (ref 150–400)
RBC: 2.68 MIL/uL — ABNORMAL LOW (ref 3.87–5.11)
RDW: 14.2 % (ref 11.5–15.5)
WBC: 11.6 10*3/uL — ABNORMAL HIGH (ref 4.0–10.5)
nRBC: 0 % (ref 0.0–0.2)

## 2021-07-17 MED ORDER — METHOCARBAMOL 750 MG PO TABS
750.0000 mg | ORAL_TABLET | Freq: Four times a day (QID) | ORAL | Status: DC
  Administered 2021-07-17 – 2021-07-20 (×11): 750 mg via ORAL
  Filled 2021-07-17 (×11): qty 1

## 2021-07-17 MED ORDER — DIPHENHYDRAMINE HCL 25 MG PO CAPS
25.0000 mg | ORAL_CAPSULE | Freq: Every evening | ORAL | Status: DC | PRN
  Administered 2021-07-18 – 2021-07-19 (×2): 25 mg via ORAL
  Filled 2021-07-17 (×3): qty 1

## 2021-07-17 NOTE — Progress Notes (Signed)
PROGRESS NOTE  [name]  DOB: July 01, 1951  PCP: Paulino Door, MD BWL:[id number]  DOA: 07/08/2021  LOS: 8 days  Hospital Day: 10  Chief Complaint  Patient presents with   Chest Pain    Brief narrative: [name] is a 70 y.o. female with PMH significant for HTN, HLD, chronic smoking, mitral valve prolapse, palpitation, anxiety/depression, asthma, chronic pain, osteopenia, osteoporosis who has been homeless for the last year and has been moving between different shelters in peoples homes. Patient presented to the ED on 10/24 with complaint of right anterior rib pain on deep inspiration ongoing for 4 to 5 days. CTA of the chest was showed right eighth and ninth breath fracture, displaced mid sternal body fracture with large right-sided hemothorax.  Hemoglobin of 10.3 which down from 13.5 in September.  Patient was admitted to hospitalist service. Seen by trauma surgery and CT surgery.   Patient underwent chest tube placement on 10/25.   See below for details.    Subjective: Patient was seen and examined this morning. Lying down in bed.  Not in distress. Not on supplemental oxygen. Tearful while talking about her homelessness.  Assessment/Plan: Hemothorax in the setting of acute right 8 to 9 rib fracture and displaced mid sternal body fracture -On 10/25, patient underwent chest tube placement by trauma surgery.  It was placed on waterseal -CT chest on 10/31 showed persistent moderate to large right-sided pleural effusion and hemothorax. -On 11/1, CT surgery planned to put chest tube but patient refused.  IR was consulted.  Per IR, collection is too dense for chest tube placement.  Recommend VATS or large-bore surgical chest tube placement. -Trauma surgery and CT surgery following.    Acute blood loss anemia -Baseline hemoglobin normal, presented with hemoglobin low at 10.3 due to hemothorax.  Hemoglobin continued to drop to the lowest of 7.3.   -Patient has not been given  transfusion so far.   -Continue to monitor Recent Labs    07/09/21 0633 07/10/21 0413 07/12/21 0948 07/16/21 0323 07/17/21 0348  HGB 8.5* 7.9* 8.5* 7.3* 7.9*  MCV  --  91.6 94.0 90.8 91.0   Acute hypoxia secondary to large hemothorax -Initially required supplemental oxygen.  Currently on room air   Recent fall Chronic T10 compression fracture -Per scans, compression fracture seems worse but patient remains asymptomatic other than her chronic pain.  Neurosurgery consultation was obtained.  No intervention recommended. -She is currently on Robaxin 750 milligram 3 times daily as needed and oxycodone 5 mg every 4 hours as needed.  Reports inadequate pain control.  We will switch Flexeril from PRN to scheduled.    Anxiety/depression - Continue antidepressives, Ativan as needed   Mobility: PT eval pending Living condition: Homeless Goals of care:   Code Status: Full Code  Nutritional status: Body mass index is 18.85 kg/m.     Diet:  Diet Order             Diet regular Room service appropriate? Yes; Fluid consistency: Thin  Diet effective now                  DVT prophylaxis:  enoxaparin (LOVENOX) injection 30 mg Start: 07/09/21 2200 SCDs Start: 07/09/21 0035   Antimicrobials: None Fluid: None Consultants: Trauma surgery, CT surgery, IR Family Communication: None at bedside  Status is: Inpatient  Remains inpatient appropriate because: Seems to need more chest tube  Dispo: The patient is from: Homeless  Anticipated d/c is to: Probably home with shelter              Patient currently is not medically stable to d/c.   Difficult to place patient No     Infusions:    Scheduled Meds:  amphetamine-dextroamphetamine  20 mg Oral BID WC   buPROPion  150 mg Oral QHS   docusate sodium  100 mg Oral BID   enoxaparin (LOVENOX) injection  30 mg Subcutaneous Q12H   methocarbamol  750 mg Oral QID   mirtazapine  15 mg Oral QHS   nicotine  14 mg Transdermal  Daily   polyethylene glycol  17 g Oral Daily   traZODone  300 mg Oral QHS   venlafaxine XR  150 mg Oral Q breakfast    PRN meds: acetaminophen, ipratropium-albuterol, LORazepam, morphine injection, oxyCODONE   Antimicrobials: Anti-infectives (From admission, onward)    None       Objective: Vitals:   07/17/21 0757 07/17/21 1116  BP: 127/64 (!) 118/57  Pulse: 79 85  Resp: 18 18  Temp: 98.4 F (36.9 C) 98.5 F (36.9 C)  SpO2: 95% 94%   No intake or output data in the 24 hours ending 07/17/21 1319  Filed Weights   07/08/21 1958  Weight: 56.2 kg   Weight change:  Body mass index is 18.85 kg/m.   Physical Exam: General exam: Pleasant, elderly, Skin: No rashes, lesions or ulcers.  Multiple areas of bruising on the skin HEENT: Atraumatic, normocephalic, no obvious bleeding Lungs: Chest tube on the right.  Clear to auscultation bilaterally CVS: Regular rate and rhythm, no murmur GI/Abd soft, nontender, nondistended, bowel sound present CNS: Alert, awake, oriented x3 Psychiatry: Tearful while talking about homelessness Extremities: No pedal edema, no calf tenderness  Data Review: I have personally reviewed the laboratory data and studies available.  F/u labs ordered Unresulted Labs (From admission, onward)     Start     Ordered   07/18/21 0500  Basic metabolic panel  Tomorrow morning,   R       Question:  Specimen collection method  Answer:  Lab=Lab collect   07/17/21 1319   07/17/21 0500  CBC with Differential/Platelet  Daily,   R     Question:  Specimen collection method  Answer:  Lab=Lab collect   07/16/21 1350            Signed, Lorin Glass, MD [facility] 07/17/2021

## 2021-07-17 NOTE — Plan of Care (Signed)

## 2021-07-17 NOTE — Progress Notes (Signed)
Interventional Radiology consulted for possible right chest tube placement. Patient has a chest tube placed by surgical team 07/09/21  for a right hemothorax following a fall. CT chest 07/15/21 showed persistent moderate to large right-sided pleural effusion and hemothorax and IR was asked to evaluate patient for additional chest tube placement. Dr. Archer Asa reviewed the images and the collection has been deemed too dense for even our large chest tube to be effective. IR recommends VATS or large bore surgical chest tube placement to evacuate the hemorrhage.   The order will for chest tube placement will be deleted; no IR procedure planned.   Alwyn Ren, [place] [phone] 07/17/2021, 9:41 AM

## 2021-07-17 NOTE — Progress Notes (Signed)
Physical Therapy Treatment Patient Details Name: [name] MRN: [id number] DOB: Jun 21, 1951 Today's Date: 07/17/2021   History of Present Illness 70 yo female presents to [facility] on 07/08/21 with bilat rib pain x4 days, fall x1 week ago. CT shows R rib 8-9 fx, R hemothorax, sternal fracture, chronic thoracic spine fractures. Chest tube placed 10/25. PMH includes anxiety, OA, asthma, depression, HTN, OP, R shoulder replacement, L hip pinning 2013, L TKR 2021, smoker, previous C spine surgeries. Multiple ED visits.    PT Comments    Pt initially refusing ambulating in the hallway reporting she just got done washing her hair and was tired.  She was agreeable after therapist convinced her it would help take her mind off of her worries to do some walking and get out of the room.  She later verbalized she knew she had to walk to get better.  She reported this past summer she would always get very short winded walking, but would recover quickly with seated rest.  She self reports memory issues and does not remember how she could have broken her ribs/sternum.   I reported to her the chart mentioned falls.  PT will continue to follow acutely for safe mobility progression.   Recommendations for follow up therapy are one component of a multi-disciplinary discharge planning process, led by the attending physician.  Recommendations may be updated based on patient status, additional functional criteria and insurance authorization.  Follow Up Recommendations  Skilled nursing-short term rehab (<3 hours/day)     Assistance Recommended at Discharge Frequent or constant Supervision/Assistance  Equipment Recommendations  Other (comment);Rollator (4 wheels) (needs a new rollator, hers is broken)    Recommendations for Other Services       Precautions / Restrictions Precautions Precautions: Fall;Other (comment) Precaution Comments: R pigtail chest tube to water seal     Mobility  Bed Mobility Overal bed  mobility: Needs Assistance Bed Mobility: Supine to Sit     Supine to sit: Supervision     General bed mobility comments: extra time and effort needed to come to sitting EOB, heavy use of railing, supervision for safety.    Transfers Overall transfer level: Needs assistance Equipment used: Rollator (4 wheels) Transfers: Sit to/from Stand Sit to Stand: Min guard           General transfer comment: Min guard assist for safety    Ambulation/Gait Ambulation/Gait assistance: Min guard Gait Distance (Feet): 60 Feet (x2) Assistive device: Rollator (4 wheels) Gait Pattern/deviations: Step-through pattern;Decreased stride length;Trunk flexed;Narrow base of support     General Gait Details: Min guard assist for safety and balance, seated rest half way through gait, DOE 2/4, O2 sats 90% during mobility, 92% at rest on RA.   Stairs             Wheelchair Mobility    Modified Rankin (Stroke Patients Only)       Balance Overall balance assessment: Needs assistance Sitting-balance support: No upper extremity supported;Feet supported Sitting balance-Leahy Scale: Good     Standing balance support: Bilateral upper extremity supported;Single extremity supported Standing balance-Leahy Scale: Poor Standing balance comment: reliant on external assist                            Cognition Arousal/Alertness: Awake/alert Behavior During Therapy: Restless;Impulsive Overall Cognitive Status: No family/caregiver present to determine baseline cognitive functioning  General Comments: Pt continues to frequently cry during our session, she switches to being mad and then back to being pleasant.  She is worried about making the right decisions for her own care.  She is in constant motion, moving around in the bed, wiggling in the chair, feet ar always moving.        Exercises      General Comments        Pertinent  Vitals/Pain Pain Assessment: Faces Faces Pain Scale: Hurts even more Pain Location: chest wall, R ribs Pain Descriptors / Indicators: Sore;Discomfort;Grimacing;Guarding Pain Intervention(s): Limited activity within patient's tolerance;Monitored during session;Repositioned;Other (comment) (educated on splinting with pillow to cough)    Home Living                          Prior Function            PT Goals (current goals can now be found in the care plan section) Progress towards PT goals: Progressing toward goals    Frequency    Min 3X/week      PT Plan Current plan remains appropriate    Co-evaluation              AM-PAC PT "6 Clicks" Mobility   Outcome Measure  Help needed turning from your back to your side while in a flat bed without using bedrails?: A Little Help needed moving from lying on your back to sitting on the side of a flat bed without using bedrails?: A Little Help needed moving to and from a bed to a chair (including a wheelchair)?: A Little Help needed standing up from a chair using your arms (e.g., wheelchair or bedside chair)?: A Little Help needed to walk in hospital room?: A Little Help needed climbing 3-5 steps with a railing? : A Little 6 Click Score: 18    End of Session   Activity Tolerance: Patient limited by pain Patient left: in chair;with call bell/phone within reach;with chair alarm set Nurse Communication: Mobility status PT Visit Diagnosis: Other abnormalities of gait and mobility (R26.89);Muscle weakness (generalized) (M62.81)     Time: 3335-4562 PT Time Calculation (min) (ACUTE ONLY): 30 min  Charges:  $Gait Training: 23-37 mins                     Corinna Capra, PT, DPT  Acute Rehabilitation Ortho Tech Supervisor [phone] pager [phone] office

## 2021-07-17 NOTE — Progress Notes (Signed)
Trauma/Critical Care Follow Up Note  Subjective:    Overnight Issues:   Objective:  Vital signs for last 24 hours: Temp:  [98 F (36.7 C)-99.3 F (37.4 C)] 98.4 F (36.9 C) (11/02 0757) Pulse Rate:  [79-93] 79 (11/02 0757) Resp:  [14-20] 18 (11/02 0757) BP: (115-127)/(50-82) 127/64 (11/02 0757) SpO2:  [94 %-98 %] 95 % (11/02 0757)  Hemodynamic parameters for last 24 hours:    Intake/Output from previous day: 11/01 0701 - 11/02 0700 In: -  Out: 30 [Chest Tube:30]  Intake/Output this shift: No intake/output data recorded.  Vent settings for last 24 hours:    Physical Exam:  Gen: comfortable, no distress Neuro: non-focal exam HEENT: PERRL Neck: supple CV: RRR Pulm: unlabored breathing, CT on WS, no AL Abd: soft, NT GU: clear yellow urine Extr: wwp, no edema    Results for orders placed or performed during the hospital encounter of 07/08/21 (from the past 24 hour(s))  CBC with Differential/Platelet     Status: Abnormal   Collection Time: 07/17/21  3:48 AM  Result Value Ref Range   WBC 11.6 (H) 4.0 - 10.5 K/uL   RBC 2.68 (L) 3.87 - 5.11 MIL/uL   Hemoglobin 7.9 (L) 12.0 - 15.0 g/dL   HCT 44.3 (L) 15.4 - 00.8 %   MCV 91.0 80.0 - 100.0 fL   MCH 29.5 26.0 - 34.0 pg   MCHC 32.4 30.0 - 36.0 g/dL   RDW 67.6 19.5 - 09.3 %   Platelets 742 (H) 150 - 400 K/uL   nRBC 0.0 0.0 - 0.2 %   Neutrophils Relative % 66 %   Neutro Abs 7.7 1.7 - 7.7 K/uL   Lymphocytes Relative 15 %   Lymphs Abs 1.7 0.7 - 4.0 K/uL   Monocytes Relative 11 %   Monocytes Absolute 1.2 (H) 0.1 - 1.0 K/uL   Eosinophils Relative 7 %   Eosinophils Absolute 0.8 (H) 0.0 - 0.5 K/uL   Basophils Relative 1 %   Basophils Absolute 0.1 0.0 - 0.1 K/uL   Immature Granulocytes 0 %   Abs Immature Granulocytes 0.05 0.00 - 0.07 K/uL    Assessment & Plan:  Present on Admission:  Hemothorax  Depression    LOS: 8 days   Additional comments:I reviewed the patient's new clinical lab test results.   and I  reviewed the patients new imaging test results.    GLF   R rib fx 8-9 - pain control, IS/pulm toilet R HTX - Output 30/24h, but night shift not documented. CXR with what appears to be a loculated pleural effusion. CTS c/s, Dr. Cliffton Asters and plan for second CT 11/1, but patient refused. No plans per IR for tube placement. Defer management to CTS.  Sternal fx - pain control, pulm toilet Worsened T10 compression fx - NSGY c/s, Dr. Jordan Likes. No brace needed Wheezing - duoneb prn. Hx tobacco use ABL anemia - hgb stable FEN - Reg, add colace/miralax DVT - lovenox 30mg  BID  ID - none Dispo - therapies. SNF when acute issues resolved  I had a lengthy discussion with the patient today regarding the indication for chest tube placement. She states she was "scared and anxious" as the reason why she refused tube placement yesterday. Counseled on the indication being a loculated effusion not adequately drained by the existing tube. Patient verbalizes understanding and is currently agreeable to tube placement. Loculated HTX to be managed by CTS, Trauma will sign off. Please re-consult if any additional needs arise.  Diamantina Monks, MD Trauma & General Surgery Please use [email or web address] to contact on call provider  07/17/2021  *Care during the described time interval was provided by me. I have reviewed this patient's available data, including medical history, events of note, physical examination and test results as part of my evaluation.

## 2021-07-18 ENCOUNTER — Inpatient Hospital Stay (HOSPITAL_COMMUNITY): Payer: Medicare Other

## 2021-07-18 ENCOUNTER — Encounter (HOSPITAL_COMMUNITY): Payer: Self-pay | Admitting: Anesthesiology

## 2021-07-18 ENCOUNTER — Encounter (HOSPITAL_COMMUNITY)
Admission: EM | Disposition: A | Discharge status: Skilled Nursing Facility | Payer: Self-pay | Source: Home / Self Care | Attending: Internal Medicine

## 2021-07-18 LAB — CBC WITH DIFFERENTIAL/PLATELET
Abs Immature Granulocytes: 0.08 10*3/uL — ABNORMAL HIGH (ref 0.00–0.07)
Basophils Absolute: 0.1 10*3/uL (ref 0.0–0.1)
Basophils Relative: 1 %
Eosinophils Absolute: 0.5 10*3/uL (ref 0.0–0.5)
Eosinophils Relative: 5 %
HCT: 22.8 % — ABNORMAL LOW (ref 36.0–46.0)
Hemoglobin: 7.2 g/dL — ABNORMAL LOW (ref 12.0–15.0)
Immature Granulocytes: 1 %
Lymphocytes Relative: 21 %
Lymphs Abs: 2.3 10*3/uL (ref 0.7–4.0)
MCH: 28.7 pg (ref 26.0–34.0)
MCHC: 31.6 g/dL (ref 30.0–36.0)
MCV: 90.8 fL (ref 80.0–100.0)
Monocytes Absolute: 1.2 10*3/uL — ABNORMAL HIGH (ref 0.1–1.0)
Monocytes Relative: 11 %
Neutro Abs: 7 10*3/uL (ref 1.7–7.7)
Neutrophils Relative %: 61 %
Platelets: 807 10*3/uL — ABNORMAL HIGH (ref 150–400)
RBC: 2.51 MIL/uL — ABNORMAL LOW (ref 3.87–5.11)
RDW: 14.2 % (ref 11.5–15.5)
WBC: 11.2 10*3/uL — ABNORMAL HIGH (ref 4.0–10.5)
nRBC: 0 % (ref 0.0–0.2)

## 2021-07-18 LAB — BASIC METABOLIC PANEL
Anion gap: 8 (ref 5–15)
BUN: 17 mg/dL (ref 8–23)
CO2: 26 mmol/L (ref 22–32)
Calcium: 8.4 mg/dL — ABNORMAL LOW (ref 8.9–10.3)
Chloride: 101 mmol/L (ref 98–111)
Creatinine, Ser: 0.63 mg/dL (ref 0.44–1.00)
GFR, Estimated: >60 mL/min (ref >60)
Glucose, Bld: 115 mg/dL — ABNORMAL HIGH (ref 70–99)
Potassium: 4.1 mmol/L (ref 3.5–5.1)
Sodium: 135 mmol/L (ref 135–145)

## 2021-07-18 SURGERY — VIDEO ASSISTED THORACOSCOPY (VATS)/DECORTICATION
Anesthesia: General | Laterality: Right

## 2021-07-18 MED ORDER — HYDROCORTISONE 1 % EX CREA
1.0000 "application " | TOPICAL_CREAM | Freq: Three times a day (TID) | CUTANEOUS | Status: DC | PRN
  Filled 2021-07-18: qty 28

## 2021-07-18 NOTE — Progress Notes (Signed)
     [address]       [place] [address]             [phone]       Pt states that she does not want surgery Will sign off  Rhyanna Sorce O Dreshawn Hendershott

## 2021-07-18 NOTE — Anesthesia Preprocedure Evaluation (Deleted)
Anesthesia Evaluation    Reviewed: Allergy & Precautions, Patient's Chart, lab work & pertinent test results  Airway        Dental  (+)    Pulmonary asthma , Current Smoker,  Still smoking 1/2ppd, 38 pack year history  Inhalers: albuterol- last used weeks ago    (-) decreased breath sounds      Cardiovascular hypertension, + dysrhythmias (palpitations) + Valvular Problems/Murmurs MVP   MVP on echo done 20-30 years ago, has not followed up since then  Hypertensive today to 150s systolic, does not take BP meds- states she never has needed them   Neuro/Psych PSYCHIATRIC DISORDERS Anxiety Depression negative neurological ROS     GI/Hepatic Neg liver ROS, GERD  Controlled and Medicated,  Endo/Other  negative endocrine ROS  Renal/GU negative Renal ROS  negative genitourinary   Musculoskeletal  (+) Arthritis , Osteoarthritis,    Abdominal   Peds  Hematology negative hematology ROS (+) anemia ,   Anesthesia Other Findings   Reproductive/Obstetrics negative OB ROS                             Anesthesia Physical  Anesthesia Plan  ASA: 4  Anesthesia Plan:    Post-op Pain Management:    Induction:   PONV Risk Score and Plan: 2 and 4 or greater and Ondansetron, Dexamethasone and Treatment may vary due to age or medical condition  Airway Management Planned: Double Lumen EBT  Additional Equipment:   Intra-op Plan:   Post-operative Plan: Possible Post-op intubation/ventilation  Informed Consent:   Plan Discussed with: CRNA and Anesthesiologist  Anesthesia Plan Comments:         Anesthesia Quick Evaluation

## 2021-07-18 NOTE — TOC Progression Note (Addendum)
Transition of Care [facility]) - Progression Note    Patient Details  Name: [name] MRN: [id number] Date of Birth: 1951/01/11  Transition of Care [facility]) CM/SW [place], [place] Phone Number: 07/18/2021, 2:33 PM  Clinical Narrative:     Patient has no confirmed bed offers due to homelessness. TOC appreciates therapy efforts to work with patient while in hospital.    CSW met with patient at bedside. Patient informed of possible bed offer with [facility]. CSW also reminded patient of potential barrier for  SNF placement and the need to explore other discharge options.  CSW contacted [facility], LVM  with admission to confirmed possible bed offer. CSW waiting on call back.  CSW will continue to follow and assist with discharge planning.  Thurmond Butts, MSW, LCSW Clinical Social Worker    Expected Discharge Plan: Skilled Nursing Facility Barriers to Discharge: Continued Medical Work up, SNF Pending bed offer (homeless)  Expected Discharge Plan and Services Expected Discharge Plan: [place] In-house Referral: Clinical Social Work     Living arrangements for the past 2 months: Hotel/Motel, Homeless Shelter                                       Social Determinants of Health (SDOH) Interventions    Readmission Risk Interventions No flowsheet data found.

## 2021-07-18 NOTE — Progress Notes (Signed)
Physical Therapy Treatment Patient Details Name: [name] MRN: [id number] DOB: [birth year] Today's Date: 07/18/2021   History of Present Illness 70 yo female [facility]21 with bilat rib pain x4 days, fall x1 week ago. CT shows R rib 8-9 fx, R hemothorax, sternal fracture, chronic thoracic spine fractures. Chest tube placed 10/25, d/c 11/3. PMH includes anxiety, OA, asthma, depression, HTN, OP, R shoulder replacement, L hip pinning 2013, L TKR 2021, smoker, previous C spine surgeries. Multiple ED visits.    PT Comments    Pt remains impulsive, at high risk for falling, with limited, but improving endurance.  Pt was able to walk a short distance down the hallway with PT (immediately after OT session) and participate in seated exercises and balance practice.  She would benefit from post acute therapy to improve her strength, endurance, and balance before d/c to independent living environment to help reduce her risk of falling, injury and re-admission.   PT to follow acutely for deficits listed below.      Recommendations for follow up therapy are one component of a multi-disciplinary discharge planning process, led by the attending physician.  Recommendations may be updated based on patient status, additional functional criteria and insurance authorization.  Follow Up Recommendations  Skilled nursing-short term rehab (<3 hours/day)     Assistance Recommended at Discharge Frequent or constant Supervision/Assistance  Equipment Recommendations  Rollator (4 wheels)    Recommendations for Other Services       Precautions / Restrictions Precautions Precautions: Fall     Mobility  Bed Mobility Overal bed mobility: Needs Assistance Bed Mobility: Supine to Sit       Sit to supine: Supervision   General bed mobility comments: Supervision for safety, use of rail for transitions.    Transfers Overall transfer level: Needs assistance Equipment used: Rollator (4  wheels) Transfers: Sit to/from Stand Sit to Stand: Min guard;Min assist           General transfer comment: Up to min assist to stand depending on height of surface.  Cues for safety during transitions related to rollator use (lock breaks, take rollator all the way around to sitting surface, reach back for armrests, push up from armrests).    Ambulation/Gait Ambulation/Gait assistance: Min guard Gait Distance (Feet): 45 Feet (x2, 3 min seated rest due to DOE, O2 sats 94% on RA) Assistive device: Rollator (4 wheels) Gait Pattern/deviations: Step-through pattern;Decreased stride length;Trunk flexed;Narrow base of support Gait velocity: decreased Gait velocity interpretation: <1.8 ft/sec, indicate of risk for recurrent falls     Stairs             Wheelchair Mobility    Modified Rankin (Stroke Patients Only)       Balance Overall balance assessment: Needs assistance Sitting-balance support: Feet supported;No upper extremity supported Sitting balance-Leahy Scale: Good     Standing balance support: Bilateral upper extremity supported;Single extremity supported Standing balance-Leahy Scale: Poor Standing balance comment: reliant on external assist         Rhomberg - Eyes Opened: 30 (min guard hand held assist) Rhomberg - Eyes Closed: 10 (min assist, poor sensation in bil feet, almost immediately fell over)                Cognition Arousal/Alertness: Awake/alert Behavior During Therapy: Restless;Anxious;Impulsive (labile) Overall Cognitive Status: No family/caregiver present to determine baseline cognitive functioning Area of Impairment: Memory;Safety/judgement;Awareness  Current Attention Level: Selective Memory: Decreased short-term memory   Safety/Judgement: Decreased awareness of safety;Decreased awareness of deficits Awareness: Emergent   General Comments: Pt repmains impulsive, restless, anxious.  Despite repeated verbal  cues for safe rollator use, she continues to have unsafe transitions, make poor decisions related to safety and balance.        Exercises General Exercises - Upper Extremity Shoulder Flexion: AROM;Both;5 reps (very limited shoulder flexion ROM, so only did 5 reps, h/o bil shoulder surgery) General Exercises - Lower Extremity Ankle Circles/Pumps: AROM;Both;20 reps Long Arc Quad: AROM;Both;10 reps Hip Flexion/Marching: AROM;Both;10 reps    General Comments General comments (skin integrity, edema, etc.): Pt limited by DOE, however, remained 94% on RA during mobility and does not have chest tube today.      Pertinent Vitals/Pain Pain Assessment: Faces Faces Pain Scale: Hurts even more Pain Location: chest wall, R ribs Pain Descriptors / Indicators: Sore;Discomfort;Grimacing;Guarding Pain Intervention(s): Limited activity within patient's tolerance;Monitored during session;Repositioned    Home Living                          Prior Function            PT Goals (current goals can now be found in the care plan section) Acute Rehab PT Goals Patient Stated Goal: she wants to feel better, get better Progress towards PT goals: Progressing toward goals    Frequency    Min 3X/week      PT Plan Current plan remains appropriate    Co-evaluation              AM-PAC PT "6 Clicks" Mobility   Outcome Measure  Help needed turning from your back to your side while in a flat bed without using bedrails?: A Little Help needed moving from lying on your back to sitting on the side of a flat bed without using bedrails?: A Little Help needed moving to and from a bed to a chair (including a wheelchair)?: A Little Help needed standing up from a chair using your arms (e.g., wheelchair or bedside chair)?: A Little Help needed to walk in hospital room?: A Little Help needed climbing 3-5 steps with a railing? : A Lot 6 Click Score: 17    End of Session Equipment Utilized  During Treatment: Gait belt Activity Tolerance: Patient limited by pain Patient left: in bed;with call bell/phone within reach;with bed alarm set   PT Visit Diagnosis: Other abnormalities of gait and mobility (R26.89);Muscle weakness (generalized) (M62.81)     Time: 5993-5701 PT Time Calculation (min) (ACUTE ONLY): 25 min  Charges:  $Gait Training: 8-22 mins $Therapeutic Exercise: 8-22 mins                     Corinna Capra, PT, DPT  Acute Rehabilitation Ortho Tech Supervisor [phone] pager [phone]) [phone] office

## 2021-07-18 NOTE — Progress Notes (Signed)
PROGRESS NOTE  [name]  DOB: 14-Jan-1951  PCP: Paulino Door, MD YBO:[id number]  DOA: 07/08/2021  LOS: 9 days  Hospital Day: 11  Chief Complaint  Patient presents with   Chest Pain    Brief narrative: [name] is a 70 y.o. female with PMH significant for HTN, HLD, chronic smoking, mitral valve prolapse, palpitation, anxiety/depression, asthma, chronic pain, osteopenia, osteoporosis who has been homeless for the last year and has been moving between different shelters in peoples homes. Patient presented to the ED on 10/24 with complaint of right anterior rib pain on deep inspiration ongoing for 4 to 5 days. CTA of the chest was showed right eighth and ninth breath fracture, displaced mid sternal body fracture with large right-sided hemothorax.  Hemoglobin of 10.3 which down from 13.5 in September.  Patient was admitted to hospitalist service. Seen by trauma surgery and CT surgery.   Patient underwent chest tube placement on 10/25.   See below for details.    Subjective: Patient was seen and examined this morning. Lying down in bed.  Not in distress.  She was tentatively scheduled for a large bore chest tube placement by CT surgery in the OR today.  However patient refused. Per CT surgery recommendation, the pre-existing chest tube was pulled out. Patient feels better after chest tubes pulled out.  Not on oxygen. She states she does not have any vertigo after discharge.  Assessment/Plan: Hemothorax in the setting of acute right 8 to 9 rib fracture and displaced mid sternal body fracture -On 10/25, patient underwent chest tube placement by trauma surgery.  It was placed on waterseal  -CT chest on 10/31 showed persistent moderate to large right-sided pleural effusion and hemothorax. -On 11/1, CT surgery planned to put chest tube but patient refused.  IR was consulted.  Per IR, collection is too dense for chest tube placement.  -Original chest tube pulled out today.  Repeat  chest x-ray continue to monitor  Acute blood loss anemia -Baseline hemoglobin normal, presented with hemoglobin low at 10.3 due to hemothorax.  Hemoglobin is currently running between 7 and 8 for last 3 days.  She has not required transfusion so far. -Continue to monitor Recent Labs    07/10/21 0413 07/12/21 0948 07/16/21 0323 07/17/21 0348 07/18/21 0305  HGB 7.9* 8.5* 7.3* 7.9* 7.2*  MCV 91.6 94.0 90.8 91.0 90.8   Acute hypoxia secondary to large hemothorax -Initially required supplemental oxygen.  Currently on room air   Recent fall Chronic T10 compression fracture -Per scans, compression fracture seems worse but patient remains asymptomatic other than her chronic pain.  Neurosurgery consultation was obtained.  No intervention recommended. -She is currently on oxycodone as needed and Robaxin     Anxiety/depression - Continue antidepressives, Ativan as needed   Mobility: PT eval pending Living condition: Homeless Goals of care:   Code Status: Full Code  Nutritional status: Body mass index is 18.85 kg/m.     Diet:  Diet Order             Diet regular Room service appropriate? Yes; Fluid consistency: Thin  Diet effective now                  DVT prophylaxis:  enoxaparin (LOVENOX) injection 30 mg Start: 07/09/21 2200 SCDs Start: 07/09/21 0035   Antimicrobials: None Fluid: None Consultants: Trauma surgery, CT surgery, IR Family Communication: None at bedside  Status is: Inpatient  Remains inpatient appropriate because: Seems to need more chest tube  Dispo: The patient is from: Homeless              Anticipated d/c is to: SNF recommended by PT              Patient currently is not medically stable to d/c.   Difficult to place patient No     Infusions:    Scheduled Meds:  amphetamine-dextroamphetamine  20 mg Oral BID WC   buPROPion  150 mg Oral QHS   docusate sodium  100 mg Oral BID   enoxaparin (LOVENOX) injection  30 mg Subcutaneous Q12H    methocarbamol  750 mg Oral QID   mirtazapine  15 mg Oral QHS   nicotine  14 mg Transdermal Daily   polyethylene glycol  17 g Oral Daily   traZODone  300 mg Oral QHS   venlafaxine XR  150 mg Oral Q breakfast    PRN meds: acetaminophen, diphenhydrAMINE, ipratropium-albuterol, LORazepam, oxyCODONE   Antimicrobials: Anti-infectives (From admission, onward)    None       Objective: Vitals:   07/18/21 0836 07/18/21 1136  BP: 119/63 120/77  Pulse: 89 84  Resp: 16 18  Temp: 98 F (36.7 C) 98.1 F (36.7 C)  SpO2: 94% 97%    Intake/Output Summary (Last 24 hours) at 07/18/2021 1339 Last data filed at 07/18/2021 1240 Gross per 24 hour  Intake 240 ml  Output --  Net 240 ml    Filed Weights   07/08/21 1958  Weight: 56.2 kg   Weight change:  Body mass index is 18.85 kg/m.   Physical Exam: General exam: Pleasant, elderly, Skin: No rashes, lesions or ulcers.   HEENT: Atraumatic, normocephalic, no obvious bleeding Lungs: Chest tube pulled out today.  Clear to auscultation bilaterally CVS: Regular rate and rhythm, no murmur GI/Abd soft, nontender, nondistended, bowel sound present CNS: Alert, awake, oriented x3 Psychiatry: Tearful while talking about homelessness Extremities: No pedal edema, no calf tenderness  Data Review: I have personally reviewed the laboratory data and studies available.  F/u labs ordered Unresulted Labs (From admission, onward)     Start     Ordered   07/17/21 0500  CBC with Differential/Platelet  Daily,   R     Question:  Specimen collection method  Answer:  Lab=Lab collect   07/16/21 1350            Signed, Lorin Glass, MD [facility] 07/18/2021

## 2021-07-18 NOTE — TOC Progression Note (Signed)
Transition of Care [facility]) - Progression Note    Patient Details  Name: STEFFANIE MINGLE MRN: [id number] Date of Birth: 1950-10-10  [facility]) CM/SW Contact  Eduard [place], [place] Phone Number: 07/18/2021, 3:30 PM  Clinical Narrative:     [facility] has confirmed bed offer. Informed SNF anticipated d/c tomorrow. CSW updated RN- requested covid test. CSW will start insurance authorization.  CSW will continue to follow and assist with discharge planning.   Antony Blackbird, MSW, LCSW Clinical Social Worker    Expected Discharge Plan: Skilled Nursing Facility Barriers to Discharge: Continued Medical Work up, SNF Pending bed offer (homeless)  Expected Discharge Plan and Services Expected Discharge Plan: Skilled Nursing Facility In-house Referral: Clinical Social Work     Living arrangements for the past 2 months: Hotel/Motel, Homeless Shelter                                       Social Determinants of Health (SDOH) Interventions    Readmission Risk Interventions No flowsheet data found.

## 2021-07-18 NOTE — TOC Progression Note (Signed)
Transition of Care [facility]) - Progression Note    Patient Details  Name: [name] MRN: [id number] Date of Birth: 1951/04/28  Transition of Care [facility]) CM/SW Contact  Eduard R[place], [place] Phone Number: 07/18/2021, 4:08 PM  Clinical Narrative:     Received insurance approval - reference # [id number] from 11/04-11/08.  Antony Blackbird, MSW, LCSW Clinical Social Worker    Expected Discharge Plan: Skilled Nursing Facility Barriers to Discharge: Continued Medical Work up, SNF Pending bed offer (homeless)  Expected Discharge Plan and Services Expected Discharge Plan: Skilled Nursing Facility In-house Referral: Clinical Social Work     Living arrangements for the past 2 months: Hotel/Motel, Homeless Shelter                                       Social Determinants of Health (SDOH) Interventions    Readmission Risk Interventions No flowsheet data found.

## 2021-07-18 NOTE — Plan of Care (Signed)

## 2021-07-18 NOTE — Progress Notes (Signed)
Occupational Therapy Treatment Note  Pt requires assistance for ADL and mobility and remains a high fall risk. Feel pt would benefit from rehab at [facility]     07/18/21 1542  OT Visit Information  Last OT Received On 07/18/21  Assistance Needed +1  History of Present Illness 70 yo female presents to [facility] on 07/08/21 with bilat rib pain x4 days, fall x1 week ago. CT shows R rib 8-9 fx, R hemothorax, sternal fracture, chronic thoracic spine fractures. Chest tube placed 10/25, d/c 11/3. PMH includes anxiety, OA, asthma, depression, HTN, OP, R shoulder replacement, L hip pinning 2013, L TKR 2021, smoker, previous C spine surgeries. Multiple ED visits.  Precautions  Precautions Fall  Pain Assessment  Pain Assessment Faces  Faces Pain Scale 6  Pain Location chest wall, R ribs  Pain Descriptors / Indicators Sore;Discomfort;Grimacing;Guarding  Pain Intervention(s) Limited activity within patient's tolerance  Cognition  Arousal/Alertness Awake/alert  Behavior During Therapy Restless;Anxious;Impulsive (labile)  Overall Cognitive Status No family/caregiver present to determine baseline cognitive functioning  Area of Impairment Memory;Safety/judgement;Awareness  Current Attention Level Selective  Memory Decreased short-term memory  Safety/Judgement Decreased awareness of safety;Decreased awareness of deficits  Awareness Emergent  General Comments Pt repmains impulsive, restless, anxious.  Despite repeated verbal cues for safe rollator use, she continues to have unsafe transitions, make poor decisions related to safety and balance. trying to sit with rollaotr moving.  Upper Extremity Assessment  Upper Extremity Assessment  (B RTC insufficiency making grooming/brushing hair difficult. Pt places her elbow on rollator handle to increase shoulder flex ROM to complete grooming)  ADL  Overall ADL's  Needs assistance/impaired  Grooming Minimal assistance  Grooming Details (indicate cue type and reason)  hair  Upper Body Bathing Set up;Supervision/ safety;Sitting  Lower Body Bathing Minimal assistance;Sit to/from stand  Upper Body Dressing  Minimal assistance;Sitting  Lower Body Dressing Minimal assistance;Sit to/from Scientist, research (life sciences) Minimal assistance (rollator)  Toileting- IT trainer Min guard  Functional mobility during ADLs Min guard;Rollator (4 wheels)  Bed Mobility  Overal bed mobility Needs Assistance  Bed Mobility Supine to Sit  Sit to supine Supervision  General bed mobility comments Supervision for safety, use of rail for transitions.  Transfers  Overall transfer level Needs assistance  Equipment used Rollator (4 wheels)  Transfers Sit to/from Stand  Sit to Stand Min guard;Min assist  General transfer comment Up to min assist to stand depending on height of surface.  Cues for safety during transitions related to rollator use (lock breaks, take rollator all the way around to sitting surface, reach back for armrests, push up from armrests).  Balance  Overall balance assessment Needs assistance  Sitting-balance support Feet supported;No upper extremity supported  Sitting balance-Leahy Scale Good  Standing balance support Bilateral upper extremity supported;Single extremity supported  Standing balance-Leahy Scale Poor  Standing balance comment reliant on external assist  Rhomberg - Eyes Opened  (min guard hand held assist)  Rhomberg - Eyes Closed  (min assist, poor sensation in bil feet, almost immediately fell over)  General Comments  General comments (skin integrity, edema, etc.) fatigues easily; SpO2 above 94 on RA during activity  General Exercises - Upper Extremity  Shoulder Flexion  (very limited shoulder flexion ROM, so only did 5 reps, h/o bil shoulder surgery)  OT - End of Session  Equipment Utilized During Treatment Rollator (4 wheels)  Activity Tolerance Patient tolerated treatment well  Patient left Other (comment) (walking with  PT)  Nurse Communication Mobility status  OT Assessment/Plan  OT Plan Discharge plan remains appropriate  OT Visit Diagnosis Unsteadiness on feet (R26.81);Other abnormalities of gait and mobility (R26.89);Muscle weakness (generalized) (M62.81);Pain  Pain - part of body  (chest)  OT Frequency (ACUTE ONLY) Min 2X/week  Follow Up Recommendations Skilled nursing-short term rehab (<3 hours/day)  Assistance recommended at discharge Intermittent Supervision/Assistance  AM-PAC OT "6 Clicks" Daily Activity Outcome Measure (Version 2)  Help from another person eating meals? 4  Help from another person taking care of personal grooming? 3  Help from another person toileting, which includes using toliet, bedpan, or urinal? 3  Help from another person bathing (including washing, rinsing, drying)? 3  Help from another person to put on and taking off regular upper body clothing? 2  Help from another person to put on and taking off regular lower body clothing? 2  6 Click Score 17  Progressive Mobility  What is the highest level of mobility based on the progressive mobility assessment? Level 5 (Walks with assist in room/hall) - Balance while stepping forward/back and can walk in room with assist - Complete  OT Goal Progression  Progress towards OT goals Progressing toward goals  Acute Rehab OT Goals  Patient Stated Goal to have somewhere to go  OT Goal Formulation With patient  Time For Goal Achievement 07/25/21  Potential to Achieve Goals Good  ADL Goals  Pt Will Perform Grooming with modified independence;standing  Pt Will Perform Lower Body Bathing with modified independence;sit to/from stand  Pt Will Perform Upper Body Dressing with modified independence;sitting  Pt Will Perform Lower Body Dressing with modified independence;sit to/from stand  Pt Will Transfer to Toilet with modified independence;ambulating  Additional ADL Goal #1 Pt will verbalize 3 strategies to reduce risk of falls  OT Time  Calculation  OT Start Time (ACUTE ONLY) 1444  OT Stop Time (ACUTE ONLY) 1456  OT Time Calculation (min) 12 min  OT General Charges  $OT Visit 1 Visit  OT Treatments  $Self Care/Home Management  8-22 mins  Luisa Dago, OT/L   Acute OT Clinical Specialist Acute Rehabilitation Services Pager [phone] Office [phone]

## 2021-07-18 NOTE — Progress Notes (Signed)
Patient ID: [name], female   DOB: 11-20-1950, 70 y.o.   MRN: [id number] Day of Surgery   Subjective: Denies SOB ROS negative except as listed above. Objective: Vital signs in last 24 hours: Temp:  [98 F (36.7 C)-99.9 F (37.7 C)] 98 F (36.7 C) (11/03 0836) Pulse Rate:  [85-89] 89 (11/03 0836) Resp:  [16-20] 16 (11/03 0836) BP: (104-135)/(56-78) 119/63 (11/03 0836) SpO2:  [94 %-95 %] 94 % (11/03 0836) Last BM Date: 07/10/21  Intake/Output from previous day: No intake/output data recorded. Intake/Output this shift: No intake/output data recorded.  General appearance: alert and cooperative Resp: clear to auscultation bilaterally and decreased R base Chest wall: chest tube site dressed after tube removed  Lab Results: CBC  Recent Labs    07/17/21 0348 07/18/21 0305  WBC 11.6* 11.2*  HGB 7.9* 7.2*  HCT 24.4* 22.8*  PLT 742* 807*   BMET Recent Labs    07/16/21 0323 07/18/21 0305  NA 135 135  K 3.9 4.1  CL 104 101  CO2 24 26  GLUCOSE 110* 115*  BUN 18 17  CREATININE 0.88 0.63  CALCIUM 8.3* 8.4*   PT/INR No results for input(s): LABPROT, INR in the last 72 hours. ABG No results for input(s): PHART, HCO3 in the last 72 hours.  Invalid input(s): PCO2, PO2  Studies/Results: No results found.  Anti-infectives: Anti-infectives (From admission, onward)    None       Assessment/Plan: GLF   R rib fx 8-9 - pain control, IS/pulm toilet R HTX - refused all intervention by TCTS. D/C chest tube now. CXR this PM. Worsened T10 compression fx - NSGY c/s, Dr. Jordan Likes. No brace needed Wheezing - duoneb prn. Hx tobacco use ABL anemia - hgb stable FEN - Reg, add colace/miralax DVT - lovenox 30mg  BID  ID - none Dispo - therapies. SNF when acute issues resolved   LOS: 9 days    , MD, MPH, FACS Trauma & General Surgery Use [email or web address] to contact on call provider  07/18/2021

## 2021-07-19 LAB — SARS CORONAVIRUS 2 (TAT 6-24 HRS): SARS Coronavirus 2: NEGATIVE

## 2021-07-19 LAB — CBC WITH DIFFERENTIAL/PLATELET
Abs Immature Granulocytes: 0.04 K/uL (ref 0.00–0.07)
Basophils Absolute: 0.1 K/uL (ref 0.0–0.1)
Basophils Relative: 1 %
Eosinophils Absolute: 0.5 K/uL (ref 0.0–0.5)
Eosinophils Relative: 5 %
HCT: 23.8 % — ABNORMAL LOW (ref 36.0–46.0)
Hemoglobin: 7.3 g/dL — ABNORMAL LOW (ref 12.0–15.0)
Immature Granulocytes: 0 %
Lymphocytes Relative: 19 %
Lymphs Abs: 1.8 K/uL (ref 0.7–4.0)
MCH: 28.3 pg (ref 26.0–34.0)
MCHC: 30.7 g/dL (ref 30.0–36.0)
MCV: 92.2 fL (ref 80.0–100.0)
Monocytes Absolute: 1.1 K/uL — ABNORMAL HIGH (ref 0.1–1.0)
Monocytes Relative: 11 %
Neutro Abs: 5.9 K/uL (ref 1.7–7.7)
Neutrophils Relative %: 64 %
Platelets: 848 K/uL — ABNORMAL HIGH (ref 150–400)
RBC: 2.58 MIL/uL — ABNORMAL LOW (ref 3.87–5.11)
RDW: 14.3 % (ref 11.5–15.5)
WBC: 9.4 K/uL (ref 4.0–10.5)
nRBC: 0 % (ref 0.0–0.2)

## 2021-07-19 MED ORDER — LORAZEPAM 1 MG PO TABS: 1.0000 mg | ORAL_TABLET | Freq: Two times a day (BID) | ORAL | 0 refills | Status: AC | PRN

## 2021-07-19 MED ORDER — DOCUSATE SODIUM 100 MG PO CAPS: 100.0000 mg | ORAL_CAPSULE | Freq: Two times a day (BID) | ORAL | 0 refills | Status: DC

## 2021-07-19 MED ORDER — METHOCARBAMOL 750 MG PO TABS: 750.0000 mg | ORAL_TABLET | Freq: Three times a day (TID) | ORAL | 0 refills | Status: AC | PRN

## 2021-07-19 MED ORDER — MIRTAZAPINE 15 MG PO TABS: 15.0000 mg | ORAL_TABLET | Freq: Every day | ORAL | 0 refills | Status: DC

## 2021-07-19 MED ORDER — OXYCODONE HCL 5 MG PO TABS: 5.0000 mg | ORAL_TABLET | ORAL | 0 refills | Status: AC | PRN

## 2021-07-19 MED ORDER — POLYETHYLENE GLYCOL 3350 17 G PO PACK: 17.0000 g | PACK | Freq: Every day | ORAL | 0 refills | Status: DC

## 2021-07-19 MED ORDER — IPRATROPIUM-ALBUTEROL 0.5-2.5 (3) MG/3ML IN SOLN: 3.0000 mL | Freq: Four times a day (QID) | RESPIRATORY_TRACT | Status: DC | PRN

## 2021-07-19 NOTE — Progress Notes (Signed)
[name] was here to pick up the patient for transport, pt is screaming and crying and refusing to go.  Patient stating,"I will not go to a strange place, with strange people, in the middle of the night."  Patient is AxO=4 and refusing.  A/C, [name] was contacted and told us to keep her overnight and try to arrange for transport for daytime, when she is agreeing to go.  [name] was contacted, RN was told we had to speak with someone on the south side of the building and RN was transferred, RN waited on hold for over 20 mins to notify them that the patient will not be coming tonight and that we will rearrange transport in the morning; however the phone call was disconnected and upon trying to recall the facility no one answered the phone.

## 2021-07-19 NOTE — Progress Notes (Signed)
PT Cancellation Note  Patient Details Name: [name] MRN: [id number] DOB: 1951-05-19   Cancelled Treatment:    Reason Eval/Treat Not Completed: Patient declined, no reason specified.  Pt refusing therapy today.  She is too emotionally worked up re: her pending d/c to SNF for rehab.  PT to follow acutely until d/c confirmed.     Thanks,  Corinna Capra, PT, DPT  Acute Rehabilitation Ortho Tech Supervisor [phone] pager #[phone] office      Lurena Joiner B Zacchaeus Halm 07/19/2021, 5:18 PM

## 2021-07-19 NOTE — NC FL2 (Signed)
[place] MEDICAID FL2 LEVEL OF CARE SCREENING TOOL     IDENTIFICATION  Patient Name: [name] Birthdate: [birth year] Sex: female Admission Date (Current Location): 07/08/2021  [facility] and [place] Number:  Producer, television/film/video and Address:  [facility], [address], [place], [place] [address]      Provider Number: [id number]  Attending Physician Name and Address:  Lorin Glass, MD  Relative Name and Phone Number:  Deniece Portela [phone]    Current Level of Care: Hospital Recommended Level of Care: Skilled Nursing Facility Prior Approval Number:    Date Approved/Denied:   PASRR Number: [id number]  Discharge Plan: SNF    Current Diagnoses: Patient Active Problem List   Diagnosis Date Noted   Hemothorax 07/09/2021   Acute respiratory failure with hypoxia (HCC) 07/09/2021   Acute posthemorrhagic anemia 07/09/2021   Compression fracture of T10 vertebra (HCC) 07/09/2021   Cognitive impairment 07/09/2021   Anxiety 07/09/2021   Arthritis of left knee 03/27/2020   Humeral surgical neck fracture 02/27/2012   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/27/2012   Pulmonary nodule 02/27/2012   Drug-seeking behavior 02/25/2011   ROTATOR CUFF REPAIR, RIGHT, HX OF 03/27/2010   HYPERLIPIDEMIA 02/21/2010   NECK PAIN, CHRONIC 11/17/2007   Depression 11/11/2007   OSTEOPOROSIS 11/11/2007    Orientation RESPIRATION BLADDER Height & Weight     Self, Time, Situation, Place  Normal Continent Weight: 124 lb (56.2 kg) Height:  5\' 8"  (172.7 cm)  BEHAVIORAL SYMPTOMS/MOOD NEUROLOGICAL BOWEL NUTRITION STATUS      Continent Diet (Regular)  AMBULATORY STATUS COMMUNICATION OF NEEDS Skin   Independent (Uses Rollator) Verbally Bruising                       Personal Care Assistance Level of Assistance  Bathing, Feeding, Dressing Bathing Assistance: Limited assistance Feeding assistance: Independent Dressing Assistance: Independent     Functional  Limitations Info  Sight, Hearing, Speech Sight Info: Adequate Hearing Info: Impaired Speech Info: Adequate    SPECIAL CARE FACTORS FREQUENCY  PT (By licensed PT), OT (By licensed OT)     PT Frequency: 5x per week OT Frequency: 5x per week            Contractures Contractures Info: Not present    Additional Factors Info  Code Status, Allergies, Psychotropic Code Status Info: Full Allergies Info: Penicillin,Vancomycin Psychotropic Info: see discharge summary         Current Medications (07/19/2021):  This is the current hospital active medication list Current Facility-Administered Medications  Medication Dose Route Frequency Provider Last Rate Last Admin   acetaminophen (TYLENOL) tablet 1,000 mg  1,000 mg Oral Q6H PRN 13/12/2020, PA-C   1,000 mg at 07/18/21 2154   amphetamine-dextroamphetamine (ADDERALL) tablet 20 mg  20 mg Oral BID WC 2155, MD   20 mg at 07/19/21 0951   buPROPion (WELLBUTRIN XL) 24 hr tablet 150 mg  150 mg Oral QHS Tu, Ching T, DO   150 mg at 07/18/21 2155   diphenhydrAMINE (BENADRYL) capsule 25 mg  25 mg Oral QHS PRN 2156, MD   25 mg at 07/19/21 0938   docusate sodium (COLACE) capsule 100 mg  100 mg Oral BID 13/04/22, PA-C   100 mg at 07/19/21 0938   enoxaparin (LOVENOX) injection 30 mg  30 mg Subcutaneous Q12H 13/04/22, MD   30 mg at 07/19/21 0937   hydrocortisone cream 1 % 1  application  1 application Topical TID PRN Dahal, Melina Schools, MD       ipratropium-albuterol (DUONEB) 0.5-2.5 (3) MG/3ML nebulizer solution 3 mL  3 mL Nebulization Q6H PRN Maczis, Elmer Sow, PA-C       LORazepam (ATIVAN) tablet 1 mg  1 mg Oral BID PRN Tu, Ching T, DO   1 mg at 07/18/21 0908   methocarbamol (ROBAXIN) tablet 750 mg  750 mg Oral QID Dahal, Melina Schools, MD   750 mg at 07/19/21 0938   mirtazapine (REMERON) tablet 15 mg  15 mg Oral QHS Tu, Ching T, DO   15 mg at 07/18/21 2155   nicotine (NICODERM CQ - dosed in mg/24 hours) patch 14 mg  14 mg  Transdermal Daily Dorcas Carrow, MD   14 mg at 07/19/21 3570   oxyCODONE (Oxy IR/ROXICODONE) immediate release tablet 5 mg  5 mg Oral Q4H PRN Jacinto Halim, PA-C   5 mg at 07/19/21 1779   polyethylene glycol (MIRALAX / GLYCOLAX) packet 17 g  17 g Oral Daily Jacinto Halim, PA-C   17 g at 07/18/21 2200   traZODone (DESYREL) tablet 300 mg  300 mg Oral QHS Dorcas Carrow, MD   300 mg at 07/18/21 2155   venlafaxine XR (EFFEXOR-XR) 24 hr capsule 150 mg  150 mg Oral Q breakfast Dorcas Carrow, MD   150 mg at 07/19/21 3903     Discharge Medications: Please see discharge summary for a list of discharge medications.  Relevant Imaging Results:  Relevant Lab Results:   Additional Information SS# [id number]/MA has had 1 Covid vaccine  Eduard Roux, LCSW

## 2021-07-19 NOTE — Discharge Summary (Signed)
Physician Discharge Summary  [name] QBV:[id number] DOB: 1951/06/29 DOA: 07/08/2021  PCP: Paulino Door, MD  Admit date: 07/08/2021 Discharge date: 07/19/2021  Admitted From: Homeless Discharge disposition: [facility]   Code Status: Full Code   Discharge Diagnosis:   Principal Problem:   Hemothorax Active Problems:   Depression   Acute respiratory failure with hypoxia (HCC)   Acute posthemorrhagic anemia   Compression fracture of T10 vertebra (HCC)   Cognitive impairment   Anxiety    Chief Complaint  Patient presents with   Chest Pain    Brief narrative: [name] is a 70 y.o. female with PMH significant for HTN, HLD, chronic smoking, mitral valve prolapse, palpitation, anxiety/depression, asthma, chronic pain, osteopenia, osteoporosis who has been homeless for the last year and has been moving between different shelters in peoples homes. Patient presented to the ED on 10/24 with complaint of right anterior rib pain on deep inspiration ongoing for 4 to 5 days. CTA of the chest was showed right eighth and ninth breath fracture, displaced mid sternal body fracture with large right-sided hemothorax.  Hemoglobin of 10.3 which down from 13.5 in September.  Patient was admitted to hospitalist service. Seen by trauma surgery and CT surgery.   Patient underwent chest tube placement on 10/25.   See below for details.    Subjective: Patient was seen and examined this morning.  Tearful.  She felt that she was being kicked out of the hospital.  I had a long conversation about her going to a SNF.  I answered her questions to satisfaction. She agreed to discharge  Hospital course Hemothorax in the setting of acute right 8 to 9 rib fracture and displaced mid sternal body fracture -On 10/25, patient underwent chest tube placement by trauma surgery.  It was placed on waterseal  -CT chest on 10/31 showed persistent moderate to large right-sided pleural  effusion and hemothorax. -On 11/1, CT surgery planned to put chest tube but patient refused.  IR was consulted.  Per IR, collection is too dense for chest tube placement.  -Original chest tube pulled out on 11/3.   -Not in respite distress.  Acute blood loss anemia -Baseline hemoglobin normal, presented with hemoglobin low at 10.3 due to hemothorax.  Hemoglobin is currently running between 7 and 8 for last 4 days.  She has not required transfusion so far. -Continue to monitor Recent Labs    07/12/21 0948 07/16/21 0323 07/17/21 0348 07/18/21 0305 07/19/21 0352  HGB 8.5* 7.3* 7.9* 7.2* 7.3*  MCV 94.0 90.8 91.0 90.8 92.2   Acute hypoxia secondary to large hemothorax -Initially required supplemental oxygen.  Currently on room air   Recent fall Chronic T10 compression fracture -Per scans, compression fracture seems worse but patient remains asymptomatic other than her chronic pain.  Neurosurgery consultation was obtained.  No intervention recommended. -She is currently on oxycodone as needed and Robaxin     Anxiety/depression -Continue antidepressives, Ativan as needed   Allergies as of 07/19/2021       Reactions   Penicillins Rash, Other (See Comments)   And, from childhood: Had at 70 yrs old and fainted ("with the sight of the large needle.") Has tolerated oral cephalexin and intraoperative cefazolin without issues.     Vancomycin Rash, Other (See Comments)   "I turned red"        Medication List     STOP taking these medications    aspirin 81 MG chewable tablet   HYDROcodone-acetaminophen  5-325 MG tablet Commonly known as: NORCO/VICODIN   ibuprofen 200 MG tablet Commonly known as: ADVIL   meclizine 12.5 MG tablet Commonly known as: ANTIVERT   Myrbetriq 25 MG Tb24 tablet Generic drug: mirabegron ER   naproxen sodium 220 MG tablet Commonly known as: ALEVE   ondansetron 4 MG disintegrating tablet Commonly known as: Zofran ODT   traMADol 50 MG  tablet Commonly known as: ULTRAM   traZODone 100 MG tablet Commonly known as: DESYREL       TAKE these medications    albuterol 108 (90 Base) MCG/ACT inhaler Commonly known as: VENTOLIN HFA Inhale 2 puffs into the lungs every 4 (four) hours as needed for wheezing or shortness of breath.   amphetamine-dextroamphetamine 20 MG tablet Commonly known as: ADDERALL Take 20 mg by mouth 2 (two) times daily.   buPROPion 150 MG 24 hr tablet Commonly known as: WELLBUTRIN XL Take 150 mg by mouth at bedtime.   celecoxib 200 MG capsule Commonly known as: CELEBREX Take 200 mg by mouth daily.   cyproheptadine 4 MG tablet Commonly known as: PERIACTIN Take 4 mg by mouth 3 (three) times daily.   docusate sodium 100 MG capsule Commonly known as: COLACE Take 1 capsule (100 mg total) by mouth 2 (two) times daily.   ipratropium-albuterol 0.5-2.5 (3) MG/3ML Soln Commonly known as: DUONEB Take 3 mLs by nebulization every 6 (six) hours as needed.   lidocaine 5 % Commonly known as: Lidoderm Place 1 patch onto the skin daily. Remove & Discard patch within 12 hours or as directed by MD   LORazepam 1 MG tablet Commonly known as: ATIVAN Take 1 tablet (1 mg total) by mouth 2 (two) times daily as needed for up to 3 days for anxiety ("Do not take with narcotic medications. Must choose between one or the other.").   methocarbamol 750 MG tablet Commonly known as: ROBAXIN Take 1 tablet (750 mg total) by mouth every 8 (eight) hours as needed for up to 3 days (muscle spasm/pain). What changed: when to take this   mirtazapine 15 MG tablet Commonly known as: REMERON Take 1 tablet (15 mg total) by mouth at bedtime for 3 days.   oxyCODONE 5 MG immediate release tablet Commonly known as: Oxy IR/ROXICODONE Take 1 tablet (5 mg total) by mouth every 4 (four) hours as needed for up to 3 days for severe pain.   polyethylene glycol 17 g packet Commonly known as: MIRALAX / GLYCOLAX Take 17 g by mouth  daily. Start taking on: July 20, 2021   venlafaxine XR 150 MG 24 hr capsule Commonly known as: EFFEXOR-XR Take 150 mg by mouth daily with breakfast.        Discharge Instructions:  Diet Recommendation:  Discharge Diet Orders (From admission, onward)     Start     Ordered   07/19/21 0000  Diet general       Comments: Diet Orders (From admission, onward)    Start     Ordered   07/18/21 0842  Diet regular Room service appropriate? Yes; Fluid  consistency: Thin  Diet effective now       Question Answer Comment  Room service appropriate? Yes   Fluid consistency: Thin     07/18/21 0843       07/19/21 1115              @BRDDSCINSTRUCTIONS @  Follow ups:    Follow-up Information     , MD Follow up.   Specialty: Family  Medicine Contact information: [address] [place] [address] [phone]                 Wound care:   Incision 02/27/12 Shoulder (Active)  Date First Assessed/Time First Assessed: 02/27/12 1439   Location: Shoulder    Assessments 02/27/2012  4:15 PM 02/29/2012  7:00 AM  Dressing Type Foam Foam  Dressing Clean;Dry;Intact Clean;Dry;Intact     No Linked orders to display     Incision 02/27/12 Neck Left (Active)  Date First Assessed/Time First Assessed: 02/27/12 1439   Location: Neck  Location Orientation: Left    Assessments 02/27/2012  4:15 PM 02/29/2012  7:00 AM  Dressing Type Foam Foam  Dressing Clean;Dry;Intact Clean;Dry;Intact     No Linked orders to display     Incision (Closed) 03/27/20 Leg Left (Active)  Date First Assessed/Time First Assessed: 03/27/20 1409   Location: Leg  Location Orientation: Left    Assessments 03/27/2020  3:15 PM 04/03/2020  8:08 AM  Dressing Type Compression wrap Silver hydrofiber  Dressing Clean;Dry;Intact Dry;Intact;Old drainage (marked)  Site / Wound Assessment Dressing in place / Unable to assess Dressing in place / Unable to assess  Drainage Amount None --     No  Linked orders to display    Discharge Exam:   Vitals:   07/18/21 2005 07/19/21 0021 07/19/21 0347 07/19/21 0751  BP: 124/72 (!) 96/42 (!) 142/74 (!) 122/58  Pulse: 80 84 90 82  Resp: Temp: 98.9 F (37.2 C) 98.7 F (37.1 C) 98.4 F (36.9 C) 98.3 F (36.8 C)  TempSrc: Oral Oral Oral   SpO2: 95% 97% 98% 90%  Weight:      Height:        Body mass index is 18.85 kg/m.  General exam: Pleasant elderly Caucasian female.  Not in physical distress Skin: No rashes, lesions or ulcers. HEENT: Atraumatic, normocephalic, no obvious bleeding Lungs: Not in respiratory distress. CVS: Regular rate and rhythm, no murmur GI/Abd soft, nontender, nondistended, bowel present CNS: Alert, awake, oriented x3 Psychiatry: Mood appropriate. Extremities: No pedal edema, no calf tenderness  Time coordinating discharge: 35 minutes   The results of significant diagnostics from this hospitalization (including imaging, microbiology, ancillary and laboratory) are listed below for reference.    Procedures and Diagnostic Studies:   CT ABDOMEN PELVIS WO CONTRAST  Result Date: 07/09/2021 CLINICAL DATA:  Fall. EXAM: CT ABDOMEN AND PELVIS WITHOUT CONTRAST TECHNIQUE: Multidetector CT imaging of the abdomen and pelvis was performed following the standard protocol without IV contrast. COMPARISON:  May 24, 2021.  July 08, 2021. FINDINGS: Lower chest: Minimal anterior pneumothorax is noted in the right lung base. Right-sided chest tube is noted. Right pleural effusion is noted with associated right lower lobe atelectasis. Moderate size sliding-type hiatal hernia is noted. Hepatobiliary: Small gallstone is noted. No biliary dilatation is noted. The liver is unremarkable on these unenhanced images. Pancreas: Unremarkable. No pancreatic ductal dilatation or surrounding inflammatory changes. Spleen: Normal in size without focal abnormality. Adrenals/Urinary Tract: Adrenal glands are unremarkable. Kidneys  are normal, without renal calculi, focal lesion, or hydronephrosis. Bladder is unremarkable. Stomach/Bowel: The stomach appears normal. There is no evidence of bowel obstruction or inflammation. Vascular/Lymphatic: Aortic atherosclerosis. No enlarged abdominal or pelvic lymph nodes. Reproductive: Status post hysterectomy. No adnexal masses. Other: No abdominal wall hernia or abnormality. No abdominopelvic ascites. Musculoskeletal: Probably acute moderate compression deformity of T9 vertebral body is noted. IMPRESSION: Minimal anterior pneumothorax is noted in  the visualized portion of right lung base. Right-sided chest tube is again noted. Right pleural effusion is noted with associated right lower lobe atelectasis. Moderate size sliding-type hiatal hernia. Cholelithiasis without evidence of cholecystitis. Probably acute moderate compression fracture of T9 vertebral body. Aortic Atherosclerosis (ICD10-I70.0). Electronically Signed   By: Lupita Raider M.D.   On: 07/09/2021 14:22   DG Chest 2 View  Result Date: 07/08/2021 CLINICAL DATA:  Bilateral rib pain. EXAM: CHEST - 2 VIEW COMPARISON:  Chest x-ray dated June 29, 2021. FINDINGS: Normal heart size. New moderate right pleural effusion right middle and lower lobe atelectasis. No pneumothorax. No acute osseous abnormality. Old right-sided rib fractures. IMPRESSION: 1. New moderate right pleural effusion with right middle and lower lobe atelectasis. Electronically Signed   By: Obie Dredge M.D.   On: 07/08/2021 14:42   CT HEAD WO CONTRAST ( )  Result Date: 07/09/2021 CLINICAL DATA:  Fall EXAM: CT HEAD WITHOUT CONTRAST TECHNIQUE: Contiguous axial images were obtained from the base of the skull through the vertex without intravenous contrast. COMPARISON:  CT head 10/17/2008 FINDINGS: Brain: There is no acute intracranial hemorrhage, extra-axial fluid collection, or acute infarct. There is mild parenchymal volume loss with commensurate enlargement of  the ventricular system. Hypodensity in the subcortical and periventricular white matter likely reflects sequela of moderate chronic white matter microangiopathy. There is no mass lesion.  There is no midline shift. Vascular: There is calcification of the bilateral cavernous ICAs. Skull: Normal. Negative for fracture or focal lesion. Sinuses/Orbits: The imaged paranasal sinuses are clear. The globes and orbits are unremarkable. Other: None. IMPRESSION: 1. No acute intracranial pathology. 2. Moderate global parenchymal volume loss and chronic white matter microangiopathy. Electronically Signed   By: Lesia Hausen M.D.   On: 07/09/2021 14:45   CT Angio Chest PE W and/or Wo Contrast  Result Date: 07/08/2021 CLINICAL DATA:  Pt endorses bilateral rib pain since Friday. Denies SOB, but has pain with deep breathing. Denies any falls. EXAM: CT ANGIOGRAPHY CHEST WITH CONTRAST TECHNIQUE: Multidetector CT imaging of the chest was performed using the standard protocol during bolus administration of intravenous contrast. Multiplanar CT image reconstructions and MIPs were obtained to evaluate the vascular anatomy. CONTRAST:  38mL OMNIPAQUE IOHEXOL 350 MG/ML SOLN COMPARISON:  Chest x-ray 06/29/2021, CT chest 06/14/2020 FINDINGS: Cardiovascular: Satisfactory opacification of the pulmonary arteries to the segmental level. No evidence of pulmonary embolism. Normal heart size. No significant pericardial effusion. The thoracic aorta is normal in caliber. Mild atherosclerotic plaque of the thoracic aorta. At least 2 vessel coronary artery calcifications. Mediastinum/Nodes: No enlarged mediastinal, hilar, or axillary lymph nodes. Thyroid gland, trachea, and esophagus demonstrate no significant findings. Small to moderate volume hiatal hernia. Lungs/Pleura: Partial collapse of the right lower lobe. No focal consolidation. No pulmonary nodule. No pulmonary mass. Moderate volume right pleural fluid with hyperdensity along the rib  fractures consistent with a hemothorax. No left pleural effusion. No pneumothorax. Upper Abdomen: No acute abnormality. Musculoskeletal: Intra and extracapsular rupture of a bilateral breast implants. Associated circumferential calcifications. Chronic, but worsened from 06/14/2020, compression fracture of the T10 vertebral body with approximately 50% vertebral body height loss. Multiple other thoracic vertebral bodies demonstrate decreased vertebral body height loss. Old healed left rib fractures.  Old healed right rib fractures. Interval development of an acute right posterior eighth and ninth rib fractures (compared to CT chest 06/14/2020, retrospectively these may have been present on x-ray ribs 06/29/2021). Acute minimally displaced mid sternal body fracture. Right shoulder arthroplasty partially visualized.  Review of the MIP images confirms the above findings. IMPRESSION: 1. No pulmonary embolus. 2. Acute posterior right 8 and 9 rib fractures with associated moderate volume hemothorax. Retrospectively fractures may have been present on x-ray ribs 06/29/2021. 3. Acute minimally displaced mid sternal body fracture. 4. Small to moderate volume hiatal hernia. 5. Chronic, but worsened from 06/14/2020, compression fracture of the T10 vertebral body with approximately 50% vertebral body height loss. Recommend correlation with point tenderness to palpation for an acute component. 6. 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 Correlate with mammography. 7.  Aortic Atherosclerosis (ICD10-I70.0). Electronically Signed   By: Tish Frederickson M.D.   On: 07/08/2021 23:00   CT CERVICAL SPINE WO CONTRAST  Result Date: 07/09/2021 CLINICAL DATA:  Fall EXAM: CT CERVICAL SPINE WITHOUT CONTRAST TECHNIQUE: Multidetector CT imaging of the cervical spine was performed without intravenous contrast. Multiplanar CT image reconstructions were also generated. COMPARISON:  Cervical spine CT 12/01/2011,  cervical spine radiographs 02/07/2020 FINDINGS: Alignment: There is straightening of the cervical spine curvature. There is trace anterolisthesis of C2 on C3, 3 mm anterolisthesis of C3 on C4 and C4 on C5, and 4 mm anterolisthesis of T1 on T2, all slightly increased since 2013. There is no jumped or perched facet or other evidence of traumatic malalignment. Skull base and vertebrae: Skull base alignment is maintained. There are postsurgical changes reflecting ACDF spanning from C5 through T1 and separate anterior fusion at C4-C5. Posterior fusion hardware extends from C4 through T1. The patient is status post left-sided corpectomy at T7 there is partial osseous fusion across the C4-C5 level and solid-appearing fusion across the C5-C6, C6-C7, and C7-T1 levels. A portion of a previously fractured screw remains within the T1 vertebral body. There is increased irregularity of the superior T2 endplate with mild loss of vertebral body height anteriorly. There is no evidence of acute fracture. Soft tissues and spinal canal: There is no definite evidence of hematoma in the spinal canal, though evaluation is degraded by significant streak artifact. The soft tissues are otherwise unremarkable. Disc levels: Since 2013, there has been significant interval worsening of adjacent segment disease at T1-T2. The osseous spinal canal appears overall patent. There is multilevel facet arthropathy throughout the cervical spine resulting in moderate to severe bilateral neural foraminal stenosis at T1-T2. There is no definite high-grade neural foraminal stenosis at the remaining levels. Upper chest: A chest tube is seen coiled in the right apex. There is a partially imaged right pleural effusion. Patchy opacity in the left apex was not seen on the CTA chest from 1 day prior. Other: None. IMPRESSION: 1. No acute fracture or traumatic malalignment of the cervical spine. 2. Postsurgical changes reflecting anterior and posterior instrumented  fusion spanning from C4 through T1 without evidence of complication. 3. 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 4. Patchy opacities in the left apex were not definitely present on the prior CT chest and may reflect developing infection or aspiration. Electronically Signed   By: Lesia Hausen M.D.   On: 07/09/2021 14:38   DG Chest Port 1 View  Result Date: 07/09/2021 CLINICAL DATA:  Respiratory failure.  Chest tube placement. EXAM: PORTABLE CHEST 1 VIEW COMPARISON:  CT angiogram chest 07/08/2021. Chest radiographs 07/08/2021. FINDINGS: Heart size at the upper limits of normal, unchanged. Interval placement of a right-sided chest tube with tip projecting at the level of the right lung apex. No evidence of pneumothorax. Apparent elevation of the right hemidiaphragm likely reflecting  a small to moderate residual right pleural effusion/hemothorax. Additional ill-defined opacity within the mid to lower right lung field, and within the left lung base, likely reflecting atelectasis. Known right rib fractures, sternal fractures and vertebral fractures better characterized on the prior chest CT of 07/08/2021. Prior right shoulder arthroplasty. Fusion hardware within the cervical and upper thoracic spine. IMPRESSION: Interval placement of a right-sided chest tube with tip projecting at the level of the right lung apex. No pneumothorax. Significant interval decrease in size of a right pleural effusion/hemothorax, with a small-to-moderate volume residual. Atelectasis at the right greater than left lung bases. Electronically Signed   By: Jackey Loge D.O.   On: 07/09/2021 11:47   US Abdomen Limited RUQ (LIVER/GB)  Result Date: 07/08/2021 CLINICAL DATA:  Right upper quadrant abdominal pain, nausea vomiting. EXAM: ULTRASOUND ABDOMEN LIMITED RIGHT UPPER QUADRANT COMPARISON:  None. FINDINGS: Gallbladder: No gallstones or wall thickening visualized. No sonographic Murphy sign noted by  sonographer. Small focus of adenomyomatosis. Common bile duct: Diameter: 5 mm Liver: No focal lesion identified. Within normal limits in parenchymal echogenicity. Portal vein is patent on color Doppler imaging with normal direction of blood flow towards the liver. Other: Small right pleural effusion. IMPRESSION: Small right pleural effusion, otherwise unremarkable right upper quadrant ultrasound. Electronically Signed   By: Elgie Collard M.D.   On: 07/08/2021 19:57     Labs:   Basic Metabolic Panel: Recent Labs  Lab 07/16/21 0323 07/18/21 0305  NA 135 135  K 3.9 4.1  CL 104 101  CO2 24 26  GLUCOSE 110* 115*  BUN 18 17  CREATININE 0.88 0.63  CALCIUM 8.3* 8.4*  MG 2.0  --   PHOS 3.8  --    GFR Estimated Creatinine Clearance: 56.4 mL/min (by C-G formula based on SCr of 0.63 mg/dL). Liver Function Tests: No results for input(s): AST, ALT, ALKPHOS, BILITOT, PROT, ALBUMIN in the last 168 hours. No results for input(s): LIPASE, AMYLASE in the last 168 hours. No results for input(s): AMMONIA in the last 168 hours. Coagulation profile No results for input(s): INR, PROTIME in the last 168 hours.  CBC: Recent Labs  Lab 07/16/21 0323 07/17/21 0348 07/18/21 0305 07/19/21 0352  WBC 10.6* 11.6* 11.2* 9.4  NEUTROABS 6.7 7.7 7.0 5.9  HGB 7.3* 7.9* 7.2* 7.3*  HCT 22.7* 24.4* 22.8* 23.8*  MCV 90.8 91.0 90.8 92.2  PLT 674* 742* 807* 848*   Cardiac Enzymes: No results for input(s): CKTOTAL, CKMB, CKMBINDEX, TROPONINI in the last 168 hours. BNP: Invalid input(s): POCBNP CBG: No results for input(s): GLUCAP in the last 168 hours. D-Dimer No results for input(s): DDIMER in the last 72 hours. Hgb A1c No results for input(s): HGBA1C in the last 72 hours. Lipid Profile No results for input(s): CHOL, HDL, LDLCALC, TRIG, CHOLHDL, LDLDIRECT in the last 72 hours. Thyroid function studies No results for input(s): TSH, T4TOTAL, T3FREE, THYROIDAB in the last 72 hours.  Invalid input(s):  FREET3 Anemia work up No results for input(s): VITAMINB12, FOLATE, FERRITIN, TIBC, IRON, RETICCTPCT in the last 72 hours. Microbiology Recent Results (from the past 240 hour(s))  SARS CORONAVIRUS 2 (TAT 6-24 HRS) Nasopharyngeal Nasopharyngeal Swab     Status: None   Collection Time: 07/18/21  3:37 PM   Specimen: Nasopharyngeal Swab  Result Value Ref Range Status   SARS Coronavirus 2 NEGATIVE NEGATIVE Final    Comment: (NOTE) SARS-CoV-2 target nucleic acids are NOT DETECTED.  The SARS-CoV-2 RNA is generally detectable in upper and lower respiratory  specimens during the acute phase of infection. Negative results do not preclude SARS-CoV-2 infection, do not rule out co-infections with other pathogens, and should not be used as the sole basis for treatment or other patient management decisions. Negative results must be combined with clinical observations, patient history, and epidemiological information. The expected result is Negative.  Fact Sheet for Patients: HairSlick.no  Fact Sheet for Healthcare Providers: quierodirigir.com  This test is not yet approved or cleared by the [place] FDA and  has been authorized for detection and/or diagnosis of SARS-CoV-2 by FDA under an Emergency Use Authorization (EUA). This EUA will remain  in effect (meaning this test can be used) for the duration of the COVID-19 declaration under Se ction 564(b)(1) of the Act, 21 U.S.C. section 360bbb-3(b)(1), unless the authorization is terminated or revoked sooner.  Performed at [facility], [address], [place], [place] [address]      Signed: Lorin Glass  [facility] 07/19/2021, 11:15 AM

## 2021-07-19 NOTE — TOC Transition Note (Signed)
Transition of Care [facility]) - CM/SW Discharge Note   Patient Details  Name: JERALDEAN WECHTER MRN: [id number] Date of Birth: [birth year]  Transition of Care [facility]) CM/SW Contact:  Eduard Roux, LCSW Phone Number: 07/19/2021, 1:48 PM   Clinical Narrative:     Patient will Discharge to: [facility] Discharge Date: 07/19/2021 Family Notified: patient states no family Transport IE:[facility]  Per MD patient is ready for discharge. RN, patient, and facility notified of discharge. Discharge Summary sent to facility. RN given number for report606 [phone]. Ambulance transport requested for patient.   Clinical Social Worker signing off.  Antony Blackbird, MSW, LCSW Clinical Social Worker     Final next level of care: Skilled Nursing Facility Barriers to Discharge: Barriers Resolved   Patient Goals and CMS Choice        Discharge Placement              Patient chooses bed at: [facility] Patient to be transferred to facility by: [name] Name of family member notified: patient declined - states no family Patient and family notified of of transfer: 07/19/21  Discharge Plan and Services In-house Referral: Clinical Social Work                                   Social Determinants of Health (SDOH) Interventions     Readmission Risk Interventions No flowsheet data found.

## 2021-07-20 LAB — CBC WITH DIFFERENTIAL/PLATELET
Abs Immature Granulocytes: 0.06 10*3/uL (ref 0.00–0.07)
Basophils Absolute: 0 10*3/uL (ref 0.0–0.1)
Basophils Relative: 0 %
Eosinophils Absolute: 0.3 10*3/uL (ref 0.0–0.5)
Eosinophils Relative: 3 %
HCT: 24.7 % — ABNORMAL LOW (ref 36.0–46.0)
Hemoglobin: 7.9 g/dL — ABNORMAL LOW (ref 12.0–15.0)
Immature Granulocytes: 1 %
Lymphocytes Relative: 17 %
Lymphs Abs: 1.6 10*3/uL (ref 0.7–4.0)
MCH: 28.7 pg (ref 26.0–34.0)
MCHC: 32 g/dL (ref 30.0–36.0)
MCV: 89.8 fL (ref 80.0–100.0)
Monocytes Absolute: 1.1 10*3/uL — ABNORMAL HIGH (ref 0.1–1.0)
Monocytes Relative: 11 %
Neutro Abs: 6.6 10*3/uL (ref 1.7–7.7)
Neutrophils Relative %: 68 %
Platelets: 887 10*3/uL — ABNORMAL HIGH (ref 150–400)
RBC: 2.75 MIL/uL — ABNORMAL LOW (ref 3.87–5.11)
RDW: 14.4 % (ref 11.5–15.5)
WBC: 9.6 10*3/uL (ref 4.0–10.5)
nRBC: 0 % (ref 0.0–0.2)

## 2021-08-09 ENCOUNTER — Encounter (HOSPITAL_COMMUNITY): Payer: Self-pay | Admitting: Radiology

## 2021-11-09 ENCOUNTER — Emergency Department (HOSPITAL_COMMUNITY)
Admission: EM | Admit: 2021-11-09 | Discharge: 2021-11-09 | Disposition: A | Discharge status: Home / Self Care | Payer: Medicare Other | Attending: Emergency Medicine | Admitting: Emergency Medicine

## 2021-11-09 ENCOUNTER — Other Ambulatory Visit: Payer: Self-pay

## 2021-11-09 ENCOUNTER — Encounter (HOSPITAL_COMMUNITY): Payer: Self-pay | Admitting: Emergency Medicine

## 2021-11-09 ENCOUNTER — Emergency Department (HOSPITAL_COMMUNITY): Payer: Medicare Other

## 2021-11-09 DIAGNOSIS — M25552 Pain in left hip: Secondary | ICD-10-CM | POA: Insufficient documentation

## 2021-11-09 DIAGNOSIS — Y9301 Activity, walking, marching and hiking: Secondary | ICD-10-CM | POA: Diagnosis not present

## 2021-11-09 DIAGNOSIS — W1839XA Other fall on same level, initial encounter: Secondary | ICD-10-CM | POA: Diagnosis not present

## 2021-11-09 DIAGNOSIS — W19XXXA Unspecified fall, initial encounter: Secondary | ICD-10-CM

## 2021-11-09 LAB — CBC WITH DIFFERENTIAL/PLATELET
Abs Immature Granulocytes: 0.02 10*3/uL (ref 0.00–0.07)
Basophils Absolute: 0.1 10*3/uL (ref 0.0–0.1)
Basophils Relative: 2 %
Eosinophils Absolute: 0.1 10*3/uL (ref 0.0–0.5)
Eosinophils Relative: 3 %
HCT: 42.2 % (ref 36.0–46.0)
Hemoglobin: 13 g/dL (ref 12.0–15.0)
Immature Granulocytes: 0 %
Lymphocytes Relative: 31 %
Lymphs Abs: 1.6 10*3/uL (ref 0.7–4.0)
MCH: 27.8 pg (ref 26.0–34.0)
MCHC: 30.8 g/dL (ref 30.0–36.0)
MCV: 90.4 fL (ref 80.0–100.0)
Monocytes Absolute: 0.4 10*3/uL (ref 0.1–1.0)
Monocytes Relative: 8 %
Neutro Abs: 2.9 10*3/uL (ref 1.7–7.7)
Neutrophils Relative %: 56 %
Platelets: 315 10*3/uL (ref 150–400)
RBC: 4.67 MIL/uL (ref 3.87–5.11)
RDW: 18.1 % — ABNORMAL HIGH (ref 11.5–15.5)
WBC: 5.2 10*3/uL (ref 4.0–10.5)
nRBC: 0 % (ref 0.0–0.2)

## 2021-11-09 LAB — BASIC METABOLIC PANEL
Anion gap: 10 (ref 5–15)
BUN: 17 mg/dL (ref 8–23)
CO2: 20 mmol/L — ABNORMAL LOW (ref 22–32)
Calcium: 9.1 mg/dL (ref 8.9–10.3)
Chloride: 109 mmol/L (ref 98–111)
Creatinine, Ser: 0.71 mg/dL (ref 0.44–1.00)
GFR, Estimated: >60 mL/min (ref >60)
Glucose, Bld: 105 mg/dL — ABNORMAL HIGH (ref 70–99)
Potassium: 4.2 mmol/L (ref 3.5–5.1)
Sodium: 139 mmol/L (ref 135–145)

## 2021-11-09 MED ORDER — MORPHINE SULFATE (PF) 4 MG/ML IV SOLN
4.0000 mg | Freq: Once | INTRAVENOUS | Status: AC
  Administered 2021-11-09: 4 mg via INTRAVENOUS
  Filled 2021-11-09: qty 1

## 2021-11-09 MED ORDER — MORPHINE SULFATE (PF) 2 MG/ML IV SOLN
2.0000 mg | Freq: Once | INTRAVENOUS | Status: AC
  Administered 2021-11-09: 2 mg via INTRAVENOUS
  Filled 2021-11-09: qty 1

## 2021-11-09 NOTE — ED Provider Notes (Signed)
[facility] EMERGENCY DEPARTMENT Provider Note   CSN: [id number] Arrival date & time: 11/09/21  1201     History  Chief Complaint  Patient presents with   [name]    [name] is a 71 y.o. female.  Patient presents with chief complaint left hip pain.  She states that earlier today she was out using a rollator onto her left hip.  Denies head injury or headache or neck pain.  Denies loss of consciousness.  No reports of fevers or cough or vomiting or diarrhea.      Home Medications Prior to Admission medications   Medication Sig Start Date End Date Taking? Authorizing Provider  albuterol (PROVENTIL HFA;VENTOLIN HFA) 108 (90 BASE) MCG/ACT inhaler Inhale 2 puffs into the lungs every 4 (four) hours as needed for wheezing or shortness of breath. Patient not taking: Reported on 07/08/2021    [provider]  amphetamine-dextroamphetamine (ADDERALL) 20 MG tablet Take 20 mg by mouth 2 (two) times daily.     [provider]  buPROPion (WELLBUTRIN XL) 150 MG 24 hr tablet Take 150 mg by mouth at bedtime.    [provider]  celecoxib (CELEBREX) 200 MG capsule Take 200 mg by mouth daily. 05/14/21   [provider]  cyproheptadine (PERIACTIN) 4 MG tablet Take 4 mg by mouth 3 (three) times daily. Patient not taking: Reported on 07/08/2021    [provider]  docusate sodium (COLACE) 100 MG capsule Take 1 capsule (100 mg total) by mouth 2 (two) times daily. 07/19/21   Dahal, Marlowe Aschoff, MD  ipratropium-albuterol (DUONEB) 0.5-2.5 (3) MG/3ML SOLN Take 3 mLs by nebulization every 6 (six) hours as needed. 07/19/21   Dahal, Marlowe Aschoff, MD  lidocaine (LIDODERM) 5 % Place 1 patch onto the skin daily. Remove & Discard patch within 12 hours or as directed by MD Patient not taking: No sig reported 04/20/21   Maudie Flakes, MD  LORazepam (ATIVAN) 1 MG tablet Take 1 tablet (1 mg total) by mouth 2 (two) times daily as needed for up to 3 days for anxiety  ("Do not take with narcotic medications. Must choose between one or the other."). 07/19/21 07/22/21  Terrilee Croak, MD  mirtazapine (REMERON) 15 MG tablet Take 1 tablet (15 mg total) by mouth at bedtime for 3 days. 07/19/21 07/22/21  Dahal, Marlowe Aschoff, MD  polyethylene glycol (MIRALAX / GLYCOLAX) 17 g packet Take 17 g by mouth daily. 07/20/21   Terrilee Croak, MD  venlafaxine XR (EFFEXOR-XR) 150 MG 24 hr capsule Take 150 mg by mouth daily with breakfast.    [provider]      Allergies    Penicillins and Vancomycin    Review of Systems   Review of Systems  Constitutional:  Negative for fever.  HENT:  Negative for ear pain.   Eyes:  Negative for pain.  Respiratory:  Negative for cough.   Cardiovascular:  Negative for chest pain.  Gastrointestinal:  Negative for abdominal pain.  Genitourinary:  Negative for flank pain.  Musculoskeletal:  Negative for back pain.  Skin:  Negative for rash.  Neurological:  Negative for headaches.   Physical Exam Updated Vital Signs BP (!) 146/72    Pulse (!) 59    Temp 98.1 F (36.7 C) (Oral)    Resp 10    Ht 5\' 8"  (1.727 m)    Wt 56 kg    SpO2 98%    BMI 18.77 kg/m  Physical Exam Constitutional:  General: She is not in acute distress.    Appearance: Normal appearance.  HENT:     Head: Normocephalic.     Nose: Nose normal.  Eyes:     Extraocular Movements: Extraocular movements intact.  Cardiovascular:     Rate and Rhythm: Normal rate.  Pulmonary:     Effort: Pulmonary effort is normal.  Musculoskeletal:        General: Normal range of motion.     Cervical back: Normal range of motion.     Comments: Pain to the lateral aspect of the left hip.  Otherwise neurovascularly intact bilateral upper and lower extremities.  Neurological:     General: No focal deficit present.     Mental Status: She is alert. Mental status is at baseline.    ED Results / Procedures / Treatments   Labs (all labs ordered are listed, but only abnormal results  are displayed) Labs Reviewed  CBC WITH DIFFERENTIAL/PLATELET - Abnormal; Notable for the following components:      Result Value   RDW 18.1 (*)    All other components within normal limits  BASIC METABOLIC PANEL - Abnormal; Notable for the following components:   CO2 20 (*)    Glucose, Bld 105 (*)    All other components within normal limits    EKG None  Radiology DG Hip Unilat With Pelvis 2-3 Views Left  Result Date: 11/09/2021 CLINICAL DATA:  LEFT hip pain post fall EXAM: DG HIP (WITH OR WITHOUT PELVIS) 2-3V LEFT COMPARISON:  CT 07/09/2021 FINDINGS: Cannulated screw fixation of the LEFT femoral neck. Total RIGHT hip arthroplasty. No acute fracture or dislocation. IMPRESSION: No acute fracture or dislocation. Electronically Signed   By: Suzy Bouchard M.D.   On: 11/09/2021 14:27    Procedures Procedures    Medications Ordered in ED Medications  morphine (PF) 2 MG/ML injection 2 mg (2 mg Intravenous Given 11/09/21 1240)  morphine (PF) 4 MG/ML injection 4 mg (4 mg Intravenous Given 11/09/21 1347)    ED Course/ Medical Decision Making/ A&P                           Medical Decision Making Amount and/or Complexity of Data Reviewed Labs: ordered. Radiology: ordered.  Risk Prescription drug management.   Chart review shows multiple prior ER visits last visit July 08, 2021 for hemothorax.  Imaging shows no acute findings.  I ordered CT imaging as the patient still complained of pain, however she refused CT scan stating that she feels everything is fine.  I told her the risk and benefits of possible missed injury including intracranial injury as well as missed hairline fracture on the x-ray.  Patient expressed understanding declined studies and prefers to go home.  She appears to have decision-making capacity and refuses further imaging at this time.  I invited to return at any time if she changes her mind.  At this time she appears to be flexing her knees and hips  bilaterally well in bed without evidence of pain.  Patient declines further imaging.  She is well informed of risk of possible missed injury.        Final Clinical Impression(s) / ED Diagnoses Final diagnoses:  Fall, initial encounter    Rx / DC Orders ED Discharge Orders     None         Luna Fuse, MD 11/09/21 1524

## 2021-11-09 NOTE — Discharge Instructions (Signed)
Call your primary care doctor or specialist as discussed in the next 2-3 days.   Return immediately back to the ER if:  Your symptoms worsen within the next 12-24 hours. You develop new symptoms such as new fevers, persistent vomiting, new pain, shortness of breath, or new weakness or numbness, or if you have any other concerns.  

## 2021-11-09 NOTE — ED Triage Notes (Signed)
Pt BIB [facility] after fall while walking and fell on left hip. Pt has multiple hip surgeries. L hip is tender to palpation. EMS gave 100 mcg IN fentanyl. No LOC, no blood thinner. Mechanical fall. Uses walker at home.   BP-180/78  HR 72  SpO2- 100

## 2021-11-09 NOTE — ED Notes (Signed)
Patient transported to X-ray 

## 2021-12-19 ENCOUNTER — Emergency Department (HOSPITAL_COMMUNITY): Payer: Medicare Other

## 2021-12-19 ENCOUNTER — Emergency Department (HOSPITAL_COMMUNITY)
Admission: EM | Admit: 2021-12-19 | Discharge: 2021-12-19 | Disposition: A | Discharge status: Home / Self Care | Payer: Medicare Other | Attending: Emergency Medicine | Admitting: Emergency Medicine

## 2021-12-19 ENCOUNTER — Other Ambulatory Visit: Payer: Self-pay

## 2021-12-19 ENCOUNTER — Encounter (HOSPITAL_COMMUNITY): Payer: Self-pay | Admitting: Emergency Medicine

## 2021-12-19 DIAGNOSIS — W19XXXA Unspecified fall, initial encounter: Secondary | ICD-10-CM

## 2021-12-19 DIAGNOSIS — J45909 Unspecified asthma, uncomplicated: Secondary | ICD-10-CM | POA: Diagnosis not present

## 2021-12-19 DIAGNOSIS — M542 Cervicalgia: Secondary | ICD-10-CM | POA: Diagnosis not present

## 2021-12-19 DIAGNOSIS — I10 Essential (primary) hypertension: Secondary | ICD-10-CM | POA: Diagnosis not present

## 2021-12-19 DIAGNOSIS — W01198A Fall on same level from slipping, tripping and stumbling with subsequent striking against other object, initial encounter: Secondary | ICD-10-CM | POA: Insufficient documentation

## 2021-12-19 DIAGNOSIS — M25552 Pain in left hip: Secondary | ICD-10-CM

## 2021-12-19 MED ORDER — LIDOCAINE 5 % EX PTCH
1.0000 | MEDICATED_PATCH | CUTANEOUS | Status: DC
  Administered 2021-12-19: 1 via TRANSDERMAL
  Filled 2021-12-19: qty 1

## 2021-12-19 MED ORDER — LIDOCAINE 4 % EX PTCH: 1.0000 | MEDICATED_PATCH | Freq: Two times a day (BID) | CUTANEOUS | 0 refills | Status: DC | PRN

## 2021-12-19 NOTE — ED Provider Notes (Signed)
?Care of patient assumed from PA Roemhildt at 1500.  Agree with history, physical exam and plan.  See their note for further details. Briefly, 71 year old female presents to the emergency department with a chief complaint of neck pain and left hip pain after suffering a fall last week.  Patient reports that she has been ambulatory since the fall. ? ?Patient denies any numbness, weakness, saddle anesthesia, bowel/bladder dysfunction, visual disturbance. ? ?Physical Exam  ?BP 130/72 (BP Location: Right Arm)   Pulse 81   Temp 98.1 ?F (36.7 ?C) (Oral)   Resp 18   SpO2 97%  ? ?Physical Exam ?Vitals and nursing note reviewed.  ?Constitutional:   ?   General: She is not in acute distress. ?   Appearance: She is not ill-appearing, toxic-appearing or diaphoretic.  ?HENT:  ?   Head: Normocephalic.  ?Eyes:  ?   General: No scleral icterus.    ?   Right eye: No discharge.     ?   Left eye: No discharge.  ?Cardiovascular:  ?   Rate and Rhythm: Normal rate.  ?Pulmonary:  ?   Effort: Pulmonary effort is normal.  ?Musculoskeletal:  ?   Cervical back: Tenderness present. No swelling, edema, deformity, erythema, signs of trauma, lacerations, rigidity, spasms, torticollis, bony tenderness or crepitus. No pain with movement. Normal range of motion.  ?   Thoracic back: No swelling, edema, deformity, signs of trauma, lacerations, spasms, tenderness or bony tenderness.  ?   Lumbar back: No swelling, edema, deformity, signs of trauma, lacerations, spasms, tenderness or bony tenderness.  ?   Right hip: No deformity, lacerations, tenderness, bony tenderness or crepitus. Normal range of motion. Normal strength.  ?   Left hip: Tenderness and bony tenderness present. No deformity, lacerations or crepitus. Normal range of motion. Normal strength.  ?   Right upper leg: Normal.  ?   Left upper leg: Normal.  ?   Right knee: No swelling, deformity, effusion, erythema, ecchymosis, lacerations, bony tenderness or crepitus. Normal range of motion.  No tenderness. Normal alignment.  ?   Left knee: No swelling, deformity, effusion, erythema, ecchymosis, lacerations, bony tenderness or crepitus. Normal range of motion. No tenderness. Normal alignment.  ?   Right lower leg: Normal.  ?   Left lower leg: Normal.  ?   Comments: Patient has bilateral cervical paraspinous tenderness.  No midline tenderness or deformity to cervical, thoracic, or lumbar spine.  Patient has no leg length discrepancy or rotation.  No deformity, ecchymosis, contusion, or wound to left hip.  Patient does have diffuse tenderness to left hip.  ?Skin: ?   General: Skin is warm and dry.  ?Neurological:  ?   General: No focal deficit present.  ?   Mental Status: She is alert.  ?Psychiatric:     ?   Behavior: Behavior is cooperative.  ? ? ?Procedures  ?Procedures ? ?ED Course / MDM  ?  ?Medical Decision Making ?Amount and/or Complexity of Data Reviewed ?Radiology: ordered. ? ?Risk ?Prescription drug management. ? ? ?At time of handoff pending x-ray imaging at this time. ? ?I personally viewed and interpreted patient's x-ray imaging.  Agree with radiology interpretation of: ?-Stable appearance of cervical spine after prior cervical fusion.  No acute fracture. ?-Prior screw fixation of the left femoral neck.  No evidence of hardware complication or radiographical evidence of acute fracture. ? ?I discussed with patient that x-ray imaging can sometimes miss subtle fractures and cervical spine.  Discussed obtaining CT imaging  of cervical spine.  Patient declined CT imaging at this time. ? ?I personally stood and ambulated patient.  Patient was able to stand and ambulate with analgesic gait with assistance x1.  Patient states that she uses a rollator walker and has 1 at her hotel room. ? ?Discussed symptomatic treatment with Tylenol, ibuprofen, and lidocaine patch. ? ?After discussing discharge with patient.  She reports that she is running out of money and only has enough funds to stay in the hotel she  is at for the next 2 days.  I discussed patient and patient's case with on-call social worker Beverlee Nims.  Social worker advised to give patient resources guide and have her call shelters in the area to look for placement. ? ?Discussed results, findings, treatment and follow up. Patient advised of return precautions. Patient verbalized understanding and agreed with plan. ? ? ? ?  ?Loni Beckwith, PA-C ?12/19/21 [id number] ? ?  ?Pattricia Boss, MD ?12/19/21 2314 ? ?

## 2021-12-19 NOTE — ED Provider Notes (Signed)
?Agenda DEPT ?Provider Note ? ? ?CSN: [id number] ?Arrival date & time: 12/19/21  1343 ? ?  ? ?History ? ?Chief Complaint  ?Patient presents with  ? Fall  ? ? ?[name] is a 71 y.o. female who presents emergency department complaining of left hip pain and right neck pain after fall 1 week ago.  Patient states that she was trying to get into the car, when she lost her balance and fell on cement.  She states she hit her head, but there is no loss of consciousness.  Her hip and neck pain is both worse with movement.  No headache or dizziness.  She is not on chronic anticoagulation. ? ? ?Fall ?Pertinent negatives include no headaches.  ? ?  ? ?Home Medications ?Prior to Admission medications   ?Medication Sig Start Date End Date Taking? Authorizing Provider  ?albuterol (PROVENTIL HFA;VENTOLIN HFA) 108 (90 BASE) MCG/ACT inhaler Inhale 2 puffs into the lungs every 4 (four) hours as needed for wheezing or shortness of breath. ?Patient not taking: Reported on 07/08/2021    [provider]  ?amphetamine-dextroamphetamine (ADDERALL) 20 MG tablet Take 20 mg by mouth 2 (two) times daily.     [provider]  ?buPROPion (WELLBUTRIN XL) 150 MG 24 hr tablet Take 150 mg by mouth at bedtime.    [provider]  ?celecoxib (CELEBREX) 200 MG capsule Take 200 mg by mouth daily. 05/14/21   [provider]  ?cyproheptadine (PERIACTIN) 4 MG tablet Take 4 mg by mouth 3 (three) times daily. ?Patient not taking: Reported on 07/08/2021    [provider]  ?docusate sodium (COLACE) 100 MG capsule Take 1 capsule (100 mg total) by mouth 2 (two) times daily. 07/19/21   Terrilee Croak, MD  ?ipratropium-albuterol (DUONEB) 0.5-2.5 (3) MG/3ML SOLN Take 3 mLs by nebulization every 6 (six) hours as needed. 07/19/21   Terrilee Croak, MD  ?lidocaine (LIDODERM) 5 % Place 1 patch onto the skin daily. Remove & Discard patch within 12 hours or as directed by MD ?Patient not taking:  No sig reported 04/20/21   Maudie Flakes, MD  ?LORazepam (ATIVAN) 1 MG tablet Take 1 tablet (1 mg total) by mouth 2 (two) times daily as needed for up to 3 days for anxiety ("Do not take with narcotic medications. Must choose between one or the other."). 07/19/21 07/22/21  Terrilee Croak, MD  ?mirtazapine (REMERON) 15 MG tablet Take 1 tablet (15 mg total) by mouth at bedtime for 3 days. 07/19/21 07/22/21  Terrilee Croak, MD  ?polyethylene glycol (MIRALAX / GLYCOLAX) 17 g packet Take 17 g by mouth daily. 07/20/21   Terrilee Croak, MD  ?venlafaxine XR (EFFEXOR-XR) 150 MG 24 hr capsule Take 150 mg by mouth daily with breakfast.    [provider]  ?   ? ?Allergies    ?Penicillins and Vancomycin   ? ?Review of Systems   ?Review of Systems  ?Musculoskeletal:  Positive for neck pain. Negative for back pain.  ?     Left hip pain  ?Neurological:  Negative for syncope, weakness, numbness and headaches.  ?All other systems reviewed and are negative. ? ?Physical Exam ?Updated Vital Signs ?BP 130/72 (BP Location: Right Arm)   Pulse 81   Temp 98.1 ?F (36.7 ?C) (Oral)   Resp 18   SpO2 97%  ?Physical Exam ?Vitals and nursing note reviewed.  ?Constitutional:   ?   Appearance: Normal appearance.  ?HENT:  ?   Head: Normocephalic  and atraumatic.  ?   Right Ear: No mastoid tenderness.  ?Eyes:  ?   Conjunctiva/sclera: Conjunctivae normal.  ?Neck:  ? ?Pulmonary:  ?   Effort: Pulmonary effort is normal. No respiratory distress.  ?Musculoskeletal:  ?   Cervical back: Neck supple. Pain with movement and muscular tenderness present. No spinous process tenderness.  ?   Comments: Full passive ROM of all regions of spine.  No midline spinal tenderness, step-offs or crepitus.  Strength 5/5 in all extremities.  Sensation intact in all extremities. No focal tenderness to palpation of left hip, no deformity palpated.   ?Lymphadenopathy:  ?   Cervical: No cervical adenopathy.  ?Skin: ?   General: Skin is warm and dry.  ?Neurological:  ?    Mental Status: She is alert.  ?Psychiatric:     ?   Mood and Affect: Mood normal.     ?   Behavior: Behavior normal.  ? ? ?ED Results / Procedures / Treatments   ?Labs ?(all labs ordered are listed, but only abnormal results are displayed) ?Labs Reviewed - No data to display ? ?EKG ?None ? ?Radiology ?No results found. ? ?Procedures ?Procedures  ? ? ?Medications Ordered in ED ?Medications - No data to display ? ?ED Course/ Medical Decision Making/ A&P ?  ?                        ?Medical Decision Making ?Amount and/or Complexity of Data Reviewed ?Radiology: ordered. ? ? ?This patient is a 71 year old female who presents to the ED for concern of fall.  ? ?Differential diagnoses prior to evaluation: ?The emergent differential diagnosis includes, but is not limited to,  acute fractures/dislocations, muscular strain.  ? ?This is not an exhaustive differential.  ? ?Past Medical History / Co-morbidities: ?Anxiety, depression, HLD, osteoporosis, HTN, asthma, GERD ? ?Physical Exam: ?Physical exam performed. The pertinent findings include: No midline spinal tenderness, step offs or crepitus. No focal left hip tenderness or deformity. Neurovascularly intact in all extremities. Neck is supple, with muscular tenderness on right lateral neck.  ? ?Lab Tests/Imaging studies: ?I Ordered, and personally interpreted labs/imaging including cervical and left hip x-ray.  The results are pending at time of shift change.  ?  ?Disposition: ?Patient discussed and care transferred to oncoming provider Badalamente PA-C at shift change. See his note for disposition. Results are pending at time of shift change. Likely if patient has no acute fractures on x-ray imaging, will need shared decision making conversation about CT imaging.  ? ?Portions of this report may have been transcribed using voice recognition software. Every effort was made to ensure accuracy; however, inadvertent computerized transcription errors may be present.  ? ?Final  Clinical Impression(s) / ED Diagnoses ?Final diagnoses:  ?Left hip pain  ?Neck pain on right side  ?Fall, initial encounter  ? ? ?Rx / DC Orders ?ED Discharge Orders   ? ? None  ? ?  ? ? ?  ?Kateri Plummer, PA-C ?12/19/21 1507 ? ?  ?Deno Etienne, DO ?12/19/21 1509 ? ?

## 2021-12-19 NOTE — ED Triage Notes (Signed)
BIBA ?Per EMS: Pt coming from home w/ c/o fall x1 week ago when getting into car. C/o left hip pain & right neck pain. VSS.  ?

## 2021-12-19 NOTE — Discharge Instructions (Addendum)
You came to the emergency department today to be evaluated for your hip and back pain.  The x-ray imaging of your hip and neck showed no broken bones or dislocations.  Your pain is likely musculoskeletal in nature and should improve over time.  I have given you prescription for lidocaine patches.  Additionally may take Tylenol and ibuprofen as outlined below.  If your pain does not improve please follow-up with your primary care provider. ? ?Please take Ibuprofen (Advil, motrin) and Tylenol (acetaminophen) to relieve your pain.   ? ?You may take up to 600 MG (3 pills) of normal strength ibuprofen every 8 hours as needed.   ?You make take tylenol, up to 1,000 mg (two extra strength pills) every 8 hours as needed.  ? ?It is safe to take ibuprofen and tylenol at the same time as they work differently.  ? Do not take more than 3,000 mg tylenol in a 24 hour period (not more than one dose every 8 hours.  Please check all medication labels as many medications such as pain and cold medications may contain tylenol.  Do not drink alcohol while taking these medications.  Do not take other NSAID'S while taking ibuprofen (such as aleve or naproxen).  Please take ibuprofen with food to decrease stomach upset. ? ?Get help right away if: ?You develop new bowel or bladder control problems. ?You have unusual weakness or numbness in your arms or legs. ?You feel faint. ?You have a sudden increase in pain and swelling in your hip. ?Your hip is red or swollen or very tender to touch. ?

## 2021-12-26 ENCOUNTER — Encounter (HOSPITAL_COMMUNITY): Payer: Self-pay | Admitting: Emergency Medicine

## 2021-12-26 ENCOUNTER — Emergency Department (HOSPITAL_COMMUNITY): Payer: Medicare Other

## 2021-12-26 ENCOUNTER — Emergency Department (HOSPITAL_COMMUNITY)
Admission: EM | Admit: 2021-12-26 | Discharge: 2021-12-27 | Disposition: A | Discharge status: Home / Self Care | Payer: Medicare Other | Attending: Emergency Medicine | Admitting: Emergency Medicine

## 2021-12-26 ENCOUNTER — Other Ambulatory Visit: Payer: Self-pay

## 2021-12-26 DIAGNOSIS — S8001XA Contusion of right knee, initial encounter: Secondary | ICD-10-CM | POA: Insufficient documentation

## 2021-12-26 DIAGNOSIS — S8991XA Unspecified injury of right lower leg, initial encounter: Secondary | ICD-10-CM | POA: Diagnosis present

## 2021-12-26 DIAGNOSIS — M25552 Pain in left hip: Secondary | ICD-10-CM | POA: Diagnosis not present

## 2021-12-26 DIAGNOSIS — W01198A Fall on same level from slipping, tripping and stumbling with subsequent striking against other object, initial encounter: Secondary | ICD-10-CM | POA: Diagnosis not present

## 2021-12-26 MED ORDER — KETOROLAC TROMETHAMINE 15 MG/ML IJ SOLN
15.0000 mg | Freq: Once | INTRAMUSCULAR | Status: AC
Start: 2021-12-26 — End: 2021-12-26
  Administered 2021-12-26: 15 mg via INTRAMUSCULAR
  Filled 2021-12-26: qty 1

## 2021-12-26 NOTE — ED Provider Triage Note (Signed)
Emergency Medicine Provider Triage Evaluation Note ? ?[name] , a 71 y.o. female  was evaluated in triage.  Pt complains of continued left hip and left thigh pain from fall 1 week ago, and new fall today with mild abrasion to right knee and minor head tenderness.  Patient unable to elaborate further on mechanism of the fall.  No headache, N/V, dizziness lightheadedness, vision changes, or neck pain.  Was seen in the ED for first fall, imaging negative for acute changes.  Denies numbness, tingling, burning sensations of the lower extremities.  Denies chest pain or shortness of breath.  Prev hx of fracture + surgical intervention bilateral hips/femurs. ? ?Review of Systems  ?Positive: As above ?Negative: As above ? 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  Mild abrasion on right knee.  Left thigh and hip TTP.  Lower extremities appear neurovascularly intact ? ?Medical Decision Making  ?Medically screening exam initiated at 2:45 PM.  Appropriate orders placed.  TASHIMA SCARPULLA was informed that the remainder of the evaluation will be completed by another provider, this initial triage assessment does not replace that evaluation, and the importance of remaining in the ED until their evaluation is complete. ? ?  ?Cecil Cobbs, PA-C ?12/26/21 1500 ? ?

## 2021-12-26 NOTE — ED Provider Notes (Signed)
?[facility]-EMERGENCY DEPT ?Provider Note ? ? ?CSN: [id number] ?Arrival date & time: 12/26/21  1400 ? ?  ? ?History ? ?Chief Complaint  ?Patient presents with  ? Fall  ? Knee Pain  ? ? ?KADAJAH KJOS is a 71 y.o. female. ? ?HPI ?Patient complains of pain in the left hip after fall 10 days ago.  She also lost her balance today and fell injuring her right knee.  She uses a walker to get around.  She is currently homeless and staying at a local shelter.  She denies injury to head, neck or back. ? ?Home Medications ?Prior to Admission medications   ?Medication Sig Start Date End Date Taking? Authorizing Provider  ?albuterol (PROVENTIL HFA;VENTOLIN HFA) 108 (90 BASE) MCG/ACT inhaler Inhale 2 puffs into the lungs every 4 (four) hours as needed for wheezing or shortness of breath. ?Patient not taking: Reported on 07/08/2021    [provider]  ?amphetamine-dextroamphetamine (ADDERALL) 20 MG tablet Take 20 mg by mouth 2 (two) times daily.     [provider]  ?buPROPion (WELLBUTRIN XL) 150 MG 24 hr tablet Take 150 mg by mouth at bedtime.    [provider]  ?celecoxib (CELEBREX) 200 MG capsule Take 200 mg by mouth daily. 05/14/21   [provider]  ?cyproheptadine (PERIACTIN) 4 MG tablet Take 4 mg by mouth 3 (three) times daily. ?Patient not taking: Reported on 07/08/2021    [provider]  ?docusate sodium (COLACE) 100 MG capsule Take 1 capsule (100 mg total) by mouth 2 (two) times daily. 07/19/21   Lorin Glass, MD  ?ipratropium-albuterol (DUONEB) 0.5-2.5 (3) MG/3ML SOLN Take 3 mLs by nebulization every 6 (six) hours as needed. 07/19/21   Lorin Glass, MD  ?Lidocaine (HM LIDOCAINE PATCH) 4 % PTCH Apply 1 patch topically every 12 (twelve) hours as needed. 12/19/21   Haskel Schroeder, PA-C  ?LORazepam (ATIVAN) 1 MG tablet Take 1 tablet (1 mg total) by mouth 2 (two) times daily as needed for up to 3 days for anxiety ("Do not take with narcotic medications.  Must choose between one or the other."). 07/19/21 07/22/21  Lorin Glass, MD  ?mirtazapine (REMERON) 15 MG tablet Take 1 tablet (15 mg total) by mouth at bedtime for 3 days. 07/19/21 07/22/21  Lorin Glass, MD  ?polyethylene glycol (MIRALAX / GLYCOLAX) 17 g packet Take 17 g by mouth daily. 07/20/21   Lorin Glass, MD  ?venlafaxine XR (EFFEXOR-XR) 150 MG 24 hr capsule Take 150 mg by mouth daily with breakfast.    [provider]  ?   ? ?Allergies    ?Penicillins and Vancomycin   ? ?Review of Systems   ?Review of Systems ? ?Physical Exam ?Updated Vital Signs ?BP (!) 166/82   Pulse 60   Temp 98.3 ?F (36.8 ?C) (Oral)   Resp 16   SpO2 97%  ?Physical Exam ?Vitals and nursing note reviewed.  ?Constitutional:   ?   Appearance: She is well-developed. She is not ill-appearing.  ?   Comments: Elderly, frail  ?HENT:  ?   Head: Normocephalic and atraumatic.  ?   Right Ear: External ear normal.  ?   Left Ear: External ear normal.  ?Eyes:  ?   Conjunctiva/sclera: Conjunctivae normal.  ?   Pupils: Pupils are equal, round, and reactive to light.  ?Neck:  ?   Trachea: Phonation normal.  ?Cardiovascular:  ?   Rate and Rhythm: Normal rate.  ?Pulmonary:  ?   Effort:  Pulmonary effort is normal.  ?Abdominal:  ?   General: There is no distension.  ?Musculoskeletal:  ?   Cervical back: Normal range of motion and neck supple.  ?   Comments: She guards against movement of the left hip secondary to pain.  There is no left hip deformity.  Right anterior knee has a small abrasion and contusion.  She demonstrates normal range of motion of the right leg.  ?Skin: ?   General: Skin is warm and dry.  ?Neurological:  ?   Mental Status: She is alert and oriented to person, place, and time.  ?   Cranial Nerves: No cranial nerve deficit.  ?   Sensory: No sensory deficit.  ?   Motor: No abnormal muscle tone.  ?   Coordination: Coordination normal.  ?Psychiatric:     ?   Mood and Affect: Mood normal.     ?   Behavior: Behavior normal.     ?    Thought Content: Thought content normal.     ?   Judgment: Judgment normal.  ? ? ?ED Results / Procedures / Treatments   ?Labs ?(all labs ordered are listed, but only abnormal results are displayed) ?Labs Reviewed - No data to display ? ?EKG ?None ? ?Radiology ?CT Hip Left Wo Contrast ? ?Result Date: 12/26/2021 ?CLINICAL DATA:  Left hip pain after fall EXAM: CT OF THE LEFT HIP WITHOUT CONTRAST TECHNIQUE: Multidetector CT imaging of the left hip was performed according to the standard protocol. Multiplanar CT image reconstructions were also generated. RADIATION DOSE REDUCTION: This exam was performed according to the departmental dose-optimization program which includes automated exposure control, adjustment of the mA and/or kV according to patient size and/or use of iterative reconstruction technique. COMPARISON:  Radiographs 12/19/2021, 12/26/2021 FINDINGS: Bones/Joint/Cartilage Two threaded screws within the proximal left femur. The more inferior screw approaches the articular surface of the femoral head but does not breach it. No fracture or malalignment is seen. Pubic symphysis is intact. The left rami appear intact Ligaments Suboptimally assessed by CT. Muscles and Tendons Negative for intramuscular fluid collection or obvious mass. Soft tissues Negative IMPRESSION: Prior threaded screw fixation of the proximal left femur. No acute osseous abnormality Electronically Signed   By: Jasmine PangKim  Fujinaga M.D.   On: 12/26/2021 20:52   ? ?Procedures ?Procedures  ? ? ?Medications Ordered in ED ?Medications  ?ketorolac (TORADOL) 15 MG/ML injection 15 mg (15 mg Intramuscular Given 12/26/21 2058)  ? ? ?ED Course/ Medical Decision Making/ A&P ?  ?                        ?Medical Decision Making ?She presents for evaluation of left hip pain which is persistent since a fall 10 days ago.  She uses a rolling walker for ambulation.  She also injured her right knee, today, when she fell however this is not bothering her much. ? ?Problems  Addressed: ?Left hip pain: acute illness or injury ?   Details: Acute injury, 10 days ago, aggravated by standing and walking. ? ?Amount and/or Complexity of Data Reviewed ?Independent Historian:  ?   Details: She is a cogent historian ?Radiology: ordered and independent interpretation performed. ?   Details: CT left hip-Per orthopedic appliance no fracture or appliance malfunction. ? ?Risk ?Prescription drug management. ?Risk Details: Subacute injury left hip, CT negative for fracture or hardware malfunction.  No indication for hospitalization or further intervention from the ED setting.  Treat as contusion,  with symptomatic care and referred to orthopedics for follow-up care as needed.  Incidental mild right knee injury not requiring imaging or intervention. ? ? ? ? ? ? ? ? ? ? ?Final Clinical Impression(s) / ED Diagnoses ?Final diagnoses:  ?Left hip pain  ? ? ?Rx / DC Orders ?ED Discharge Orders   ? ? None  ? ?  ? ? ?  ?Mancel Bale, MD ?12/28/21 1145 ? ?

## 2021-12-26 NOTE — Discharge Instructions (Signed)
There is no sign of problems from the left hip.  You may have some arthritis there causing the pain.  For that you should use Tylenol every 4 hours.  Follow-up with the doctor of your choice if not better in 3 to 4 days. ?

## 2021-12-26 NOTE — ED Triage Notes (Signed)
Patient presents from the [facility] post fall today in which she now complains of right knee pain. She also reports hitting her head. She recently had a fall which caused hip and back pain.  ? ? ? ?EMS vitals: ?150/98 BP ?96% SPO2 on room air ?76 HR ?

## 2021-12-27 NOTE — ED Notes (Addendum)
Discharge instructions reviewed, questions answered. Rx education provided. Pt states understanding and no further questions. Pt had difficulty getting up to wheelchair, but was able to and wheeled to lobby upon discharge. No s/s of distress noted. ?

## 2022-03-03 ENCOUNTER — Other Ambulatory Visit: Payer: Self-pay

## 2022-03-03 ENCOUNTER — Emergency Department (HOSPITAL_COMMUNITY): Payer: Medicare Other

## 2022-03-03 ENCOUNTER — Encounter (HOSPITAL_COMMUNITY): Payer: Self-pay

## 2022-03-03 ENCOUNTER — Emergency Department (HOSPITAL_COMMUNITY)
Admission: EM | Admit: 2022-03-03 | Discharge: 2022-03-04 | Disposition: A | Discharge status: Home / Self Care | Payer: Medicare Other | Attending: Emergency Medicine | Admitting: Emergency Medicine

## 2022-03-03 DIAGNOSIS — W500XXA Accidental hit or strike by another person, initial encounter: Secondary | ICD-10-CM | POA: Diagnosis not present

## 2022-03-03 DIAGNOSIS — M542 Cervicalgia: Secondary | ICD-10-CM | POA: Diagnosis not present

## 2022-03-03 DIAGNOSIS — S0990XA Unspecified injury of head, initial encounter: Secondary | ICD-10-CM | POA: Insufficient documentation

## 2022-03-03 MED ORDER — ACETAMINOPHEN 325 MG PO TABS
650.0000 mg | ORAL_TABLET | Freq: Once | ORAL | Status: DC
Start: 2022-03-03 — End: 2022-03-04
  Filled 2022-03-03: qty 2
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
## 2022-03-03 NOTE — Discharge Instructions (Addendum)
Your CT scans did not show any fracture or acute injury.  Take Tylenol for pain.  Follow-up with your doctor within a week.

## 2022-03-03 NOTE — Progress Notes (Signed)
TOC CSW attempted to contact shelters in [facility].  None of the shelters contacted has a bed available.  CSW instructed pt to seek shelters early in the morning to complete assessments.  Dolly Harbach Tarpley-Carter, MSW, LCSW-A Pronouns:  She/Her/Hers [facility] Clinical Social Worker Direct Number:  [phone] [email or web address]

## 2022-03-03 NOTE — Progress Notes (Addendum)
Transition of Care [facility]) - Emergency Department Mini Assessment   Patient Details  Name: [name] MRN: [id number] Date of Birth: [birth year]  Transition of Care [facility]) CM/SW Contact:    Mikeala Girdler C Tarpley-Carter, LCSWA Phone Number: 03/03/2022, 11:18 PM   Clinical Narrative: [facility] CSW consulted with pt about her living conditions.  Pt stated she is homeless and was staying at the [facility].  Pt stated she wasn't going back there.  CSW explained the shelter process and that she would have to do her own assessments with the shelters.  Pt stated she did not understand.  CSW went over the information again with pt.  Damesha Lawler Tarpley-Carter, MSW, LCSW-A Pronouns:  She/Her/Hers [facility] Clinical Social Worker Direct Number:  [phone] [email or web address]      ED Mini Assessment: What brought you to the Emergency Department? : Pt is experiencing homelessness.  Barriers to Discharge: No Barriers Identified     Means of departure: Not know  Interventions which prevented an admission or readmission: Homeless Screening    Patient Contact and Communications       Contact Date: 03/03/22,          Patient states their goals for this hospitalization and ongoing recovery are:: Pt wants a shelter to go to. 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 Pharmacy:   [id number] [place], [place] - [address] AT [facility] OF [address] & [facility] [address] [place] [address] Phone: [phone] Fax: [phone]
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
## 2022-03-04 NOTE — ED Notes (Signed)
Patient refusing to leave.  States "I can't go.  I am in pain."  Informed patient that she was offered Tylenol and she refused it.  Patient informed again that she is ready for discharge. Patient states "so you are going to just put me out of the streets?"  Patient informed that she was able to stay in lobby and wait until morning.  Patient continues to refuse.  Security called to assist patient out.

## 2022-03-04 NOTE — ED Notes (Signed)
Patient refused vital signs at time of discharge.  Escorted to lobby by hospital security

## 2022-03-06 ENCOUNTER — Other Ambulatory Visit: Payer: Self-pay

## 2022-03-06 ENCOUNTER — Emergency Department (HOSPITAL_COMMUNITY): Payer: Medicare Other

## 2022-03-06 ENCOUNTER — Emergency Department (HOSPITAL_COMMUNITY)
Admission: EM | Admit: 2022-03-06 | Discharge: 2022-03-06 | Disposition: A | Discharge status: Home / Self Care | Payer: Medicare Other | Attending: Emergency Medicine | Admitting: Emergency Medicine

## 2022-03-06 ENCOUNTER — Encounter (HOSPITAL_COMMUNITY): Payer: Self-pay | Admitting: Emergency Medicine

## 2022-03-06 DIAGNOSIS — M25512 Pain in left shoulder: Secondary | ICD-10-CM

## 2022-03-06 DIAGNOSIS — M25511 Pain in right shoulder: Secondary | ICD-10-CM | POA: Diagnosis present

## 2022-03-06 DIAGNOSIS — Z96611 Presence of right artificial shoulder joint: Secondary | ICD-10-CM | POA: Diagnosis not present

## 2022-03-06 MED ORDER — MELOXICAM 15 MG PO TABS: 15.0000 mg | ORAL_TABLET | Freq: Every day | ORAL | 0 refills | Status: AC

## 2022-03-06 MED ORDER — DIPHENHYDRAMINE HCL 25 MG PO CAPS: 25.0000 mg | ORAL_CAPSULE | Freq: Once | ORAL | Status: DC

## 2022-03-06 MED ORDER — DIPHENHYDRAMINE HCL 50 MG/ML IJ SOLN: 25.0000 mg | Freq: Once | INTRAMUSCULAR | Status: DC

## 2022-03-06 MED ORDER — HYDROCODONE-ACETAMINOPHEN 5-325 MG PO TABS
1.0000 | ORAL_TABLET | Freq: Once | ORAL | Status: AC
  Administered 2022-03-06: 1 via ORAL
  Filled 2022-03-06: qty 1

## 2022-03-06 NOTE — Discharge Instructions (Addendum)
DislikeYou were seen today for shoulder pain.  Imaging was reassuring that there are no fractures patient is at this time.  I have prescribed meloxicam as an anti-inflammatory.  You may use ice as needed on your joints as well.  If you continue to have lip swelling I would recommend continued use of Benadryl.  If this does not improve please follow-up with your PCP.
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
## 2022-03-14 ENCOUNTER — Emergency Department (HOSPITAL_COMMUNITY): Payer: Medicare Other

## 2022-03-14 ENCOUNTER — Other Ambulatory Visit: Payer: Self-pay

## 2022-03-14 ENCOUNTER — Encounter (HOSPITAL_COMMUNITY): Payer: Self-pay

## 2022-03-14 ENCOUNTER — Emergency Department (HOSPITAL_COMMUNITY)
Admission: EM | Admit: 2022-03-14 | Discharge: 2022-03-14 | Disposition: A | Discharge status: Home / Self Care | Payer: Medicare Other | Attending: Emergency Medicine | Admitting: Emergency Medicine

## 2022-03-14 DIAGNOSIS — Z79899 Other long term (current) drug therapy: Secondary | ICD-10-CM | POA: Insufficient documentation

## 2022-03-14 DIAGNOSIS — W01198A Fall on same level from slipping, tripping and stumbling with subsequent striking against other object, initial encounter: Secondary | ICD-10-CM | POA: Insufficient documentation

## 2022-03-14 DIAGNOSIS — I10 Essential (primary) hypertension: Secondary | ICD-10-CM | POA: Insufficient documentation

## 2022-03-14 DIAGNOSIS — R296 Repeated falls: Secondary | ICD-10-CM | POA: Insufficient documentation

## 2022-03-14 DIAGNOSIS — M810 Age-related osteoporosis without current pathological fracture: Secondary | ICD-10-CM | POA: Diagnosis not present

## 2022-03-14 DIAGNOSIS — W19XXXA Unspecified fall, initial encounter: Secondary | ICD-10-CM

## 2022-03-14 DIAGNOSIS — M542 Cervicalgia: Secondary | ICD-10-CM | POA: Insufficient documentation

## 2022-03-14 DIAGNOSIS — R519 Headache, unspecified: Secondary | ICD-10-CM | POA: Insufficient documentation

## 2022-03-14 DIAGNOSIS — S0990XA Unspecified injury of head, initial encounter: Secondary | ICD-10-CM | POA: Diagnosis present

## 2022-03-14 LAB — CBC WITH DIFFERENTIAL/PLATELET
Abs Immature Granulocytes: 0.02 10*3/uL (ref 0.00–0.07)
Basophils Absolute: 0.1 10*3/uL (ref 0.0–0.1)
Basophils Relative: 2 %
Eosinophils Absolute: 0.3 10*3/uL (ref 0.0–0.5)
Eosinophils Relative: 4 %
HCT: 40.6 % (ref 36.0–46.0)
Hemoglobin: 13.1 g/dL (ref 12.0–15.0)
Immature Granulocytes: 0 %
Lymphocytes Relative: 26 %
Lymphs Abs: 1.8 10*3/uL (ref 0.7–4.0)
MCH: 31 pg (ref 26.0–34.0)
MCHC: 32.3 g/dL (ref 30.0–36.0)
MCV: 96 fL (ref 80.0–100.0)
Monocytes Absolute: 0.7 10*3/uL (ref 0.1–1.0)
Monocytes Relative: 10 %
Neutro Abs: 4.2 10*3/uL (ref 1.7–7.7)
Neutrophils Relative %: 58 %
Platelets: 336 10*3/uL (ref 150–400)
RBC: 4.23 MIL/uL (ref 3.87–5.11)
RDW: 14.4 % (ref 11.5–15.5)
WBC: 7.1 10*3/uL (ref 4.0–10.5)
nRBC: 0 % (ref 0.0–0.2)

## 2022-03-14 LAB — BASIC METABOLIC PANEL
Anion gap: 8 (ref 5–15)
BUN: 19 mg/dL (ref 8–23)
CO2: 21 mmol/L — ABNORMAL LOW (ref 22–32)
Calcium: 8.9 mg/dL (ref 8.9–10.3)
Chloride: 111 mmol/L (ref 98–111)
Creatinine, Ser: 0.66 mg/dL (ref 0.44–1.00)
GFR, Estimated: >60 mL/min (ref >60)
Glucose, Bld: 90 mg/dL (ref 70–99)
Potassium: 3.6 mmol/L (ref 3.5–5.1)
Sodium: 140 mmol/L (ref 135–145)

## 2022-03-14 MED ORDER — ACETAMINOPHEN 325 MG PO TABS
650.0000 mg | ORAL_TABLET | Freq: Once | ORAL | Status: AC
Start: 2022-03-14 — End: 2022-03-14
  Administered 2022-03-14: 650 mg via ORAL
  Filled 2022-03-14: qty 2

## 2022-03-14 MED ORDER — IBUPROFEN 200 MG PO TABS
600.0000 mg | ORAL_TABLET | Freq: Once | ORAL | Status: AC
  Administered 2022-03-14: 600 mg via ORAL
  Filled 2022-03-14: qty 3

## 2022-03-14 NOTE — ED Notes (Signed)
Pt ambulated approximately 25 feet at this time, well tolerated with use of personal walker.

## 2022-03-14 NOTE — Discharge Instructions (Addendum)
You were seen in the emergency department today for a fall.  The scan of your head and neck as well as your labs appear normal.  We spoke with social work who has requested that you call DSS for the Department of Social Services.  This has been attached your discharge paperwork.  Please also speak with [facility] regarding housing for you as they have services available to help with this.  Additionally the social worker states that you can call her at [phone].

## 2022-03-14 NOTE — ED Provider Notes (Signed)
  Face-to-face evaluation   History: She presents for evaluation of injury from fall that occurred when she stood up from a commode.  She states her balance caused her to fall.  No serious injuries in the fall.  She is staying at the [facility], because she is unhoused.  She reports being there for 2 months.  Physical exam: Elderly alert and cooperative.  She was able to sit up and cooperate with exam.  No focal deformities or limitation of movement of the large joints.  Head is tender posteriorly without palpable deformity or injury to the scalp.  MDM: Evaluation for  Chief Complaint  Patient presents with   Fall     Elderly female who is homeless, and presenting for help with placement and assistance for her multiple problems.  She does not have injuries requiring hospitalization.  Screening evaluation indicates that she is stable medically.  She requested social services and we will attempt to put her in contact with them.  She can also receive these at her current domicile, which is a center which helps people who are homeless and can allow them to stay there at times  Medical screening examination/treatment/procedure(s) were conducted as a shared visit with non-physician practitioner(s) and myself.  I personally evaluated the patient during the encounter    Mancel Bale, MD 03/15/22 [phone]

## 2022-03-14 NOTE — ED Provider Notes (Signed)
[facility]EMERGENCY DEPT Provider Note   CSN: [id number] Arrival date & time: 03/14/22  0556     History  Chief Complaint  Patient presents with   [name]    [name] is a 71 y.o. female.  With past medical history of hyperlipidemia, hypertension, osteoporosis, unhoused and staying at [facility] intermittently who presents to the emergency department with fall.  Limited historian.  Patient states that she was getting up to go to the bathroom earlier this morning.  States that when she stood up and turned around from using the restroom she fell striking the back of her head on the pavement.  She denies loss of consciousness.  Denies chest pain, shortness of breath, lightheadedness or dizziness prior to or after the fall.  She states that she is having intermittent double vision since falling.  But denies any nausea or vomiting.  She is not anticoagulated.  Denies other injuries.  She states that she does take trazodone, Ativan, venlafaxine, Wellbutrin.  She denies missing any doses or taking extra medication.  HPI     Home Medications Prior to Admission medications   Medication Sig Start Date End Date Taking? Authorizing Provider  albuterol (PROVENTIL HFA;VENTOLIN HFA) 108 (90 BASE) MCG/ACT inhaler Inhale 2 puffs into the lungs every 4 (four) hours as needed for wheezing or shortness of breath. Patient not taking: Reported on 07/08/2021    [provider]  amphetamine-dextroamphetamine (ADDERALL) 20 MG tablet Take 20 mg by mouth 2 (two) times daily.     [provider]  buPROPion (WELLBUTRIN XL) 150 MG 24 hr tablet Take 150 mg by mouth at bedtime.    [provider]  cyproheptadine (PERIACTIN) 4 MG tablet Take 4 mg by mouth 3 (three) times daily. Patient not taking: Reported on 07/08/2021    [provider]  docusate sodium (COLACE) 100 MG capsule Take 1 capsule (100 mg total) by mouth 2 (two) times daily. 07/19/21   Dahal, Melina Schools,  MD  ipratropium-albuterol (DUONEB) 0.5-2.5 (3) MG/3ML SOLN Take 3 mLs by nebulization every 6 (six) hours as needed. 07/19/21   Dahal, Melina Schools, MD  Lidocaine (HM LIDOCAINE PATCH) 4 % PTCH Apply 1 patch topically every 12 (twelve) hours as needed. 12/19/21   Haskel Schroeder, PA-C  LORazepam (ATIVAN) 1 MG tablet Take 1 tablet (1 mg total) by mouth 2 (two) times daily as needed for up to 3 days for anxiety ("Do not take with narcotic medications. Must choose between one or the other."). 07/19/21 07/22/21  Lorin Glass, MD  meloxicam (MOBIC) 15 MG tablet Take 1 tablet (15 mg total) by mouth daily. 03/06/22 04/05/22  Darrick Grinder, PA-C  mirtazapine (REMERON) 15 MG tablet Take 1 tablet (15 mg total) by mouth at bedtime for 3 days. 07/19/21 07/22/21  Dahal, Melina Schools, MD  polyethylene glycol (MIRALAX / GLYCOLAX) 17 g packet Take 17 g by mouth daily. 07/20/21   Lorin Glass, MD  venlafaxine XR (EFFEXOR-XR) 150 MG 24 hr capsule Take 150 mg by mouth daily with breakfast.    [provider]      Allergies    Penicillins and Vancomycin    Review of Systems   Review of Systems  Eyes:  Positive for visual disturbance.  Musculoskeletal:  Positive for neck pain.  Neurological:  Positive for headaches.  All other systems reviewed and are negative.   Physical Exam Updated Vital Signs BP (!) 149/80   Pulse 68   Temp 97.8 F (36.6 C) (  Oral)   Resp 16   SpO2 96%  Physical Exam Vitals and nursing note reviewed.  Constitutional:      General: She is not in acute distress.    Appearance: Normal appearance. She is not ill-appearing.  HENT:     Head: Normocephalic and atraumatic.     Nose: Nose normal.     Mouth/Throat:     Pharynx: Oropharynx is clear.     Comments: Poor dentition, no dental fractures or avulsions  Eyes:     General: No scleral icterus.    Extraocular Movements: Extraocular movements intact.     Conjunctiva/sclera: Conjunctivae normal.     Pupils: Pupils are equal, round,  and reactive to light.  Neck:     Comments: C-collar in place Cardiovascular:     Rate and Rhythm: Normal rate.     Pulses: Normal pulses.     Heart sounds: Normal heart sounds. No murmur heard. Pulmonary:     Effort: Pulmonary effort is normal. No respiratory distress.  Abdominal:     General: Abdomen is flat. Bowel sounds are normal.     Palpations: Abdomen is soft.     Tenderness: There is no abdominal tenderness.  Musculoskeletal:        General: No deformity or signs of injury. Normal range of motion.     Cervical back: Tenderness present. Spinous process tenderness present.  Skin:    General: Skin is warm and dry.     Capillary Refill: Capillary refill takes less than 2 seconds.     Findings: No bruising or rash.  Neurological:     General: No focal deficit present.     Mental Status: She is alert and oriented to person, place, and time. Mental status is at baseline.     Cranial Nerves: No cranial nerve deficit.     Sensory: No sensory deficit.     Motor: No weakness.  Psychiatric:        Mood and Affect: Mood normal.        Behavior: Behavior normal.        Thought Content: Thought content normal.        Judgment: Judgment normal.     ED Results / Procedures / Treatments   Labs (all labs ordered are listed, but only abnormal results are displayed) Labs Reviewed  BASIC METABOLIC PANEL - Abnormal; Notable for the following components:      Result Value   CO2 21 (*)    All other components within normal limits  CBC WITH DIFFERENTIAL/PLATELET   EKG EKG Interpretation  Date/Time:  Friday March 14 2022 06:44:35 EDT Ventricular Rate:  79 PR Interval:  130 QRS Duration: 82 QT Interval:  411 QTC Calculation: 472 R Axis:   50 Text Interpretation: Sinus rhythm Anteroseptal infarct, old Minimal ST depression, lateral leads No acute changes Confirmed by Drema Pry [phone]) on 03/14/2022 6:47:24 AM  Radiology CT Head Wo Contrast  Result Date: 03/14/2022 CLINICAL  DATA:  71 year old female history of trauma from a fall over backwards striking head onto pavement. EXAM: CT HEAD WITHOUT CONTRAST CT CERVICAL SPINE WITHOUT CONTRAST TECHNIQUE: Multidetector CT imaging of the head and cervical spine was performed following the standard protocol without intravenous contrast. Multiplanar CT image reconstructions of the cervical spine were also generated. RADIATION DOSE REDUCTION: This exam was performed according to the departmental dose-optimization program which includes automated exposure control, adjustment of the mA and/or kV according to patient size and/or use of iterative reconstruction technique. COMPARISON:  CT head and cervical spine 03/03/2022. FINDINGS: CT HEAD FINDINGS Brain: Mild cerebral atrophy. Patchy and confluent areas of decreased attenuation are noted throughout the deep and periventricular white matter of the cerebral hemispheres bilaterally, compatible with chronic microvascular ischemic disease. No evidence of acute infarction, hemorrhage, hydrocephalus, extra-axial collection or mass lesion/mass effect. Vascular: No hyperdense vessel or unexpected calcification. Skull: Normal. Negative for fracture or focal lesion. Sinuses/Orbits: No acute finding. Other: None. CT CERVICAL SPINE FINDINGS Alignment: 3 mm of anterolisthesis of C3 upon C4 and C4 upon C5, unchanged. Skull base and vertebrae: Postoperative changes of ACDF from C5-T1, anterior fusion of C4 and C5, and posterior fusion from C4-T1, similar to the prior study. Throughout this region, there is osteopenia in the associated vertebral bodies, similar to prior examinations. No definite acute displaced fracture identified on today's examination. Soft tissues and spinal canal: No prevertebral fluid or swelling. No visible canal hematoma. Disc levels: Postoperative changes of spinal fusion, as above. Extensive multilevel degenerative disc disease, most severe at T1-T2. Severe multilevel facet arthropathy  bilaterally. Upper chest: Unremarkable. Other: None. IMPRESSION: 1. No evidence of significant acute traumatic injury to the skull, brain or cervical spine. 2. Mild cerebral atrophy with chronic microvascular ischemic changes in the cerebral white matter, as above. 3. Chronic postoperative and degenerative changes in the cervical spine redemonstrated, as above. Electronically Signed   By: Trudie Reed M.D.   On: 03/14/2022 07:27   CT Cervical Spine Wo Contrast  Result Date: 03/14/2022 CLINICAL DATA:  71 year old female history of trauma from a fall over backwards striking head onto pavement. EXAM: CT HEAD WITHOUT CONTRAST CT CERVICAL SPINE WITHOUT CONTRAST TECHNIQUE: Multidetector CT imaging of the head and cervical spine was performed following the standard protocol without intravenous contrast. Multiplanar CT image reconstructions of the cervical spine were also generated. RADIATION DOSE REDUCTION: This exam was performed according to the departmental dose-optimization program which includes automated exposure control, adjustment of the mA and/or kV according to patient size and/or use of iterative reconstruction technique. COMPARISON:  CT head and cervical spine 03/03/2022. FINDINGS: CT HEAD FINDINGS Brain: Mild cerebral atrophy. Patchy and confluent areas of decreased attenuation are noted throughout the deep and periventricular white matter of the cerebral hemispheres bilaterally, compatible with chronic microvascular ischemic disease. No evidence of acute infarction, hemorrhage, hydrocephalus, extra-axial collection or mass lesion/mass effect. Vascular: No hyperdense vessel or unexpected calcification. Skull: Normal. Negative for fracture or focal lesion. Sinuses/Orbits: No acute finding. Other: None. CT CERVICAL SPINE FINDINGS Alignment: 3 mm of anterolisthesis of C3 upon C4 and C4 upon C5, unchanged. Skull base and vertebrae: Postoperative changes of ACDF from C5-T1, anterior fusion of C4 and C5, and  posterior fusion from C4-T1, similar to the prior study. Throughout this region, there is osteopenia in the associated vertebral bodies, similar to prior examinations. No definite acute displaced fracture identified on today's examination. Soft tissues and spinal canal: No prevertebral fluid or swelling. No visible canal hematoma. Disc levels: Postoperative changes of spinal fusion, as above. Extensive multilevel degenerative disc disease, most severe at T1-T2. Severe multilevel facet arthropathy bilaterally. Upper chest: Unremarkable. Other: None. IMPRESSION: 1. No evidence of significant acute traumatic injury to the skull, brain or cervical spine. 2. Mild cerebral atrophy with chronic microvascular ischemic changes in the cerebral white matter, as above. 3. Chronic postoperative and degenerative changes in the cervical spine redemonstrated, as above. Electronically Signed   By: Trudie Reed M.D.   On: 03/14/2022 07:27    Procedures Procedures  Medications Ordered in ED Medications  ibuprofen (ADVIL) tablet 600 mg (600 mg Oral Given 03/14/22 1042)  acetaminophen (TYLENOL) tablet 650 mg (650 mg Oral Given 03/14/22 1042)    ED Course/ Medical Decision Making/ A&P                           Medical Decision Making Amount and/or Complexity of Data Reviewed Labs: ordered. Radiology: ordered.  Risk OTC drugs.   This patient presents to the ED for concern of fall, this involves an extensive number of treatment options, and is a complaint that carries with it a high risk of complications and morbidity.  The differential diagnosis includes mechanical fall, arrhythmia, hyper/hyponatremia, other electrolyte derangement, syncope, hypoglycemia, orthostatic hypotension, etc.   Co morbidities that complicate the patient evaluation Osteoporosis, HTN   Additional history obtained:  Additional history obtained from: none  External records from outside source obtained and reviewed including: most  recent 2 ED physician notes   EKG: EKG: normal EKG, normal sinus rhythm.   Lab Results: I personally ordered, reviewed, and interpreted labs. Pertinent results include: BMP within normal limits CBC within normal limits  Imaging Studies ordered:  I ordered imaging studies which included CT.  I independently reviewed & interpreted imaging & am in agreement with radiology impression. Imaging shows: CT Head negative for acute findings CT C-Spine negative for acute findings  Medications  I ordered medication including Tylenol and Motrin for pain Reevaluation of the patient after medication shows that patient improved -I reviewed the patient's home medications and did not make adjustments. -I did not prescribe new home medications.  Tests Considered: Testing ordered  Critical Interventions: None required  Consultations: None required  [facility] Un[facility] - currently staying at [facility]   ED Course:  71 year old female who presents to the emergency department after likely mechanical fall.  She denies any lightheadedness, dizziness, syncope, chest pain or shortness of breath prior to or after her fall.  On my physical exam she is initially in a c-collar with cervical spine tenderness.  There are no other abnormalities.  She is alert and oriented.  Not anticoagulated.  We will obtain basic labs, EKG, imaging.  CT of the head and C-spine are negative for acute fractures, malalignment.  Her c-collar was cleared after this. EKG without arrhythmia to contribute to her fall. CBC and BMP are normal.  Doubt that hyper hyponatremia, hypoglycemia is a reason for her fall.  Appears euvolemic and not dehydrated.  Denies any alcohol or drug use.  Does not appear to be intoxicated at this time.  Likely mechanical fall.  She does use a walker at baseline.  She has a history of falls.  No organic causes found to contribute.  Her imaging is normal.  She was able to ambulate here in the emergency department  without assistance at her baseline with her walker.  We will discharge with return precautions.  She did request to speak to social work regarding her homelessness and staying at [facility].Marland Kitchen  This was consulted for and I spoke with [place], LCSW who has provided her DSS and [facility] information and resources.  She also gave me permission to give the patient her social work number to call and speak with her.  I have placed all this in her discharge paperwork.  After consideration of the diagnostic results and the patients response to treatment, I feel that the patent would benefit from discharge. The patient has been appropriately medically  screened and/or stabilized in the ED. I have low suspicion for any other emergent medical condition which would require further screening, evaluation or treatment in the ED or require inpatient management. The patient is overall well appearing and non-toxic in appearance. They are hemodynamically stable at time of discharge.   Final Clinical Impression(s) / ED Diagnoses Final diagnoses:  Fall, initial encounter    Rx / DC Orders ED Discharge Orders     None         Cristopher Peruutry, Derrion Tritz E, PA-C 03/14/22 1345    Mancel BaleWentz, Elliott, MD 03/15/22 [phone]

## 2022-03-14 NOTE — ED Triage Notes (Signed)
Patient BIB EMS for mechanical fall . Fell backwards from her walker on pavement . Hit her head . Denies LOC , dizziness ,chest pain but complains of headache .Patient states she not on blood thinners . Hematoma noted to back of her head. No hemorrage noted. C-collar placed by EMS. Complains of neck pain from previous trauma .

## 2022-03-26 ENCOUNTER — Encounter: Payer: Self-pay | Admitting: *Deleted
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
## 2022-07-07 ENCOUNTER — Emergency Department (HOSPITAL_COMMUNITY): Payer: Medicare Other

## 2022-07-07 ENCOUNTER — Emergency Department (HOSPITAL_COMMUNITY)
Admission: EM | Admit: 2022-07-07 | Discharge: 2022-07-07 | Disposition: A | Discharge status: Home / Self Care | Payer: Medicare Other | Attending: Emergency Medicine | Admitting: Emergency Medicine

## 2022-07-07 ENCOUNTER — Other Ambulatory Visit: Payer: Self-pay

## 2022-07-07 ENCOUNTER — Encounter (HOSPITAL_COMMUNITY): Payer: Self-pay

## 2022-07-07 DIAGNOSIS — M25551 Pain in right hip: Secondary | ICD-10-CM | POA: Diagnosis not present

## 2022-07-07 DIAGNOSIS — W19XXXA Unspecified fall, initial encounter: Secondary | ICD-10-CM | POA: Insufficient documentation

## 2022-07-07 DIAGNOSIS — M25561 Pain in right knee: Secondary | ICD-10-CM | POA: Diagnosis present

## 2022-07-07 MED ORDER — OXYCODONE-ACETAMINOPHEN 5-325 MG PO TABS
1.0000 | ORAL_TABLET | Freq: Once | ORAL | Status: AC
  Administered 2022-07-07: 1 via ORAL
  Filled 2022-07-07: qty 1

## 2022-07-07 MED ORDER — DICLOFENAC SODIUM 1 % EX GEL
2.0000 g | Freq: Four times a day (QID) | CUTANEOUS | 0 refills | Status: DC
Start: 2022-07-07 — End: 2023-07-27

## 2022-07-07 NOTE — Discharge Instructions (Signed)
Follow-up with your primary care provider for recheck.  Can apply diclofenac as needed as prescribed.
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
## 2022-07-07 NOTE — ED Notes (Signed)
Safe Transport has been contacted for pt to be returned to [facility].

## 2022-07-07 NOTE — ED Triage Notes (Signed)
Per EMS- Patient is homeless. Patient was sleeping on the sidewalk in front of [facility]. Patient states that she got up to go to the bathroom and fell.  Patient c/o right hip pain and right knee pain. Patient has shortening and rotation of the right foot. + pedal pulse.  Patient was sitting on the sidewalk when [name] arrived.

## 2022-08-11 ENCOUNTER — Emergency Department (HOSPITAL_COMMUNITY): Payer: Medicare Other

## 2022-08-11 ENCOUNTER — Other Ambulatory Visit: Payer: Self-pay

## 2022-08-11 ENCOUNTER — Emergency Department (HOSPITAL_COMMUNITY)
Admission: EM | Admit: 2022-08-11 | Discharge: 2022-08-11 | Disposition: A | Discharge status: Home / Self Care | Payer: Medicare Other | Attending: Emergency Medicine | Admitting: Emergency Medicine

## 2022-08-11 ENCOUNTER — Encounter (HOSPITAL_COMMUNITY): Payer: Self-pay

## 2022-08-11 DIAGNOSIS — R0602 Shortness of breath: Secondary | ICD-10-CM | POA: Diagnosis not present

## 2022-08-11 DIAGNOSIS — R0789 Other chest pain: Secondary | ICD-10-CM | POA: Diagnosis not present

## 2022-08-11 DIAGNOSIS — R110 Nausea: Secondary | ICD-10-CM | POA: Diagnosis not present

## 2022-08-11 DIAGNOSIS — R079 Chest pain, unspecified: Secondary | ICD-10-CM

## 2022-08-11 LAB — CBC WITH DIFFERENTIAL/PLATELET
Abs Immature Granulocytes: 0.02 10*3/uL (ref 0.00–0.07)
Basophils Absolute: 0.1 10*3/uL (ref 0.0–0.1)
Basophils Relative: 1 %
Eosinophils Absolute: 0.2 10*3/uL (ref 0.0–0.5)
Eosinophils Relative: 3 %
HCT: 38.8 % (ref 36.0–46.0)
Hemoglobin: 12.6 g/dL (ref 12.0–15.0)
Immature Granulocytes: 0 %
Lymphocytes Relative: 30 %
Lymphs Abs: 1.8 10*3/uL (ref 0.7–4.0)
MCH: 31.8 pg (ref 26.0–34.0)
MCHC: 32.5 g/dL (ref 30.0–36.0)
MCV: 98 fL (ref 80.0–100.0)
Monocytes Absolute: 0.7 10*3/uL (ref 0.1–1.0)
Monocytes Relative: 12 %
Neutro Abs: 3.3 10*3/uL (ref 1.7–7.7)
Neutrophils Relative %: 54 %
Platelets: 348 10*3/uL (ref 150–400)
RBC: 3.96 MIL/uL (ref 3.87–5.11)
RDW: 14.3 % (ref 11.5–15.5)
WBC: 6.1 10*3/uL (ref 4.0–10.5)
nRBC: 0 % (ref 0.0–0.2)

## 2022-08-11 LAB — BASIC METABOLIC PANEL
Anion gap: 14 (ref 5–15)
BUN: 19 mg/dL (ref 8–23)
CO2: 20 mmol/L — ABNORMAL LOW (ref 22–32)
Calcium: 8.9 mg/dL (ref 8.9–10.3)
Chloride: 106 mmol/L (ref 98–111)
Creatinine, Ser: 0.66 mg/dL (ref 0.44–1.00)
GFR, Estimated: >60 mL/min (ref >60)
Glucose, Bld: 95 mg/dL (ref 70–99)
Potassium: 4 mmol/L (ref 3.5–5.1)
Sodium: 140 mmol/L (ref 135–145)

## 2022-08-11 LAB — D-DIMER, QUANTITATIVE: D-Dimer, Quant: 0.34 ug/mL-FEU (ref 0.00–0.50)

## 2022-08-11 LAB — BRAIN NATRIURETIC PEPTIDE: B Natriuretic Peptide: 128.8 pg/mL — ABNORMAL HIGH (ref 0.0–100.0)

## 2022-08-11 LAB — TROPONIN I (HIGH SENSITIVITY)
Troponin I (High Sensitivity): 8 ng/L (ref <18)
Troponin I (High Sensitivity): 8 ng/L (ref <18)

## 2022-08-11 MED ORDER — ACETAMINOPHEN 500 MG PO TABS
1000.0000 mg | ORAL_TABLET | Freq: Once | ORAL | Status: AC
  Administered 2022-08-11: 1000 mg via ORAL
  Filled 2022-08-11: qty 2

## 2022-08-11 MED ORDER — NITROGLYCERIN 0.4 MG SL SUBL
0.4000 mg | SUBLINGUAL_TABLET | Freq: Once | SUBLINGUAL | Status: AC
Start: 2022-08-11 — End: 2022-08-11
  Administered 2022-08-11: 0.4 mg via SUBLINGUAL
  Filled 2022-08-11: qty 1

## 2022-08-11 NOTE — ED Provider Triage Note (Signed)
Emergency Medicine Provider Triage Evaluation Note  [name] , a 71 y.o. female  was evaluated in triage.  Pt complains of chest pain that began 2 hours PTA, woke her from sleep.  States it feels "like a warming sensation".  No cardiac hx.  Review of Systems  Positive: Chest pain Negative: fever  Physical Exam  BP (!) 169/76   Pulse (!) 110   Temp 97.8 F (36.6 C) (Oral)   Resp 18   SpO2 99%  Gen:   Awake, no distress   Resp:  Normal effort  MSK:   Moves extremities without difficulty  Other:    Medical Decision Making  Medically screening exam initiated at 2:59 AM.  Appropriate orders placed.  [name] was informed that the remainder of the evaluation will be completed by another provider, this initial triage assessment does not replace that evaluation, and the importance of remaining in the ED until their evaluation is complete.  Chest pain.  EKG, labs, CXR.   Annette Hatchet, PA-C 08/11/22 0300

## 2022-08-11 NOTE — ED Triage Notes (Signed)
PER EMS:  pt was picked up from the [facility] with c/o central CP that radiates to left arm, onset tonight at 0100. She received 324 of aspirin.   BP- 162/96, HR-64, O2-96% RA

## 2022-08-11 NOTE — ED Notes (Signed)
Patient verbalizes understanding of discharge instructions. Opportunity for questioning and answers were provided. Pt discharged from ED. 

## 2022-08-11 NOTE — ED Provider Notes (Signed)
[facility] EMERGENCY DEPARTMENT Provider Note   CSN: [id number] Arrival date & time: 08/11/22  0246     History  Chief Complaint  Patient presents with   Chest Pain    [name] is a 71 y.o. female.   Chest Pain  The patient is a 71 year old female with past medical history of depression, and chronic pain presenting for evaluation of chest pain which began acutely last night.  The patient states that she was woke from sleep at approximately 0200 this morning with sharp left-sided chest pain which radiated to her neck and left arm.  She had associated nausea without vomiting or diaphoresis.  She is also endorsing shortness of breath which has been constant sometime and is improved with sitting upright.  She denies recent fevers, vomiting, abdominal pain, dizziness, lower extremity swelling, cough, or sputum production.   Home Medications Prior to Admission medications   Medication Sig Start Date End Date Taking? Authorizing Provider  albuterol (PROVENTIL HFA;VENTOLIN HFA) 108 (90 BASE) MCG/ACT inhaler Inhale 2 puffs into the lungs every 4 (four) hours as needed for wheezing or shortness of breath.    [provider]  buPROPion (WELLBUTRIN XL) 150 MG 24 hr tablet Take 150 mg by mouth every morning.    [provider]  diclofenac Sodium (VOLTAREN) 1 % GEL Apply 2 g topically 4 (four) times daily. 07/07/22   Jeannie Fend, PA-C  ibuprofen (ADVIL) 200 MG tablet Take 600 mg by mouth every 6 (six) hours.    [provider]  Lidocaine (HM LIDOCAINE PATCH) 4 % PTCH Apply 1 patch topically every 12 (twelve) hours as needed. Patient not taking: Reported on 03/14/2022 12/19/21   Haskel Schroeder, PA-C  LORazepam (ATIVAN) 1 MG tablet Take 1 tablet (1 mg total) by mouth 2 (two) times daily as needed for up to 3 days for anxiety ("Do not take with narcotic medications. Must choose between one or the other."). Patient taking differently: Take 1  mg by mouth at bedtime. 07/19/21 03/31/22  Lorin Glass, MD  Multiple Vitamin (MULTIVITAMIN WITH MINERALS) TABS tablet Take 1 tablet by mouth daily.    [provider]  traZODone (DESYREL) 100 MG tablet Take 100-200 mg by mouth at bedtime. 02/17/22   [provider]  venlafaxine XR (EFFEXOR-XR) 150 MG 24 hr capsule Take 150 mg by mouth at bedtime.    [provider]      Allergies    Penicillins and Vancomycin    Review of Systems   Review of Systems  Cardiovascular:  Positive for chest pain.  See HPI  Physical Exam Updated Vital Signs BP (!) 146/84   Pulse 60   Temp 98.3 F (36.8 C) (Oral)   Resp 12   SpO2 100%  Physical Exam Vitals and nursing note reviewed.  Constitutional:      General: She is not in acute distress.    Appearance: She is well-developed.  HENT:     Head: Normocephalic and atraumatic.  Eyes:     Conjunctiva/sclera: Conjunctivae normal.  Cardiovascular:     Rate and Rhythm: Normal rate and regular rhythm.     Heart sounds: No murmur heard. Pulmonary:     Effort: Pulmonary effort is normal. No respiratory distress.     Breath sounds: Normal breath sounds.  Abdominal:     Palpations: Abdomen is soft.     Tenderness: There is no abdominal tenderness.  Musculoskeletal:  General: No swelling.     Cervical back: Neck supple.  Skin:    General: Skin is warm and dry.     Capillary Refill: Capillary refill takes less than 2 seconds.  Neurological:     Mental Status: She is alert.  Psychiatric:        Mood and Affect: Mood normal.     ED Results / Procedures / Treatments   Labs (all labs ordered are listed, but only abnormal results are displayed) Labs Reviewed  BASIC METABOLIC PANEL - Abnormal; Notable for the following components:      Result Value   CO2 20 (*)    All other components within normal limits  BRAIN NATRIURETIC PEPTIDE - Abnormal; Notable for the following components:   B Natriuretic Peptide 128.8 (*)     All other components within normal limits  CBC WITH DIFFERENTIAL/PLATELET  D-DIMER, QUANTITATIVE  TROPONIN I (HIGH SENSITIVITY)  TROPONIN I (HIGH SENSITIVITY)    EKG EKG Interpretation  Date/Time:  Monday August 11 2022 10:05:35 EST Ventricular Rate:  63 PR Interval:  113 QRS Duration: 73 QT Interval:  443 QTC Calculation: 454 R Axis:   39 Text Interpretation: Sinus rhythm Borderline short PR interval Probable left atrial enlargement Anteroseptal infarct, old Nonspecific T abnormalities, lateral leads no significant change since earlier in the day Confirmed by Pricilla Loveless [phone]) on 08/11/2022 10:07:36 AM  Radiology DG Chest 2 View  Result Date: 08/11/2022 CLINICAL DATA:  Chest pain EXAM: CHEST - 2 VIEW COMPARISON:  07/07/2022 FINDINGS: Cardiac shadow is within normal limits. Bilateral calcified breast implants are seen. No focal infiltrate or effusion is noted. Degenerative changes of the thoracic spine are seen. Additionally multiple compression deformities are noted stable from prior CT examination from 2022. IMPRESSION: No acute abnormality noted. Electronically Signed   By: Alcide Clever M.D.   On: 08/11/2022 03:39    Procedures Procedures    Medications Ordered in ED Medications  acetaminophen (TYLENOL) tablet 1,000 mg (1,000 mg Oral Given 08/11/22 0906)  nitroGLYCERIN (NITROSTAT) SL tablet 0.4 mg (0.4 mg Sublingual Given 08/11/22 0906)    ED Course/ Medical Decision Making/ A&P           HEART Score: 5                Medical Decision Making The patient is a 71 year old female with past medical history of depression and chronic pain presenting for evaluation of left-sided chest pain which began acutely last night.  The differential diagnosis considered includes: ACS, PE, CHF, pneumonia, viral URI, costochondritis, trauma, pneumothorax.  On initial evaluation, the patient was hemodynamically stable and afebrile.  There were no significant findings on physical  exam.  The patient received 324 mg aspirin with EMS.  The patient's diagnostic workup included a chest x-ray which did not show any signs of rib fracture, pneumothorax, pneumonia, or other cardiopulmonary abnormality.  She also received serial troponins which were 8 and 8 respectively; a CBC with normal white blood cell count and hemoglobin; and CMP without significant abnormality.  The patient also received an EKG which showed normal sinus rhythm with nonspecific ST segment changes consistent with prior EKGs, without ischemic changes.  Based on the patient's history, physical exam, and diagnostic workup to include negative troponins, nonischemic EKG, and negative chest x-ray I doubt ACS, pneumonia, CHF pneumothorax, or trauma at this time.  The patient is also not tachycardic, tachypneic, or hypoxic and there are no signs of lower extremity swelling, making PE less  likely.  When notified that she was being discharged, the patient became tearful stating that she did not want to go back to the [facility].  The patient was provided with resources to alternate shelters in the area.  She was discharged with a referral to cardiology for management of her nonspecific chest pain and instructions to follow-up with her PCP in 24-48 hours.  She was given strict return precautions to the emergency department.  Amount and/or Complexity of Data Reviewed Independent Historian: EMS Labs: ordered. Decision-making details documented in ED Course. Radiology: ordered and independent interpretation performed. Decision-making details documented in ED Course. ECG/medicine tests: ordered and independent interpretation performed. Decision-making details documented in ED Course.  Risk OTC drugs. Prescription drug management.   Patient's presentation is most consistent with acute complicated illness / injury requiring diagnostic workup.         Final Clinical Impression(s) / ED Diagnoses Final diagnoses:  Nonspecific  chest pain    Rx / DC Orders ED Discharge Orders          Ordered    Ambulatory referral to Cardiology       Comments: If you have not heard from the Cardiology office within the next 72 hours please call [phone].   08/11/22 1012              Knox Saliva, MD 08/11/22 1543    Pricilla Loveless, MD 08/19/22 1513

## 2022-08-18 ENCOUNTER — Encounter: Payer: Self-pay | Admitting: *Deleted

## 2022-08-18 NOTE — Congregational Nurse Program (Signed)
  Dept: [phone]   Congregational Nurse Program Note  Date of Encounter: 08/18/2022  Past Medical History: Past Medical History:  Diagnosis Date   Anxiety    Arthritis    back, joints   Asthma    Chronic pain    Constipation    Depression    Dysrhythmia    Palpitations   GERD (gastroesophageal reflux disease)    High cholesterol    Hypertension    resolved whn she stopped drinking alcohol   Mitral prolapse    dx as a child   Osteopenia    Osteoporosis     Encounter Details:  CNP Questionnaire - 08/18/22 0957       Questionnaire   Ask client: Do you give verbal consent for me to treat you today? Yes    Student Assistance [employer]    Location Patient Served  [facility]    Visit Setting with Client Organization;Phone/Text/Email    Patient Status [employer];Medicare    Insurance/Financial Assistance Referral N/A    Medication N/A    Medical Provider Yes    Screening Referrals Made N/A    Medical Referrals Made Non-[facility] PCP/Clinic    Medical Appointment Made Non-[facility] PCP/clinic    Recently w/o PCP, now 1st time PCP visit completed due to CNs referral or appointment made N/A    Food N/A    Transportation Need transportation assistance;Provided transportation assistance    Housing/Utilities No permanent housing    Interpersonal Safety Do not feel safe at current residence    Interventions Advocate/Support;Navigate Healthcare System    Abnormal to Normal Screening Since Last CN Visit N/A    Screenings CN Performed N/A    Sent Client to Lab for: N/A    Did client attend any of the following based off CNs referral or appointments made? N/A    ED Visit Averted N/A    Life-Saving Intervention Made N/A            Client seen as follow up from [employer] visit. Client is wanting to live in an Assisted Living Facility. She is currently homeless and reports she should be getting a pallet shelter starting tonight. Contacted [facility] as client reports she has  been there in the past. Client reports her insurance in changing in January from [place] to [employer]. Per [facility], they will not accept [employer] and they do not have any openings until January. Contacted [facility] and made an appt for Friday Dec 8th at 9:00 with Debria Garret NP ([address] [place] [address]). Requested appt for physical and FL2 for possible Assisted Living Referral. [facility] does not have any transportation openings that day and client has not received her new insurance cards yet as she has lost them. Set up a ride through [place] with [facility] and writer will assist client with appt. Shota Kohrs W RN CN

## 2022-08-22 ENCOUNTER — Encounter: Payer: Self-pay | Admitting: *Deleted

## 2022-08-22 NOTE — Congregational Nurse Program (Signed)
  Dept: [phone]   Congregational Nurse Program Note  Date of Encounter: 08/22/2022  Past Medical History: Past Medical History:  Diagnosis Date   Anxiety    Arthritis    back, joints   Asthma    Chronic pain    Constipation    Depression    Dysrhythmia    Palpitations   GERD (gastroesophageal reflux disease)    High cholesterol    Hypertension    resolved whn she stopped drinking alcohol   Mitral prolapse    dx as a child   Osteopenia    Osteoporosis     Encounter Details:  CNP Questionnaire - 08/22/22 1137       Questionnaire   Ask client: Do you give verbal consent for me to treat you today? Yes    Student Assistance N/A    Location Patient Served  [facility]    Visit Setting with Client Organization;Phone/Text/Email    Patient Status [employer];Medicare    Insurance/Financial Assistance Referral N/A    Medication N/A    Medical Provider Yes    Screening Referrals Made N/A    Medical Referrals Made N/A    Medical Appointment Made N/A    Recently w/o PCP, now 1st time PCP visit completed due to CNs referral or appointment made Yes    Food N/A    Transportation Provided transportation assistance;Need transportation assistance    Housing/Utilities No permanent housing    Interpersonal Safety N/A    Interventions Advocate/Support    Abnormal to Normal Screening Since Last CN Visit N/A    Screenings CN Performed N/A    Sent Client to Lab for: N/A    Did client attend any of the following based off CNs referral or appointments made? Yes;Transportation;Medical    ED Visit Averted N/A    Life-Saving Intervention Made N/A           Client went to appointment with [facility] today and was provided transportation with [employer]. CN assisted client to appt and assisted with transportation. Adult Care Home FL2 Form was completed by provider. Spoke with SW Interior and spatial designer at [facility] about housing for client and [employer] Form completed. SW will f/u with client  on Monday about housing. Faris Coolman W RN CN

## 2022-08-25 ENCOUNTER — Encounter: Payer: Self-pay | Admitting: *Deleted

## 2022-08-25 NOTE — Congregational Nurse Program (Signed)
  Dept: [phone]   Congregational Nurse Program Note  Date of Encounter: 08/25/2022  Past Medical History: Past Medical History:  Diagnosis Date   Anxiety    Arthritis    back, joints   Asthma    Chronic pain    Constipation    Depression    Dysrhythmia    Palpitations   GERD (gastroesophageal reflux disease)    High cholesterol    Hypertension    resolved whn she stopped drinking alcohol   Mitral prolapse    dx as a child   Osteopenia    Osteoporosis     Encounter Details:  CNP Questionnaire - 08/25/22 1255       Questionnaire   Ask client: Do you give verbal consent for me to treat you today? Yes    Student Assistance N/A    Location Patient Served  [facility]    Visit Setting with Client Organization;Phone/Text/Email    Patient Status [employer];Medicare    Insurance/Financial Assistance Referral N/A    Medication N/A    Medical Provider Yes    Screening Referrals Made N/A    Medical Referrals Made N/A    Medical Appointment Made Other    Recently w/o PCP, now 1st time PCP visit completed due to CNs referral or appointment made N/A    Food N/A    Transportation N/A    Housing/Utilities No permanent housing    Interpersonal Safety N/A    Interventions Advocate/Support;Navigate Healthcare System    Abnormal to Normal Screening Since Last CN Visit N/A    Screenings CN Performed N/A    Sent Client to Lab for: N/A    Did client attend any of the following based off CNs referral or appointments made? N/A    ED Visit Averted N/A    Life-Saving Intervention Made N/A            Client seen in nurse's office to f/u about housing. Message has been left with [facility] SW to f/u with client for pallet homes. Application has been sent. Client is interested in Assisted Living in [place] as she is somewhat familiar with the area. Contacted [facility] and nothing is available and do not accept client's insurance. Contacted [facility] and spoke with  Annabelle Harman in admissions. Faxed FL2 form per request. She will call back Wednesday to set up appt with client. Client is required to have a Covid vaccine and TB test prior to admission with the facility if accepted.  Amandalynn Pitz W RN CN

## 2022-08-26 ENCOUNTER — Other Ambulatory Visit (HOSPITAL_COMMUNITY): Payer: Self-pay | Admitting: Nurse Practitioner

## 2022-08-26 DIAGNOSIS — I739 Peripheral vascular disease, unspecified: Secondary | ICD-10-CM

## 2022-09-01 ENCOUNTER — Encounter: Payer: Self-pay | Admitting: *Deleted

## 2022-09-01 NOTE — Congregational Nurse Program (Signed)
  Dept: [phone]   Congregational Nurse Program Note  Date of Encounter: 09/01/2022  Past Medical History: Past Medical History:  Diagnosis Date   Anxiety    Arthritis    back, joints   Asthma    Chronic pain    Constipation    Depression    Dysrhythmia    Palpitations   GERD (gastroesophageal reflux disease)    High cholesterol    Hypertension    resolved whn she stopped drinking alcohol   Mitral prolapse    dx as a child   Osteopenia    Osteoporosis     Encounter Details:  CNP Questionnaire - 09/01/22 1331       Questionnaire   Ask client: Do you give verbal consent for me to treat you today? N/A    Student Assistance N/A    Location Patient Served  [facility]    Visit Setting with Client Phone/Text/Email    Patient Status Unknown   pallet homes   Insurance Medicaid;Medicare    Insurance/Financial Assistance Referral N/A    Medication N/A    Medical Provider Yes    Screening Referrals Made N/A    Medical Referrals Made N/A    Medical Appointment Made N/A    Recently w/o PCP, now 1st time PCP visit completed due to CNs referral or appointment made N/A    Food N/A    Transportation N/A    Housing/Utilities No permanent housing    Interpersonal Safety N/A    Interventions Advocate/Support    Abnormal to Normal Screening Since Last CN Visit N/A    Screenings CN Performed N/A    Sent Client to Lab for: N/A    Did client attend any of the following based off CNs referral or appointments made? N/A    ED Visit Averted N/A    Life-Saving Intervention Made N/A            Writer received calls from [facility] along with [facility] looking for client. Client does not have a phone listed to contact. Spoke with SW for pallet homes and gave message to have client call. Demontez Novack W RN CN

## 2022-10-04 ENCOUNTER — Emergency Department (HOSPITAL_COMMUNITY)
Admission: EM | Admit: 2022-10-04 | Discharge: 2022-10-04 | Disposition: A | Discharge status: Home / Self Care | Payer: 59 | Attending: Emergency Medicine | Admitting: Emergency Medicine

## 2022-10-04 ENCOUNTER — Encounter (HOSPITAL_COMMUNITY): Payer: Self-pay | Admitting: Emergency Medicine

## 2022-10-04 ENCOUNTER — Emergency Department (HOSPITAL_COMMUNITY): Payer: 59

## 2022-10-04 ENCOUNTER — Other Ambulatory Visit: Payer: Self-pay

## 2022-10-04 DIAGNOSIS — R0789 Other chest pain: Secondary | ICD-10-CM | POA: Diagnosis present

## 2022-10-04 DIAGNOSIS — R079 Chest pain, unspecified: Secondary | ICD-10-CM

## 2022-10-04 DIAGNOSIS — F17210 Nicotine dependence, cigarettes, uncomplicated: Secondary | ICD-10-CM | POA: Insufficient documentation

## 2022-10-04 LAB — BASIC METABOLIC PANEL
Anion gap: 13 (ref 5–15)
BUN: 34 mg/dL — ABNORMAL HIGH (ref 8–23)
CO2: 21 mmol/L — ABNORMAL LOW (ref 22–32)
Calcium: 8.9 mg/dL (ref 8.9–10.3)
Chloride: 106 mmol/L (ref 98–111)
Creatinine, Ser: 0.97 mg/dL (ref 0.44–1.00)
GFR, Estimated: >60 mL/min (ref >60)
Glucose, Bld: 105 mg/dL — ABNORMAL HIGH (ref 70–99)
Potassium: 4.2 mmol/L (ref 3.5–5.1)
Sodium: 140 mmol/L (ref 135–145)

## 2022-10-04 LAB — CBC
HCT: 37.9 % (ref 36.0–46.0)
Hemoglobin: 12.6 g/dL (ref 12.0–15.0)
MCH: 31.1 pg (ref 26.0–34.0)
MCHC: 33.2 g/dL (ref 30.0–36.0)
MCV: 93.6 fL (ref 80.0–100.0)
Platelets: 327 10*3/uL (ref 150–400)
RBC: 4.05 MIL/uL (ref 3.87–5.11)
RDW: 12.8 % (ref 11.5–15.5)
WBC: 5 10*3/uL (ref 4.0–10.5)
nRBC: 0 % (ref 0.0–0.2)

## 2022-10-04 LAB — TROPONIN I (HIGH SENSITIVITY)
Troponin I (High Sensitivity): 5 ng/L (ref <18)
Troponin I (High Sensitivity): 6 ng/L (ref <18)

## 2022-10-04 NOTE — Discharge Instructions (Addendum)
You came to the emergency department today with chest pain.  All of your labs, EKG and chest x-ray are nonconcerning.  Please return with any recurring or worsening symptoms.  Below are the social work resources.     [facility]) Monday - Friday 8am - 3pm          Sat & Sun 8am - 2pm [address] [place], [place] [address]   [phone]     www.interactiveresourcecenter.org IRC offers among other critical resources: showers, laundry, barbershop, phone bank, mailroom, computer lab, medical clinic, gardens and a bike maintenance area.   [place]  (Men & women) [address] ([place] (Men/women/families) [address] [phone]   [facility] (Families with children) [phone] [address] ([place]) [place] [phone]   [facility] (Children ages 36-17) [address] [phone]   [facility]   (Women & children) [address] [place] [phone]   [facility] (Women/substance abuse) [place].  [place] [phone]   [facility] (Men) [address]  [place] [phone]   [facility] (Men) [address] [phone]  [employer] (Women) [place]  [place] [phone]   [facility] (Single women & women with children) [address]  [place] [phone]  [facility] (Men/women/families) [address] [phone]    [facility]   (Domestic Violence shelter) [place] [phone]   [place] (Men & women) [address]  [place] ([place] (Men) [address]  [employer] [phone] [place] (Men) [address] [place] [phone]   [facility] (Single women & families) [address] [phone]  [facility] (Men/women & families) [place].  [place] [phone]    If you are at risk of losing your housing (throughout [facility]) call the [place] at [phone]. You may also contact 2-1-1, a FREE service of the [place] [address] that provides information about many resources including housing. Dial 211, or visit online at CustodianSupply.fi. [place], contact Margretta Ditty [phone] (men only)                          Natale Lay in [place]:  51 day homeless program for women and men;                             contact Rev. Chambers [place]:  men/women/children [place] in [place], [place] [phone]                       [facility] in [place], [place]   [facility]:  [place] for Abused Women, [phone]; (women and children)  [facility]:  [place], men/women/children; [place] in [place], [place]; substance abuse halfway house for men  [facility]; 4 bedroom house in [facility] for homeless women, contact [place]               [facility] in [place], [phone] (women and children)               [facility], for abused women and children, 24 hour crisis line, [phone], [place], halfway house for women, [place], [place]:  [facility], [phone]; open Mon-Thurs from ALP37 - March 15 when temp is below 32 degrees                              [facility]; [place], [phone]; cell [phone]; open 24/7  [facility]:  Outreach for [place] - [phone]  [place]:  transitional housing for women and children; Jeanie Cooks [phone]

## 2022-10-04 NOTE — ED Triage Notes (Signed)
Pt BIB [facility] from the [facility]" with reports of chest pain. Denies SHOB.

## 2022-10-04 NOTE — ED Provider Triage Note (Signed)
Emergency Medicine Provider Triage Evaluation Note  [name] , a 72 y.o. female  was evaluated in triage.  Pt complains of substernal chest pain without radiation.  Given aspirin by EMS.  Chest pain has mostly resolved at this time, no nausea or vomiting..  Review of Systems  Per HPI  Physical Exam  There were no vitals taken for this visit. Gen:   Awake, no distress   Resp:  Normal effort  MSK:   Moves extremities without difficulty  Other:  Lungs CTA. Regular rhythm   Medical Decision Making  Medically screening exam initiated at 2:09 PM.  Appropriate orders placed.  [name] was informed that the remainder of the evaluation will be completed by another provider, this initial triage assessment does not replace that evaluation, and the importance of remaining in the ED until their evaluation is complete.     Sherrill Raring, PA-C 10/04/22 1409

## 2022-10-04 NOTE — Care Management (Signed)
Homeless resources placed on AVS, Patient currently lives in the [facility], which is heated

## 2022-10-04 NOTE — ED Provider Notes (Signed)
[facility] Provider Note   CSN: [id number] Arrival date & time: 10/04/22  1312     History  Chief Complaint  Patient presents with   Chest Pain    [name] is a 72 y.o. female with a past medical Struve osteoporosis presenting today for chest pain.  She reports that around 4 this morning she had chest pressure that woke her up.  Nonradiating.  No shortness of breath or palpitations.  No history of ACS, has never seen a cardiologist.  Reports that she does regularly smoke cigarettes cigarettes.  Has not recently been sick.  She does say that she is living at the [facility] and she is scared because it is cold and she is old and alone.  Says that her chest pain has resolved after lasting a couple hours.    Chest Pain Associated symptoms: no abdominal pain, no dizziness, no fever and no shortness of breath        Home Medications Prior to Admission medications   Medication Sig Start Date End Date Taking? Authorizing Provider  albuterol (PROVENTIL HFA;VENTOLIN HFA) 108 (90 BASE) MCG/ACT inhaler Inhale 2 puffs into the lungs every 4 (four) hours as needed for wheezing or shortness of breath.    [provider]  buPROPion (WELLBUTRIN XL) 150 MG 24 hr tablet Take 150 mg by mouth every morning.    [provider]  diclofenac Sodium (VOLTAREN) 1 % GEL Apply 2 g topically 4 (four) times daily. 07/07/22   Tacy Learn, PA-C  ibuprofen (ADVIL) 200 MG tablet Take 600 mg by mouth every 6 (six) hours.    [provider]  Lidocaine (HM LIDOCAINE PATCH) 4 % PTCH Apply 1 patch topically every 12 (twelve) hours as needed. Patient not taking: Reported on 03/14/2022 12/19/21   Loni Beckwith, PA-C  LORazepam (ATIVAN) 1 MG tablet Take 1 tablet (1 mg total) by mouth 2 (two) times daily as needed for up to 3 days for anxiety ("Do not take with narcotic medications. Must choose between one or the other."). Patient taking  differently: Take 1 mg by mouth at bedtime. 07/19/21 03/31/22  Terrilee Croak, MD  Multiple Vitamin (MULTIVITAMIN WITH MINERALS) TABS tablet Take 1 tablet by mouth daily.    [provider]  traZODone (DESYREL) 100 MG tablet Take 100-200 mg by mouth at bedtime. 02/17/22   [provider]  venlafaxine XR (EFFEXOR-XR) 150 MG 24 hr capsule Take 150 mg by mouth at bedtime.    [provider]      Allergies    Penicillins and Vancomycin    Review of Systems   Review of Systems  Constitutional:  Negative for chills and fever.  Respiratory:  Negative for chest tightness, shortness of breath and wheezing.   Cardiovascular:  Positive for chest pain.  Gastrointestinal:  Negative for abdominal pain.  Neurological:  Negative for dizziness.    Physical Exam Updated Vital Signs BP (!) 158/76   Pulse (!) 31   Temp (!) 97.5 F (36.4 C) (Oral)   Resp 14   SpO2 100%  Physical Exam Vitals and nursing note reviewed.  Constitutional:      General: She is not in acute distress.    Appearance: Normal appearance. She is not ill-appearing.  HENT:     Head: Normocephalic and atraumatic.  Eyes:     General: No scleral icterus.    Conjunctiva/sclera: Conjunctivae normal.  Cardiovascular:     Rate  and Rhythm: Normal rate and regular rhythm.     Heart sounds: Normal heart sounds.  Pulmonary:     Effort: Pulmonary effort is normal. No respiratory distress.     Breath sounds: Normal breath sounds. No decreased breath sounds or wheezing.  Chest:     Chest wall: No tenderness.  Skin:    General: Skin is warm and dry.     Findings: No rash.  Neurological:     Mental Status: She is alert.  Psychiatric:        Mood and Affect: Mood normal.     ED Results / Procedures / Treatments   Labs (all labs ordered are listed, but only abnormal results are displayed) Labs Reviewed  BASIC METABOLIC PANEL - Abnormal; Notable for the following components:      Result Value   CO2 21 (*)     Glucose, Bld 105 (*)    BUN 34 (*)    All other components within normal limits  CBC  TROPONIN I (HIGH SENSITIVITY)  TROPONIN I (HIGH SENSITIVITY)    EKG EKG Interpretation  Date/Time:  Saturday October 04 2022 14:24:54 EST Ventricular Rate:  63 PR Interval:  116 QRS Duration: 62 QT Interval:  432 QTC Calculation: 442 R Axis:   51 Text Interpretation: Sinus rhythm with Premature atrial complexes with Abberant conduction Nonspecific ST and T wave abnormality Abnormal ECG When compared with ECG of 11-Aug-2022 10:05, PVC new since previous Confirmed by Richardean Canal [phone]) on 10/04/2022 3:44:07 PM  Radiology DG Chest 2 View  Result Date: 10/04/2022 CLINICAL DATA:  Chest pain EXAM: CHEST - 2 VIEW COMPARISON:  08/11/2022 FINDINGS: The lungs are clear without focal pneumonia, edema, pneumothorax or pleural effusion. Hyperexpansion suggests emphysema. The cardiopericardial silhouette is within normal limits for size. Small to moderate hiatal hernia. Bones are diffusely demineralized with multiple lower thoracic compression fractures. Patient is status post right shoulder replacement. IMPRESSION: 1. Emphysema without acute cardiopulmonary findings. 2. Small to moderate hiatal hernia. Electronically Signed   By: Kennith Center M.D.   On: 10/04/2022 14:45    Procedures Procedures   Medications Ordered in ED Medications - No data to display  ED Course/ Medical Decision Making/ A&P             HEART Score: 3                Medical Decision Making  72 year old female presenting with chest pain. The emergent differential diagnosis of chest pain includes: Acute coronary syndrome, pericarditis, aortic dissection, pulmonary embolism, tension pneumothorax, and esophageal rupture.   This is not an exhaustive differential.    Past Medical History / Co-morbidities / Social History: Hyperlipidemia noted in patient's chart, smokes cigarettes, living at the [facility] as a social determinant of  health   Additional history: Per chart review patient has not seen a cardiologist in the [facility] health system.  No echo or catheterization noted in chart review.   Physical Exam: Pertinent physical exam findings include RRR, lung sounds clear  Lab Tests: I ordered, and personally interpreted labs.  The pertinent results include: Troponin negative x 2   Imaging Studies: I ordered and independently visualized and interpreted patient's chest x-ray and agree that there are no acute findings   Cardiac Monitoring:  The patient was maintained on a cardiac monitor.  I viewed and interpreted the cardiac monitored which showed an underlying rhythm of: sinus   Medications: None needed, Asx   MDM/Disposition: This is a 72 year old  female who presented today with chest pain.  Woke her up out of her sleep.  Says that it was more like pressure.  Lasted a couple of hours but has resolved prior to my evaluation.  Did not radiate.  No history of ACS.  She tells me she does not have high cholesterol but it is listed in her chart.  This and her tobacco use would be her only risk factor for ACS at this time.  Troponin negative x 2.  EKG nonischemic.  No longer having active chest pain.  Heart score is 3.  Throughout the evaluation she became emotional several times talking about her housing insecurity.  She says that she is elderly and cold and would like help living somewhere other than the [facility].  I suspect this is likely a large motivating factor of her visit today.  Mildly elevated blood pressure however she denies any active chest pain, back pain, dizziness, blurred vision and I doubt any hypertensive emergency.  At this time her cardiac workup has been negative.  There have also been no other acute findings.  Patient is stable to be discharged home with PCP follow-up.  Social worker has also visited her and given resources.  Patient is agreeable to discharge.     I discussed this case with my attending  physician Dr. Darl Householder who cosigned this note including patient's presenting symptoms, physical exam, and planned diagnostics and interventions. Attending physician stated agreement with plan or made changes to plan which were implemented.     Final Clinical Impression(s) / ED Diagnoses Final diagnoses:  Nonspecific chest pain    Rx / DC Orders ED Discharge Orders     None      Results and diagnoses were explained to the patient. Return precautions discussed in full. Patient had no additional questions and expressed complete understanding.   This chart was dictated using voice recognition software.  Despite best efforts to proofread,  errors can occur which can change the documentation meaning.    Darliss Ridgel 10/04/22 1752    Wyvonnia Dusky, MD 10/05/22 [phone]

## 2022-11-19 ENCOUNTER — Emergency Department (HOSPITAL_COMMUNITY): Payer: 59

## 2022-11-19 ENCOUNTER — Other Ambulatory Visit: Payer: Self-pay

## 2022-11-19 ENCOUNTER — Emergency Department (HOSPITAL_COMMUNITY)
Admission: EM | Admit: 2022-11-19 | Discharge: 2022-11-19 | Disposition: A | Discharge status: Home / Self Care | Payer: 59 | Attending: Emergency Medicine | Admitting: Emergency Medicine

## 2022-11-19 ENCOUNTER — Encounter (HOSPITAL_COMMUNITY): Payer: Self-pay | Admitting: Emergency Medicine

## 2022-11-19 DIAGNOSIS — R109 Unspecified abdominal pain: Secondary | ICD-10-CM | POA: Insufficient documentation

## 2022-11-19 DIAGNOSIS — Z79899 Other long term (current) drug therapy: Secondary | ICD-10-CM | POA: Insufficient documentation

## 2022-11-19 DIAGNOSIS — I10 Essential (primary) hypertension: Secondary | ICD-10-CM | POA: Insufficient documentation

## 2022-11-19 DIAGNOSIS — M545 Low back pain, unspecified: Secondary | ICD-10-CM | POA: Diagnosis present

## 2022-11-19 LAB — SEDIMENTATION RATE: Sed Rate: 19 mm/hr (ref 0–22)

## 2022-11-19 LAB — URINALYSIS, W/ REFLEX TO CULTURE (INFECTION SUSPECTED)
Bacteria, UA: NONE SEEN
Bilirubin Urine: NEGATIVE
Glucose, UA: NEGATIVE mg/dL
Hgb urine dipstick: NEGATIVE
Ketones, ur: NEGATIVE mg/dL
Leukocytes,Ua: NEGATIVE
Nitrite: NEGATIVE
Protein, ur: NEGATIVE mg/dL
Specific Gravity, Urine: 1.019 (ref 1.005–1.030)
pH: 6 (ref 5.0–8.0)

## 2022-11-19 LAB — CBC WITH DIFFERENTIAL/PLATELET
Abs Immature Granulocytes: 0.03 10*3/uL (ref 0.00–0.07)
Basophils Absolute: 0.1 10*3/uL (ref 0.0–0.1)
Basophils Relative: 2 %
Eosinophils Absolute: 0.2 10*3/uL (ref 0.0–0.5)
Eosinophils Relative: 3 %
HCT: 38.4 % (ref 36.0–46.0)
Hemoglobin: 12.6 g/dL (ref 12.0–15.0)
Immature Granulocytes: 1 %
Lymphocytes Relative: 36 %
Lymphs Abs: 2.4 10*3/uL (ref 0.7–4.0)
MCH: 31 pg (ref 26.0–34.0)
MCHC: 32.8 g/dL (ref 30.0–36.0)
MCV: 94.6 fL (ref 80.0–100.0)
Monocytes Absolute: 0.6 10*3/uL (ref 0.1–1.0)
Monocytes Relative: 9 %
Neutro Abs: 3.3 10*3/uL (ref 1.7–7.7)
Neutrophils Relative %: 49 %
Platelets: 360 10*3/uL (ref 150–400)
RBC: 4.06 MIL/uL (ref 3.87–5.11)
RDW: 14.6 % (ref 11.5–15.5)
WBC: 6.6 10*3/uL (ref 4.0–10.5)
nRBC: 0.3 % — ABNORMAL HIGH (ref 0.0–0.2)

## 2022-11-19 LAB — BASIC METABOLIC PANEL
Anion gap: 10 (ref 5–15)
BUN: 34 mg/dL — ABNORMAL HIGH (ref 8–23)
CO2: 23 mmol/L (ref 22–32)
Calcium: 9.2 mg/dL (ref 8.9–10.3)
Chloride: 106 mmol/L (ref 98–111)
Creatinine, Ser: 0.85 mg/dL (ref 0.44–1.00)
GFR, Estimated: >60 mL/min (ref >60)
Glucose, Bld: 98 mg/dL (ref 70–99)
Potassium: 4.3 mmol/L (ref 3.5–5.1)
Sodium: 139 mmol/L (ref 135–145)

## 2022-11-19 MED ORDER — CYCLOBENZAPRINE HCL 10 MG PO TABS
5.0000 mg | ORAL_TABLET | Freq: Once | ORAL | Status: AC
  Administered 2022-11-19: 5 mg via ORAL
  Filled 2022-11-19: qty 1

## 2022-11-19 MED ORDER — HYDROCODONE-ACETAMINOPHEN 5-325 MG PO TABS
1.0000 | ORAL_TABLET | Freq: Once | ORAL | Status: AC
  Administered 2022-11-19: 1 via ORAL
  Filled 2022-11-19: qty 1

## 2022-11-19 MED ORDER — MORPHINE SULFATE (PF) 4 MG/ML IV SOLN
4.0000 mg | Freq: Once | INTRAVENOUS | Status: AC
  Administered 2022-11-19: 4 mg via INTRAVENOUS
  Filled 2022-11-19: qty 1

## 2022-11-19 MED ORDER — HYDROCODONE-ACETAMINOPHEN 5-325 MG PO TABS: 1.0000 | ORAL_TABLET | Freq: Four times a day (QID) | ORAL | 0 refills | Status: DC | PRN

## 2022-11-19 MED ORDER — ETODOLAC 300 MG PO CAPS: 300.0000 mg | ORAL_CAPSULE | Freq: Three times a day (TID) | ORAL | 0 refills | Status: DC

## 2022-11-19 MED ORDER — LIDOCAINE 5 % EX PTCH: 1.0000 | MEDICATED_PATCH | CUTANEOUS | 0 refills | Status: DC

## 2022-11-19 MED ORDER — KETOROLAC TROMETHAMINE 60 MG/2ML IM SOLN
30.0000 mg | Freq: Once | INTRAMUSCULAR | Status: AC
  Administered 2022-11-19: 30 mg via INTRAMUSCULAR
  Filled 2022-11-19: qty 2

## 2022-11-19 MED ORDER — LIDOCAINE 5 % EX PTCH
1.0000 | MEDICATED_PATCH | CUTANEOUS | Status: DC
  Administered 2022-11-19: 1 via TRANSDERMAL
  Filled 2022-11-19: qty 1

## 2022-11-19 NOTE — ED Triage Notes (Signed)
Pt BIB [facility] from home due to lower left back pain.  Pt was getting back in bed from using bathroom and reported painful back pain.  Pt reports no trauma or falls.  Hx osteoporosis.  VS BP 132/80, pulse 62, SpO2 93%, Resp 16

## 2022-11-19 NOTE — ED Provider Notes (Signed)
Clinical Course as of 11/19/22 1814  Wed Nov 19, 2022  1811 Sedimentation rate Sed rate not elevated [JK]    Clinical Course User Index [JK] Dorie Rank, MD   Patient seen by Dr. Armandina Gemma.  Please see his note.  Plan was to follow-up on laboratory test.  Patient CBC is normal.  Her urinalysis is normal.  The metabolic panel is normal.  C-reactive protein is pending but that does not usually come back rapidly.  Her sed rate is normal at 19.  I doubt acute infectious etiology.  Patient was given medications for pain with improvement.  Discussed pain management at home.  Patient is comfortable with this plan.  Evaluation and diagnostic testing in the emergency department does not suggest an emergent condition requiring admission or immediate intervention beyond what has been performed at this time.  The patient is safe for discharge and has been instructed to return immediately for worsening symptoms, change in symptoms or any other concerns.    Dorie Rank, MD 11/19/22 [phone]

## 2022-11-19 NOTE — Discharge Instructions (Addendum)
CT imaging and urinalysis were reassuring.  Your symptoms are consistent with likely musculoskeletal low back pain.  Take the medications as prescribed to help with your pain.  Follow up with your doctor to be rechecked.

## 2022-11-19 NOTE — ED Provider Notes (Addendum)
[facility] Provider Note   CSN: [id number] Arrival date & time: 11/19/22  1059     History  Chief Complaint  Patient presents with   Back Pain    [name] is a 72 y.o. female.   Back Pain    72 year old female with medical history significant for anxiety, depression, chronic neck pain,, HLD, HTN, osteoporosis who presents emergency department with low back pain.  The patient states that she was having acute onset of low back pain over the past few days.  She noticed it while ambulating from the bathroom back to bed.  She denies any falls or trauma.  She described it as a pain not in the midline of her back but in the left side and the left flank.  It is worse with muscle movement and twisting.  She denies any hematuria, increased urinary frequency, fevers or chills.  She denies any abdominal pain, nausea or vomiting.  She denies any numbness or weakness in her groin.  She denies any numbness or weakness in her bilateral lower extremities.  No urinary incontinence or fecal incontinence.  Home Medications Prior to Admission medications   Medication Sig Start Date End Date Taking? Authorizing Provider  albuterol (PROVENTIL HFA;VENTOLIN HFA) 108 (90 BASE) MCG/ACT inhaler Inhale 2 puffs into the lungs every 4 (four) hours as needed for wheezing or shortness of breath.    [provider]  buPROPion (WELLBUTRIN XL) 150 MG 24 hr tablet Take 150 mg by mouth every morning.    [provider]  diclofenac Sodium (VOLTAREN) 1 % GEL Apply 2 g topically 4 (four) times daily. 07/07/22   Tacy Learn, PA-C  ibuprofen (ADVIL) 200 MG tablet Take 600 mg by mouth every 6 (six) hours.    [provider]  Lidocaine (HM LIDOCAINE PATCH) 4 % PTCH Apply 1 patch topically every 12 (twelve) hours as needed. Patient not taking: Reported on 03/14/2022 12/19/21   Loni Beckwith, PA-C  LORazepam (ATIVAN) 1 MG tablet Take 1 tablet (1  mg total) by mouth 2 (two) times daily as needed for up to 3 days for anxiety ("Do not take with narcotic medications. Must choose between one or the other."). Patient taking differently: Take 1 mg by mouth at bedtime. 07/19/21 03/31/22  Terrilee Croak, MD  Multiple Vitamin (MULTIVITAMIN WITH MINERALS) TABS tablet Take 1 tablet by mouth daily.    [provider]  traZODone (DESYREL) 100 MG tablet Take 100-200 mg by mouth at bedtime. 02/17/22   [provider]  venlafaxine XR (EFFEXOR-XR) 150 MG 24 hr capsule Take 150 mg by mouth at bedtime.    [provider]      Allergies    Penicillins and Vancomycin    Review of Systems   Review of Systems  Musculoskeletal:  Positive for back pain.  All other systems reviewed and are negative.   Physical Exam Updated Vital Signs BP 126/78 (BP Location: Right Arm)   Pulse 63   Temp 98.2 F (36.8 C) (Oral)   Resp 17   Ht '5\' 8"'$  (1.727 m)   Wt 54.4 kg   SpO2 100%   BMI 18.25 kg/m  Physical Exam Vitals and nursing note reviewed.  Constitutional:      General: She is not in acute distress.    Appearance: She is well-developed.  HENT:     Head: Normocephalic and atraumatic.  Eyes:     Conjunctiva/sclera: Conjunctivae normal.  Cardiovascular:     Rate and Rhythm: Normal rate and regular rhythm.  Pulmonary:     Effort: Pulmonary effort is normal. No respiratory distress.     Breath sounds: Normal breath sounds.  Abdominal:     Palpations: Abdomen is soft.     Tenderness: There is no abdominal tenderness.  Musculoskeletal:        General: No swelling.     Cervical back: Neck supple.  Skin:    General: Skin is warm and dry.     Capillary Refill: Capillary refill takes less than 2 seconds.  Neurological:     Mental Status: She is alert.     Comments: 5 of 5 strength in all 4 extremities with intact sensation to light touch  Psychiatric:        Mood and Affect: Mood normal.     ED Results / Procedures /  Treatments   Labs (all labs ordered are listed, but only abnormal results are displayed) Labs Reviewed  URINALYSIS, W/ REFLEX TO CULTURE (INFECTION SUSPECTED)    EKG None  Radiology CT Renal Stone Study  Result Date: 11/19/2022 CLINICAL DATA:  Left flank and lower back pain.  No known injury. EXAM: CT ABDOMEN AND PELVIS WITHOUT CONTRAST TECHNIQUE: Multidetector CT imaging of the abdomen and pelvis was performed following the standard protocol without IV contrast. RADIATION DOSE REDUCTION: This exam was performed according to the departmental dose-optimization program which includes automated exposure control, adjustment of the mA and/or kV according to patient size and/or use of iterative reconstruction technique. COMPARISON:  07/09/2021 FINDINGS: Lower chest: The inferior portion of previously demonstrated dense right breast implant capsule calcification is currently demonstrated. Normal sized heart. Linear atelectasis/scarring at both lung bases, right greater than left. Hepatobiliary: No focal liver abnormality is seen. No gallstones, gallbladder wall thickening, or biliary dilatation. Pancreas: Unremarkable. No pancreatic ductal dilatation or surrounding inflammatory changes. Spleen: Normal in size without focal abnormality. Adrenals/Urinary Tract: Adrenal glands are unremarkable. Kidneys are normal, without renal calculi, focal lesion, or hydronephrosis. Bladder is unremarkable. Stomach/Bowel: Moderately large hiatal hernia. Unremarkable small bowel and colon. The appendix is not visualized. Vascular/Lymphatic: Atheromatous arterial calcifications without aneurysm. No enlarged lymph nodes. Reproductive: Status post hysterectomy. No adnexal masses. Other: No abdominal wall hernia or abnormality. No abdominopelvic ascites. Musculoskeletal: Moderate to marked thoracolumbar scoliosis and degenerative changes. Stable old T11 and T12 vertebral compression deformities. No acute fractures or subluxations  are seen. Right hip prosthesis and left hip fixation hardware. IMPRESSION: 1. No acute abnormality. 2. Moderately large hiatal hernia. Electronically Signed   By: Claudie Revering M.D.   On: 11/19/2022 13:11    Procedures Procedures    Medications Ordered in ED Medications  lidocaine (LIDODERM) 5 % 1 patch (1 patch Transdermal Patch Applied 11/19/22 1119)  cyclobenzaprine (FLEXERIL) tablet 5 mg (5 mg Oral Given 11/19/22 1120)  ketorolac (TORADOL) injection 30 mg (30 mg Intramuscular Given 11/19/22 1118)    ED Course/ Medical Decision Making/ A&P                             Medical Decision Making Amount and/or Complexity of Data Reviewed Labs: ordered. Radiology: ordered.  Risk Prescription drug management.     72 year old female with medical history significant for anxiety, depression, chronic neck pain,, HLD, HTN, osteoporosis who presents emergency department with low back pain.  The patient states that she was having acute onset of low back pain over the past  few days.  She noticed it while ambulating from the bathroom back to bed.  She denies any falls or trauma.  She described it as a pain not in the midline of her back but in the left side and the left flank.  It is worse with muscle movement and twisting.  She denies any hematuria, increased urinary frequency, fevers or chills.  She denies any abdominal pain, nausea or vomiting.  She denies any numbness or weakness in her groin.  She denies any numbness or weakness in her bilateral lower extremities.  No urinary incontinence or fecal incontinence.   The patient is able to ambulate and is hemodynamically stable. There are no red flag symptoms. Specifically, she denies: -Being on an anticoagulant or blood thinner -Using IV drugs -Having a history of AAA -Having saddle anesthesia -Having urinary or fecal incontinence -Having any recent falls or trauma  Differential Diagnoses: I do not think that Jerl Santos is experiencing cauda  equina syndrome, abdominal aortic aneurysm, epidural abscess, aortic dissection, spinal hematoma, nephrolithiasis, spinal metastasis, discitis, or an acute fracture.   While in the ED, I provided the patient with: Medications  lidocaine (LIDODERM) 5 % 1 patch (1 patch Transdermal Patch Applied 11/19/22 1119)  cyclobenzaprine (FLEXERIL) tablet 5 mg (5 mg Oral Given 11/19/22 1120)  ketorolac (TORADOL) injection 30 mg (30 mg Intramuscular Given 11/19/22 1118)   CT Abdomen Pelvis WO: Musculoskeletal: Moderate to marked thoracolumbar scoliosis and  degenerative changes. Stable old T11 and T12 vertebral compression  deformities. No acute fractures or subluxations are seen. Right hip  prosthesis and left hip fixation hardware.    IMPRESSION:  1. No acute abnormality.  2. Moderately large hiatal hernia.     She was provided with lidocaine patch, Flexeril and Toradol for pain control.  CT imaging was performed to evaluate for nephrolithiasis which resulted negative.  Urinalysis was without evidence of UTI, hematuria.  On reassessment, the patient reported improvement. This presentation is most consistent with Musculoskeletal back pain.  On attempted ambulation, the patient endorsed severe pain in her left flank.  This pain is reproducible over the left flank muscles.  She has no midline tenderness of her spine.  She has good range of motion of the hip and no tenderness of the hips bilaterally.  No pelvic tenderness bilaterally.  No recent falls or trauma.  Lower concern for acute fracture at this time.  Given the patient's acute pain, will obtain screening labs and attempt further pain control. Signout given to Dr. Tomi Bamberger at [phone].  Final Clinical Impression(s) / ED Diagnoses Final diagnoses:  Acute left-sided low back pain without sciatica    Rx / DC Orders ED Discharge Orders     None           Regan Lemming, MD 11/19/22 1458    Regan Lemming, MD 11/19/22 1501

## 2022-11-19 NOTE — ED Notes (Signed)
Patient transported to CT 

## 2023-02-27 ENCOUNTER — Other Ambulatory Visit: Payer: Self-pay

## 2023-02-27 ENCOUNTER — Encounter (HOSPITAL_COMMUNITY): Payer: Self-pay

## 2023-02-27 ENCOUNTER — Emergency Department (HOSPITAL_COMMUNITY): Payer: 59

## 2023-02-27 ENCOUNTER — Emergency Department (HOSPITAL_COMMUNITY)
Admission: EM | Admit: 2023-02-27 | Discharge: 2023-02-27 | Disposition: A | Discharge status: Home / Self Care | Payer: 59 | Attending: Emergency Medicine | Admitting: Emergency Medicine

## 2023-02-27 DIAGNOSIS — M25552 Pain in left hip: Secondary | ICD-10-CM | POA: Diagnosis present

## 2023-02-27 DIAGNOSIS — S7002XA Contusion of left hip, initial encounter: Secondary | ICD-10-CM | POA: Diagnosis not present

## 2023-02-27 DIAGNOSIS — J45909 Unspecified asthma, uncomplicated: Secondary | ICD-10-CM | POA: Diagnosis not present

## 2023-02-27 DIAGNOSIS — W1830XA Fall on same level, unspecified, initial encounter: Secondary | ICD-10-CM | POA: Diagnosis not present

## 2023-02-27 DIAGNOSIS — I10 Essential (primary) hypertension: Secondary | ICD-10-CM | POA: Diagnosis not present

## 2023-02-27 MED ORDER — MELOXICAM 7.5 MG PO TABS: 7.5000 mg | ORAL_TABLET | Freq: Every day | ORAL | 0 refills | Status: DC | PRN

## 2023-02-27 NOTE — ED Provider Notes (Signed)
[place] EMERGENCY DEPARTMENT AT [facility] Provider Note   CSN: [id number] Arrival date & time: 02/27/23  1020     History  Chief Complaint  Patient presents with   Leg Pain    [name] is a 72 y.o. female.   Leg Pain Patient presents with left hip pain.  States that Pain on her left hip after falling on it.  Has had surgery on both hips.  States she is at the [facility] and has to lay on the hard floor and that is when it hurt.   Past Medical History:  Diagnosis Date   Anxiety    Arthritis    back, joints   Asthma    Chronic pain    Constipation    Depression    Dysrhythmia    Palpitations   GERD (gastroesophageal reflux disease)    High cholesterol    Hypertension    resolved whn she stopped drinking alcohol   Mitral prolapse    dx as a child   Osteopenia    Osteoporosis     Home Medications Prior to Admission medications   Medication Sig Start Date End Date Taking? Authorizing Provider  meloxicam (MOBIC) 7.5 MG tablet Take 1 tablet (7.5 mg total) by mouth daily as needed for pain. 02/27/23  Yes Benjiman Core, MD  albuterol (PROVENTIL HFA;VENTOLIN HFA) 108 (90 BASE) MCG/ACT inhaler Inhale 2 puffs into the lungs every 4 (four) hours as needed for wheezing or shortness of breath.    [provider]  buPROPion (WELLBUTRIN XL) 150 MG 24 hr tablet Take 150 mg by mouth every morning.    [provider]  diclofenac Sodium (VOLTAREN) 1 % GEL Apply 2 g topically 4 (four) times daily. 07/07/22   Jeannie Fend, PA-C  ibuprofen (ADVIL) 200 MG tablet Take 600 mg by mouth every 6 (six) hours.    [provider]  lidocaine (LIDODERM) 5 % Place 1 patch onto the skin daily. Remove & Discard patch within 12 hours or as directed by MD 11/19/22   Linwood Dibbles, MD  LORazepam (ATIVAN) 1 MG tablet Take 1 tablet (1 mg total) by mouth 2 (two) times daily as needed for up to 3 days for anxiety ("Do not take with narcotic medications. Must choose  between one or the other."). Patient taking differently: Take 1 mg by mouth at bedtime. 07/19/21 03/31/22  Lorin Glass, MD  Multiple Vitamin (MULTIVITAMIN WITH MINERALS) TABS tablet Take 1 tablet by mouth daily.    [provider]  traZODone (DESYREL) 100 MG tablet Take 100-200 mg by mouth at bedtime. 02/17/22   [provider]  venlafaxine XR (EFFEXOR-XR) 150 MG 24 hr capsule Take 150 mg by mouth at bedtime.    [provider]      Allergies    Penicillins and Vancomycin    Review of Systems   Review of Systems  Physical Exam Updated Vital Signs BP 121/74 (BP Location: Right Arm)   Pulse 64   Temp 98.6 F (37 C) (Oral)   Resp 16   SpO2 99%  Physical Exam Vitals and nursing note reviewed.  HENT:     Head: Atraumatic.  Cardiovascular:     Rate and Rhythm: Regular rhythm.  Abdominal:     Tenderness: There is no abdominal tenderness.  Musculoskeletal:        General: Tenderness present.     Comments: Tenderness to left hip laterally.  Somewhat decreased range of motion.  No deformity.  Neurological:     Mental Status: She is alert and oriented to person, place, and time.     ED Results / Procedures / Treatments   Labs (all labs ordered are listed, but only abnormal results are displayed) Labs Reviewed - No data to display  EKG None  Radiology DG Hip Unilat W or Wo Pelvis 2-3 Views Left  Result Date: 02/27/2023 CLINICAL DATA:  Pain after fall EXAM: DG HIP (WITH OR WITHOUT PELVIS) 3V LEFT COMPARISON:  X-ray 07/07/2022 FINDINGS: Stable extensive bilateral hardware. There are pins along the left femoral neck transfixing the subcapital old femoral neck injury. Right hip arthroplasty identified with screw fixated acetabular cup. Cerclage wires and lateral fixation plate seen. The right-sided hardware are incompletely included in the imaging field of the left hip focused exam. Osteopenia. Mild degenerative changes of the sacroiliac joints. No additional  fracture or dislocation. Presumed multiple injection granulomas. Curvature and degenerative changes of the spine at the edge of the imaging field. IMPRESSION: Osteopenia. Stable bilateral hip hardware. Mild degenerative changes. Electronically Signed   By: Karen Kays M.D.   On: 02/27/2023 12:27    Procedures Procedures    Medications Ordered in ED Medications - No data to display  ED Course/ Medical Decision Making/ A&P                             Medical Decision Making Amount and/or Complexity of Data Reviewed Radiology: ordered.  Risk Prescription drug management.   Patient with fall.  Left hip pain.  X-ray independently interpreted reassuring.  No fracture seen.  No other apparent injury.  Pelvis appears stable.  Appears stable for discharge home   Will add Mobic to pain treatment.  Stop her etodolac.       Final Clinical Impression(s) / ED Diagnoses Final diagnoses:  Contusion of left hip, initial encounter    Rx / DC Orders ED Discharge Orders          Ordered    meloxicam (MOBIC) 7.5 MG tablet  Daily PRN        02/27/23 1249              Benjiman Core, MD 02/27/23 1250

## 2023-02-27 NOTE — ED Triage Notes (Signed)
Patient was brought in via EMS from the [facility] today. Patient is complaining of right hip pain that radiates down the right leg. Patient states that she has fallen several times over the past three days. Patient has hx of HTN, no blood thinners noted. Patient is alert and aware x4. No medications given in route per EMS.

## 2023-04-04 ENCOUNTER — Emergency Department (HOSPITAL_COMMUNITY): Payer: 59

## 2023-04-04 ENCOUNTER — Emergency Department (HOSPITAL_COMMUNITY)
Admission: EM | Admit: 2023-04-04 | Discharge: 2023-04-04 | Disposition: A | Discharge status: Home / Self Care | Payer: 59 | Attending: Emergency Medicine | Admitting: Emergency Medicine

## 2023-04-04 ENCOUNTER — Encounter (HOSPITAL_COMMUNITY): Payer: Self-pay

## 2023-04-04 ENCOUNTER — Other Ambulatory Visit: Payer: Self-pay

## 2023-04-04 DIAGNOSIS — M79652 Pain in left thigh: Secondary | ICD-10-CM | POA: Diagnosis not present

## 2023-04-04 DIAGNOSIS — W03XXXA Other fall on same level due to collision with another person, initial encounter: Secondary | ICD-10-CM | POA: Insufficient documentation

## 2023-04-04 DIAGNOSIS — J45909 Unspecified asthma, uncomplicated: Secondary | ICD-10-CM | POA: Diagnosis not present

## 2023-04-04 DIAGNOSIS — M25562 Pain in left knee: Secondary | ICD-10-CM | POA: Insufficient documentation

## 2023-04-04 DIAGNOSIS — I10 Essential (primary) hypertension: Secondary | ICD-10-CM | POA: Insufficient documentation

## 2023-04-04 DIAGNOSIS — Z79899 Other long term (current) drug therapy: Secondary | ICD-10-CM | POA: Diagnosis not present

## 2023-04-04 DIAGNOSIS — S7002XA Contusion of left hip, initial encounter: Secondary | ICD-10-CM | POA: Insufficient documentation

## 2023-04-04 DIAGNOSIS — S79912A Unspecified injury of left hip, initial encounter: Secondary | ICD-10-CM | POA: Diagnosis present

## 2023-04-04 MED ORDER — LIDOCAINE 5 % EX PTCH: 1.0000 | MEDICATED_PATCH | CUTANEOUS | 0 refills | Status: DC

## 2023-04-04 MED ORDER — ACETAMINOPHEN 500 MG PO TABS
1000.0000 mg | ORAL_TABLET | Freq: Once | ORAL | Status: AC
  Administered 2023-04-04: 1000 mg via ORAL
  Filled 2023-04-04: qty 2

## 2023-04-04 MED ORDER — KETOROLAC TROMETHAMINE 15 MG/ML IJ SOLN
15.0000 mg | Freq: Once | INTRAMUSCULAR | Status: AC
  Administered 2023-04-04: 15 mg via INTRAMUSCULAR
  Filled 2023-04-04: qty 1

## 2023-04-04 NOTE — ED Triage Notes (Addendum)
Pt bib ems from [facility]; pt endorsing multiple mechanical falls in the last week, from standing; uses walker; no loc, no complaints of hitting head; c/o L hip pain down to knee; no obvious deformities, no bruising; not on thinners; pt a and o x 4; 130/66, 97%, HR 70, RR 16; pt took 400 mg tylenol at 0700 with some relief

## 2023-04-04 NOTE — ED Provider Notes (Signed)
[place] EMERGENCY DEPARTMENT AT [facility] Provider Note   CSN: [id number] Arrival date & time: 04/04/23  1191     History  Chief Complaint  Patient presents with   Marletta Lor    [name] is a 72 y.o. female with PMH as listed below who presents with fall 3 days ago out of her wheelchair at the [facility].  She states she collided with somebody because it is difficult to move around and she fell onto the floor.  She did not hit her head or lose consciousness.  Denies any neck pain or chest pain.  She endorses left hip pain, left thigh and left knee pain.  She uses a wheelchair because she had surgery on her left knee.  She has not noted any deformities and she states she does not think it is broken.  No numbness tingling.  She does not take any blood thinners.  Has tried tylenol and Advil which has not helped.  She has been ambulatory since the fall with her walker.  Presented for similar symptoms on 02/27/2023. Otherwise she is in her NSOH.   Past Medical History:  Diagnosis Date   Anxiety    Arthritis    back, joints   Asthma    Chronic pain    Constipation    Depression    Dysrhythmia    Palpitations   GERD (gastroesophageal reflux disease)    High cholesterol    Hypertension    resolved whn she stopped drinking alcohol   Mitral prolapse    dx as a child   Osteopenia    Osteoporosis        Home Medications Prior to Admission medications   Medication Sig Start Date End Date Taking? Authorizing Provider  lidocaine (LIDODERM) 5 % Place 1 patch onto the skin daily. Remove & Discard patch within 12 hours or as directed by MD 04/04/23  Yes Loetta Rough, MD  albuterol (PROVENTIL HFA;VENTOLIN HFA) 108 (90 BASE) MCG/ACT inhaler Inhale 2 puffs into the lungs every 4 (four) hours as needed for wheezing or shortness of breath.    [provider]  buPROPion (WELLBUTRIN XL) 150 MG 24 hr tablet Take 150 mg by mouth every morning.    [provider]   diclofenac Sodium (VOLTAREN) 1 % GEL Apply 2 g topically 4 (four) times daily. 07/07/22   Jeannie Fend, PA-C  ibuprofen (ADVIL) 200 MG tablet Take 600 mg by mouth every 6 (six) hours.    [provider]  LORazepam (ATIVAN) 1 MG tablet Take 1 tablet (1 mg total) by mouth 2 (two) times daily as needed for up to 3 days for anxiety ("Do not take with narcotic medications. Must choose between one or the other."). Patient taking differently: Take 1 mg by mouth at bedtime. 07/19/21 03/31/22  Lorin Glass, MD  meloxicam (MOBIC) 7.5 MG tablet Take 1 tablet (7.5 mg total) by mouth daily as needed for pain. 02/27/23   Benjiman Core, MD  Multiple Vitamin (MULTIVITAMIN WITH MINERALS) TABS tablet Take 1 tablet by mouth daily.    [provider]  traZODone (DESYREL) 100 MG tablet Take 100-200 mg by mouth at bedtime. 02/17/22   [provider]  venlafaxine XR (EFFEXOR-XR) 150 MG 24 hr capsule Take 150 mg by mouth at bedtime.    [provider]      Allergies    Penicillins and Vancomycin    Review of Systems   Review of Systems A 10  point review of systems was performed and is negative unless otherwise reported in HPI.  Physical Exam Updated Vital Signs BP (!) 141/86   Pulse 61   Temp 97.7 F (36.5 C) (Oral)   Resp 18   SpO2 100%  Physical Exam General: Normal appearing female, lying in bed. HOH. HEENT: PERRLA, Sclera anicteric, MMM, trachea midline. NCAT. No midline C-spine TTP.  Cardiology: RRR, no murmurs/rubs/gallops. BL radial and DP pulses equal bilaterally.  Resp: Normal respiratory rate and effort. CTAB, no wheezes, rhonchi, crackles.  Abd: Soft, non-tender, non-distended. No rebound tenderness or guarding.  Pelvis: Pelvis stable/nontender. MSK: No peripheral edema or signs of trauma. Extremities without deformity. Mild TTP over L hip/femur without poin tenderness. Soft compartments, no overlying skin changes/e/o injury. FROM active/passive ROM of L  hip/knee. Intact pulses distally.  Skin: warm, dry.  Neuro: A&Ox4, CNs II-XII grossly intact. MAEs. Sensation grossly intact.  Psych: Normal mood and affect.   ED Results / Procedures / Treatments   Labs (all labs ordered are listed, but only abnormal results are displayed) Labs Reviewed - No data to display  EKG None  Radiology DG Pelvis 1-2 Views  Result Date: 04/04/2023 CLINICAL DATA:  Status post fall. EXAM: PELVIS - 1-2 VIEW; LEFT KNEE - 1-2 VIEW; LEFT FEMUR 2 VIEWS COMPARISON:  CT pelvis 11/19/2022.  Left hip radiographs 02/27/2023. FINDINGS: Status post right total hip arthroplasty with lateral plating and cerclage wiring of the proximal femur, incompletely visualized, although grossly stable. Unchanged position of the cannulated screws within the left femoral neck. Unchanged deformity of the left femoral neck from subcapital fracture appears unchanged. There is no evidence of acute fracture or dislocation. There are stable postsurgical changes in the left knee from previous total knee arthroplasty. No significant knee joint effusion. Probable chronic loose bodies posterior to the knee within a Baker cyst. Chronic calcifications within the anterolateral soft tissues of the mid left thigh. IMPRESSION: 1. No evidence of acute fracture or dislocation in the pelvis, left femur or left knee. 2. Stable postsurgical changes as described. Electronically Signed   By: Carey Bullocks M.D.   On: 04/04/2023 11:17   DG Femur Min 2 Views Left  Result Date: 04/04/2023 CLINICAL DATA:  Status post fall. EXAM: PELVIS - 1-2 VIEW; LEFT KNEE - 1-2 VIEW; LEFT FEMUR 2 VIEWS COMPARISON:  CT pelvis 11/19/2022.  Left hip radiographs 02/27/2023. FINDINGS: Status post right total hip arthroplasty with lateral plating and cerclage wiring of the proximal femur, incompletely visualized, although grossly stable. Unchanged position of the cannulated screws within the left femoral neck. Unchanged deformity of the left  femoral neck from subcapital fracture appears unchanged. There is no evidence of acute fracture or dislocation. There are stable postsurgical changes in the left knee from previous total knee arthroplasty. No significant knee joint effusion. Probable chronic loose bodies posterior to the knee within a Baker cyst. Chronic calcifications within the anterolateral soft tissues of the mid left thigh. IMPRESSION: 1. No evidence of acute fracture or dislocation in the pelvis, left femur or left knee. 2. Stable postsurgical changes as described. Electronically Signed   By: Carey Bullocks M.D.   On: 04/04/2023 11:17   DG Knee 2 Views Left  Result Date: 04/04/2023 CLINICAL DATA:  Status post fall. EXAM: PELVIS - 1-2 VIEW; LEFT KNEE - 1-2 VIEW; LEFT FEMUR 2 VIEWS COMPARISON:  CT pelvis 11/19/2022.  Left hip radiographs 02/27/2023. FINDINGS: Status post right total hip arthroplasty with lateral plating and cerclage wiring of the  proximal femur, incompletely visualized, although grossly stable. Unchanged position of the cannulated screws within the left femoral neck. Unchanged deformity of the left femoral neck from subcapital fracture appears unchanged. There is no evidence of acute fracture or dislocation. There are stable postsurgical changes in the left knee from previous total knee arthroplasty. No significant knee joint effusion. Probable chronic loose bodies posterior to the knee within a Baker cyst. Chronic calcifications within the anterolateral soft tissues of the mid left thigh. IMPRESSION: 1. No evidence of acute fracture or dislocation in the pelvis, left femur or left knee. 2. Stable postsurgical changes as described. Electronically Signed   By: Carey Bullocks M.D.   On: 04/04/2023 11:17    Procedures Procedures    Medications Ordered in ED Medications  acetaminophen (TYLENOL) tablet 1,000 mg (1,000 mg Oral Given 04/04/23 1023)  ketorolac (TORADOL) 15 MG/ML injection 15 mg (15 mg Intramuscular Given  04/04/23 1024)    ED Course/ Medical Decision Making/ A&P                          Medical Decision Making Amount and/or Complexity of Data Reviewed Radiology: ordered. Decision-making details documented in ED Course.  Risk OTC drugs. Prescription drug management.    This patient presents to the ED for concern of left hip/leg pain, this involves an extensive number of treatment options, and is a complaint that carries with it a high risk of complications and morbidity. HDS, very well-appearing.  MDM:    Pt with left hip/leg/knee pain after a fall several days ago. She has no evidence of gross deformity or compartment syndrome.  Her x-rays are negative for any dislocation or fracture.  She has been ambulatory on the leg with her walker and I am not concerned about an occult fracture.  She is intact neurovascularly.  There is no overlying skin changes to indicate cellulitis or soft tissue infection. No hematoma noted. Patient is overall very well-appearing and her symptoms are improved after Tylenol and Toradol.  Patient likely with contusion of left hip after fall.  Recommend follow-up with PCP and Tylenol/ibuprofen at home.  Can also use ice pack and lidocaine patches if those help.  Discharged with discharge instructions and return precautions, all questions answered to patient satisfaction.  Clinical Course as of 04/04/23 1247  Sat Apr 04, 2023  1139 DG Pelvis 1-2 Views 1. No evidence of acute fracture or dislocation in the pelvis, left femur or left knee. 2. Stable postsurgical changes as described.   [HN]    Clinical Course User Index [HN] Loetta Rough, MD     Imaging Studies ordered: I ordered imaging studies including LLE XRs I independently visualized and interpreted imaging. I agree with the radiologist interpretation  Additional history obtained from chart review.    Reevaluation: After the interventions noted above, I reevaluated the patient and found that they  have :improved  Social Determinants of Health: Lives at [facility], homeless  Disposition:  DC w/ discharge instructions/return precautions. All questions answered to patient's satisfaction.    Co morbidities that complicate the patient evaluation  Past Medical History:  Diagnosis Date   Anxiety    Arthritis    back, joints   Asthma    Chronic pain    Constipation    Depression    Dysrhythmia    Palpitations   GERD (gastroesophageal reflux disease)    High cholesterol    Hypertension    resolved whn she  stopped drinking alcohol   Mitral prolapse    dx as a child   Osteopenia    Osteoporosis      Medicines Meds ordered this encounter  Medications   acetaminophen (TYLENOL) tablet 1,000 mg   ketorolac (TORADOL) 15 MG/ML injection 15 mg   lidocaine (LIDODERM) 5 %    Sig: Place 1 patch onto the skin daily. Remove & Discard patch within 12 hours or as directed by MD    Dispense:  30 patch    Refill:  0    I have reviewed the patients home medicines and have made adjustments as needed  Problem List / ED Course: Problem List Items Addressed This Visit   None Visit Diagnoses     Contusion of left hip, initial encounter    -  Primary                   This note was created using dictation software, which may contain spelling or grammatical errors.    Loetta Rough, MD 04/04/23 1247

## 2023-04-04 NOTE — Discharge Instructions (Addendum)
Thank you for coming to [facility]. You were seen for fall, left hip/leg pain. We did an exam, and imaging, and these showed no acute findings.  Please follow up with your primary care provider within 1 week. You can alternate taking Tylenol and ibuprofen as needed for pain. You can take 650mg  tylenol (acetaminophen) every 4-6 hours, and 600 mg ibuprofen 3 times a day. You can also use lidocaine patches once per day on the area. Ice packs may help the pain as well.   Do not hesitate to return to the ED or call 911 if you experience: -Worsening symptoms -Severe pain inability to walk -Numbness/tingling -Lightheadedness, passing out -Fevers/chills -Anything else that concerns you

## 2023-04-04 NOTE — ED Notes (Signed)
Dr Naasz at bedside  

## 2023-04-04 NOTE — ED Notes (Signed)
Patient transported to X-ray 

## 2023-06-08 ENCOUNTER — Emergency Department (HOSPITAL_COMMUNITY)
Admission: EM | Admit: 2023-06-08 | Discharge: 2023-06-09 | Discharge status: Against Medical Advice | Payer: 59 | Attending: Emergency Medicine | Admitting: Emergency Medicine

## 2023-06-08 ENCOUNTER — Other Ambulatory Visit: Payer: Self-pay

## 2023-06-08 ENCOUNTER — Encounter (HOSPITAL_COMMUNITY): Payer: Self-pay

## 2023-06-08 DIAGNOSIS — S0012XA Contusion of left eyelid and periocular area, initial encounter: Secondary | ICD-10-CM | POA: Insufficient documentation

## 2023-06-08 DIAGNOSIS — S0990XA Unspecified injury of head, initial encounter: Secondary | ICD-10-CM | POA: Diagnosis present

## 2023-06-08 DIAGNOSIS — Z5321 Procedure and treatment not carried out due to patient leaving prior to being seen by health care provider: Secondary | ICD-10-CM | POA: Insufficient documentation

## 2023-06-08 DIAGNOSIS — W1830XA Fall on same level, unspecified, initial encounter: Secondary | ICD-10-CM | POA: Insufficient documentation

## 2023-06-08 NOTE — ED Triage Notes (Signed)
BIB EMS from [facility] for mechanical fall, Hit head on cement floor. No LOC, no dizziness. NO blood thinners. Hematoma to left eyebrow.

## 2023-06-26 ENCOUNTER — Emergency Department (HOSPITAL_COMMUNITY): Payer: 59

## 2023-06-26 ENCOUNTER — Encounter (HOSPITAL_COMMUNITY): Payer: Self-pay | Admitting: *Deleted

## 2023-06-26 ENCOUNTER — Other Ambulatory Visit: Payer: Self-pay

## 2023-06-26 ENCOUNTER — Emergency Department (HOSPITAL_COMMUNITY)
Admission: EM | Admit: 2023-06-26 | Discharge: 2023-06-26 | Disposition: A | Discharge status: Home / Self Care | Payer: 59 | Attending: Student | Admitting: Student

## 2023-06-26 DIAGNOSIS — W19XXXA Unspecified fall, initial encounter: Secondary | ICD-10-CM | POA: Diagnosis not present

## 2023-06-26 DIAGNOSIS — M25552 Pain in left hip: Secondary | ICD-10-CM | POA: Insufficient documentation

## 2023-06-26 DIAGNOSIS — R531 Weakness: Secondary | ICD-10-CM | POA: Insufficient documentation

## 2023-06-26 DIAGNOSIS — M25511 Pain in right shoulder: Secondary | ICD-10-CM | POA: Diagnosis not present

## 2023-06-26 MED ORDER — ACETAMINOPHEN 500 MG PO TABS
1000.0000 mg | ORAL_TABLET | Freq: Once | ORAL | Status: AC
  Administered 2023-06-26: 1000 mg via ORAL
  Filled 2023-06-26: qty 2

## 2023-06-26 NOTE — ED Provider Triage Note (Signed)
Emergency Medicine Provider Triage Evaluation Note  [name] , a 72 y.o. female  was evaluated in triage.  Pt complains of ongoing left hip and right shoulder pain after multiple falls yesterday.  She reports the falls were because her leg was just weak and Giving out.  She has pain when she ambulates.  She denies any syncope, dizziness, shortness of breath or chest pain.  Review of Systems  Positive: Left hip and right shoulder pain Negative: Syncope, chest pain shortness of breath  Physical Exam  There were no vitals taken for this visit. Gen:   Awake, no distress   Resp:  Normal effort  MSK:   Moves extremities without difficulty but has pain in her left mid femur and hip area as well as pain in the right shoulder and the [facility] joint with well-healed surgical scar. Other:  5 out of 5 strength in bilateral lower extremities, 1+ pitting edema bilaterally, pulses intact  Medical Decision Making  Medically screening exam initiated at 11:09 AM.  Appropriate orders placed.  TOLA MEAS was informed that the remainder of the evaluation will be completed by another provider, this initial triage assessment does not replace that evaluation, and the importance of remaining in the ED until their evaluation is complete.     Gwyneth Sprout, MD 06/26/23 [phone]

## 2023-06-26 NOTE — ED Triage Notes (Signed)
Patient c/o left hip pain states she fell yest several times.  Also c/o right shoulder pain. Patient is able to bear weight however states it hurts. Walked into ED from [place] truck.

## 2023-06-26 NOTE — Discharge Instructions (Signed)
As we talked about, no fracture was found on your xrays of your hip or your shoulder.   Please see your doctor if pain continues. Tylenol and/or ibuprofen for pain as needed.

## 2023-06-28 NOTE — ED Provider Notes (Signed)
[place] EMERGENCY DEPARTMENT AT [facility] Provider Note   CSN: [id number] Arrival date & time: 06/26/23  1056     History  Chief Complaint  Patient presents with   Hip Pain    [name] is a 72 y.o. female.  Patient to ED with complaint of left hip pain and right shoulder pain after multiple falls that occurred yesterday. She reports left leg weakness because of pain in the hip joint. No head injury. No dizziness, chest pain, SOB, recent fever.   The history is provided by the patient. No language interpreter was used.  Hip Pain       Home Medications Prior to Admission medications   Medication Sig Start Date End Date Taking? Authorizing Provider  albuterol (PROVENTIL HFA;VENTOLIN HFA) 108 (90 BASE) MCG/ACT inhaler Inhale 2 puffs into the lungs every 4 (four) hours as needed for wheezing or shortness of breath.    [provider]  buPROPion (WELLBUTRIN XL) 150 MG 24 hr tablet Take 150 mg by mouth every morning.    [provider]  diclofenac Sodium (VOLTAREN) 1 % GEL Apply 2 g topically 4 (four) times daily. 07/07/22   Jeannie Fend, PA-C  ibuprofen (ADVIL) 200 MG tablet Take 600 mg by mouth every 6 (six) hours.    [provider]  lidocaine (LIDODERM) 5 % Place 1 patch onto the skin daily. Remove & Discard patch within 12 hours or as directed by MD 04/04/23   Loetta Rough, MD  LORazepam (ATIVAN) 1 MG tablet Take 1 tablet (1 mg total) by mouth 2 (two) times daily as needed for up to 3 days for anxiety ("Do not take with narcotic medications. Must choose between one or the other."). Patient taking differently: Take 1 mg by mouth at bedtime. 07/19/21 03/31/22  Lorin Glass, MD  meloxicam (MOBIC) 7.5 MG tablet Take 1 tablet (7.5 mg total) by mouth daily as needed for pain. 02/27/23   Benjiman Core, MD  Multiple Vitamin (MULTIVITAMIN WITH MINERALS) TABS tablet Take 1 tablet by mouth daily.    [provider]  traZODone  (DESYREL) 100 MG tablet Take 100-200 mg by mouth at bedtime. 02/17/22   [provider]  venlafaxine XR (EFFEXOR-XR) 150 MG 24 hr capsule Take 150 mg by mouth at bedtime.    [provider]      Allergies    Penicillins and Vancomycin    Review of Systems   Review of Systems  Physical Exam Updated Vital Signs BP (!) 143/117   Pulse 60   Temp 98.2 F (36.8 C)   Resp 16   Ht 5\' 8"  (1.727 m)   Wt 54.4 kg   SpO2 100%   BMI 18.24 kg/m  Physical Exam Vitals and nursing note reviewed.  Cardiovascular:     Rate and Rhythm: Normal rate.  Pulmonary:     Effort: Pulmonary effort is normal.  Musculoskeletal:     Comments: No joint deformity. No strength loss of any extremity.   Skin:    General: Skin is warm and dry.     Findings: No erythema.  Neurological:     Mental Status: She is alert.     Sensory: No sensory deficit.     ED Results / Procedures / Treatments   Labs (all labs ordered are listed, but only abnormal results are displayed) Labs Reviewed - No data to display  EKG EKG Interpretation Date/Time:  Friday June 26 2023 10:44:42 EDT Ventricular Rate:  69 PR Interval:  122 QRS Duration:  66 QT Interval:  400 QTC Calculation: 428 R Axis:   49  Text Interpretation: Normal sinus rhythm Cannot rule out Anterior infarct , age undetermined No significant change since last tracing When compared with ECG of 04-Oct-2022 14:24, PREVIOUS ECG IS PRESENT Confirmed by Gwyneth Sprout ([id number]) on 06/26/2023 11:15:49 AM  Radiology No results found.  Procedures Procedures    Medications Ordered in ED Medications  acetaminophen (TYLENOL) tablet 1,000 mg (1,000 mg Oral Given 06/26/23 1117)    ED Course/ Medical Decision Making/ A&P Clinical Course as of 06/28/23 1420  Sun Jun 28, 2023  1420 Patient seen by dr. Anitra Lauth during MSE process. Xrays reviewed and are negative for any acute finding. She is felt stable for discharge home.  [SU]     Clinical Course User Index [SU] Elpidio Anis, PA-C                                 Medical Decision Making          Final Clinical Impression(s) / ED Diagnoses Final diagnoses:  Left hip pain    Rx / DC Orders ED Discharge Orders     None         Elpidio Anis, PA-C 06/28/23 1421    Kommor, Wyn Forster, MD 06/29/23 1145

## 2023-07-26 ENCOUNTER — Other Ambulatory Visit: Payer: Self-pay

## 2023-07-26 ENCOUNTER — Emergency Department (HOSPITAL_COMMUNITY): Payer: 59

## 2023-07-26 ENCOUNTER — Emergency Department (HOSPITAL_COMMUNITY)
Admission: EM | Admit: 2023-07-26 | Discharge: 2023-07-26 | Disposition: A | Discharge status: Home / Self Care | Payer: 59 | Attending: Emergency Medicine | Admitting: Emergency Medicine

## 2023-07-26 ENCOUNTER — Encounter (HOSPITAL_COMMUNITY): Payer: Self-pay

## 2023-07-26 DIAGNOSIS — J45909 Unspecified asthma, uncomplicated: Secondary | ICD-10-CM | POA: Insufficient documentation

## 2023-07-26 DIAGNOSIS — R0781 Pleurodynia: Secondary | ICD-10-CM | POA: Diagnosis present

## 2023-07-26 DIAGNOSIS — I10 Essential (primary) hypertension: Secondary | ICD-10-CM | POA: Insufficient documentation

## 2023-07-26 DIAGNOSIS — W1830XA Fall on same level, unspecified, initial encounter: Secondary | ICD-10-CM | POA: Diagnosis not present

## 2023-07-26 DIAGNOSIS — S2241XA Multiple fractures of ribs, right side, initial encounter for closed fracture: Secondary | ICD-10-CM | POA: Insufficient documentation

## 2023-07-26 MED ORDER — METHOCARBAMOL 500 MG PO TABS
500.0000 mg | ORAL_TABLET | Freq: Once | ORAL | Status: AC
  Administered 2023-07-26: 500 mg via ORAL
  Filled 2023-07-26: qty 1

## 2023-07-26 MED ORDER — LIDOCAINE 5 % EX PTCH: 1.0000 | MEDICATED_PATCH | CUTANEOUS | 0 refills | Status: DC

## 2023-07-26 MED ORDER — OXYCODONE-ACETAMINOPHEN 5-325 MG PO TABS
1.0000 | ORAL_TABLET | Freq: Once | ORAL | Status: AC
  Administered 2023-07-26: 1 via ORAL
  Filled 2023-07-26: qty 1

## 2023-07-26 MED ORDER — LIDOCAINE 5 % EX PTCH
1.0000 | MEDICATED_PATCH | CUTANEOUS | Status: DC
  Administered 2023-07-26: 1 via TRANSDERMAL
  Filled 2023-07-26: qty 1

## 2023-07-26 MED ORDER — OXYCODONE-ACETAMINOPHEN 5-325 MG PO TABS
1.0000 | ORAL_TABLET | Freq: Three times a day (TID) | ORAL | 0 refills | Status: AC | PRN
Start: 2023-07-26 — End: 2023-07-29

## 2023-07-26 MED ORDER — METHOCARBAMOL 500 MG PO TABS: 500.0000 mg | ORAL_TABLET | Freq: Three times a day (TID) | ORAL | 0 refills | Status: DC | PRN

## 2023-07-26 MED ORDER — IBUPROFEN 200 MG PO TABS
600.0000 mg | ORAL_TABLET | Freq: Once | ORAL | Status: AC
  Administered 2023-07-26: 600 mg via ORAL
  Filled 2023-07-26: qty 3

## 2023-07-26 NOTE — ED Notes (Signed)
Attempted to update vital signs. Pt refused. Pt stated "why? I've been waiting 4 hours. This is crazy." Importance of updating vital signs explained to pt but pt still refused.

## 2023-07-26 NOTE — ED Provider Notes (Signed)
EMERGENCY DEPARTMENT AT [facility] Provider Note   CSN: [id number] Arrival date & time: 07/26/23  0012     History  No chief complaint on file.   [name] is a 72 y.o. female.  HPI Patient presents for right rib pain.  Medical history includes HLD, osteoporosis, anxiety, depression, HTN, asthma.  Shortly prior to arrival, patient sustained a fall.  She describes the fall as mechanical.  She was sitting on a toilet seat putting her shoes on when she slipped off.  She struck the right side of her chest and has since had pain to this area.  She denies any other areas of discomfort.    Home Medications Prior to Admission medications   Medication Sig Start Date End Date Taking? Authorizing Provider  lidocaine (LIDODERM) 5 % Place 1 patch onto the skin daily. Remove & Discard patch within 12 hours or as directed by MD 07/26/23  Yes Gloris Manchester, MD  methocarbamol (ROBAXIN) 500 MG tablet Take 1 tablet (500 mg total) by mouth every 8 (eight) hours as needed for muscle spasms. 07/26/23  Yes Gloris Manchester, MD  oxyCODONE-acetaminophen (PERCOCET/ROXICET) 5-325 MG tablet Take 1 tablet by mouth every 8 (eight) hours as needed for up to 3 days for severe pain (pain score 7-10). 07/26/23 07/29/23 Yes Gloris Manchester, MD  albuterol (PROVENTIL HFA;VENTOLIN HFA) 108 (90 BASE) MCG/ACT inhaler Inhale 2 puffs into the lungs every 4 (four) hours as needed for wheezing or shortness of breath.    [provider]  buPROPion (WELLBUTRIN XL) 150 MG 24 hr tablet Take 150 mg by mouth every morning.    [provider]  diclofenac Sodium (VOLTAREN) 1 % GEL Apply 2 g topically 4 (four) times daily. 07/07/22   Jeannie Fend, PA-C  ibuprofen (ADVIL) 200 MG tablet Take 600 mg by mouth every 6 (six) hours.    [provider]  LORazepam (ATIVAN) 1 MG tablet Take 1 tablet (1 mg total) by mouth 2 (two) times daily as needed for up to 3 days for anxiety ("Do not take with  narcotic medications. Must choose between one or the other."). Patient taking differently: Take 1 mg by mouth at bedtime. 07/19/21 03/31/22  Lorin Glass, MD  meloxicam (MOBIC) 7.5 MG tablet Take 1 tablet (7.5 mg total) by mouth daily as needed for pain. 02/27/23   Benjiman Core, MD  Multiple Vitamin (MULTIVITAMIN WITH MINERALS) TABS tablet Take 1 tablet by mouth daily.    [provider]  traZODone (DESYREL) 100 MG tablet Take 100-200 mg by mouth at bedtime. 02/17/22   [provider]  venlafaxine XR (EFFEXOR-XR) 150 MG 24 hr capsule Take 150 mg by mouth at bedtime.    [provider]      Allergies    Penicillins and Vancomycin    Review of Systems   Review of Systems  Cardiovascular:  Positive for chest pain.  All other systems reviewed and are negative.   Physical Exam Updated Vital Signs BP (!) 149/117   Pulse 64   Temp 98.5 F (36.9 C) (Oral)   Resp 17   SpO2 98%  Physical Exam Vitals and nursing note reviewed.  Constitutional:      General: She is not in acute distress.    Appearance: Normal appearance. She is well-developed. She is not ill-appearing, toxic-appearing or diaphoretic.  HENT:     Head: Normocephalic and atraumatic.     Right Ear: External ear normal.  Left Ear: External ear normal.     Nose: Nose normal.     Mouth/Throat:     Mouth: Mucous membranes are moist.  Eyes:     Extraocular Movements: Extraocular movements intact.     Conjunctiva/sclera: Conjunctivae normal.  Cardiovascular:     Rate and Rhythm: Normal rate and regular rhythm.  Pulmonary:     Effort: Pulmonary effort is normal. No respiratory distress.  Chest:     Chest wall: Tenderness present.  Abdominal:     General: There is no distension.     Palpations: Abdomen is soft.     Tenderness: There is no abdominal tenderness.  Musculoskeletal:        General: No swelling.     Cervical back: Normal range of motion and neck supple.  Skin:    General: Skin  is warm and dry.     Capillary Refill: Capillary refill takes less than 2 seconds.  Neurological:     General: No focal deficit present.     Mental Status: She is alert and oriented to person, place, and time.  Psychiatric:        Mood and Affect: Mood normal.        Behavior: Behavior normal.     ED Results / Procedures / Treatments   Labs (all labs ordered are listed, but only abnormal results are displayed) Labs Reviewed - No data to display  EKG None  Radiology DG Ribs Unilateral W/Chest Right  Result Date: 07/26/2023 CLINICAL DATA:  Fall, right rib pain. EXAM: RIGHT RIBS AND CHEST - 3+ VIEW COMPARISON:  10/04/2022. FINDINGS: There are fractures of the T9 and T10 ribs on the left. Old healed rib fractures are noted on the right. Stable compression deformities and scoliosis are noted in the thoracic spine. There is no evidence of pneumothorax or pleural effusion. Both lungs are clear. Heart size and mediastinal contours are within normal limits. There is a moderate hiatal hernia. There is atherosclerotic calcification of the aorta. Bilateral breast implants are noted. Cervical spinal fusion hardware and right shoulder arthroplasty changes are noted. IMPRESSION: 1. Fractures of the T9 and T10 ribs on the left. 2. Old healed rib fractures on the right. 3. No acute cardiopulmonary process. 4. Moderate hiatal hernia. Electronically Signed   By: Thornell Sartorius M.D.   On: 07/26/2023 02:19    Procedures Procedures    Medications Ordered in ED Medications  ibuprofen (ADVIL) tablet 600 mg (has no administration in time range)  oxyCODONE-acetaminophen (PERCOCET/ROXICET) 5-325 MG per tablet 1 tablet (has no administration in time range)  lidocaine (LIDODERM) 5 % 1 patch (has no administration in time range)  methocarbamol (ROBAXIN) tablet 500 mg (has no administration in time range)    ED Course/ Medical Decision Making/ A&P                                 Medical Decision  Making Risk OTC drugs. Prescription drug management.   Patient presents after mechanical fall, during which she struck the right side of her chest and has since had pain to this area.  Workup initiated prior to patient being bedded in the ED shows fractures of ribs 9 and 10 on the right side.  On assessment, patient is well-appearing.  She denies any significant pain with deep inspiration.  She has tenderness to area of injury.  Multimodal pain control was given in the ED.  She was  advised to continue multimodal pain control and to return to the ED for any worsening symptoms.  She was discharged in good condition.        Final Clinical Impression(s) / ED Diagnoses Final diagnoses:  Closed fracture of multiple ribs of right side, initial encounter    Rx / DC Orders ED Discharge Orders          Ordered    lidocaine (LIDODERM) 5 %  Every 24 hours        07/26/23 0627    methocarbamol (ROBAXIN) 500 MG tablet  Every 8 hours PRN        07/26/23 0627    oxyCODONE-acetaminophen (PERCOCET/ROXICET) 5-325 MG tablet  Every 8 hours PRN        07/26/23 0627              Gloris Manchester, MD 07/26/23 [phone]

## 2023-07-26 NOTE — Discharge Instructions (Addendum)
Take ibuprofen and Tylenol.  Prescriptions were sent to your pharmacy for additional modes of pain control.  Lidocaine patches can be replaced daily.  Take muscle relaxer, methocarbamol, as needed.  Percocet is a narcotic pain medication.  Take only as needed and take with caution.  Return to the emergency department for any new or worsening symptoms of concern.

## 2023-07-26 NOTE — ED Triage Notes (Addendum)
Pt. Bib [facility] from the [facility] for a fall about 30 hrs ago. C/o right rib pain. Pain is bearable at rest. A&O x4. VSS with EMS.

## 2023-07-27 ENCOUNTER — Emergency Department (HOSPITAL_COMMUNITY)
Admission: EM | Admit: 2023-07-27 | Discharge: 2023-07-28 | Disposition: A | Discharge status: Rehabilitation Facility | Payer: 59 | Attending: Emergency Medicine | Admitting: Emergency Medicine

## 2023-07-27 ENCOUNTER — Emergency Department (HOSPITAL_COMMUNITY): Payer: 59

## 2023-07-27 ENCOUNTER — Encounter (HOSPITAL_COMMUNITY): Payer: Self-pay | Admitting: Emergency Medicine

## 2023-07-27 ENCOUNTER — Other Ambulatory Visit: Payer: Self-pay

## 2023-07-27 DIAGNOSIS — Z96652 Presence of left artificial knee joint: Secondary | ICD-10-CM | POA: Diagnosis not present

## 2023-07-27 DIAGNOSIS — M179 Osteoarthritis of knee, unspecified: Secondary | ICD-10-CM | POA: Insufficient documentation

## 2023-07-27 DIAGNOSIS — I10 Essential (primary) hypertension: Secondary | ICD-10-CM | POA: Insufficient documentation

## 2023-07-27 DIAGNOSIS — Z5900 Homelessness unspecified: Secondary | ICD-10-CM | POA: Insufficient documentation

## 2023-07-27 DIAGNOSIS — F17210 Nicotine dependence, cigarettes, uncomplicated: Secondary | ICD-10-CM | POA: Insufficient documentation

## 2023-07-27 DIAGNOSIS — Z96611 Presence of right artificial shoulder joint: Secondary | ICD-10-CM | POA: Insufficient documentation

## 2023-07-27 DIAGNOSIS — M25561 Pain in right knee: Secondary | ICD-10-CM | POA: Diagnosis present

## 2023-07-27 DIAGNOSIS — J45909 Unspecified asthma, uncomplicated: Secondary | ICD-10-CM | POA: Diagnosis not present

## 2023-07-27 DIAGNOSIS — G8929 Other chronic pain: Secondary | ICD-10-CM

## 2023-07-27 DIAGNOSIS — M1711 Unilateral primary osteoarthritis, right knee: Secondary | ICD-10-CM

## 2023-07-27 MED ORDER — OXYCODONE HCL 5 MG PO TABS
5.0000 mg | ORAL_TABLET | Freq: Once | ORAL | Status: AC
  Administered 2023-07-27: 5 mg via ORAL
  Filled 2023-07-27: qty 1

## 2023-07-27 MED ORDER — ACETAMINOPHEN 500 MG PO TABS
1000.0000 mg | ORAL_TABLET | Freq: Once | ORAL | Status: AC
  Administered 2023-07-27: 1000 mg via ORAL
  Filled 2023-07-27: qty 2

## 2023-07-27 MED ORDER — IBUPROFEN 400 MG PO TABS
600.0000 mg | ORAL_TABLET | Freq: Once | ORAL | Status: AC
  Administered 2023-07-27: 600 mg via ORAL
  Filled 2023-07-27: qty 1

## 2023-07-27 NOTE — ED Notes (Signed)
Assumed care of patient, NAD noted at this time, pt resting in bed, respirations even and unlabored, skin warm and dry, bed in low position, call light within reach. Comfort measures offered, pt requesting pillow. Waiting for placement, pt verbalized understanding, pt denies any other needs at this time.

## 2023-07-27 NOTE — ED Notes (Signed)
Pt taken to the bathroom via wheelchair. Able to care for her personal needs.

## 2023-07-27 NOTE — Evaluation (Signed)
Physical Therapy Evaluation Patient Details Name: [name] MRN: [id number] DOB: Nov 21, 1950 Today's Date: 07/27/2023  History of Present Illness  72 y.o. female presents to [facility] hospital on 07/26/2023 with R knee pain. PMH includes anxiety, OA, asthma, depression, GERD, HLD, HTN.  Clinical Impression  Pt presents to PT with significant R knee pain which limits her tolerance for weightbearing and mobility. Pt expresses pain with active and passive knee flexion, and is unable to consistently weight bear through RLE to progress to ambulation away from the bedside. Pt is encouraged to perform gradual AROM of R knee as tolerated to aide in improving ROM. PT recommends short term inpatient PT services as the pt has no current caregiver support and is unable to care for herself in her current state.      If plan is discharge home, recommend the following: A lot of help with walking and/or transfers;A lot of help with bathing/dressing/bathroom;Assistance with cooking/housework;Assist for transportation;Help with stairs or ramp for entrance   Can travel by private vehicle   No    Equipment Recommendations Wheelchair (measurements PT)  Recommendations for Other Services       Functional Status Assessment Patient has had a recent decline in their functional status and demonstrates the ability to make significant improvements in function in a reasonable and predictable amount of time.     Precautions / Restrictions Precautions Precautions: Fall Precaution Comments: R knee pain Required Braces or Orthoses: Other Brace Other Brace: knee sleeve Restrictions Weight Bearing Restrictions: No      Mobility  Bed Mobility Overal bed mobility: Needs Assistance Bed Mobility: Supine to Sit, Sit to Supine     Supine to sit: Min assist, HOB elevated Sit to supine: Min assist, HOB elevated        Transfers Overall transfer level: Needs assistance Equipment used: Rollator (4  wheels) Transfers: Sit to/from Stand Sit to Stand: Min assist, From elevated surface           General transfer comment: R knee buckling with initial attempt, posterior loss of balance with 2nd attempt. Successful on 3rd and 4th attempts    Ambulation/Gait Ambulation/Gait assistance: Min assist Gait Distance (Feet): 1 Feet (attempts ambulation twice, unable to progress more than 1 foot from bed) Assistive device: Rollator (4 wheels) Gait Pattern/deviations: Step-to pattern, Antalgic, Decreased weight shift to right, Decreased stance time - right Gait velocity: reduced Gait velocity interpretation: <1.31 ft/sec, indicative of household ambulator   General Gait Details: reduced stance time and weight shift to R side due to pain  Stairs            Wheelchair Mobility     Tilt Bed    Modified Rankin (Stroke Patients Only)       Balance Overall balance assessment: Needs assistance Sitting-balance support: No upper extremity supported, Feet supported Sitting balance-Leahy Scale: Fair     Standing balance support: Bilateral upper extremity supported, Reliant on assistive device for balance Standing balance-Leahy Scale: Poor                               Pertinent Vitals/Pain Pain Assessment Pain Assessment: 0-10 Pain Score: 9  Pain Location: R knee Pain Descriptors / Indicators: Aching Pain Intervention(s): Limited activity within patient's tolerance    Home Living Family/patient expects to be discharged to:: Shelter/Homeless                   Additional Comments:  pt was living at [facility], reports she had been unable to stay there for the last 5 days    Prior Function Prior Level of Function : Independent/Modified Independent             Mobility Comments: ambulatory with rollator       Extremity/Trunk Assessment   Upper Extremity Assessment Upper Extremity Assessment: Overall WFL for tasks assessed    Lower Extremity  Assessment Lower Extremity Assessment: Generalized weakness;RLE deficits/detail RLE Deficits / Details: pt with reports of significant pain with active or passive knee flexion    Cervical / Trunk Assessment Cervical / Trunk Assessment: Kyphotic  Communication   Communication Communication: Hearing impairment Cueing Techniques: Verbal cues  Cognition Arousal: Alert Behavior During Therapy: WFL for tasks assessed/performed Overall Cognitive Status: Within Functional Limits for tasks assessed                                          General Comments General comments (skin integrity, edema, etc.): VSS on RA    Exercises     Assessment/Plan    PT Assessment Patient needs continued PT services  PT Problem List Decreased strength;Decreased activity tolerance;Decreased balance;Decreased mobility;Decreased knowledge of use of DME;Decreased safety awareness;Decreased knowledge of precautions;Pain       PT Treatment Interventions DME instruction;Gait training;Stair training;Functional mobility training;Therapeutic exercise;Therapeutic activities;Balance training;Neuromuscular re-education;Patient/family education;Wheelchair mobility training    PT Goals (Current goals can be found in the Care Plan section)  Acute Rehab PT Goals Patient Stated Goal: to reduce R knee pain, return to independence PT Goal Formulation: With patient Time For Goal Achievement: 08/10/23 Potential to Achieve Goals: Fair Additional Goals Additional Goal #1: Pt will be able to transfer from lying on the floor to standing at a modified independent level with support of DME    Frequency Min 1X/week     Co-evaluation               AM-PAC PT "6 Clicks" Mobility  Outcome Measure Help needed turning from your back to your side while in a flat bed without using bedrails?: A Little Help needed moving from lying on your back to sitting on the side of a flat bed without using bedrails?: A  Little Help needed moving to and from a bed to a chair (including a wheelchair)?: A Lot Help needed standing up from a chair using your arms (e.g., wheelchair or bedside chair)?: A Little Help needed to walk in hospital room?: Total Help needed climbing 3-5 steps with a railing? : Total 6 Click Score: 13    End of Session Equipment Utilized During Treatment: Gait belt Activity Tolerance: Patient limited by pain Patient left: in bed;with call bell/phone within reach Nurse Communication: Mobility status PT Visit Diagnosis: Other abnormalities of gait and mobility (R26.89);Pain Pain - Right/Left: Right Pain - part of body: Knee    Time: 1217-1235 PT Time Calculation (min) (ACUTE ONLY): 18 min   Charges:   PT Evaluation $PT Eval Low Complexity: 1 Low   PT General Charges $$ ACUTE PT VISIT: 1 Visit         Arlyss Gandy, PT, DPT Acute Rehabilitation Office [phone]   Arlyss Gandy 07/27/2023, 1:01 PM

## 2023-07-27 NOTE — Discharge Instructions (Addendum)
RESOURCE GUIDE  Chronic Pain Problems: Contact [facility]  [phone] Patients need to be referred by their primary care doctor.  Insufficient Money for Medicine: Contact [employer]:  call [phone]  No Primary Care Doctor: Call [facility]  [phone] - can help you locate a primary care doctor that  accepts your insurance, provides certain services, etc. Physician Referral Service- [phone]  Agencies that provide inexpensive medical care: [facility]  [phone] [facility]  [phone] [facility]  [phone] [facility]  [phone] [facility]  [phone] [facility]  [phone]  Medicaid-accepting [facility] Providers: [facility]- [address]  [phone], Mon-Fri 9am-7pm, Sat 9am-1pm [facility]- [address] [place], [address] [place]  [phone] [facility]- [address], [address] [place]  [phone] [facility]- [address]  [phone] Renaye Rakers- [address] [place], [address], [phone]  Only accepts [place] patients after they have their name  applied to their card  Self Pay (no insurance) in [facility]: Sickle Cell Patients - [facility]  [address] [place], [phone] [facility]- [address] [place]  [phone]       [facility] [place]- [address]       -     [facility]- see information above (Speak to [employer] if you do not have insurance)       -  [facility]- [address],  [phone]       -  [facility]- [address] [phone]       -  Dr Julio Sicks-  [address], [place], [phone]       -  [facility] - [address] [phone]       -  [facility]- [address] [phone], also [address]  [phone]       -     [facility]- [address] [place], [phone], 1st & 3rd Saturday         every month, 10am-1pm  -     [facility]   [address] [place], [place].   Phone:  [phone], Fax:  [phone]. Hours of Operation:  9 am - 6 pm, M-F.  -     [facility]   [address], [place]   Phone: [phone], Fax: [phone]. Hours of Operation:  8:30 am - 5:30 pm, M-F.    Dental Assistance If unable to pay or uninsured, contact:  [facility]. to become qualified for the adult dental clinic.  Patients with Medicaid: [facility] [phone], [phone] [address] [phone]  If unable to pay, or uninsured, contact [facility] [phone] in [place], [phone] in [facility]) to become qualified for the adult dental clinic  [facility] [address] [place], [place] [address] [phone] [email or web address]  Other Proofreader Services: [facility]- [address] [place], [place], [place], [address], [phone], Ext. [phone], 2nd and 4th Thursday of the month at 6:30am.  10 clients each day by appointment, can sometimes see walk-in patients if someone does not show for an appointment.  [facility]- [address] [place], [place], [address], [phone] [facility] [address] [place], [place], [address], [phone] [facility] Health Department- [phone] [facility] Health Department- [phone] [facility] [phone]      It was a pleasure caring for you today in the emergency department.  Please return to the emergency department for any worsening or worrisome symptoms.
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
## 2023-07-27 NOTE — ED Provider Notes (Addendum)
[place] EMERGENCY DEPARTMENT AT [facility] Provider Note  CSN: [id number] Arrival date & time: 07/27/23 9562  Chief Complaint(s) Knee Pain  HPI CATALEAH BERON is a 72 y.o. female with past medical history as below, significant for homeless, chronic pain, GERD, HLD, HTN who presents to the ED with complaint of knee pain right  Patient is homeless, she has been living at [facility] over the past year.  This morning she began having pain to her right knee.  Feels the pain is exacerbated by the cold and being outside.  She was seen recently and unable to get a prescription secondary to transportation issues.  No fevers, chills, nausea or vomiting.  No IVDU.  No back pain.  No complaints otherwise  Past Medical History Past Medical History:  Diagnosis Date   Anxiety    Arthritis    back, joints   Asthma    Chronic pain    Constipation    Depression    Dysrhythmia    Palpitations   GERD (gastroesophageal reflux disease)    High cholesterol    Hypertension    resolved whn she stopped drinking alcohol   Mitral prolapse    dx as a child   Osteopenia    Osteoporosis    Patient Active Problem List   Diagnosis Date Noted   Hemothorax 07/09/2021   Acute respiratory failure with hypoxia (HCC) 07/09/2021   Acute posthemorrhagic anemia 07/09/2021   Compression fracture of T10 vertebra (HCC) 07/09/2021   Cognitive impairment 07/09/2021   Anxiety 07/09/2021   Arthritis of left knee 03/27/2020   Humeral surgical neck fracture 02/27/2012    Class: Acute   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 Authorizing Provider   albuterol (PROVENTIL HFA;VENTOLIN HFA) 108 (90 BASE) MCG/ACT inhaler Inhale 2 puffs into the lungs every 4 (four) hours as needed for wheezing or shortness of breath.    [provider]  buPROPion (WELLBUTRIN XL) 150 MG 24 hr tablet Take 150 mg by mouth every morning.    [provider]  diclofenac Sodium (VOLTAREN) 1 % GEL Apply 2 g topically 4 (four) times daily. 07/07/22   Jeannie Fend, PA-C  ibuprofen (ADVIL) 200 MG tablet Take 600 mg by mouth every 6 (six) hours.    [provider]  lidocaine (LIDODERM) 5 % Place 1 patch onto the skin daily. Remove & Discard patch within 12 hours or as directed by MD 07/26/23   Gloris [place], MD  LORazepam (ATIVAN) 1 MG tablet Take 1 tablet (1 mg total) by mouth 2 (two) times daily as needed for up to 3 days for anxiety ("Do not take with narcotic medications. Must choose between one or the other."). Patient taking differently: Take 1 mg by mouth at bedtime. 07/19/21 03/31/22  Lorin Glass, MD  meloxicam (MOBIC) 7.5 MG tablet Take 1 tablet (7.5 mg total) by mouth daily as needed for pain. 02/27/23   Benjiman Core, MD  methocarbamol (ROBAXIN) 500 MG tablet Take 1 tablet (500 mg total) by mouth every 8 (eight) hours as needed for muscle spasms. 07/26/23   Gloris [place], MD  Multiple  Vitamin (MULTIVITAMIN WITH MINERALS) TABS tablet Take 1 tablet by mouth daily.    [provider]  oxyCODONE-acetaminophen (PERCOCET/ROXICET) 5-325 MG tablet Take 1 tablet by mouth every 8 (eight) hours as needed for up to 3 days for severe pain (pain score 7-10). 07/26/23 07/29/23  Gloris Manchester, MD  traZODone (DESYREL) 100 MG tablet Take 100-200 mg by mouth at bedtime. 02/17/22   [provider]  venlafaxine XR (EFFEXOR-XR) 150 MG 24 hr capsule Take 150 mg by mouth at bedtime.    [provider]                                                                                                                                    Past  Surgical History Past Surgical History:  Procedure Laterality Date   ABDOMINAL HYSTERECTOMY  1985   BACK SURGERY     BREAST SURGERY     augmentation   HARDWARE REMOVAL  02/27/2012   Procedure: HARDWARE REMOVAL;  Surgeon: Kerrin Champagne, MD;  Location: [facility] OR;  Service: Orthopedics;;  exploration and removal of portion of screw at T-!   JOINT REPLACEMENT Right    shoulder   left hip replacement  02-27-12   just screw - no replacement   NECK SURGERY     plates and screws   ORIF WRIST FRACTURE  1986   right   right femur replacement     ROTATOR CUFF REPAIR     right   SHOULDER HEMI-ARTHROPLASTY  02/27/2012   Procedure: SHOULDER HEMI-ARTHROPLASTY;  Surgeon: Kerrin Champagne, MD;  Location: [facility] OR;  Service: Orthopedics;  Laterality: Right;  Right shoulder hemiarthroplasty, repair right rotator cuff   TONSILLECTOMY     TOTAL KNEE ARTHROPLASTY Left 03/27/2020   TOTAL KNEE ARTHROPLASTY Left 03/27/2020   Procedure: LEFT TOTAL KNEE ARTHROPLASTY;  Surgeon: Cammy Copa, MD;  Location: [facility] OR;  Service: Orthopedics;  Laterality: Left;   WRIST SURGERY     tendon repair from a dog bite- left   Family History History reviewed. No pertinent family history.  Social History Social History   Tobacco Use   Smoking status: Every Day    Current packs/day: 0.50    Average packs/day: 0.5 packs/day for 50.0 years (25.0 ttl pk-yrs)    Types: Cigarettes   Smokeless tobacco: Never  Vaping Use   Vaping status: Never Used  Substance Use Topics   Alcohol use: Yes    Comment: ocasionally   Drug use: No    Comment: quit in 1998   Allergies Penicillins and Vancomycin  Review of Systems Review of Systems  Constitutional:  Negative for fever.  Respiratory:  Negative for chest tightness.   Genitourinary:  Negative for dysuria.  Musculoskeletal:  Positive for arthralgias.  Skin:  Negative for rash and wound.  Neurological:  Negative for weakness.  All other systems reviewed and are  negative.   Physical Exam Vital Signs  I have reviewed the triage vital signs BP (!) 150/82 (BP Location: Left Arm)   Pulse 62   Temp 99 F (37.2 C) (Oral)   Resp 20   SpO2 100%  Physical Exam Vitals and nursing note reviewed.  Constitutional:      General: She is not in acute distress.    Appearance: Normal appearance. She is well-developed. She is not ill-appearing.  HENT:     Head: Normocephalic and atraumatic.     Right Ear: External ear normal.     Left Ear: External ear normal.     Nose: Nose normal.     Mouth/Throat:     Mouth: Mucous membranes are moist.  Eyes:     General: No scleral icterus.       Right eye: No discharge.        Left eye: No discharge.  Cardiovascular:     Rate and Rhythm: Normal rate.  Pulmonary:     Effort: Pulmonary effort is normal. No respiratory distress.     Breath sounds: No stridor.  Abdominal:     General: Abdomen is flat. There is no distension.     Tenderness: There is no guarding.  Musculoskeletal:        General: No deformity.     Cervical back: No rigidity.       Legs:     Comments: LE NVI.  No external evidence of trauma to either knee.  Quadricep, patella, Achilles tendon intact to lower extremities  No significant erythema or wound to the right knee  Skin:    General: Skin is warm and dry.     Coloration: Skin is not cyanotic, jaundiced or pale.  Neurological:     Mental Status: She is alert and oriented to person, place, and time.     GCS: GCS eye subscore is 4. GCS verbal subscore is 5. GCS motor subscore is 6.  Psychiatric:        Speech: Speech normal.        Behavior: Behavior normal. Behavior is cooperative.     ED Results and Treatments Labs (all labs ordered are listed, but only abnormal results are displayed) Labs Reviewed - No data to display                                                                                                                        Radiology DG Knee Complete 4 Views  Right  Result Date: 07/27/2023 CLINICAL DATA:  patella pain.  Bilateral knee pain. EXAM: RIGHT KNEE - COMPLETE 4+ VIEW COMPARISON:  07/07/2022. FINDINGS: No acute fracture or dislocation. No aggressive osseous lesion. Patient is status post proximal right tibial ORIF without significant interval change. The hardware is intact. No periprosthetic fracture or lucency. Old healed proximal right fibular fracture also seen without significant interval change. There are mild-to-moderate degenerative changes of the knee joint in the form of moderately reduced tibiofemoral compartment joint space, asymmetrically involving medial compartment along with tibial spiking  and tricompartmental osteophytosis. There is also meniscal chondrocalcinosis. No knee effusion or focal soft tissue swelling. No radiopaque foreign bodies. IMPRESSION: *No acute osseous abnormality of the right knee joint. Electronically Signed   By: Jules Schick M.D.   On: 07/27/2023 09:10    Pertinent labs & imaging results that were available during my care of the patient were reviewed by me and considered in my medical decision making (see MDM for details).  Medications Ordered in ED Medications  ibuprofen (ADVIL) tablet 600 mg (600 mg Oral Given 07/27/23 0846)  acetaminophen (TYLENOL) tablet 1,000 mg (1,000 mg Oral Given 07/27/23 0846)                                                                                                                                     Procedures Procedures  (including critical care time)  Medical Decision Making / ED Course    Medical Decision Making:    BONDA LUSBY is a 72 y.o. female with past medical history as below, significant for homeless, chronic pain, GERD, HLD, HTN who presents to the ED with complaint of knee pain right. The complaint involves an extensive differential diagnosis and also carries with it a high risk of complications and morbidity.  Serious etiology was considered. Ddx  includes but is not limited to: Brein, strain, soft tissue injury, ligamentous injury, etc.  Complete initial physical exam performed, notably the patient  was no acute distress, asleep on initial assessment.    Reviewed and confirmed nursing documentation for past medical history, family history, social history.  Vital signs reviewed.    Clinical Course as of 07/27/23 1016  Mon Jul 27, 2023  1610 Knee XR w/o acute process. There are chronic degenerative changes noted  [SG]  1013 Feeling better on recheck.  Plan for discharge [SG]    Clinical Course User Index [SG] Sloan Leiter, DO     Patient is homeless, chronic pain, worsened pain to her right knee.  Unable to fill recent prescription secondary to transportation/cost issues  Will get x-ray, give Motrin/Tylenol.  X-ray reassuring.  Will give knee sleeve, she has walker already. Outpatient homeless resources provided.   She is having a lot of difficulty getting up and using her walker with the knee pain, will get PT eval   PT has eval pt, they are recommending SNF. Pt is homeless, unable to get regularly primary care due to transportation issues, homelessness. Will consult TOC for SNF placement              Additional history obtained: -Additional history obtained from na -External records from outside source obtained and reviewed including: Chart review including previous notes, labs, imaging, consultation notes including  Prior ed visits Prior imaging Home meds   Lab Tests: na  EKG   EKG Interpretation Date/Time:    Ventricular Rate:    PR Interval:    QRS Duration:  QT Interval:    QTC Calculation:   R Axis:      Text Interpretation:           Imaging Studies ordered: I ordered imaging studies including knee XR I independently visualized the following imaging with scope of interpretation limited to determining acute life threatening conditions related to emergency care; findings noted above I  independently visualized and interpreted imaging. I agree with the radiologist interpretation   Medicines ordered and prescription drug management: Meds ordered this encounter  Medications   ibuprofen (ADVIL) tablet 600 mg   acetaminophen (TYLENOL) tablet 1,000 mg    -I have reviewed the patients home medicines and have made adjustments as needed   Consultations Obtained: na   Cardiac Monitoring: Continuous pulse oximetry interpreted by myself, 99% on RA.    Social Determinants of Health:  Diagnosis or treatment significantly limited by social determinants of health: current smoker homeless    Reevaluation: After the interventions noted above, I reevaluated the patient and found that they have improved  Co morbidities that complicate the patient evaluation  Past Medical History:  Diagnosis Date   Anxiety    Arthritis    back, joints   Asthma    Chronic pain    Constipation    Depression    Dysrhythmia    Palpitations   GERD (gastroesophageal reflux disease)    High cholesterol    Hypertension    resolved whn she stopped drinking alcohol   Mitral prolapse    dx as a child   Osteopenia    Osteoporosis       Dispostion: Disposition decision including need for hospitalization was considered, and patient discharged from emergency department.    Final Clinical Impression(s) / ED Diagnoses Final diagnoses:  Chronic pain of right knee  Osteoarthritis of right knee, unspecified osteoarthritis type  Homeless        Sloan Leiter, DO 07/27/23 1016    Tanda Rockers A, DO 07/27/23 1253

## 2023-07-27 NOTE — ED Notes (Signed)
Patient given food and beverage at provider request

## 2023-07-27 NOTE — TOC Progression Note (Signed)
Transition of Care [facility]) - Progression Note    Patient Details  Name: [name] MRN: [id number] Date of Birth: 05-26-1951  [facility] of [facility]) CM/SW Contact  Dannielle Karvonen Phone Number: 07/27/2023, 9:58 PM  Clinical Narrative:     CSW spoke with pt via phone, bed offers presented, pt very hard of hearing, states she has been to [facility] but ultimately did not make a decision. Day shift CSW will follow up with pt tomorrow for choice.        Expected Discharge Plan and Services                                               Social Determinants of Health (SDOH) Interventions SDOH Screenings   Food Insecurity: No Food Insecurity (01/10/2021)   Received from [facility], [facility]  Social Connections: Unknown (01/24/2022)   Received from [facility], [facility]  Tobacco Use: High Risk (07/27/2023)    Readmission Risk Interventions     No data to display

## 2023-07-27 NOTE — ED Notes (Signed)
Ortho tech called, no answer. Will apply

## 2023-07-27 NOTE — Discharge Planning (Signed)
Licensed Clinical Social Worker is seeking post-discharge placement for this patient at the following level of care: skilled nursing facility    

## 2023-07-27 NOTE — ED Triage Notes (Signed)
Pt here with c/o bil knee pain from being out in the cold since Friday , pt is homeless

## 2023-07-27 NOTE — ED Notes (Signed)
Patient became agitated with staff, as we tried to help get her to bathroom. I advised we were here to help, and she did not need to yell or fuss at [place], that we were simply trying to help.

## 2023-07-27 NOTE — NC FL2 (Signed)
[place] MEDICAID FL2 LEVEL OF CARE FORM     IDENTIFICATION  Patient Name: [name] Birthdate: [birth year] Sex: female Admission Date (Current Location): 07/27/2023  [facility] [place] Number:  Producer, television/film/video and Address:  [facility], [address], [place], [place] [address]      Provider Number: [id number]  Attending Physician Name and Address:  Sloan Leiter, DO  Relative Name and Phone Number:       Current Level of Care: Hospital Recommended Level of Care: Skilled Nursing Facility Prior Approval Number:    Date Approved/Denied:   PASRR Number: [id number]  Discharge Plan: SNF    Current Diagnoses: Patient Active Problem List   Diagnosis Date Noted   Hemothorax 07/09/2021   Acute respiratory failure with hypoxia (HCC) 07/09/2021   Acute posthemorrhagic anemia 07/09/2021   Compression fracture of T10 vertebra (HCC) 07/09/2021   Cognitive impairment 07/09/2021   Anxiety 07/09/2021   Arthritis of left knee 03/27/2020   Humeral surgical neck fracture 02/27/2012   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/27/2012   Pulmonary nodule 02/27/2012   Drug-seeking behavior 02/25/2011   ROTATOR CUFF REPAIR, RIGHT, HX OF 03/27/2010   HYPERLIPIDEMIA 02/21/2010   NECK PAIN, CHRONIC 11/17/2007   Depression 11/11/2007   Osteoporosis 11/11/2007    Orientation RESPIRATION BLADDER Height & Weight     Self, Time, Situation, Place  Normal Continent Weight: 119 lb 14.9 oz (54.4 kg) Height:  5\' 8"  (172.7 cm)  BEHAVIORAL SYMPTOMS/MOOD NEUROLOGICAL BOWEL NUTRITION STATUS      Continent Diet  AMBULATORY STATUS COMMUNICATION OF NEEDS Skin   Extensive Assist Verbally Normal                       Personal Care Assistance Level of Assistance  Bathing, Dressing, Feeding Bathing Assistance: Limited assistance Feeding assistance: Independent Dressing Assistance: Limited assistance     Functional Limitations Info  Sight, Hearing, Speech Sight  Info: Adequate Hearing Info: Impaired Speech Info: Adequate    SPECIAL CARE FACTORS FREQUENCY  OT (By licensed OT), PT (By licensed PT)     PT Frequency: 5x weekly OT Frequency: 5x weekly            Contractures Contractures Info: Not present    Additional Factors Info  Code Status, Allergies Code Status Info: Full Code Allergies Info: Penicllins, Vancomycin           Current Medications (07/27/2023):  This is the current hospital active medication list No current facility-administered medications for this encounter.   Current Outpatient Medications  Medication Sig Dispense Refill   albuterol (PROVENTIL HFA;VENTOLIN HFA) 108 (90 BASE) MCG/ACT inhaler Inhale 2 puffs into the lungs every 4 (four) hours as needed for wheezing or shortness of breath.     buPROPion (WELLBUTRIN XL) 150 MG 24 hr tablet Take 150 mg by mouth every morning.     diclofenac Sodium (VOLTAREN) 1 % GEL Apply 2 g topically 4 (four) times daily. 100 g 0   ibuprofen (ADVIL) 200 MG tablet Take 600 mg by mouth every 6 (six) hours.     lidocaine (LIDODERM) 5 % Place 1 patch onto the skin daily. Remove & Discard patch within 12 hours or as directed by MD 5 patch 0   LORazepam (ATIVAN) 1 MG tablet Take 1 tablet (1 mg total) by mouth 2 (two) times daily as needed for up to 3 days for anxiety ("Do not take with narcotic medications. Must choose  between one or the other."). (Patient taking differently: Take 1 mg by mouth at bedtime.) 6 tablet 0   meloxicam (MOBIC) 7.5 MG tablet Take 1 tablet (7.5 mg total) by mouth daily as needed for pain. 10 tablet 0   methocarbamol (ROBAXIN) 500 MG tablet Take 1 tablet (500 mg total) by mouth every 8 (eight) hours as needed for muscle spasms. 20 tablet 0   Multiple Vitamin (MULTIVITAMIN WITH MINERALS) TABS tablet Take 1 tablet by mouth daily.     oxyCODONE-acetaminophen (PERCOCET/ROXICET) 5-325 MG tablet Take 1 tablet by mouth every 8 (eight) hours as needed for up to 3 days for  severe pain (pain score 7-10). 9 tablet 0   traZODone (DESYREL) 100 MG tablet Take 100-200 mg by mouth at bedtime.     venlafaxine XR (EFFEXOR-XR) 150 MG 24 hr capsule Take 150 mg by mouth at bedtime.       Discharge Medications: Please see discharge summary for a list of discharge medications.  Relevant Imaging Results:  Relevant Lab Results:   Additional Information SSN: [id number]  Inis Sizer, LCSW

## 2023-07-28 DIAGNOSIS — M179 Osteoarthritis of knee, unspecified: Secondary | ICD-10-CM | POA: Diagnosis not present

## 2023-07-28 MED ORDER — LORAZEPAM 1 MG PO TABS: 1.0000 mg | ORAL_TABLET | Freq: Every day | ORAL | Status: DC

## 2023-07-28 MED ORDER — ACETAMINOPHEN 500 MG PO TABS
1000.0000 mg | ORAL_TABLET | Freq: Once | ORAL | Status: AC
  Administered 2023-07-28: 1000 mg via ORAL
  Filled 2023-07-28: qty 2

## 2023-07-28 MED ORDER — ALBUTEROL SULFATE (2.5 MG/3ML) 0.083% IN NEBU: 3.0000 mL | INHALATION_SOLUTION | RESPIRATORY_TRACT | Status: DC | PRN

## 2023-07-28 MED ORDER — OXYCODONE HCL 5 MG PO TABS
5.0000 mg | ORAL_TABLET | Freq: Once | ORAL | Status: AC
  Administered 2023-07-28: 5 mg via ORAL
  Filled 2023-07-28: qty 1

## 2023-07-28 MED ORDER — TRAZODONE HCL 50 MG PO TABS: 100.0000 mg | ORAL_TABLET | Freq: Every day | ORAL | Status: DC

## 2023-07-28 MED ORDER — VENLAFAXINE HCL ER 75 MG PO CP24: 150.0000 mg | ORAL_CAPSULE | Freq: Every day | ORAL | Status: DC

## 2023-07-28 MED ORDER — ALBUTEROL SULFATE HFA 108 (90 BASE) MCG/ACT IN AERS: 2.0000 | INHALATION_SPRAY | RESPIRATORY_TRACT | Status: DC | PRN

## 2023-07-28 MED ORDER — BUPROPION HCL ER (XL) 150 MG PO TB24
150.0000 mg | ORAL_TABLET | Freq: Every morning | ORAL | Status: DC
  Administered 2023-07-28: 150 mg via ORAL
  Filled 2023-07-28: qty 1

## 2023-07-28 NOTE — ED Notes (Signed)
[name] called no eta given

## 2023-07-28 NOTE — ED Notes (Signed)
NAD noted at this time, pt sleeping in bed, respirations even and unlabored, skin warm and dry, bed in low position, call light within reach. Will continue to monitor.

## 2023-07-28 NOTE — ED Provider Notes (Addendum)
Emergency Medicine Observation Re-evaluation Note  [name] is a 72 y.o. female, seen on rounds today.  Pt initially presented to the ED for complaints of Knee Pain Currently, the patient is resting in bed.  Physical Exam  BP (!) 152/62 (BP Location: Right Arm)   Pulse 75   Temp 100 F (37.8 C) (Oral)   Resp 19   Ht 5\' 8"  (1.727 m)   Wt 54.4 kg   SpO2 100%   BMI 18.24 kg/m  Physical Exam General: NAD   ED Course / MDM  EKG:   I have reviewed the labs performed to date as well as medications administered while in observation.  Recent changes in the last 24 hours include presented yesterday, pending SNF placement.  Plan  Current plan is for SW for placement.    Ernie Avena, MD 07/28/23 0904  1:41PM: Per SW, pt to be discharged to [facility] rehab facility.   Ernie Avena, MD 07/28/23 [phone]

## 2023-07-28 NOTE — ED Notes (Signed)
NAD noted at this time, pt sleeping in bed, respirations even and unlabored, skin warm and dry, bed in low position, call light within reach.

## 2023-07-28 NOTE — Progress Notes (Signed)
CSW confirmed with hospital liaison Lester [place] on patients bed offer to [facility]. CSW spoke with patient who agrees with plan. Insurance authorization has been started in [place] [id number].

## 2023-07-28 NOTE — ED Notes (Signed)
Pt is very hard of hearing. Pt resting in bed and eating breakfast.

## 2023-07-28 NOTE — Progress Notes (Signed)
CSW confirmed with Lester [place] that patient can admit today. Patient will be going to room 119 at [facility]. Patient was notified.

## 2023-11-16 ENCOUNTER — Other Ambulatory Visit: Payer: Self-pay

## 2023-11-16 ENCOUNTER — Emergency Department (HOSPITAL_COMMUNITY)

## 2023-11-16 ENCOUNTER — Emergency Department (HOSPITAL_COMMUNITY)
Admission: EM | Admit: 2023-11-16 | Discharge: 2023-11-17 | Disposition: A | Discharge status: Home / Self Care | Attending: Emergency Medicine | Admitting: Emergency Medicine

## 2023-11-16 ENCOUNTER — Encounter (HOSPITAL_COMMUNITY): Payer: Self-pay

## 2023-11-16 DIAGNOSIS — F419 Anxiety disorder, unspecified: Secondary | ICD-10-CM | POA: Diagnosis not present

## 2023-11-16 DIAGNOSIS — M25562 Pain in left knee: Secondary | ICD-10-CM | POA: Insufficient documentation

## 2023-11-16 DIAGNOSIS — M25462 Effusion, left knee: Secondary | ICD-10-CM | POA: Insufficient documentation

## 2023-11-16 DIAGNOSIS — W010XXA Fall on same level from slipping, tripping and stumbling without subsequent striking against object, initial encounter: Secondary | ICD-10-CM | POA: Diagnosis not present

## 2023-11-16 DIAGNOSIS — Z5900 Homelessness unspecified: Secondary | ICD-10-CM | POA: Diagnosis not present

## 2023-11-16 DIAGNOSIS — F329 Major depressive disorder, single episode, unspecified: Secondary | ICD-10-CM | POA: Diagnosis present

## 2023-11-16 MED ORDER — ACETAMINOPHEN 325 MG PO TABS: 650.0000 mg | ORAL_TABLET | Freq: Four times a day (QID) | ORAL | Status: DC | PRN

## 2023-11-16 MED ORDER — ACETAMINOPHEN 325 MG PO TABS
650.0000 mg | ORAL_TABLET | Freq: Once | ORAL | Status: AC
  Administered 2023-11-16: 650 mg via ORAL
  Filled 2023-11-16: qty 2

## 2023-11-16 MED ORDER — LIDOCAINE HCL (PF) 1 % IJ SOLN
30.0000 mL | Freq: Once | INTRAMUSCULAR | Status: AC
  Administered 2023-11-16: 30 mL
  Filled 2023-11-16: qty 30

## 2023-11-16 NOTE — ED Provider Notes (Signed)
 [place] EMERGENCY DEPARTMENT AT [facility] Provider Note   CSN: [id number] Arrival date & time: 11/16/23  1450     History  Chief Complaint  Patient presents with   Knee Pain    [name] is a 73 y.o. female who presents emergency department for left knee injury.  She has a past medical history of homelessness for the past 6 months.  Patient has a history of knee arthroplasty of the left hip and walks with a rollator.  She tripped and fell onto her bottom and then twisted her knee.  She had immediate severe pain in the knee and large effusion.  Since that time she has been unable to bear weight on the knee.   Knee Pain      Home Medications Prior to Admission medications   Medication Sig Start Date End Date Taking? Authorizing Provider  albuterol (PROVENTIL HFA;VENTOLIN HFA) 108 (90 BASE) MCG/ACT inhaler Inhale 2 puffs into the lungs every 4 (four) hours as needed for wheezing or shortness of breath.    [provider]  buPROPion (WELLBUTRIN XL) 150 MG 24 hr tablet Take 150 mg by mouth every morning.    [provider]  diphenhydrAMINE (BENADRYL) 25 mg capsule Take 75 mg by mouth at bedtime.    [provider]  ibuprofen (ADVIL) 200 MG tablet Take 600 mg by mouth every 6 (six) hours as needed for mild pain (pain score 1-3).    [provider]  lidocaine (LIDODERM) 5 % Place 1 patch onto the skin daily. Remove & Discard patch within 12 hours or as directed by MD Patient taking differently: Place 1 patch onto the skin daily as needed (For pain). 07/26/23   Gloris Manchester, MD  LORazepam (ATIVAN) 1 MG tablet Take 1 tablet (1 mg total) by mouth 2 (two) times daily as needed for up to 3 days for anxiety ("Do not take with narcotic medications. Must choose between one or the other."). Patient taking differently: Take 1 mg by mouth at bedtime. 07/19/21 03/31/22  Lorin Glass, MD  traZODone (DESYREL) 100 MG tablet Take 100 mg by mouth at  bedtime. 02/17/22   [provider]  venlafaxine XR (EFFEXOR-XR) 150 MG 24 hr capsule Take 150 mg by mouth at bedtime.    [provider]      Allergies    Penicillins and Vancomycin    Review of Systems   Review of Systems  Physical Exam Updated Vital Signs BP (!) 149/83 (BP Location: Left Arm)   Pulse 68   Temp 97.8 F (36.6 C) (Oral)   Resp 18   Ht 5\' 8"  (1.727 m)   Wt 54 kg   SpO2 100%   BMI 18.09 kg/m  Physical Exam Vitals and nursing note reviewed.  Constitutional:      General: She is not in acute distress.    Appearance: She is well-developed. She is not diaphoretic.  HENT:     Head: Normocephalic and atraumatic.     Right Ear: External ear normal.     Left Ear: External ear normal.     Nose: Nose normal.     Mouth/Throat:     Mouth: Mucous membranes are moist.  Eyes:     General: No scleral icterus.    Conjunctiva/sclera: Conjunctivae normal.  Cardiovascular:     Rate and Rhythm: Normal rate and regular rhythm.     Heart sounds: Normal heart sounds. No murmur heard.    No friction  rub. No gallop.  Pulmonary:     Effort: Pulmonary effort is normal. No respiratory distress.     Breath sounds: Normal breath sounds.  Abdominal:     General: Bowel sounds are normal. There is no distension.     Palpations: Abdomen is soft. There is no mass.     Tenderness: There is no abdominal tenderness. There is no guarding.  Musculoskeletal:     Cervical back: Normal range of motion.     Comments: Limited range of motion due to pain left knee with large, tense, warm effusion.  Skin:    General: Skin is warm and dry.  Neurological:     Mental Status: She is alert and oriented to person, place, and time.  Psychiatric:        Behavior: Behavior normal.     ED Results / Procedures / Treatments   Labs (all labs ordered are listed, but only abnormal results are displayed) Labs Reviewed - No data to display  EKG None  Radiology DG Knee Complete 4  Views Left Result Date: 11/16/2023 CLINICAL DATA:  Pain after fall EXAM: LEFT KNEE - COMPLETE 4 VIEW COMPARISON:  None Available. FINDINGS: Total knee arthroplasty identified. Cemented femoral and tibial component. Patellar button. Osteopenia. Large joint effusion. This has a differential. No fracture or dislocation. IMPRESSION: Total knee arthroplasty. Osteopenia. Large joint effusion. The effusion has a differential. Please correlate with clinical presentation Electronically Signed   By: Karen Kays M.D.   On: 11/16/2023 17:12    Procedures .Joint Aspiration/Arthrocentesis  Date/Time: 11/16/2023 10:06 PM  Performed by: Arthor Captain, PA-C Authorized by: Arthor Captain, PA-C   Consent:    Consent obtained:  Verbal   Consent given by:  Patient   Risks discussed:  Bleeding, infection, pain and nerve damage   Alternatives discussed:  No treatment Universal protocol:    Patient identity confirmed:  Verbally with patient Location:    Location:  Knee Anesthesia:    Anesthesia method:  Local infiltration   Local anesthetic:  Lidocaine 1% w/o epi Procedure details:    Preparation: Patient was prepped and draped in usual sterile fashion     Needle gauge:  18 G   Approach:  Superior   Aspirate amount:  70   Aspirate characteristics:  Bloody   Steroid injected: no     Specimen collected: no   Post-procedure details:    Dressing:  Adhesive bandage   Procedure completion:  Tolerated well, no immediate complications     Medications Ordered in ED Medications  acetaminophen (TYLENOL) tablet 650 mg (650 mg Oral Given 11/16/23 1617)    ED Course/ Medical Decision Making/ A&P                                 Medical Decision Making Amount and/or Complexity of Data Reviewed Radiology: ordered.  Risk Prescription drug management.   73 year old female with chronic homelessness and left knee injury.  Large knee effusion which was a hemarthrosis.  I was able to remove 70 cc of fluid.   She is unable to bear weight at this time.  I have ordered PT eval for SNF placement.  Patient's pain improved to some degree after knee arthrocentesis.        Final Clinical Impression(s) / ED Diagnoses Final diagnoses:  None    Rx / DC Orders ED Discharge Orders     None  Arthor Captain, PA-C 11/16/23 2223    Elayne Snare K, DO 11/16/23 2330
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
## 2023-11-17 DIAGNOSIS — F419 Anxiety disorder, unspecified: Secondary | ICD-10-CM

## 2023-11-17 DIAGNOSIS — M25562 Pain in left knee: Secondary | ICD-10-CM | POA: Diagnosis not present

## 2023-11-17 DIAGNOSIS — Z5900 Homelessness unspecified: Secondary | ICD-10-CM

## 2023-11-17 DIAGNOSIS — F329 Major depressive disorder, single episode, unspecified: Secondary | ICD-10-CM

## 2023-11-17 NOTE — ED Notes (Signed)
 Assumed care of patient. Patient currently sleeping in bed with no signs of acute distress noted. Waiting on placement for patient.

## 2023-11-17 NOTE — Consult Note (Signed)
 [facility] Psychiatric Consult Initial  Patient Name: .[name]  MRN: [id number]  DOB: 1951/08/28  Consult Order details:  Orders (From admission, onward)     Start     Ordered   11/17/23 0901  CONSULT TO CALL ACT TEAM       Ordering Provider: Royanne Foots, DO  Provider:  (Not yet assigned)  Question:  Reason for Consult?  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  Anxiety disorder 3.  Homelessness  Assessment  [name] is a 73 y.o. female admitted: Presented to the EDfor 11/16/2023  4:49 PM for Depression and anxiety in reference to been homeless for a while. She carries the psychiatric diagnoses of Depression, anxiety and has a past medical history of  Multiple Orthopedic .issues, -Arthritis of the left knee  Her current presentation of depression is most consistent with not engaging in Mental health care and homelessness. She meets criteria for outpatient Psychiatry care referral but patient is refusing offer based on her request patient is seeking housing.  Current outpatient psychotropic medications include none for many years reported by her and historically she has had a unknown response to these medications. She was not taking Medications for a while/ compliant with medications prior to admission as evidenced by her reporting. On initial examination, patient angry, yelling looking for housing.. Please see plan below for detailed recommendations.   Diagnoses:  Active Hospital problems: Principal Problem:   Major depressive disorder without psychotic features Active Problems:   Anxiety    Plan   ## Psychiatric Medication Recommendations:  NA, refer to outpatient Psychiatrist to establish  care, Offer Shelter Information.  ## Medical Decision Making Capacity: Not specifically addressed in this encounter  ## Further Work-up: -- most recent EKG -NA -- Pertinent labwork reviewed earlier this admission includes: NA   ## Disposition:-- There are no psychiatric contraindications to discharge at this time  ## Behavioral / Environmental: - No specific recommendations at this time.     ## Safety and Observation Level:  - Based on my clinical evaluation, I estimate the patient to be at low risk of self harm in the current setting. - At this time, we recommend  routine. This decision is based on my review of the chart including patient's history and current presentation, interview of the patient, mental status examination, and consideration of suicide risk including evaluating suicidal ideation, plan, intent, suicidal or self-harm behaviors, risk factors, and protective factors. This judgment is based on our ability to directly address suicide risk, implement suicide prevention strategies, and develop a safety plan while the patient is in the clinical setting. Please contact our team if there is a concern that risk level has changed.  CSSR Risk Category:C-SSRS RISK CATEGORY: No Risk  Suicide Risk Assessment: Patient has following modifiable risk factors for suicide: untreated depression, under treated depression , medication noncompliance, and lack of access to outpatient mental health resources, which we are addressing by recommending outpatient Psychiatry and offering list of Shelters . Patient has following non-modifiable or demographic risk factors for suicide: separation or divorce Patient has the following protective factors against suicide: no history of suicide attempts  Thank  you for this consult request. Recommendations have been communicated to the primary team.  We will Psychiatrically clear patient to seek care in outpatient setting. at this time.   Earney Navy,  NP-PMHNP-BC       History of Present Illness  Relevant Aspects of Hospital ED Course:  Admitted on 11/16/2023 for Depression and anxiety in the context of being homeless..   Female, 73 years old, well groomed ands nourished came to the ER for knee pain and knee was drained by EDP.  Physical therapy was recommending and during the assessment patient mentioned to PT staff that she is tired of being homeless and stated that if she had a gun she would kill herself.  Psychiatry was consulted to see patient.    Chart were reviewed and revealed patient was diagnosed back in 2014 with Depression and anxiety  and drug seeking behavior especially Opiates. Patient was seen awake, alert and resting.  She was taken to the conference room for evaluation.  Patient started talking about coming to the ER for her Knee but is homeless.  Patient became angry and started yelling at Provider stating she has used up all Shelters around.  When asked what provider can do for her or the ER physician patient angrily stated she does not know and again stated she is homeless.  Patient reports she is depressed because of homelessness and added she has not been seeing a Psychiatrist, not been on any Psychotherapy  or been taking any Mental health Medication.  Patient states she was staying at [facility] till last night outside the building under the cold weather but cannot do that again.  Patient yelled at provider the entire time.  When asked again what can be done to help her she stated"  I am homeless, I have no where to go"  Provider explained to patient that the ER does not provide housing and that we will offer her resources for shelter.  Patient lacks insight in her mental illness and does not believe that she need to engage in outpatient Psychiatrist.  Patient receives $2, 000 per Month Disability  but refused to use a portion to seek motel housing or apartment.  Patient balked at provider for suggesting that. Female, 24, homeless with  hx of Depression and anxiety not on Medication and does not have outpatient Psychiatrist was seen for homelessness.  Patient denied making statement about using gun to fix everything.  She denies SI/HI/AVH.  Patient adamantly stated "hell no, I am not suicidal but I need a place to stay"  Patient states she has no family members, been married three times and it ended in Divorce.  Patient states she is tired of staying in shelters and need a place to stay.  Provider suggested seeking Mental health treatment at [facility] health Facility.  Patient was given resources for shelter as well.  Patient is Psychiatrically cleared.  Psych ROS:  Depression: yes Anxiety:  yes Mania (lifetime and current): na Psychosis: (lifetime and current): na  Collateral information:  Contacted-None, Patient states she has no friend and no living family  Review of Systems  Reason unable to perform ROS: Angry, refused to engage in meaningful conversation.     Psychiatric and Social History  Psychiatric History:  Information collected from patient/Review of records.  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 Hx: denies Living Situation: homeless for years Spiritual Hx: denies Access to weapons/lethal means: Denies   Substance History Alcohol: denies  Tobacco: denies Illicit drugs: denies Prescription drug abuse: denies Rehab hx: denies  Exam Findings  Physical Exam: Refused to engage in any meaningful conversation Vital Signs:  Temp:  [97.8 F (36.6 C)] 97.8 F (36.6 C) (03/03 1459) Pulse Rate:  [67-68] 68 (03/03 1856) Resp:  [18] 18 (03/03 1856) BP:  (149-155)/(83-91) 149/83 (03/03 1856) SpO2:  [100 %] 100 % (03/03 1856) Weight:  [54 kg] 54 kg (03/03 1515) Blood pressure (!) 149/83, pulse 68, temperature 97.8 F (36.6 C), temperature source Oral, resp. rate 18, height 5\' 8"  (1.727 m), weight 54 kg, SpO2 100%. Body mass index is 18.09 kg/m.  Physical Exam  Mental Status Exam: General Appearance: Casual and Neat  Orientation:  Full (Time, Place, and Person)  Memory:  Immediate;   Good Recent;   Fair Remote;   Fair  Concentration:  Concentration: Fair and Attention Span: Fair  Recall:  Fair  Attention  Fair  Eye Contact:  Good  Speech:  Clear and Coherent and yells occasionally  Language:  Good  Volume:   yelled the entire assessment  Mood: "Depressed, sad due to homelessness"  Affect:  Non-Congruent  Thought Process:  Coherent  Thought Content:  Logical  Suicidal Thoughts:  No  Homicidal Thoughts:  No  Judgement:  Fair  Insight:  Shallow  Psychomotor Activity:  Normal  Akathisia:  NA  Fund of Knowledge:  Fair      Assets:  Manufacturing systems engineer Social Support  Cognition:  WNL  ADL's:  Intact  AIMS (if indicated):        Other History   These have been pulled in through the EMR, reviewed, and updated if appropriate.  Family History:  The patient's family history is not on file.  Medical History: Past Medical History:  Diagnosis Date   Anxiety    Arthritis    back, joints   Asthma    Chronic pain    Constipation    Depression    Dysrhythmia    Palpitations   GERD (gastroesophageal reflux disease)    High cholesterol    Hypertension    resolved whn she stopped drinking alcohol   Mitral prolapse    dx as a child   Osteopenia    Osteoporosis     Surgical History: Past Surgical History:  Procedure Laterality Date   ABDOMINAL HYSTERECTOMY  1985   BACK SURGERY     BREAST SURGERY     augmentation   HARDWARE REMOVAL  02/27/2012   Procedure: HARDWARE REMOVAL;  Surgeon: Kerrin Champagne, MD;  Location: [facility]  OR;  Service: Orthopedics;;  exploration and removal of portion of screw at T-!   JOINT REPLACEMENT Right    shoulder   left hip replacement  02-27-12   just screw - no replacement   NECK SURGERY     plates and screws   ORIF WRIST FRACTURE  1986   right   right femur replacement     ROTATOR CUFF REPAIR     right   SHOULDER HEMI-ARTHROPLASTY  02/27/2012   Procedure: SHOULDER HEMI-ARTHROPLASTY;  Surgeon: Kerrin Champagne, MD;  Location: [facility] OR;  Service: Orthopedics;  Laterality: Right;  Right shoulder hemiarthroplasty, repair right rotator cuff  TONSILLECTOMY     TOTAL KNEE ARTHROPLASTY Left 03/27/2020   TOTAL KNEE ARTHROPLASTY Left 03/27/2020   Procedure: LEFT TOTAL KNEE ARTHROPLASTY;  Surgeon: Cammy Copa, MD;  Location: [facility] OR;  Service: Orthopedics;  Laterality: Left;   WRIST SURGERY     tendon repair from a dog bite- left     Medications:   Current Facility-Administered Medications:    acetaminophen (TYLENOL) tablet 650 mg, 650 mg, Oral, Q6H PRN, Theresia Lo, Victoria K, DO  Current Outpatient Medications:    albuterol (PROVENTIL HFA;VENTOLIN HFA) 108 (90 BASE) MCG/ACT inhaler, Inhale 2 puffs into the lungs every 4 (four) hours as needed for wheezing or shortness of breath., Disp: , Rfl:    buPROPion (WELLBUTRIN XL) 150 MG 24 hr tablet, Take 150 mg by mouth at bedtime., Disp: , Rfl:    diphenhydrAMINE (BENADRYL) 25 mg capsule, Take 75 mg by mouth at bedtime., Disp: , Rfl:    ibuprofen (ADVIL) 600 MG tablet, Take 600 mg by mouth every 6 (six) hours as needed., Disp: , Rfl:    traZODone (DESYREL) 100 MG tablet, Take 200 mg by mouth at bedtime., Disp: , Rfl:    busPIRone (BUSPAR) 5 MG tablet, Take 5 mg by mouth 3 (three) times daily. (Patient not taking: Reported on 11/17/2023), Disp: , Rfl:    hydrOXYzine (ATARAX) 50 MG tablet, Take 50 mg by mouth 2 (two) times daily. (Patient not taking: Reported on 11/17/2023), Disp: , Rfl:    LORazepam (ATIVAN) 1 MG tablet, Take 1 tablet (1 mg  total) by mouth 2 (two) times daily as needed for up to 3 days for anxiety ("Do not take with narcotic medications. Must choose between one or the other."). (Patient not taking: Reported on 11/17/2023), Disp: 6 tablet, Rfl: 0   risperiDONE (RISPERDAL) 1 MG tablet, Take 1 mg by mouth at bedtime. (Patient not taking: Reported on 11/17/2023), Disp: , Rfl:    venlafaxine XR (EFFEXOR-XR) 150 MG 24 hr capsule, Take 150 mg by mouth at bedtime. (Patient not taking: Reported on 11/17/2023), Disp: , Rfl:   Allergies: Allergies  Allergen Reactions   Penicillins Rash and Other (See Comments)    And, from childhood: Had at 73 yrs old and fainted ("with the sight of the large needle.") Has tolerated oral cephalexin and intraoperative cefazolin without issues.      Vancomycin Rash and Other (See Comments)    "I turned red"     Earney Navy, NP-PMHNP-BC

## 2023-11-17 NOTE — Evaluation (Signed)
 Physical Therapy Evaluation Patient Details Name: [name] MRN: [id number] DOB: March 03, 1951 Today's Date: 11/17/2023  History of Present Illness  73 yo female  presents to Ed 11/16/23 with left knee pain after a fall. Noted effusion of the  knee, s/P aspiration.Patient is homeless. ZHY:QMVH, anxiety, asthma , depression, Htn, R shoulder  replacement, neck surgery.  Clinical Impression  Patient reports  left knee pain has diminished. Reports need to ambulate to BR, Patient using Rollator and requires no\ assistance. Patient reports her belongings are at [facility].   No further acute PT needs identified, PT will sign off. Wil have Mobility specialist follow . Patient made statement" If I had a gun, I would fix everything." RN aware.       If plan is discharge home, recommend the following: Assist for transportation;Assistance with cooking/housework   Can travel by private vehicle        Equipment Recommendations None recommended by PT  Recommendations for Other Services       Functional Status Assessment Patient has not had a recent decline in their functional status     Precautions / Restrictions Precautions Precautions: Fall Restrictions Weight Bearing Restrictions Per Provider Order: No      Mobility  Bed Mobility Overal bed mobility: Independent                  Transfers Overall transfer level: Modified independent Equipment used: Rollator (4 wheels)                    Ambulation/Gait Ambulation/Gait assistance: Supervision Gait Distance (Feet): 40 Feet (x 2) Assistive device: Rollator (4 wheels) Gait Pattern/deviations: Step-through pattern, Antalgic   Gait velocity interpretation: 1.31 - 2.62 ft/sec, indicative of limited community ambulator   General Gait Details: L knee flexed  Stairs            Wheelchair Mobility     Tilt Bed    Modified Rankin (Stroke Patients Only)       Balance Overall balance assessment: Mild deficits  observed, not formally tested                                           Pertinent Vitals/Pain Pain Assessment Pain Assessment: No/denies pain    Home Living Family/patient expects to be discharged to:: Shelter/Homeless                   Additional Comments: pt was living at [facility], reports all her belongings are there.    Prior Function Prior Level of Function : Independent/Modified Independent             Mobility Comments: ambulatory with rollator       Extremity/Trunk Assessment   Upper Extremity Assessment Upper Extremity Assessment: Overall WFL for tasks assessed    Lower Extremity Assessment Lower Extremity Assessment: LLE deficits/detail LLE Deficits / Details: kne lacks ~ 20* full ext, patient not willing for PT to fully assess, able to WB on the leg for ambulation.    Cervical / Trunk Assessment Cervical / Trunk Assessment: Kyphotic  Communication   Communication Communication: Impaired Factors Affecting Communication: Hearing impaired    Cognition Arousal: Alert Behavior During Therapy: Anxious, WFL for tasks assessed/performed   PT - Cognitive impairments: No apparent impairments  PT - Cognition Comments: pt stated " If I had a gun, I would fix everything." RN /MD notified of pt statement Following commands: Intact       Cueing Cueing Techniques: Verbal cues     General Comments      Exercises     Assessment/Plan    PT Assessment Patient does not need any further PT services  PT Problem List         PT Treatment Interventions      PT Goals (Current goals can be found in the Care Plan section)  Acute Rehab PT Goals Patient Stated Goal: to find a place to live PT Goal Formulation: All assessment and education complete, DC therapy    Frequency       Co-evaluation               AM-PAC PT "6 Clicks" Mobility  Outcome Measure Help needed turning from your back to your  side while in a flat bed without using bedrails?: None Help needed moving from lying on your back to sitting on the side of a flat bed without using bedrails?: None Help needed moving to and from a bed to a chair (including a wheelchair)?: None Help needed standing up from a chair using your arms (e.g., wheelchair or bedside chair)?: None Help needed to walk in hospital room?: None Help needed climbing 3-5 steps with a railing? : A Little 6 Click Score: 23    End of Session   Activity Tolerance: Patient tolerated treatment well Patient left: in bed;with call bell/phone within reach Nurse Communication: Mobility status (pt statement above) PT Visit Diagnosis: Unsteadiness on feet (R26.81);Difficulty in walking, not elsewhere classified (R26.2)    Time: 4098-1191 PT Time Calculation (min) (ACUTE ONLY): 12 min   Charges:   PT Evaluation $PT Eval Low Complexity: 1 Low   PT General Charges $$ ACUTE PT VISIT: 1 Visit         Blanchard Kelch PT Acute Rehabilitation Services Office [phone]   Rada Hay 11/17/2023, 9:04 AM

## 2023-11-17 NOTE — ED Notes (Signed)
 Pt made aware of her pending discharge, decided not to wait for paperwork and ambulated out of the department without any difficulty. Asked patient if she wanted to wait for her discharge paperwork and patient would not speak with this nurse.

## 2023-11-17 NOTE — ED Provider Notes (Signed)
 Emergency Medicine Observation Re-evaluation Note  [name] is a 73 y.o. female, seen on rounds today.  Pt initially presented to the ED for complaints of Knee Pain Currently, the patient is resting comfortably in ED hallway bed.  Physical Exam  BP (!) 149/83 (BP Location: Left Arm)   Pulse 68   Temp 97.8 F (36.6 C) (Oral)   Resp 18   Ht 5\' 8"  (1.727 m)   Wt 54 kg   SpO2 100%   BMI 18.09 kg/m  Physical Exam General: Awake. Alert. No acute distress Cardiac: Regular rate rhythm Lungs: Clear to auscultation bilaterally Psych: Calm and cooperative  ED Course / MDM  EKG:   I have reviewed the labs performed to date as well as medications administered while in observation.  Recent changes in the last 24 hours include boarding overnight awaiting evaluation from.Case management, PT  Plan  Patient has been evaluated by physical therapy, case management and TTS.  Patient was able to ambulate with her rolling walker during physical therapy evaluation Case management has reviewed the resources available for her for housing.  She is not meeting criteria for nursing home placement at this time .  Patient does have health insurance and case management has provided resources for her She had voiced vagueSuicidal ideation during case management evaluation TTS evaluated her.  Patient adamantly denied SI HI with TTS.  No need for psychiatric Inpatient hospitalization at this time I reevaluated the patient and she continued to deny SI HI Unfortunately we are unable to offer her any additional resources here in the ED at this time She will be discharged with instructions for case management follow-up   Royanne Foots, DO 11/17/23 1033

## 2023-11-17 NOTE — Discharge Instructions (Addendum)
 You were seen in the emergency department for left knee pain You were able to walk with your rolling walker You were evaluated by our case management, physical therapy and psychiatric team You were provided with resources and numbers to call for help with housing and medical care Return to the Emergency Department for severe pain, falls, if you are unable to walk or have any other concerns Also return if you have any thoughts of harming yourself or others

## 2023-11-17 NOTE — ED Notes (Signed)
 Patient ambulated to conference room with rollator without any difficulties or assistance.

## 2023-11-17 NOTE — Progress Notes (Addendum)
 CSW noted consult for SNF. Pt does not qualify for SNF placement due to experiencing homelessness. Per PT, pt is able to ambulate with rollator. RN confirmed that pt has rollator at bedside. PT also expressed concerns about a statement made by pt. CSW staffed case with EDP who has ordered TTS evaluation. TOC will follow for mobility specialist note; otherwise, no additional TOC needs at this time as pt has DME and does not qualify for SNF.

## 2023-11-17 NOTE — ED Notes (Addendum)
 [name] at bedside to examine patient. Taking patient to conference room for evaluation.

## 2023-11-17 NOTE — ED Notes (Signed)
 [facility] provided pt with resources for case management, free meal programs, and shelters in the [place] area.  Jacquelynn Cree, [facility]  11/17/23

## 2023-11-22 ENCOUNTER — Other Ambulatory Visit: Payer: Self-pay

## 2023-11-22 ENCOUNTER — Emergency Department (HOSPITAL_COMMUNITY)
Admission: EM | Admit: 2023-11-22 | Discharge: 2023-11-23 | Disposition: A | Discharge status: Home / Self Care | Attending: Emergency Medicine | Admitting: Emergency Medicine

## 2023-11-22 ENCOUNTER — Emergency Department (HOSPITAL_COMMUNITY)

## 2023-11-22 ENCOUNTER — Encounter (HOSPITAL_COMMUNITY): Payer: Self-pay

## 2023-11-22 DIAGNOSIS — M25562 Pain in left knee: Secondary | ICD-10-CM | POA: Diagnosis present

## 2023-11-22 DIAGNOSIS — S79912A Unspecified injury of left hip, initial encounter: Secondary | ICD-10-CM | POA: Diagnosis present

## 2023-11-22 DIAGNOSIS — M25552 Pain in left hip: Secondary | ICD-10-CM | POA: Insufficient documentation

## 2023-11-22 DIAGNOSIS — S7002XA Contusion of left hip, initial encounter: Secondary | ICD-10-CM | POA: Diagnosis not present

## 2023-11-22 DIAGNOSIS — R4182 Altered mental status, unspecified: Secondary | ICD-10-CM | POA: Diagnosis not present

## 2023-11-22 DIAGNOSIS — W19XXXA Unspecified fall, initial encounter: Secondary | ICD-10-CM | POA: Diagnosis not present

## 2023-11-22 MED ORDER — KETOROLAC TROMETHAMINE 15 MG/ML IJ SOLN
15.0000 mg | Freq: Once | INTRAMUSCULAR | Status: AC
  Administered 2023-11-22: 15 mg via INTRAMUSCULAR
  Filled 2023-11-22: qty 1

## 2023-11-22 NOTE — ED Notes (Signed)
 Patient transported to X-ray

## 2023-11-22 NOTE — ED Provider Notes (Signed)
  EMERGENCY DEPARTMENT AT [facility] Provider Note   CSN: [id number] Arrival date & time: 11/22/23  2112     History  Chief Complaint  Patient presents with   Knee Pain    [name] is a 73 y.o. female.  73 yo F with a cc of left knee and hip pain.  This been going on for couple days.  She suffered a fall she says she went to sit on her rollator and she fell like someone pulled out from under her.  Landed on her left side.  Has been able to ambulate but with some discomfort.  Denies other injury.   Knee Pain      Home Medications Prior to Admission medications   Medication Sig Start Date End Date Taking? Authorizing Provider  albuterol (PROVENTIL HFA;VENTOLIN HFA) 108 (90 BASE) MCG/ACT inhaler Inhale 2 puffs into the lungs every 4 (four) hours as needed for wheezing or shortness of breath.    [provider]  buPROPion (WELLBUTRIN XL) 150 MG 24 hr tablet Take 150 mg by mouth at bedtime.    [provider]  busPIRone (BUSPAR) 5 MG tablet Take 5 mg by mouth 3 (three) times daily. Patient not taking: Reported on 11/17/2023 11/07/23   [provider]  diphenhydrAMINE (BENADRYL) 25 mg capsule Take 75 mg by mouth at bedtime.    [provider]  hydrOXYzine (ATARAX) 50 MG tablet Take 50 mg by mouth 2 (two) times daily. Patient not taking: Reported on 11/17/2023 08/30/23   [provider]  ibuprofen (ADVIL) 600 MG tablet Take 600 mg by mouth every 6 (six) hours as needed. 11/12/23   [provider]  LORazepam (ATIVAN) 1 MG tablet Take 1 tablet (1 mg total) by mouth 2 (two) times daily as needed for up to 3 days for anxiety ("Do not take with narcotic medications. Must choose between one or the other."). Patient not taking: Reported on 11/17/2023 07/19/21 03/31/22  Lorin Glass, MD  risperiDONE (RISPERDAL) 1 MG tablet Take 1 mg by mouth at bedtime. Patient not taking: Reported on 11/17/2023 11/04/23   [provider]  traZODone (DESYREL) 100 MG tablet Take 200 mg by mouth at bedtime. 02/17/22   [provider]  venlafaxine XR (EFFEXOR-XR) 150 MG 24 hr capsule Take 150 mg by mouth at bedtime. Patient not taking: Reported on 11/17/2023    [provider]      Allergies    Penicillins and Vancomycin    Review of Systems   Review of Systems  Physical Exam Updated Vital Signs BP 132/82   Pulse 70   Temp 98.4 F (36.9 C) (Oral)   Resp 18   Ht 5\' 8"  (1.727 m)   Wt 53.9 kg   SpO2 99%   BMI 18.07 kg/m  Physical Exam Vitals and nursing note reviewed.  Constitutional:      General: She is not in acute distress.    Appearance: She is well-developed. She is not diaphoretic.  HENT:     Head: Normocephalic and atraumatic.  Eyes:     Pupils: Pupils are equal, round, and reactive to light.  Cardiovascular:     Rate and Rhythm: Normal rate and regular rhythm.     Heart sounds: No murmur heard.    No friction rub. No gallop.  Pulmonary:     Effort: Pulmonary effort is normal.     Breath sounds: No wheezing or rales.  Abdominal:  General: There is no distension.     Palpations: Abdomen is soft.     Tenderness: There is no abdominal tenderness.  Musculoskeletal:        General: No tenderness.     Cervical back: Normal range of motion and neck supple.     Comments: Pain with range of motion mostly of the left knee with some instability with medial and lateral displacement.  No obvious edema or erythema or warmth.  Pulse motor and sensation intact distally.  No pain with range of motion of the hip.  Skin:    General: Skin is warm and dry.  Neurological:     Mental Status: She is alert and oriented to person, place, and time.  Psychiatric:        Behavior: Behavior normal.     ED Results / Procedures / Treatments   Labs (all labs ordered are listed, but only abnormal results are displayed) Labs Reviewed - No data to display  EKG None  Radiology DG Hip  Unilat W or Wo Pelvis 2-3 Views Left Result Date: 11/22/2023 CLINICAL DATA:  Fall, left hip pain EXAM: DG HIP (WITH OR WITHOUT PELVIS) 2-3V LEFT COMPARISON:  None Available. FINDINGS: Status post right total hip arthroplasty, right proximal femoral ORIF with lateral plate and cerclage wires, incompletely visualized, and left hip ORIF with 2 lag screws. No acute fracture or dislocation. Mild left hip degenerative arthritis. Heterotopic ossification within the gluteal soft tissues bilaterally. IMPRESSION: 1. No acute fracture or dislocation. 2. Postsurgical changes as described above. Electronically Signed   By: Helyn Numbers M.D.   On: 11/22/2023 22:30   DG Knee Complete 4 Views Left Result Date: 11/22/2023 CLINICAL DATA:  Left knee pain, fall EXAM: LEFT KNEE - COMPLETE 4+ VIEW COMPARISON:  11/16/2023 FINDINGS: Status post left knee arthroplasty. There is a linear lucency within the mid body of the patella seen only on lateral examination which appears new from prior examination and may represent an incomplete fracture. No displaced fracture identified. Normal overall alignment. No effusion. Mild prepatellar soft tissue swelling. IMPRESSION: 1. Status post left knee arthroplasty. 2. Linear lucency within the mid body of the patella seen only on lateral examination, possibly reflecting and incomplete fracture. Correlation with point tenderness is recommended. Electronically Signed   By: Helyn Numbers M.D.   On: 11/22/2023 22:28    Procedures Procedures    Medications Ordered in ED Medications  ketorolac (TORADOL) 15 MG/ML injection 15 mg (15 mg Intramuscular Given 11/22/23 2133)    ED Course/ Medical Decision Making/ A&P                                 Medical Decision Making Amount and/or Complexity of Data Reviewed Radiology: ordered.  Risk Prescription drug management.   73 yo F with a chief complaints of left knee and hip pain.  Patient had suffered what sounds like a nonsyncopal fall  couple days ago.  Will obtain plain films here.  Plain film on my independent interpretation without obvious fracture.  Radiology read with possible patellar fracture.  Diffuse pain on my exam.  I think unlikely to be fractured.  Will place in her knee sleeve for comfort.  Orthopedic follow-up.  Plain film of the left hip on my independent abrasion without fracture or dislocation.  11:01 PM:  I have discussed the diagnosis/risks/treatment options with the patient.  Evaluation and diagnostic testing in the emergency  department does not suggest an emergent condition requiring admission or immediate intervention beyond what has been performed at this time.  They will follow up with PCP. We also discussed returning to the ED immediately if new or worsening sx occur. We discussed the sx which are most concerning (e.g., sudden worsening pain, fever, inability to tolerate by mouth) that necessitate immediate return. Medications administered to the patient during their visit and any new prescriptions provided to the patient are listed below.  Medications given during this visit Medications  ketorolac (TORADOL) 15 MG/ML injection 15 mg (15 mg Intramuscular Given 11/22/23 2133)     The patient appears reasonably screen and/or stabilized for discharge and I doubt any other medical condition or other [facility] requiring further screening, evaluation, or treatment in the ED at this time prior to discharge.          Final Clinical Impression(s) / ED Diagnoses Final diagnoses:  Acute pain of left knee    Rx / DC Orders ED Discharge Orders     None         Melene Plan, DO 11/22/23 2301

## 2023-11-22 NOTE — ED Triage Notes (Signed)
 BIB EMS from [facility]. Pt fell 3 days ago and hurt left knee and left hip, EMS reports swelling, no bruising noted. No other injury after fall, no head injury or LOC, no blood thinners.

## 2023-11-22 NOTE — ED Notes (Addendum)
 Pt requesting to talk to a Child psychotherapist. Pt is homeless and is requesting help and resources.

## 2023-11-22 NOTE — Discharge Instructions (Addendum)
 Follow-up with your orthopedic doctor in the office.  Take 4 over the counter ibuprofen tablets 3 times a day or 2 over-the-counter naproxen tablets twice a day for pain. Also take tylenol 1000mg (2 extra strength) four times a day.

## 2023-11-23 ENCOUNTER — Emergency Department (HOSPITAL_COMMUNITY)

## 2023-11-23 ENCOUNTER — Emergency Department (HOSPITAL_COMMUNITY): Admission: EM | Admit: 2023-11-23 | Discharge: 2023-11-24 | Disposition: A | Discharge status: Home / Self Care

## 2023-11-23 DIAGNOSIS — S7002XA Contusion of left hip, initial encounter: Secondary | ICD-10-CM | POA: Insufficient documentation

## 2023-11-23 DIAGNOSIS — M25562 Pain in left knee: Secondary | ICD-10-CM | POA: Insufficient documentation

## 2023-11-23 DIAGNOSIS — R4182 Altered mental status, unspecified: Secondary | ICD-10-CM | POA: Insufficient documentation

## 2023-11-23 DIAGNOSIS — W19XXXA Unspecified fall, initial encounter: Secondary | ICD-10-CM | POA: Insufficient documentation

## 2023-11-23 LAB — CBC
HCT: 35.5 % — ABNORMAL LOW (ref 36.0–46.0)
Hemoglobin: 11.6 g/dL — ABNORMAL LOW (ref 12.0–15.0)
MCH: 30.4 pg (ref 26.0–34.0)
MCHC: 32.7 g/dL (ref 30.0–36.0)
MCV: 93.2 fL (ref 80.0–100.0)
Platelets: 386 10*3/uL (ref 150–400)
RBC: 3.81 MIL/uL — ABNORMAL LOW (ref 3.87–5.11)
RDW: 15.4 % (ref 11.5–15.5)
WBC: 5.8 10*3/uL (ref 4.0–10.5)
nRBC: 0 % (ref 0.0–0.2)

## 2023-11-23 LAB — COMPREHENSIVE METABOLIC PANEL
ALT: 13 U/L (ref 0–44)
AST: 23 U/L (ref 15–41)
Albumin: 4.1 g/dL (ref 3.5–5.0)
Alkaline Phosphatase: 47 U/L (ref 38–126)
Anion gap: 10 (ref 5–15)
BUN: 26 mg/dL — ABNORMAL HIGH (ref 8–23)
CO2: 22 mmol/L (ref 22–32)
Calcium: 9.2 mg/dL (ref 8.9–10.3)
Chloride: 109 mmol/L (ref 98–111)
Creatinine, Ser: 0.7 mg/dL (ref 0.44–1.00)
GFR, Estimated: >60 mL/min (ref >60)
Glucose, Bld: 96 mg/dL (ref 70–99)
Potassium: 4.2 mmol/L (ref 3.5–5.1)
Sodium: 141 mmol/L (ref 135–145)
Total Bilirubin: 0.6 mg/dL (ref 0.0–1.2)
Total Protein: 6.9 g/dL (ref 6.5–8.1)

## 2023-11-23 MED ORDER — KETOROLAC TROMETHAMINE 15 MG/ML IJ SOLN
15.0000 mg | Freq: Once | INTRAMUSCULAR | Status: AC
  Administered 2023-11-23: 15 mg via INTRAVENOUS
  Filled 2023-11-23: qty 1

## 2023-11-23 MED ORDER — KETOROLAC TROMETHAMINE 15 MG/ML IJ SOLN
15.0000 mg | Freq: Once | INTRAMUSCULAR | Status: AC
  Administered 2023-11-23: 15 mg via INTRAMUSCULAR
  Filled 2023-11-23: qty 1

## 2023-11-23 NOTE — ED Provider Triage Note (Signed)
 Emergency Medicine Provider Triage Evaluation Note  [name] , a 73 y.o. female  was evaluated in triage.  Pt complains of three falls at home. Reports she went to ED yesterday for knee pain. Larey Seat again this AM and now she has left hip pain. Doesn't know why she fell, states she got dizzy and lost her balance. No room-spinning sensation. Has "barely" been able to walk since she fell this morning. Uses a rollator to get around.   Review of Systems  Positive: Fall, left hip pain. Negative: Numbness/tingling, head strike, LOC. F/c, flu-like symptoms. CP, SOB. Urinary sxs.   Physical Exam  BP (!) 149/103   Pulse 66   Temp 98.4 F (36.9 C)   Resp 16   SpO2 100%  Gen:   Awake, no distress   Resp:  Normal effort  MSK:   Moves extremities without difficulty  Other:  Left hip/knee pain  Medical Decision Making  Medically screening exam initiated at 4:53 PM.  Appropriate orders placed.  [name] was informed that the remainder of the evaluation will be completed by another provider, this initial triage assessment does not replace that evaluation, and the importance of remaining in the ED until their evaluation is complete.  Patient is HDS and appropriate labs/imaging have been ordered.    Annette Rough, MD 11/23/23 [phone]

## 2023-11-23 NOTE — ED Notes (Signed)
 Unable to obtain labwork or IV. Phlebotomy attempted lab collection with no success.

## 2023-11-23 NOTE — ED Notes (Signed)
 Pt assisted to bathroom. Pt unable to ambulate due to knee pain/weakness. Pt assisted to bathroom and back to bed via wheelchair.

## 2023-11-23 NOTE — ED Provider Notes (Signed)
  EMERGENCY DEPARTMENT AT [facility] Provider Note   CSN: [id number] Arrival date & time: 11/23/23  1513     History {Add pertinent medical, surgical, social history, OB history to HPI:1} Chief Complaint  Patient presents with   Fall   Altered Mental Status    [name] is a 73 y.o. female.  73 year old female with past medical history of homelessness and osteoporosis presenting to the emergency department today after a fall.  The patient apparently had a fall today.  She states she has had multiple falls over the past few days.  She states that these are mechanical falls due to her "leg giving out".  The patient states that today she is having pain in her left knee and left hip since she fell.  She was seen here in the emergency department yesterday after a fall.  She denies any headache.  Denies any syncope or presyncope.   Fall  Altered Mental Status      Home Medications Prior to Admission medications   Medication Sig Start Date End Date Taking? Authorizing Provider  albuterol (PROVENTIL HFA;VENTOLIN HFA) 108 (90 BASE) MCG/ACT inhaler Inhale 2 puffs into the lungs every 4 (four) hours as needed for wheezing or shortness of breath.    [provider]  buPROPion (WELLBUTRIN XL) 150 MG 24 hr tablet Take 150 mg by mouth at bedtime.    [provider]  busPIRone (BUSPAR) 5 MG tablet Take 5 mg by mouth 3 (three) times daily. Patient not taking: Reported on 11/17/2023 11/07/23   [provider]  diphenhydrAMINE (BENADRYL) 25 mg capsule Take 75 mg by mouth at bedtime.    [provider]  hydrOXYzine (ATARAX) 50 MG tablet Take 50 mg by mouth 2 (two) times daily. Patient not taking: Reported on 11/17/2023 08/30/23   [provider]  ibuprofen (ADVIL) 600 MG tablet Take 600 mg by mouth every 6 (six) hours as needed. 11/12/23   [provider]  LORazepam (ATIVAN) 1 MG tablet Take 1 tablet (1 mg total) by mouth 2  (two) times daily as needed for up to 3 days for anxiety ("Do not take with narcotic medications. Must choose between one or the other."). Patient not taking: Reported on 11/17/2023 07/19/21 03/31/22  Lorin Glass, MD  risperiDONE (RISPERDAL) 1 MG tablet Take 1 mg by mouth at bedtime. Patient not taking: Reported on 11/17/2023 11/04/23   [provider]  traZODone (DESYREL) 100 MG tablet Take 200 mg by mouth at bedtime. 02/17/22   [provider]  venlafaxine XR (EFFEXOR-XR) 150 MG 24 hr capsule Take 150 mg by mouth at bedtime. Patient not taking: Reported on 11/17/2023    [provider]      Allergies    Penicillins and Vancomycin    Review of Systems   Review of Systems  Musculoskeletal:  Positive for arthralgias.  All other systems reviewed and are negative.   Physical Exam Updated Vital Signs BP (!) 182/75 (BP Location: Right Arm)   Pulse (!) 56   Temp 98 F (36.7 C)   Resp (!) 22   SpO2 100%  Physical Exam Vitals and nursing note reviewed.   Gen: NAD Eyes: PERRL, EOMI HEENT: no oropharyngeal swelling Neck: trachea midline, no midline tenderness Resp: clear to auscultation bilaterally Card: RRR, no murmurs, rubs, or gallops Abd: nontender, nondistended Extremities: Knee immobilizer in place, there is no significant redness or swelling of the left knee noted with no gross deformity, the  patient is tender over the proximal left hip with no acute deformity noted, the remainder of the extremities are atraumatic Vascular: 2+ radial pulses bilaterally, 2+ DP pulses bilaterally Skin: no rashes Psyc: acting appropriately   ED Results / Procedures / Treatments   Labs (all labs ordered are listed, but only abnormal results are displayed) Labs Reviewed  COMPREHENSIVE METABOLIC PANEL - Abnormal; Notable for the following components:      Result Value   BUN 26 (*)    All other components within normal limits  CBC - Abnormal; Notable for the following  components:   RBC 3.81 (*)    Hemoglobin 11.6 (*)    HCT 35.5 (*)    All other components within normal limits  URINALYSIS, ROUTINE W REFLEX MICROSCOPIC  CBG MONITORING, ED    EKG None  Radiology DG Hip Unilat W or Wo Pelvis 2-3 Views Left Result Date: 11/23/2023 CLINICAL DATA:  Fall with left hip pain. EXAM: DG HIP (WITH OR WITHOUT PELVIS) 2-3V LEFT COMPARISON:  Left hip x-ray 11/22/2023 FINDINGS: There is no acute fracture or dislocation identified. Two left-sided hip screws are unchanged in position. Right hip arthroplasty appears unchanged. The bones are osteopenic. IMPRESSION: 1. No acute fracture or dislocation identified. 2. Osteopenia. Electronically Signed   By: Darliss Cheney M.D.   On: 11/23/2023 20:15   DG Knee 2 Views Left Result Date: 11/23/2023 CLINICAL DATA:  Fall and trauma to the left knee.  Pain. EXAM: LEFT KNEE - 1-2 VIEW COMPARISON:  None Available. FINDINGS: There is a total left knee arthroplasty. The arthroplasty components appear intact and in anatomic alignment. There is no acute fracture or dislocation. The bones are osteopenic. A small suprapatellar effusion suspected. The soft tissues are unremarkable. IMPRESSION: 1. No acute fracture or dislocation. 2. Total left knee arthroplasty. Electronically Signed   By: Elgie Collard M.D.   On: 11/23/2023 19:07   DG Hip Unilat W or Wo Pelvis 2-3 Views Left Result Date: 11/22/2023 CLINICAL DATA:  Fall, left hip pain EXAM: DG HIP (WITH OR WITHOUT PELVIS) 2-3V LEFT COMPARISON:  None Available. FINDINGS: Status post right total hip arthroplasty, right proximal femoral ORIF with lateral plate and cerclage wires, incompletely visualized, and left hip ORIF with 2 lag screws. No acute fracture or dislocation. Mild left hip degenerative arthritis. Heterotopic ossification within the gluteal soft tissues bilaterally. IMPRESSION: 1. No acute fracture or dislocation. 2. Postsurgical changes as described above. Electronically Signed   By:  Helyn Numbers M.D.   On: 11/22/2023 22:30   DG Knee Complete 4 Views Left Result Date: 11/22/2023 CLINICAL DATA:  Left knee pain, fall EXAM: LEFT KNEE - COMPLETE 4+ VIEW COMPARISON:  11/16/2023 FINDINGS: Status post left knee arthroplasty. There is a linear lucency within the mid body of the patella seen only on lateral examination which appears new from prior examination and may represent an incomplete fracture. No displaced fracture identified. Normal overall alignment. No effusion. Mild prepatellar soft tissue swelling. IMPRESSION: 1. Status post left knee arthroplasty. 2. Linear lucency within the mid body of the patella seen only on lateral examination, possibly reflecting and incomplete fracture. Correlation with point tenderness is recommended. Electronically Signed   By: Helyn Numbers M.D.   On: 11/22/2023 22:28    Procedures Procedures  {Document cardiac monitor, telemetry assessment procedure when appropriate:1}  Medications Ordered in ED Medications  ketorolac (TORADOL) 15 MG/ML injection 15 mg (has no administration in time range)    ED Course/ Medical Decision Making/  A&P   {   Click here for ABCD2, HEART and other calculatorsREFRESH Note before signing :1}                              Medical Decision Making 73 year old female with past medical history of osteoporosis and homelessness presenting to the emergency department today after a fall.  She has fallen multiple times over the past few days.  I will further evaluate her here with a CT scan of her head as well as the x-rays of her left hip and left knee for further evaluation for acute traumatic injuries.  Give patient Toradol for pain.  Seems that her homelessness is likely playing a large part in her repeat presentations here.  Amount and/or Complexity of Data Reviewed Radiology: ordered.  Risk Prescription drug management.   ***  {Document critical care time when appropriate:1} {Document review of labs and  clinical decision tools ie heart score, Chads2Vasc2 etc:1}  {Document your independent review of radiology images, and any outside records:1} {Document your discussion with family members, caretakers, and with consultants:1} {Document social determinants of health affecting pt's care:1} {Document your decision making why or why not admission, treatments were needed:1} Final Clinical Impression(s) / ED Diagnoses Final diagnoses:  None    Rx / DC Orders ED Discharge Orders     None

## 2023-11-23 NOTE — ED Triage Notes (Signed)
 Pt BIB EMS from a parking lot, pt reports she had a fall outside, reports 3 falls last week. Denies LOC, not on thinners. Pt is A&Ox3, disoriented to time. Reports she left [facility] yesterday and she was not supposed to. C/o L knee pain.

## 2023-11-23 NOTE — ED Notes (Signed)
 Pt still having pain in knee and difficulty bearing any weight, even after brace. Pt still requesting to speak to Child psychotherapist as well. Charge RN notified of pt status, and charge approved to let pt remain as a pending discharge in the hallway until further notice.

## 2023-11-23 NOTE — Progress Notes (Signed)
 IVT consult placed requesting IV start. As of 1850 pt does not having any medications or studies/test requiring IV access and no "obtain SL" order. Primary Rn [place] and aware.

## 2023-11-23 NOTE — Progress Notes (Addendum)
 TOC consulted for shelter resources. Resources attached to AVS. Pt did also receive resources on previous visit. CSW also attached social services resources. No additional TOC needs. RN notified.   Addend @ 9:00AM CSW spoke with pt at bedside who appeared upset because her belongings are at [employer]. CSW spoke with Cordelia Pen, front desk rep at [employer] who reports pt can return at 7:30 PM and will be brought in by a CM for dinner and a place to stay. Cordelia Pen offered to transfer this Clinical research associate to Hot[facility] to be contacted once a bed becomes available but pt states she only had 6% on her phone. She states she does not have any family support. Pt was provided taxi voucher to [facility] and bus passes [facility].

## 2023-11-24 MED ORDER — NAPROXEN 500 MG PO TABS: 500.0000 mg | ORAL_TABLET | Freq: Two times a day (BID) | ORAL | 0 refills | Status: AC

## 2023-11-24 NOTE — Discharge Instructions (Signed)
 Your workup today was read during.  Please take the naproxen as needed for pain and follow-up with your doctor.  Return to the ER for worsening symptoms.

## 2023-11-24 NOTE — ED Notes (Signed)
 Pt was verbally aggressive during discharge, she was walking around the room with no assistance.

## 2023-11-25 NOTE — Congregational Nurse Program (Signed)
 Client to RN office she recently lost housing and is now street housed. She has been seen in ED for frequent falls recently. She is requesting ALF housing. RN called her provider [facility] and scheduled an appointment for client on March 26th, 2025 at 1:40 pm and transportation pick up is in front of [facility] building at 1:20 pm. She needs a FL2 for ALF before RN can call to coordinate care.  Client and RN concerned what she will do until then. Notes from ED state she would be appropriate for higher level of care however do to her status as an individual experiencing housing she was automatically disqualified for SNF, without attempts to see if client would qualify for assistance and given resources to go to shelters. RN called [facility] and spoke to charge RN to see if they had CSW on site so that RN could send client and advocate for her and unfortunately they would have to refer out. RN then called director of TOC at [facility] and they recommended RN come to hospital with client to advocate for her. RN unable to do that due to scheduling. RN then contacted [facility] ED and spoke to charge RN to advocate for client about situation before sending her to hospital because our fear is she will be dismissed due to her status as an individual experiencing homelessness, it was recommended by charge RN to send client to ED in order to be seen by provider in order to get FL2 faster and CSW to assist.  Plan given to client to go to hospital and she is upset because she feels she has not been cared for well by hospital and is afraid she is going to get dismissed with receiving help. She does not want to go to hospital today but she states she will tomorrow.

## 2023-11-28 ENCOUNTER — Other Ambulatory Visit: Payer: Self-pay

## 2023-11-28 ENCOUNTER — Emergency Department (HOSPITAL_COMMUNITY)
Admission: EM | Admit: 2023-11-28 | Discharge: 2023-11-28 | Disposition: A | Discharge status: Home / Self Care | Attending: Emergency Medicine | Admitting: Emergency Medicine

## 2023-11-28 ENCOUNTER — Emergency Department (HOSPITAL_COMMUNITY)

## 2023-11-28 DIAGNOSIS — I10 Essential (primary) hypertension: Secondary | ICD-10-CM | POA: Diagnosis not present

## 2023-11-28 DIAGNOSIS — R21 Rash and other nonspecific skin eruption: Secondary | ICD-10-CM | POA: Insufficient documentation

## 2023-11-28 DIAGNOSIS — M25562 Pain in left knee: Secondary | ICD-10-CM | POA: Diagnosis present

## 2023-11-28 DIAGNOSIS — G8929 Other chronic pain: Secondary | ICD-10-CM | POA: Insufficient documentation

## 2023-11-28 DIAGNOSIS — J45909 Unspecified asthma, uncomplicated: Secondary | ICD-10-CM | POA: Insufficient documentation

## 2023-11-28 MED ORDER — MUPIROCIN CALCIUM 2 % EX CREA
TOPICAL_CREAM | Freq: Once | CUTANEOUS | Status: AC
  Filled 2023-11-28: qty 15

## 2023-11-28 MED ORDER — ACETAMINOPHEN 325 MG PO TABS
650.0000 mg | ORAL_TABLET | Freq: Once | ORAL | Status: AC
  Administered 2023-11-28: 650 mg via ORAL
  Filled 2023-11-28: qty 2

## 2023-11-28 MED ORDER — MUPIROCIN 2 % EX OINT: 1.0000 | TOPICAL_OINTMENT | Freq: Two times a day (BID) | CUTANEOUS | 0 refills | Status: AC

## 2023-11-28 MED ORDER — KETOROLAC TROMETHAMINE 15 MG/ML IJ SOLN: 15.0000 mg | Freq: Once | INTRAMUSCULAR | Status: DC

## 2023-11-28 NOTE — ED Triage Notes (Signed)
 Pt BIB [facility] from home c/o left knee pain x 1 week. Also concerned for scab/rash to left arm x 2 weeks. No fevers/ chills.

## 2023-11-28 NOTE — ED Notes (Signed)
 RN reviewed shelters and crisis number with pt. Pt states she doesn't know what the hospital want [place] to do with it.   RN informed these are resources to help pt.  Pt is frustrated and states she doesn't feel she is getting help.

## 2023-11-28 NOTE — ED Notes (Signed)
 Pt provided medication.  Pt states she cannot leave d/t homelessness and u/t walk.  Pt and PA informed pt scans were clear and information has been provided to assist with her situation.   Pt upset that form list diagnoses she doesn't agree with. Pt informed the ER is u/t to make adjustments to diagnoses on her chart. The paperwork is needed to have placement in a facility. Pt states she doesn't understand how she is being discharge.   Pt was provided Tylenol and educated she will manage her pain with Tylenol and Ibuprofen. Pt can be given a bus pass to communicate with the driver on which bus to take. Pt doesn't agree and tearful at this time.  RN notified security to assist with pt to being escorted to lobby.

## 2023-11-28 NOTE — [place] FL2 (Signed)
 [place] MEDICAID FL2 LEVEL OF CARE FORM     IDENTIFICATION  Patient Name: [name] Birthdate: 1950-11-30 Sex: female Admission Date (Current Location): 11/28/2023  [facility] and [place] Number:  Producer, television/film/video and Address:  [facility] [place], [address] [place], [place] [address]      Provider Number: [id number]  Attending Physician Name and Address:  Kristine Royal, MD  Relative Name and Phone Number:       Current Level of Care: Hospital Recommended Level of Care: Assisted Living Facility Prior Approval Number:    Date Approved/Denied:   PASRR Number: [id number]  Discharge Plan: Home    Current Diagnoses: Patient Active Problem List   Diagnosis Date Noted   Hemothorax 07/09/2021   Acute respiratory failure with hypoxia (HCC) 07/09/2021   Acute posthemorrhagic anemia 07/09/2021   Compression fracture of T10 vertebra (HCC) 07/09/2021   Cognitive impairment 07/09/2021   Anxiety 07/09/2021   Arthritis of left knee 03/27/2020   Humeral surgical neck fracture 02/27/2012   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/27/2012   Pulmonary nodule 02/27/2012   Drug-seeking behavior 02/25/2011   ROTATOR CUFF REPAIR, RIGHT, HX OF 03/27/2010   HYPERLIPIDEMIA 02/21/2010   NECK PAIN, CHRONIC 11/17/2007   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   Osteoporosis 11/11/2007    Orientation RESPIRATION BLADDER Height & Weight     Self, Time, Situation, Place  Normal Continent Weight:  118lbs Height:  5'8   BEHAVIORAL SYMPTOMS/MOOD NEUROLOGICAL BOWEL NUTRITION STATUS      Continent Diet (Regular)  AMBULATORY STATUS COMMUNICATION OF NEEDS Skin   Limited Assist Verbally Normal                       Personal Care Assistance Level of Assistance  Bathing, Feeding, Dressing Bathing Assistance: Limited assistance Feeding assistance: Independent Dressing Assistance: Limited assistance     Functional Limitations Info  Sight,  Hearing, Speech Sight Info: Adequate Hearing Info: Adequate Speech Info: Adequate    SPECIAL CARE FACTORS FREQUENCY                       Contractures Contractures Info: Not present    Additional Factors Info  Code Status, Allergies Code Status Info: Full Allergies Info: Penicllins, Vancomycin           Current Medications (11/28/2023):  This is the current hospital active medication list No current facility-administered medications for this encounter.   Current Outpatient Medications  Medication Sig Dispense Refill   albuterol (PROVENTIL HFA;VENTOLIN HFA) 108 (90 BASE) MCG/ACT inhaler Inhale 2 puffs into the lungs every 4 (four) hours as needed for wheezing or shortness of breath.     buPROPion (WELLBUTRIN XL) 150 MG 24 hr tablet Take 150 mg by mouth at bedtime.     busPIRone (BUSPAR) 5 MG tablet Take 5 mg by mouth 3 (three) times daily. (Patient not taking: Reported on 11/17/2023)     diphenhydrAMINE (BENADRYL) 25 mg capsule Take 75 mg by mouth at bedtime.     hydrOXYzine (ATARAX) 50 MG tablet Take 50 mg by mouth 2 (two) times daily. (Patient not taking: Reported on 11/17/2023)     ibuprofen (ADVIL) 600 MG tablet Take 600 mg by mouth every 6 (six) hours as needed.     LORazepam (ATIVAN) 1 MG tablet Take 1 tablet (1 mg total) by mouth 2 (two) times daily as needed for up to 3 days for anxiety ("Do  not take with narcotic medications. Must choose between one or the other."). (Patient not taking: Reported on 11/17/2023) 6 tablet 0   naproxen (NAPROSYN) 500 MG tablet Take 1 tablet (500 mg total) by mouth 2 (two) times daily. 30 tablet 0   risperiDONE (RISPERDAL) 1 MG tablet Take 1 mg by mouth at bedtime. (Patient not taking: Reported on 11/17/2023)     traZODone (DESYREL) 100 MG tablet Take 200 mg by mouth at bedtime.     venlafaxine XR (EFFEXOR-XR) 150 MG 24 hr capsule Take 150 mg by mouth at bedtime. (Patient not taking: Reported on 11/17/2023)       Discharge Medications: Please  see discharge summary for a list of discharge medications.  Relevant Imaging Results:  Relevant Lab Results:   Additional Information SSN: [id number]  Susa Simmonds, LCSWA

## 2023-11-28 NOTE — ED Notes (Signed)
Pt refuse vitals at this time

## 2023-11-28 NOTE — Progress Notes (Signed)
 FL2 completed as requested for ALF placement. Additional homeless resources added to patients AVS. CSW spoke with patient and provided a copy of the FL2 to her.

## 2023-11-28 NOTE — Discharge Instructions (Addendum)
 It was a pleasure caring for you today.  Please follow-up with your primary care provider.  Seek emergency care experiencing any new or worsening symptoms.  Alternating between 650 mg Tylenol and 400 mg Advil: The best way to alternate taking Acetaminophen (example Tylenol) and Ibuprofen (example Advil/Motrin) is to take them 3 hours apart. For example, if you take ibuprofen at 6 am you can then take Tylenol at 9 am. You can continue this regimen throughout the day, making sure you do not exceed the recommended maximum dose for each drug.

## 2023-11-28 NOTE — ED Provider Notes (Signed)
 [place] EMERGENCY DEPARTMENT AT [facility] Provider Note   CSN: [id number] Arrival date & time: 11/28/23  0631     History  Chief Complaint  Patient presents with   Knee Pain    L    [name] is a 73 y.o. female with PMHx anxiety, arthritis, asthma, chronic left knee pain, depression, HLD, HTN who presents to ED concerned for left knee pain worsening over 2 weeks. Patient stating that she had 3 falls last week d/t the knee pain, but denies pain anywhere else and does not think that there are any broken bones. Patient has not taken any pain medication recently and is requesting some here today.  Of note, patient also concerned for new rash to left arm x2 weeks. Denies new medications, soaps, detergents, lotions.    Knee Pain      Home Medications Prior to Admission medications   Medication Sig Start Date End Date Taking? Authorizing Provider  albuterol (PROVENTIL HFA;VENTOLIN HFA) 108 (90 BASE) MCG/ACT inhaler Inhale 2 puffs into the lungs every 4 (four) hours as needed for wheezing or shortness of breath.    [provider]  buPROPion (WELLBUTRIN XL) 150 MG 24 hr tablet Take 150 mg by mouth at bedtime.    [provider]  busPIRone (BUSPAR) 5 MG tablet Take 5 mg by mouth 3 (three) times daily. Patient not taking: Reported on 11/17/2023 11/07/23   [provider]  diphenhydrAMINE (BENADRYL) 25 mg capsule Take 75 mg by mouth at bedtime.    [provider]  hydrOXYzine (ATARAX) 50 MG tablet Take 50 mg by mouth 2 (two) times daily. Patient not taking: Reported on 11/17/2023 08/30/23   [provider]  ibuprofen (ADVIL) 600 MG tablet Take 600 mg by mouth every 6 (six) hours as needed. 11/12/23   [provider]  LORazepam (ATIVAN) 1 MG tablet Take 1 tablet (1 mg total) by mouth 2 (two) times daily as needed for up to 3 days for anxiety ("Do not take with narcotic medications. Must choose between one or the  other."). Patient not taking: Reported on 11/17/2023 07/19/21 03/31/22  Lorin Glass, MD  naproxen (NAPROSYN) 500 MG tablet Take 1 tablet (500 mg total) by mouth 2 (two) times daily. 11/24/23   Durwin Glaze, MD  risperiDONE (RISPERDAL) 1 MG tablet Take 1 mg by mouth at bedtime. Patient not taking: Reported on 11/17/2023 11/04/23   [provider]  traZODone (DESYREL) 100 MG tablet Take 200 mg by mouth at bedtime. 02/17/22   [provider]  venlafaxine XR (EFFEXOR-XR) 150 MG 24 hr capsule Take 150 mg by mouth at bedtime. Patient not taking: Reported on 11/17/2023    [provider]      Allergies    Penicillins and Vancomycin    Review of Systems   Review of Systems  Musculoskeletal:        Knee pain    Physical Exam Updated Vital Signs BP (!) 113/98 (BP Location: Right Arm)   Pulse (!) 59   Temp 98.3 F (36.8 C) (Oral)   Resp 19   SpO2 99%  Physical Exam Vitals and nursing note reviewed.  Constitutional:      General: She is not in acute distress.    Appearance: She is not ill-appearing or toxic-appearing.  HENT:     Head: Normocephalic and atraumatic.  Eyes:     General: No scleral icterus.       Right eye: No discharge.  Left eye: No discharge.     Conjunctiva/sclera: Conjunctivae normal.  Cardiovascular:     Rate and Rhythm: Normal rate.  Pulmonary:     Effort: Pulmonary effort is normal.  Abdominal:     General: Abdomen is flat.  Musculoskeletal:     Comments: Mild swelling of left knee without erythema or increased warmth. +2 pedal pulses. Passive ROM intact but painful. No tenderness to palpation of left knee.   Skin:    General: Skin is warm and dry.     Comments: <1cm scattered scabbing on posterior side of left forearm. There is also <1cm area of scabbing and yellow crusting just underneath lower lip. No spreading erythema of these lesions. No swelling or purulence. Negative nikolsky sign.  Neurological:     General: No focal deficit  present.     Mental Status: She is alert and oriented to person, place, and time. Mental status is at baseline.  Psychiatric:        Mood and Affect: Mood normal.        Behavior: Behavior normal.     ED Results / Procedures / Treatments   Labs (all labs ordered are listed, but only abnormal results are displayed) Labs Reviewed - No data to display  EKG None  Radiology DG Knee Complete 4 Views Left Result Date: 11/28/2023 CLINICAL DATA:  Left knee pain.  Swelling. EXAM: LEFT KNEE - COMPLETE 4+ VIEW COMPARISON:  11/23/2023. FINDINGS: There is diffuse osteopenia of the visualized osseous structures. No acute fracture or dislocation. No aggressive osseous lesion. 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 The hardware is intact. No periprosthetic fracture or lucency. No interval change in alignment, when compared to the available recent prior examination. No knee effusion or focal soft tissue swelling. No radiopaque foreign bodies. IMPRESSION: No acute osseous abnormality of the left knee. Electronically Signed   By: Jules Schick M.D.   On: 11/28/2023 09:13    Procedures Procedures    Medications Ordered in ED Medications  mupirocin cream (BACTROBAN) 2 % (has no administration in time range)  acetaminophen (TYLENOL) tablet 650 mg (has no administration in time range)    ED Course/ Medical Decision Making/ A&P                                 Medical Decision Making  This patient presents to the ED for concern of knee pain and rash, this involves an extensive number of treatment options, and is a complaint that carries with it a high risk of complications and morbidity.  The differential diagnosis includes irritant contact dermatitis, DRESS, atopic dermatitis, anaphylaxis, SJS/TEN, hemarthrosis, gout, septic arthritis, fracture, compartment syndrome   Co morbidities that complicate the patient evaluation  anxiety, arthritis, asthma, chronic left  knee pain, depression, HLD, HTN    Additional history obtained:  Patient with 3 other visits in the past 12 days also complaining of left knee pain 11/25/2023 note by patient's carb ratio nurse.  Requesting patient to go to the ED to be seen by provider in order to get FL2 and CSW to assist with long-term housing.   Imaging Studies ordered:  I ordered imaging studies including knee xray  I independently visualized and interpreted imaging which showed no acute abnormality I agree with the radiologist interpretation   Problem List / ED Course / Critical interventions / Medication management  Patient presents to ED concern for her chronic  left knee pain.  Patient stating that she fell 3 times last week because of the pain -but denies pain anywhere else and does not think that anything is broken.  Patient denying any other infectious symptoms today.  Physical exam reassuring.  Patient afebrile with stable vitals. Patient has not taken any pain medication for her knee pain.  Provided patient with Tylenol. Patient also concerned for rash.  Physical exam with small scattered areas of scabbing on the left forearm and a small area of scabbing and yellow crusting just underneath lower lip.  Patient picking at scabs. Possible bug bites versus impetigo.  Provided patient with mupirocin.  Educated patient to apply cream and follow-up with PCP. Per chart review, patient's navigational nurse requesting the patient goes to the ED for FL2.  I sent a secure chat to CSW here at [facility] who was able to obtain [facility] - Dr. Rodena Medin signed. Patient provided with copy.  Patient was seen by physical therapy last week who saw that patient ambulated well with her walker and no other assistance and signed off at that time. Shared xray results with patient. Answered all questions. Patient ready for discharge. Staffed with Dr. Rodena Medin.  I have reviewed the patients home medicines and have made adjustments as needed Patient  afebrile with stable vitals.  Provided with return precautions.  Discharged in good condition.  Ddx: these are considered less likely due to history of present illness and physical exam - irritant contact dermatitis: patient without irritant exposure, patient playful on exam -DRESS: patient afebrile, stable vitals -atopic dermatitis: rash diffuse, not confined to flexural areas -SJS/TEN: negative Nikolsky sign -[place] Spotted Fever/Lyme Disease: no hx tick bite or fever -hemarthrosis: joint without swelling; ROM intact -gout: no warmth or erythema; ROM intact  -septic joint: afebrile; no warmth or erythema; no skin changes; ROM intact  -fracture: xray without concern  -compartment syndrome: area not tense; neurovascularly intact   Social Determinants of Health:  homeless         Final Clinical Impression(s) / ED Diagnoses Final diagnoses:  Chronic pain of left knee  Rash    Rx / DC Orders ED Discharge Orders     None         Dorthy Cooler, [place] 11/28/23 2952    Wynetta Fines, MD 11/29/23 1005

## 2023-11-28 NOTE — ED Notes (Signed)
 RN notified pharmacy about missing dose. Medication is en route

## 2023-11-28 NOTE — ED Notes (Signed)
 Pt states she cannot leave d/t not having transportation. RN offered bus pass. Pt became tearful and stated she cannot take the bus. Pt says she cannot walk. Pt sat herself on the edge of bed.   Pt says she cannot walk d/t leg. RN informed pt scans were normal and pt will have to alternate taking Tylenol and Ibuprofen to help with pain relief.  RN helped pt to the restroom.   Pharmacy notified to send prescription through tube station since pt u/t to get to local pharmacy.

## 2023-11-30 ENCOUNTER — Emergency Department (HOSPITAL_COMMUNITY)
Admission: EM | Admit: 2023-11-30 | Discharge: 2023-11-30 | Disposition: A | Discharge status: Home / Self Care | Attending: Emergency Medicine | Admitting: Emergency Medicine

## 2023-11-30 ENCOUNTER — Encounter (HOSPITAL_COMMUNITY): Payer: Self-pay

## 2023-11-30 ENCOUNTER — Other Ambulatory Visit: Payer: Self-pay

## 2023-11-30 DIAGNOSIS — Z5900 Homelessness unspecified: Secondary | ICD-10-CM | POA: Diagnosis not present

## 2023-11-30 DIAGNOSIS — M25562 Pain in left knee: Secondary | ICD-10-CM | POA: Diagnosis present

## 2023-11-30 LAB — CBG MONITORING, ED: Glucose-Capillary: 97 mg/dL (ref 70–99)

## 2023-11-30 NOTE — Progress Notes (Signed)
 Orthopedic Tech Progress Note Patient Details:  JYLA HOPF 27-Sep-1950 [id number]  Ortho Devices Type of Ortho Device: Knee Sleeve Ortho Device/Splint Location: LLE Ortho Device/Splint Interventions: Ordered, Adjustment   Post Interventions Patient Tolerated: Well Instructions Provided: Care of device, Adjustment of device Patient fitted with knee sleeve, 3rd once she has received this month. Patient also requesting something to eat, notified RN via secure chat. Darleen Crocker 11/30/2023, 10:38 AM

## 2023-11-30 NOTE — Progress Notes (Signed)
 TOC consulted for homelessness. Pt with multiple ED visits within the past couple weeks. CSW noted pt visited [facility] ED on 3/15 and was provided FL2 reflecting ALF. CSW will follow up with community partner to inquire about receiving form. Per chart review, pt has Medicaid and will need to qualify for special assistance Medicaid in order to be placed into ALF. This process should be initiated at DSS. Medicare will not cover long term care placement.   TOC was previously consulted for SNF on 3/3 and addressed by this writer on 3/4. As noted previously, pt not a candidate for SNF due to experiencing homelessness: there must be a safe discharge plan after short term rehab and if not, SNFs will not extend bed offer and decline pt due to experiencing homelessness. Additionally, pt was evaluated by PT and was not recommended for SNF. PT noted in Clinical Impression "No further acute PT needs identified," and in PT assessment "patient does not need any further PT services." Unfortunately, pt must be recommended for STR at SNF in order for insurance to consider approving (approvals are not guaranteed).   Patient was provided shelter resources on last few visits. As noted previously, CSW and patient was offered to be transferred to Hotline to be contacted for an available bed at [facility] and pt stated she only had 6% on her phone. CSW was instructed to have pt return to [employer] at 7:30 PM to be brought in by a case manager so that she could retrieve her belongings, receive dinner, and have a place to sleep. CSW also wrote this information on pt's AVS as pt stated she may not remember it all. Pt was provided [employer] to [facility] and then bus passes to get to [employer]. CSW advised pt to provide bus driver with ticket and let them know she needed to go to [employer] and they would assist her with locating which stop to exit.   At this time, CSW will attempt to reach community partner  to collaborate on resources for pt. Will inquire about whether pt is able to qualify for extended stay covered by [employer] [facility]. CSW has outreached to [employer] Nurse via secure chat. Awaiting response.

## 2023-11-30 NOTE — Discharge Instructions (Signed)
 Our social worker has set up housing for you at the [facility].  We have set up transport for you to go there as well.  I have given you a brace for your knee and recommend that you rest and elevate it and take Tylenol/ibuprofen as needed for pain.  I have also given you a referral to orthopedics with a number to call to schedule appointment for follow-up.  Return if development of any new or worsening symptoms.

## 2023-11-30 NOTE — ED Notes (Signed)
Safe [facility] called 

## 2023-11-30 NOTE — ED Triage Notes (Addendum)
 Pt BIB EMS from Home due to left knee for about a week. Pt reports falling three time on left knee. Pt was at [facility] hospital where the left knee was drained, pain continues. Pt hard of hearing.  In Route BP 150/82 HR 62

## 2023-11-30 NOTE — ED Provider Notes (Signed)
 [place] EMERGENCY DEPARTMENT AT [facility] Provider Note   CSN: [id number] Arrival date & time: 11/30/23  8295     History  Chief Complaint  Patient presents with   Knee Pain    Left    [name] is a 73 y.o. female.  Patient with history of hypertension, osteoporosis, homelessness presents today with complaints of left knee pain. She has been seen for this several times over the past few weeks. She states that the pain initially began after she fell and has been persistent. She denies any new trauma or changes from previous visit. Denies fevers or chills. Notes she is homeless and is having difficulty finding housing. Has been seen by social work and evaluated by PT and does not meet criteria for SNF or rehab as she is able to walk with a rollator.  The history is provided by the patient. No language interpreter was used.  Knee Pain      Home Medications Prior to Admission medications   Medication Sig Start Date End Date Taking? Authorizing Provider  albuterol (PROVENTIL HFA;VENTOLIN HFA) 108 (90 BASE) MCG/ACT inhaler Inhale 2 puffs into the lungs every 4 (four) hours as needed for wheezing or shortness of breath.    [provider]  buPROPion (WELLBUTRIN XL) 150 MG 24 hr tablet Take 150 mg by mouth at bedtime.    [provider]  busPIRone (BUSPAR) 5 MG tablet Take 5 mg by mouth 3 (three) times daily. Patient not taking: Reported on 11/17/2023 11/07/23   [provider]  diphenhydrAMINE (BENADRYL) 25 mg capsule Take 75 mg by mouth at bedtime.    [provider]  hydrOXYzine (ATARAX) 50 MG tablet Take 50 mg by mouth 2 (two) times daily. Patient not taking: Reported on 11/17/2023 08/30/23   [provider]  ibuprofen (ADVIL) 600 MG tablet Take 600 mg by mouth every 6 (six) hours as needed. 11/12/23   [provider]  LORazepam (ATIVAN) 1 MG tablet Take 1 tablet (1 mg total) by mouth 2 (two) times daily as needed  for up to 3 days for anxiety ("Do not take with narcotic medications. Must choose between one or the other."). Patient not taking: Reported on 11/17/2023 07/19/21 03/31/22  Lorin Glass, MD  mupirocin ointment (BACTROBAN) 2 % Apply 1 Application topically 2 (two) times daily. 11/28/23   Dorthy Cooler, PA-C  naproxen (NAPROSYN) 500 MG tablet Take 1 tablet (500 mg total) by mouth 2 (two) times daily. 11/24/23   Durwin Glaze, MD  risperiDONE (RISPERDAL) 1 MG tablet Take 1 mg by mouth at bedtime. Patient not taking: Reported on 11/17/2023 11/04/23   [provider]  traZODone (DESYREL) 100 MG tablet Take 200 mg by mouth at bedtime. 02/17/22   [provider]  venlafaxine XR (EFFEXOR-XR) 150 MG 24 hr capsule Take 150 mg by mouth at bedtime. Patient not taking: Reported on 11/17/2023    [provider]      Allergies    Penicillins and Vancomycin    Review of Systems   Review of Systems  Musculoskeletal:  Positive for arthralgias and myalgias.  All other systems reviewed and are negative.   Physical Exam Updated Vital Signs BP (!) 145/90   Pulse 61   Temp 97.8 F (36.6 C)   Resp 18   SpO2 99%  Physical Exam Vitals and nursing note reviewed.  Constitutional:      General: She is not in acute distress.  Appearance: Normal appearance. She is normal weight. She is not ill-appearing, toxic-appearing or diaphoretic.  HENT:     Head: Normocephalic and atraumatic.  Cardiovascular:     Rate and Rhythm: Normal rate.  Pulmonary:     Effort: Pulmonary effort is normal. No respiratory distress.  Musculoskeletal:        General: Normal range of motion.     Cervical back: Normal range of motion.     Comments: Mild swelling of left knee without erythema, warmth, deformity, or significant pain to palpation. ROM intact with minimal discomfort. DP and PT pulses intact and 2+  Skin:    General: Skin is warm and dry.  Neurological:     General: No focal deficit present.      Mental Status: She is alert.  Psychiatric:        Mood and Affect: Mood normal.        Behavior: Behavior normal.     ED Results / Procedures / Treatments   Labs (all labs ordered are listed, but only abnormal results are displayed) Labs Reviewed  CBG MONITORING, ED    EKG None  Radiology No results found.  Procedures Procedures    Medications Ordered in ED Medications - No data to display  ED Course/ Medical Decision Making/ A&P                                 Medical Decision Making  Patient returns today with complaints of persistent left knee pain. She is afebrile, non-toxic appearing, and in no acute distress with reassuring vital signs. Physical exam reveals mild swelling without erythema, warmth, deformity, or overlying skin changes. Chart reviewed, patient has been seen 4 times for this previously. Did have therapeutic arthrocentesis on 3/3. Has had several repeat normal knee x-rays with the last being on 3/15.  No indication for repeat x-ray at this time.  No signs or symptoms to suggest septic arthritis.  No indication for repeat arthrocentesis.  She is able to walk at her baseline.  Given brace with symptomatic improvement.  Will give referral to orthopedics for follow-up as well.  Social work consulted as they have seen her previously, they have given her a ride to the [facility] and several resources for housing assistance as well which seems like the predominant etiology of her presentation today.  Safe transport has been called to take her to the [facility]. Evaluation and diagnostic testing in the emergency department does not suggest an emergent condition requiring admission or immediate intervention beyond what has been performed at this time.  Plan for discharge with close PCP follow-up.  Patient is understanding and amenable with plan, educated on red flag symptoms that would prompt immediate return.  Patient discharged in stable condition.  Final Clinical Impression(s) /  ED Diagnoses Final diagnoses:  Left knee pain, unspecified chronicity    Rx / DC Orders ED Discharge Orders     None     An After Visit Summary was printed and given to the patient.     Vear Clock 11/30/23 1247    Derwood Kaplan, MD 12/02/23 1125

## 2023-11-30 NOTE — Progress Notes (Signed)
 CSW spoke with [facility] Nurse by phone and encouraged to outreach to DSS to apply for special assistance Medicaid so pt can get into ALF. CSW also advised that pt would need TB and Covid test. Per RN, pt has PCP appt on 3/26 and will request that she receives those at that time. CSW inquired about pt being able to be housed in hotel covered by [employer] [facility] and RN informed it is once in a lifetime funding and she'd have to request special approval. CSW advised to do so until pt can get into [employer]. Congregational Nurse will await for pt to arrive to [facility] so that they can give DSS a call as pt will likely need to provide permission for DSS rep to work with RN.   CSW spoke with pt at bedside to inform of plan. Pt states she's been staying at a hotel the past 3 nights and is now out of money. She states her belongings are still at the hotel. CSW informed she'd inquire about [employer] [facility] assisting her in retrieving them via secure chat. CSW informed pt we would provide transport to [facility] and advised to follow up with [facility]. Pt verbalized understanding. Pt then inquired about when she can "see the place" referring to [employer] then stating "Im not living in a slum." CSW notified [facility] Nurse via secure chat for awareness.   Bedside RN has contacted safe transport. No additional TOC needs at this time.

## 2023-12-04 ENCOUNTER — Emergency Department (HOSPITAL_COMMUNITY)

## 2023-12-04 ENCOUNTER — Other Ambulatory Visit: Payer: Self-pay

## 2023-12-04 ENCOUNTER — Emergency Department (HOSPITAL_COMMUNITY)
Admission: EM | Admit: 2023-12-04 | Discharge: 2023-12-08 | Disposition: A | Discharge status: Skilled Nursing Facility | Attending: Emergency Medicine | Admitting: Emergency Medicine

## 2023-12-04 ENCOUNTER — Other Ambulatory Visit: Payer: Self-pay | Admitting: Nurse Practitioner

## 2023-12-04 ENCOUNTER — Encounter (HOSPITAL_COMMUNITY): Payer: Self-pay

## 2023-12-04 DIAGNOSIS — Z5900 Homelessness unspecified: Secondary | ICD-10-CM | POA: Insufficient documentation

## 2023-12-04 DIAGNOSIS — Z1231 Encounter for screening mammogram for malignant neoplasm of breast: Secondary | ICD-10-CM

## 2023-12-04 DIAGNOSIS — W19XXXA Unspecified fall, initial encounter: Secondary | ICD-10-CM | POA: Insufficient documentation

## 2023-12-04 DIAGNOSIS — M25562 Pain in left knee: Secondary | ICD-10-CM | POA: Diagnosis not present

## 2023-12-04 DIAGNOSIS — R5381 Other malaise: Secondary | ICD-10-CM

## 2023-12-04 DIAGNOSIS — M25552 Pain in left hip: Secondary | ICD-10-CM | POA: Diagnosis present

## 2023-12-04 MED ORDER — HYDROCODONE-ACETAMINOPHEN 5-325 MG PO TABS
1.0000 | ORAL_TABLET | Freq: Once | ORAL | Status: AC
  Administered 2023-12-04: 1 via ORAL
  Filled 2023-12-04: qty 1

## 2023-12-04 MED ORDER — NAPROXEN 500 MG PO TABS
500.0000 mg | ORAL_TABLET | Freq: Once | ORAL | Status: AC
  Administered 2023-12-04: 500 mg via ORAL
  Filled 2023-12-04: qty 1
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
## 2023-12-04 NOTE — ED Notes (Signed)
 Patient transported to X-ray

## 2023-12-04 NOTE — ED Provider Notes (Signed)
 [place] EMERGENCY DEPARTMENT AT [facility] Provider Note   CSN: [id number] Arrival date & time: 12/04/23  1909     History  Chief Complaint  Patient presents with   [name] is a 73 y.o. female with past medical history of HLD, MDD, OP, drug-seeking behavior, pulmonary nodule, arthritis and chronic left knee pain presents to emergency department via EMS for evaluation of left hip and left knee pain following a fall from her walker today.  She reports that she has been unable to walk following fall. She denies complaints prior to fall today, fevers, head injury, nor LOC.   Fall Pertinent negatives include no chest pain, no abdominal pain, no headaches and no shortness of breath.       Home Medications Prior to Admission medications   Medication Sig Start Date End Date Taking? Authorizing Provider  albuterol (PROVENTIL HFA;VENTOLIN HFA) 108 (90 BASE) MCG/ACT inhaler Inhale 2 puffs into the lungs every 4 (four) hours as needed for wheezing or shortness of breath.    [provider]  buPROPion (WELLBUTRIN XL) 150 MG 24 hr tablet Take 150 mg by mouth at bedtime.    [provider]  busPIRone (BUSPAR) 5 MG tablet Take 5 mg by mouth 3 (three) times daily. Patient not taking: Reported on 11/17/2023 11/07/23   [provider]  diphenhydrAMINE (BENADRYL) 25 mg capsule Take 75 mg by mouth at bedtime.    [provider]  hydrOXYzine (ATARAX) 50 MG tablet Take 50 mg by mouth 2 (two) times daily. Patient not taking: Reported on 11/17/2023 08/30/23   [provider]  ibuprofen (ADVIL) 600 MG tablet Take 600 mg by mouth every 6 (six) hours as needed. 11/12/23   [provider]  LORazepam (ATIVAN) 1 MG tablet Take 1 tablet (1 mg total) by mouth 2 (two) times daily as needed for up to 3 days for anxiety ("Do not take with narcotic medications. Must choose between one or the other."). Patient not taking: Reported on  11/17/2023 07/19/21 03/31/22  Lorin Glass, MD  mupirocin ointment (BACTROBAN) 2 % Apply 1 Application topically 2 (two) times daily. 11/28/23   Dorthy Cooler, PA-C  naproxen (NAPROSYN) 500 MG tablet Take 1 tablet (500 mg total) by mouth 2 (two) times daily. 11/24/23   Durwin Glaze, MD  risperiDONE (RISPERDAL) 1 MG tablet Take 1 mg by mouth at bedtime. Patient not taking: Reported on 11/17/2023 11/04/23   [provider]  traZODone (DESYREL) 100 MG tablet Take 200 mg by mouth at bedtime. 02/17/22   [provider]  venlafaxine XR (EFFEXOR-XR) 150 MG 24 hr capsule Take 150 mg by mouth at bedtime. Patient not taking: Reported on 11/17/2023    [provider]      Allergies    Penicillins and Vancomycin    Review of Systems   Review of Systems  Constitutional:  Negative for chills, fatigue and fever.  HENT:  Negative for ear pain and sore throat.   Eyes:  Negative for pain and visual disturbance.  Respiratory:  Negative for cough, chest tightness, shortness of breath and wheezing.   Cardiovascular:  Negative for chest pain and palpitations.  Gastrointestinal:  Negative for abdominal pain, constipation, diarrhea, nausea and vomiting.  Genitourinary:  Negative for dysuria and hematuria.  Musculoskeletal:  Negative for arthralgias and back pain.  Skin:  Negative for color change and rash.  Neurological:  Negative for dizziness, seizures, syncope, weakness, light-headedness, numbness  and headaches.  All other systems reviewed and are negative.   Physical Exam Updated Vital Signs BP (!) 158/70   Pulse 76   Temp 98.3 F (36.8 C) (Oral)   Resp 18   Ht 5\' 8"  (1.727 m)   Wt 53.9 kg   SpO2 97%   BMI 18.07 kg/m  Physical Exam Vitals and nursing note reviewed.  Constitutional:      General: She is not in acute distress.    Appearance: Normal appearance.  HENT:     Head: Normocephalic and atraumatic.  Eyes:     Conjunctiva/sclera: Conjunctivae normal.  Neck:      Trachea: Trachea normal.  Cardiovascular:     Rate and Rhythm: Normal rate.     Pulses:          Dorsalis pedis pulses are 2+ on the right side and 2+ on the left side.  Pulmonary:     Effort: Pulmonary effort is normal. No respiratory distress.     Breath sounds: Normal breath sounds.  Abdominal:     General: Bowel sounds are normal. There is no distension.     Palpations: Abdomen is soft.     Tenderness: There is no abdominal tenderness.  Musculoskeletal:     Cervical back: Normal range of motion and neck supple. No rigidity or bony tenderness. Normal range of motion.     Thoracic back: No bony tenderness.     Lumbar back: No bony tenderness. Normal range of motion.     Right lower leg: No edema.     Left lower leg: No edema.     Comments: Swelling without warmth nor erythema to left knee.  TTP of left hip and left knee.  PE limited 2/2 patient not wanting to complete PE. Limited ROM of left knee secondary to pain. Full ROM with passive ROM. Able to WB but pain with ambulation  Skin:    General: Skin is warm.     Capillary Refill: Capillary refill takes less than 2 seconds.     Coloration: Skin is not jaundiced or pale.  Neurological:     Mental Status: She is alert and oriented to person, place, and time. Mental status is at baseline.     ED Results / Procedures / Treatments   Labs (all labs ordered are listed, but only abnormal results are displayed) Labs Reviewed - No data to display  EKG None  Radiology DG Knee Complete 4 Views Left Result Date: 12/04/2023 CLINICAL DATA:  Tenderness.  Left-sided knee pain after a fall. EXAM: LEFT KNEE - COMPLETE 4+ VIEW COMPARISON:  11/28/2023 FINDINGS: Postoperative left total knee arthroplasty with patellar femoral component. Components appear well seated. No evidence of acute fracture or dislocation. There is a moderate left knee effusion, developing since previous study. Soft tissue swelling over the prepatellar space may represent  soft tissue injury or bursal fluid. Vascular calcifications. IMPRESSION: 1. No acute displaced fractures identified. 2. Postoperative left knee arthroplasty. 3. Developing left knee effusion and soft tissue swelling. Electronically Signed   By: Burman Nieves M.D.   On: 12/04/2023 22:21   DG Femur Min 2 Views Left Result Date: 12/04/2023 CLINICAL DATA:  Tenderness. Left-sided hip and knee pain after a fall. EXAM: LEFT FEMUR 2 VIEWS COMPARISON:  Left knee 11/28/2023.  Left femur 04/04/2023 FINDINGS: Screw fixation of the inter trochanteric left proximal femur. Left total knee arthroplasty. Visualized hardware appears intact. No evidence of acute fracture or dislocation of the left femur. Soft tissue  calcifications are likely dystrophic. IMPRESSION: Postoperative changes as above.  No acute bony abnormalities. Electronically Signed   By: Burman Nieves M.D.   On: 12/04/2023 22:20   DG Hip Unilat W or Wo Pelvis 2-3 Views Left Result Date: 12/04/2023 CLINICAL DATA:  Tenderness. Left-sided hip and knee pain after a fall. EXAM: DG HIP (WITH OR WITHOUT PELVIS) 2-3V LEFT COMPARISON:  11/23/2023 FINDINGS: Postoperative changes with 2 fixation screws in the left hip and right hip arthroplasty. Hardware components appear unchanged since the prior study. No evidence of acute fracture or dislocation of the pelvis or left hip. Degenerative changes in the left hip. Degenerative changes in the lower lumbar spine. SI joints and symphysis pubis are not displaced. Soft tissue calcifications likely represent injection granulomas. IMPRESSION: Postoperative changes as above. Degenerative changes in the left hip. No acute displaced fractures are identified. Electronically Signed   By: Burman Nieves M.D.   On: 12/04/2023 22:19    Procedures Procedures    Medications Ordered in ED Medications  naproxen (NAPROSYN) tablet 500 mg (500 mg Oral Given 12/04/23 2227)  HYDROcodone-acetaminophen (NORCO/VICODIN) 5-325 MG per  tablet 1 tablet (1 tablet Oral Given 12/04/23 2248)    ED Course/ Medical Decision Making/ A&P                                 Medical Decision Making Amount and/or Complexity of Data Reviewed Radiology: ordered.  Risk Prescription drug management.   Patient presents to the ED for concern of left knee and hip pain following fall, this involves an extensive number of treatment options, and is a complaint that carries with it a high risk of complications and morbidity.  The differential diagnosis includes fracture, contusion, muscle strain   Co morbidities that complicate the patient evaluation  See HPI   Additional history obtained:  Additional history obtained from Nursing and Outside Medical Records   External records from outside source obtained and reviewed including  Triage RN note Multiple ED visits for left knee pain on 11/30/2023, 11/28/2023, 11/23/2023, 11/22/2023, 11/16/2023 and for left hip pain on 06/26/2023, 04/04/2023, 02/27/2023    Imaging Studies ordered:  I ordered imaging studies including XR left hip, femur, and knee  I independently visualized and interpreted imaging which showed no acute fracture. Joint swelling I agree with the radiologist interpretation    Medicines ordered and prescription drug management:  I ordered medication including norco  for pain  Reevaluation of the patient after these medicines showed that the patient improved I have reviewed the patients home medicines and have made adjustments as needed     Problem List / ED Course:  Fall, initial encounter She has had multiple visits for left hip and left knee pain and multiple falls.  Had PT and OT and TTS for possible SNF placement due to limited ambulation on 11/16/23 however was unable to be placed Today following fall, she does have moderate joint effusion which is likely hemarthrosis 2/2 traumatic injury.  It is not warm nor red hot to touch making septic joint very unlikely Provided  norco for pain with some improvement X-rays are negative for acute fracture so will provide knee immobilizer and have patient follow-up with Ortho outpatient Was able to ambulate to bathroom with walker per baseline Upon reassessment, patient was sleeping in room ON left side that was reported to be most painful on exam Discussed symptomatic management to include RICE and analgesia  Discussed ED workup, disposition, return to ED precautions with patient who expresses understanding agrees with plan.  All questions answered to their satisfaction.  They are agreeable to plan.  Discharge instructions provided on paperwork   Reevaluation:  After the interventions noted above, I reevaluated the patient and found that they have :stayed the same   Social Determinants of Health:  homeless   Dispostion:  After consideration of the diagnostic results and the patients response to treatment, I feel that the patent would benefit from outpatient management with ortho follow up.    Final Clinical Impression(s) / ED Diagnoses Final diagnoses:  Fall, initial encounter  Acute pain of left knee  Left hip pain    Rx / DC Orders ED Discharge Orders     None         Judithann Sheen, PA 12/05/23 0022    Terald Sleeper, MD 12/05/23 [phone]

## 2023-12-04 NOTE — ED Provider Notes (Incomplete)
 [place] EMERGENCY DEPARTMENT AT [facility] Provider Note   CSN: [id number] Arrival date & time: 12/04/23  1909     History {Add pertinent medical, surgical, social history, OB history to HPI:1} Chief Complaint  Patient presents with  . Fall    [name] is a 73 y.o. female with past medical history of HLD, MDD, OP, drug-seeking behavior, pulmonary nodule, arthritis and chronic left knee pain presents to emergency department via EMS for evaluation of left hip and left knee pain following a fall from her walker today.  She reports that she has been unable to walk following fall. She denies complaints prior to fall today, fevers   Fall Pertinent negatives include no chest pain, no abdominal pain, no headaches and no shortness of breath.       Home Medications Prior to Admission medications   Medication Sig Start Date End Date Taking? Authorizing Provider  albuterol (PROVENTIL HFA;VENTOLIN HFA) 108 (90 BASE) MCG/ACT inhaler Inhale 2 puffs into the lungs every 4 (four) hours as needed for wheezing or shortness of breath.    [provider]  buPROPion (WELLBUTRIN XL) 150 MG 24 hr tablet Take 150 mg by mouth at bedtime.    [provider]  busPIRone (BUSPAR) 5 MG tablet Take 5 mg by mouth 3 (three) times daily. Patient not taking: Reported on 11/17/2023 11/07/23   [provider]  diphenhydrAMINE (BENADRYL) 25 mg capsule Take 75 mg by mouth at bedtime.    [provider]  hydrOXYzine (ATARAX) 50 MG tablet Take 50 mg by mouth 2 (two) times daily. Patient not taking: Reported on 11/17/2023 08/30/23   [provider]  ibuprofen (ADVIL) 600 MG tablet Take 600 mg by mouth every 6 (six) hours as needed. 11/12/23   [provider]  LORazepam (ATIVAN) 1 MG tablet Take 1 tablet (1 mg total) by mouth 2 (two) times daily as needed for up to 3 days for anxiety ("Do not take with narcotic medications. Must choose between one or the  other."). Patient not taking: Reported on 11/17/2023 07/19/21 03/31/22  Lorin Glass, MD  mupirocin ointment (BACTROBAN) 2 % Apply 1 Application topically 2 (two) times daily. 11/28/23   Dorthy Cooler, PA-C  naproxen (NAPROSYN) 500 MG tablet Take 1 tablet (500 mg total) by mouth 2 (two) times daily. 11/24/23   Durwin Glaze, MD  risperiDONE (RISPERDAL) 1 MG tablet Take 1 mg by mouth at bedtime. Patient not taking: Reported on 11/17/2023 11/04/23   [provider]  traZODone (DESYREL) 100 MG tablet Take 200 mg by mouth at bedtime. 02/17/22   [provider]  venlafaxine XR (EFFEXOR-XR) 150 MG 24 hr capsule Take 150 mg by mouth at bedtime. Patient not taking: Reported on 11/17/2023    [provider]      Allergies    Penicillins and Vancomycin    Review of Systems   Review of Systems  Constitutional:  Negative for chills, fatigue and fever.  HENT:  Negative for ear pain and sore throat.   Eyes:  Negative for pain and visual disturbance.  Respiratory:  Negative for cough, chest tightness, shortness of breath and wheezing.   Cardiovascular:  Negative for chest pain and palpitations.  Gastrointestinal:  Negative for abdominal pain, constipation, diarrhea, nausea and vomiting.  Genitourinary:  Negative for dysuria and hematuria.  Musculoskeletal:  Negative for arthralgias and back pain.  Skin:  Negative for color change and rash.  Neurological:  Negative for dizziness,  seizures, syncope, weakness, light-headedness, numbness and headaches.  All other systems reviewed and are negative.   Physical Exam Updated Vital Signs BP (!) 158/70   Pulse 76   Temp 98.3 F (36.8 C) (Oral)   Resp 18   Ht 5\' 8"  (1.727 m)   Wt 53.9 kg   SpO2 97%   BMI 18.07 kg/m  Physical Exam Vitals and nursing note reviewed.  Constitutional:      General: She is not in acute distress.    Appearance: Normal appearance.  HENT:     Head: Normocephalic and atraumatic.  Eyes:      Conjunctiva/sclera: Conjunctivae normal.  Cardiovascular:     Rate and Rhythm: Normal rate.  Pulmonary:     Effort: Pulmonary effort is normal. No respiratory distress.     Breath sounds: Normal breath sounds.  Musculoskeletal:     Right lower leg: No edema.     Left lower leg: No edema.     Comments: Swelling without warmth nor erythema to left knee.  TTP of left hip and left knee.  Limited ROM of left knee secondary to pain  Skin:    Capillary Refill: Capillary refill takes less than 2 seconds.     Coloration: Skin is not jaundiced or pale.  Neurological:     Mental Status: She is alert and oriented to person, place, and time. Mental status is at baseline.     ED Results / Procedures / Treatments   Labs (all labs ordered are listed, but only abnormal results are displayed) Labs Reviewed - No data to display  EKG None  Radiology DG Knee Complete 4 Views Left Result Date: 12/04/2023 CLINICAL DATA:  Tenderness.  Left-sided knee pain after a fall. EXAM: LEFT KNEE - COMPLETE 4+ VIEW COMPARISON:  11/28/2023 FINDINGS: Postoperative left total knee arthroplasty with patellar femoral component. Components appear well seated. No evidence of acute fracture or dislocation. There is a moderate left knee effusion, developing since previous study. Soft tissue swelling over the prepatellar space may represent soft tissue injury or bursal fluid. Vascular calcifications. IMPRESSION: 1. No acute displaced fractures identified. 2. Postoperative left knee arthroplasty. 3. Developing left knee effusion and soft tissue swelling. Electronically Signed   By: Burman Nieves M.D.   On: 12/04/2023 22:21   DG Femur Min 2 Views Left Result Date: 12/04/2023 CLINICAL DATA:  Tenderness. Left-sided hip and knee pain after a fall. EXAM: LEFT FEMUR 2 VIEWS COMPARISON:  Left knee 11/28/2023.  Left femur 04/04/2023 FINDINGS: Screw fixation of the inter trochanteric left proximal femur. Left total knee arthroplasty.  Visualized hardware appears intact. No evidence of acute fracture or dislocation of the left femur. Soft tissue calcifications are likely dystrophic. IMPRESSION: Postoperative changes as above.  No acute bony abnormalities. Electronically Signed   By: Burman Nieves M.D.   On: 12/04/2023 22:20   DG Hip Unilat W or Wo Pelvis 2-3 Views Left Result Date: 12/04/2023 CLINICAL DATA:  Tenderness. Left-sided hip and knee pain after a fall. EXAM: DG HIP (WITH OR WITHOUT PELVIS) 2-3V LEFT COMPARISON:  11/23/2023 FINDINGS: Postoperative changes with 2 fixation screws in the left hip and right hip arthroplasty. Hardware components appear unchanged since the prior study. No evidence of acute fracture or dislocation of the pelvis or left hip. Degenerative changes in the left hip. Degenerative changes in the lower lumbar spine. SI joints and symphysis pubis are not displaced. Soft tissue calcifications likely represent injection granulomas. IMPRESSION: Postoperative changes as above. Degenerative changes in  the left hip. No acute displaced fractures are identified. Electronically Signed   By: Burman Nieves M.D.   On: 12/04/2023 22:19    Procedures Procedures  {Document cardiac monitor, telemetry assessment procedure when appropriate:1}  Medications Ordered in ED Medications  naproxen (NAPROSYN) tablet 500 mg (500 mg Oral Given 12/04/23 2227)  HYDROcodone-acetaminophen (NORCO/VICODIN) 5-325 MG per tablet 1 tablet (1 tablet Oral Given 12/04/23 2248)    ED Course/ Medical Decision Making/ A&P   {   Click here for ABCD2, HEART and other calculatorsREFRESH Note before signing :1}                              Medical Decision Making Amount and/or Complexity of Data Reviewed Radiology: ordered.  Risk Prescription drug management.     Patient presents to the ED for concern of ***, this involves an extensive number of treatment options, and is a complaint that carries with it a high risk of complications  and morbidity.  The differential diagnosis includes ***   Co morbidities that complicate the patient evaluation  ***   Additional history obtained:  Additional history obtained from *** {Blank multiple:19196::"EMS","Family","Nursing","Outside Medical Records","Past Admission"}   External records from outside source obtained and reviewed including ***   Lab Tests:  I Ordered, and personally interpreted labs.  The pertinent results include:  ***   Imaging Studies ordered:  I ordered imaging studies including ***  I independently visualized and interpreted imaging which showed *** I agree with the radiologist interpretation   Cardiac Monitoring:  The patient was maintained on a cardiac monitor.  I personally viewed and interpreted the cardiac monitored which showed an underlying rhythm of: ***   Medicines ordered and prescription drug management:  I ordered medication including ***  for ***  Reevaluation of the patient after these medicines showed that the patient {resolved/improved/worsened:23923::"improved"} I have reviewed the patients home medicines and have made adjustments as needed   Test Considered:  ***   Critical Interventions:  ***   Consultations Obtained:  I requested consultation with the ***,  and discussed lab and imaging findings as well as pertinent plan - they recommend: ***   Problem List / ED Course:  ***   Reevaluation:  After the interventions noted above, I reevaluated the patient and found that they have :{resolved/improved/worsened:23923::"improved"}   Social Determinants of Health:  ***   Dispostion:  After consideration of the diagnostic results and the patients response to treatment, I feel that the patent would benefit from ***.    {Document critical care time when appropriate:1} {Document review of labs and clinical decision tools ie heart score, Chads2Vasc2 etc:1}  {Document your independent review of radiology  images, and any outside records:1} {Document your discussion with family members, caretakers, and with consultants:1} {Document social determinants of health affecting pt's care:1} {Document your decision making why or why not admission, treatments were needed:1} Final Clinical Impression(s) / ED Diagnoses Final diagnoses:  None    Rx / DC Orders ED Discharge Orders     None

## 2023-12-05 DIAGNOSIS — M25552 Pain in left hip: Secondary | ICD-10-CM | POA: Diagnosis not present

## 2023-12-05 LAB — CBC
HCT: 43 % (ref 36.0–46.0)
Hemoglobin: 13.6 g/dL (ref 12.0–15.0)
MCH: 30.2 pg (ref 26.0–34.0)
MCHC: 31.6 g/dL (ref 30.0–36.0)
MCV: 95.3 fL (ref 80.0–100.0)
Platelets: 410 10*3/uL — ABNORMAL HIGH (ref 150–400)
RBC: 4.51 MIL/uL (ref 3.87–5.11)
RDW: 15.6 % — ABNORMAL HIGH (ref 11.5–15.5)
WBC: 6.3 10*3/uL (ref 4.0–10.5)
nRBC: 0 % (ref 0.0–0.2)

## 2023-12-05 LAB — URINALYSIS, ROUTINE W REFLEX MICROSCOPIC
Bilirubin Urine: NEGATIVE
Glucose, UA: NEGATIVE mg/dL
Hgb urine dipstick: NEGATIVE
Ketones, ur: NEGATIVE mg/dL
Leukocytes,Ua: NEGATIVE
Nitrite: NEGATIVE
Protein, ur: NEGATIVE mg/dL
Specific Gravity, Urine: 1.026 (ref 1.005–1.030)
pH: 6 (ref 5.0–8.0)

## 2023-12-05 LAB — COMPREHENSIVE METABOLIC PANEL
ALT: 12 U/L (ref 0–44)
AST: 21 U/L (ref 15–41)
Albumin: 4.5 g/dL (ref 3.5–5.0)
Alkaline Phosphatase: 57 U/L (ref 38–126)
Anion gap: 8 (ref 5–15)
BUN: 34 mg/dL — ABNORMAL HIGH (ref 8–23)
CO2: 23 mmol/L (ref 22–32)
Calcium: 9.7 mg/dL (ref 8.9–10.3)
Chloride: 105 mmol/L (ref 98–111)
Creatinine, Ser: 0.7 mg/dL (ref 0.44–1.00)
GFR, Estimated: >60 mL/min (ref >60)
Glucose, Bld: 109 mg/dL — ABNORMAL HIGH (ref 70–99)
Potassium: 4.4 mmol/L (ref 3.5–5.1)
Sodium: 136 mmol/L (ref 135–145)
Total Bilirubin: 0.7 mg/dL (ref 0.0–1.2)
Total Protein: 8.1 g/dL (ref 6.5–8.1)

## 2023-12-05 MED ORDER — ALBUTEROL SULFATE (2.5 MG/3ML) 0.083% IN NEBU: 2.5000 mg | INHALATION_SOLUTION | RESPIRATORY_TRACT | Status: DC | PRN

## 2023-12-05 MED ORDER — HYDROXYZINE HCL 25 MG PO TABS
25.0000 mg | ORAL_TABLET | Freq: Four times a day (QID) | ORAL | Status: DC | PRN
  Administered 2023-12-07: 25 mg via ORAL
  Filled 2023-12-05: qty 1

## 2023-12-05 MED ORDER — ACETAMINOPHEN 325 MG PO TABS
650.0000 mg | ORAL_TABLET | Freq: Four times a day (QID) | ORAL | Status: DC | PRN
  Filled 2023-12-05: qty 2

## 2023-12-05 MED ORDER — ALBUTEROL SULFATE HFA 108 (90 BASE) MCG/ACT IN AERS: 2.0000 | INHALATION_SPRAY | RESPIRATORY_TRACT | Status: DC | PRN

## 2023-12-05 MED ORDER — IBUPROFEN 200 MG PO TABS
400.0000 mg | ORAL_TABLET | Freq: Three times a day (TID) | ORAL | Status: DC | PRN
  Administered 2023-12-05: 400 mg via ORAL
  Filled 2023-12-05: qty 2

## 2023-12-05 MED ORDER — VENLAFAXINE HCL ER 75 MG PO CP24
150.0000 mg | ORAL_CAPSULE | Freq: Every day | ORAL | Status: DC
  Administered 2023-12-06 – 2023-12-07 (×3): 150 mg via ORAL
  Filled 2023-12-05 (×3): qty 2

## 2023-12-05 MED ORDER — BUPROPION HCL ER (XL) 150 MG PO TB24: 150.0000 mg | ORAL_TABLET | Freq: Every day | ORAL | Status: DC

## 2023-12-05 MED ORDER — BUPROPION HCL ER (XL) 150 MG PO TB24
150.0000 mg | ORAL_TABLET | Freq: Every day | ORAL | Status: DC
  Administered 2023-12-06 – 2023-12-07 (×3): 150 mg via ORAL
  Filled 2023-12-05 (×3): qty 1

## 2023-12-05 NOTE — Progress Notes (Signed)
 Per chart review, pt brought in  from bus stop for fall. Pt reporting hip and knee pain. Per NT, pt states she is unable to walk due to pain.  CSW staffed case with EDP who will order PT to see pt. TOC to follow for PT recommendations. If pt is recommended for SNF, TOC will attempt to place for short term rehab. Rehab stay will depend on PT rec for SNF, bed offers, and insurance authorization.

## 2023-12-05 NOTE — Discharge Instructions (Signed)
 It was our pleasure to provide your ER care today - we hope that you feel better.  Fall precautions. Use walker and great care/caution to help decrease risk of falling.   Take acetaminophen or ibuprofen as need.   Follow up closely with primary care doctor in the coming week.  Return to ER if worse, new symptoms, fevers, new/severe pain, chest pain, trouble breathing, or other concern.

## 2023-12-05 NOTE — ED Notes (Signed)
 When talking to pt about her pending D/C pt states " I can't leave I have no where to go." I asked pt where she normally stays and pt became verbally aggressive and stated she stayed on the streets. I told pt we could get her a bus pass to the [facility] and pt stated " they aren't any help they are only open till 3.'" Pt then stated " you can't put me out I'm 73 and homeless." I explained to the pt that the ER doesn't help with housing but we could provide her with a list of resources. Pt the stated " just give me my paperwork and I can go walking."   Pt then asked for something to eat. Pt provided with a sandwich and ice water.

## 2023-12-05 NOTE — Progress Notes (Signed)
 Orthopedic Tech Progress Note Patient Details:  DREAM HARMAN 11-Sep-1951 [id number]  Patient ID: Eugene Garnet, female   DOB: [birth year], 73 y.o.   MRN: [id number] Pt. Declined Knee Immobilizer. Stated it was her hip that was hurting and the [facility] would not do anything. Tonye Pearson 12/05/2023, 9:48 AM

## 2023-12-05 NOTE — ED Provider Notes (Signed)
 Patient had been seen for fall earlier.   TOC/SW called back - indicates pt well known to them. Is homeless, frequent falls, frequent ED visits. Indications [facility], wellness clinic, congregational nurse, do not feel patient able to navigate safely as current homeless individual.   It appears patient does have current functional decline (fall, knee strain/contusion/sprain/swelling), hip pain/contusion, limited mobility - so may need short term rehab. On chart review, it also appears likely given her trajectory that she may also end up needing long term SNF placement.  It seems appropriate medicaid applications need to be filed by patient or patient advocate, that would help facilitate long term SNF as again, it appears that eventually will be needed.   Will ask PT to evaluate, make recommendations, and begin to improve pts fxn with walker/assistance devices, and improve strength and endurance. TOC to follow and facilitate placement.      Cathren Laine, MD 12/05/23 [phone]

## 2023-12-05 NOTE — ED Notes (Signed)
 Ortho tech reports that pt is refusing knee immobilizer because 'it's my hip that hurts'

## 2023-12-06 DIAGNOSIS — M25552 Pain in left hip: Secondary | ICD-10-CM | POA: Diagnosis not present

## 2023-12-06 MED ORDER — TRAZODONE HCL 100 MG PO TABS
200.0000 mg | ORAL_TABLET | Freq: Every day | ORAL | Status: DC
  Administered 2023-12-06 – 2023-12-07 (×2): 200 mg via ORAL
  Filled 2023-12-06 (×2): qty 2

## 2023-12-06 NOTE — ED Notes (Signed)
 PT at beside

## 2023-12-06 NOTE — Evaluation (Signed)
 Physical Therapy Evaluation Patient Details Name: [name] MRN: [id number] DOB: Mar 03, 1951 Today's Date: 12/06/2023  History of Present Illness  Pt admitted to ED s/p fall and with hx of multiple recent falls.  Pt now with L knee pain/contusion/sprain/swelling and L hip pain/contusion and very limited WB tolerance.  No fx per imaging.  Clinical Impression  Pt admitted as above and presenting with functional mobility limitations 2* decreased L LE strength/ROM, ongoing 8/10 pain causing inability to WB on LE, and balance deficits placing pt at high risk for additional falls.  Patient will benefit from continued inpatient follow up therapy, <3 hours/day to maximize IND and safety.       If plan is discharge home, recommend the following: A lot of help with walking and/or transfers;A little help with bathing/dressing/bathroom;Assistance with cooking/housework;Assist for transportation;Help with stairs or ramp for entrance   Can travel by private vehicle   No    Equipment Recommendations None recommended by PT  Recommendations for Other Services       Functional Status Assessment Patient has had a recent decline in their functional status and demonstrates the ability to make significant improvements in function in a reasonable and predictable amount of time.     Precautions / Restrictions Precautions Precautions: Fall Restrictions Weight Bearing Restrictions Per Provider Order: No      Mobility  Bed Mobility Overal bed mobility: Modified Independent             General bed mobility comments: no physical assist supine<>sit    Transfers Overall transfer level: Needs assistance Equipment used: Rolling walker (2 wheels) Transfers: Sit to/from Stand Sit to Stand: Min assist           General transfer comment: assist to bring wt up and fwd and balance in standing with RW - pt NWB on L LE but able to place foot on floor with cues but still TWB only     Ambulation/Gait Ambulation/Gait assistance: Min assist Gait Distance (Feet): 8 Feet Assistive device: Rolling walker (2 wheels) Gait Pattern/deviations: Decreased step length - right, Knee flexed in stance - left, Shuffle, Trunk flexed Gait velocity: decr     General Gait Details: Pt ambulated very short distance but unable to WB on L LE and requiring assist for balance/support and RW management to avoid falling  Stairs            Wheelchair Mobility     Tilt Bed    Modified Rankin (Stroke Patients Only)       Balance Overall balance assessment: Needs assistance Sitting-balance support: No upper extremity supported, Feet unsupported Sitting balance-Leahy Scale: Good     Standing balance support: Bilateral upper extremity supported Standing balance-Leahy Scale: Poor                               Pertinent Vitals/Pain Pain Assessment Pain Assessment: 0-10 Pain Score: 8  Pain Location: L LE - hip > knee - increased with attempts to WB Pain Descriptors / Indicators: Aching, Grimacing, Sore Pain Intervention(s): Limited activity within patient's tolerance, Monitored during session    Home Living Family/patient expects to be discharged to:: Unsure                   Additional Comments: pt is homeless at this time    Prior Function Prior Level of Function : Independent/Modified Independent  Mobility Comments: ambulatory with rollator but with recent hx of multiple falls       Extremity/Trunk Assessment   Upper Extremity Assessment Upper Extremity Assessment: Overall WFL for tasks assessed    Lower Extremity Assessment Lower Extremity Assessment: LLE deficits/detail LLE Deficits / Details: Pt lacks full knee ext with standing but pt reluctant to allow full assessment 2* pain LLE: Unable to fully assess due to pain    Cervical / Trunk Assessment Cervical / Trunk Assessment: Kyphotic  Communication    Communication Communication: Impaired Factors Affecting Communication: Hearing impaired    Cognition Arousal: Alert Behavior During Therapy: Anxious, WFL for tasks assessed/performed   PT - Cognitive impairments: No apparent impairments                         Following commands: Intact       Cueing Cueing Techniques: Verbal cues     General Comments      Exercises     Assessment/Plan    PT Assessment Patient needs continued PT services  PT Problem List Decreased strength;Decreased range of motion;Decreased activity tolerance;Decreased balance;Decreased mobility;Decreased knowledge of use of DME;Pain       PT Treatment Interventions DME instruction;Gait training;Functional mobility training;Therapeutic activities;Therapeutic exercise;Balance training;Patient/family education    PT Goals (Current goals can be found in the Care Plan section)  Acute Rehab PT Goals Patient Stated Goal: REgain IND; decreased pain PT Goal Formulation: With patient Time For Goal Achievement: 12/20/23 Potential to Achieve Goals: Fair    Frequency Min 3X/week     Co-evaluation               AM-PAC PT "6 Clicks" Mobility  Outcome Measure Help needed turning from your back to your side while in a flat bed without using bedrails?: None Help needed moving from lying on your back to sitting on the side of a flat bed without using bedrails?: None Help needed moving to and from a bed to a chair (including a wheelchair)?: A Little Help needed standing up from a chair using your arms (e.g., wheelchair or bedside chair)?: A Lot Help needed to walk in hospital room?: Total Help needed climbing 3-5 steps with a railing? : Total 6 Click Score: 15    End of Session Equipment Utilized During Treatment: Gait belt Activity Tolerance: Patient limited by pain Patient left: in bed;with nursing/sitter in room Nurse Communication: Mobility status PT Visit Diagnosis: History of falling  (Z91.81);Difficulty in walking, not elsewhere classified (R26.2);Pain Pain - Right/Left: Left Pain - part of body: Leg    Time: 1022-1033 PT Time Calculation (min) (ACUTE ONLY): 11 min   Charges:   PT Evaluation $PT Eval Low Complexity: 1 Low   PT General Charges $$ ACUTE PT VISIT: 1 Visit         Mauro Kaufmann PT Acute Rehabilitation Services Pager [phone] Office [phone]   [facility] 12/06/2023, 12:28 PM

## 2023-12-06 NOTE — Progress Notes (Signed)
 Pt seen and evaluated by PT this afternoon. PT rec SNF, noting patient has had a recent decline in their functional status. PT noted pt walked 8 ft, min assist, with RW and unable to bear weight on L LE requiring assist  balance and support.   CSW has faxed pt out. Bed offers pending.

## 2023-12-06 NOTE — ED Notes (Signed)
 Patient given dinner tray.

## 2023-12-06 NOTE — ED Notes (Signed)
Assisted patient to restroom by wheelchair.

## 2023-12-06 NOTE — NC FL2 (Signed)
 [place] MEDICAID FL2 LEVEL OF CARE FORM     IDENTIFICATION  Patient Name: [name] Birthdate: 1951/03/31 Sex: female Admission Date (Current Location): 12/04/2023  [facility] and [place] Number:  Producer, television/film/video and Address:  [facility],  [address], [place] [address]      Provider Number: [id number]  Attending Physician Name and Address:  System, Provider Not In  Relative Name and Phone Number:       Current Level of Care: Hospital Recommended Level of Care: Skilled Nursing Facility Prior Approval Number:    Date Approved/Denied:   PASRR Number: [id number]  Discharge Plan: SNF    Current Diagnoses: Patient Active Problem List   Diagnosis Date Noted   Hemothorax 07/09/2021   Acute respiratory failure with hypoxia (HCC) 07/09/2021   Acute posthemorrhagic anemia 07/09/2021   Compression fracture of T10 vertebra (HCC) 07/09/2021   Cognitive impairment 07/09/2021   Anxiety 07/09/2021   Arthritis of left knee 03/27/2020   Humeral surgical neck fracture 02/27/2012   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/27/2012   Pulmonary nodule 02/27/2012   Drug-seeking behavior 02/25/2011   ROTATOR CUFF REPAIR, RIGHT, HX OF 03/27/2010   HYPERLIPIDEMIA 02/21/2010   NECK PAIN, CHRONIC 11/17/2007   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   Osteoporosis 11/11/2007    Orientation RESPIRATION BLADDER Height & Weight     Self, Time, Situation, Place  Normal Continent Weight: 118 lb 13.3 oz (53.9 kg) Height:  5\' 8"  (172.7 cm)  BEHAVIORAL SYMPTOMS/MOOD NEUROLOGICAL BOWEL NUTRITION STATUS      Continent Diet (regular)  AMBULATORY STATUS COMMUNICATION OF NEEDS Skin   Limited Assist Verbally Skin abrasions                       Personal Care Assistance Level of Assistance  Bathing, Feeding, Dressing Bathing Assistance: Limited assistance Feeding assistance: Independent Dressing Assistance: Limited assistance     Functional  Limitations Info  Sight, Hearing, Speech Sight Info: Adequate Hearing Info: Impaired (hard of hearing) Speech Info: Adequate    SPECIAL CARE FACTORS FREQUENCY  PT (By licensed PT), OT (By licensed OT)     PT Frequency: x5/week OT Frequency: x5/week            Contractures Contractures Info: Not present    Additional Factors Info  Code Status, Allergies Code Status Info: Full Allergies Info: Penicillins  Vancomycin           Current Medications (12/06/2023):  This is the current hospital active medication list Current Facility-Administered Medications  Medication Dose Route Frequency Provider Last Rate Last Admin   acetaminophen (TYLENOL) tablet 650 mg  650 mg Oral Q6H PRN Cathren Laine, MD       albuterol (PROVENTIL) (2.5 MG/3ML) 0.083% nebulizer solution 2.5 mg  2.5 mg Nebulization Q4H PRN Cathren Laine, MD       buPROPion (WELLBUTRIN XL) 24 hr tablet 150 mg  150 mg Oral QHS Cathren Laine, MD   150 mg at 12/06/23 0033   hydrOXYzine (ATARAX) tablet 25 mg  25 mg Oral Q6H PRN Cathren Laine, MD       ibuprofen (ADVIL) tablet 400 mg  400 mg Oral Q8H PRN Cathren Laine, MD   400 mg at 12/05/23 1835   venlafaxine XR (EFFEXOR-XR) 24 hr capsule 150 mg  150 mg Oral QHS Cathren Laine, MD   150 mg at 12/06/23 5621   Current Outpatient Medications  Medication Sig Dispense Refill   albuterol (  PROVENTIL HFA;VENTOLIN HFA) 108 (90 BASE) MCG/ACT inhaler Inhale 2 puffs into the lungs every 4 (four) hours as needed for wheezing or shortness of breath.     buPROPion (WELLBUTRIN XL) 150 MG 24 hr tablet Take 150 mg by mouth at bedtime. (Patient not taking: Reported on 12/05/2023)     busPIRone (BUSPAR) 5 MG tablet Take 5 mg by mouth 3 (three) times daily. (Patient not taking: Reported on 11/17/2023)     diphenhydrAMINE (BENADRYL) 25 mg capsule Take 75 mg by mouth at bedtime. (Patient not taking: Reported on 12/05/2023)     hydrOXYzine (ATARAX) 50 MG tablet Take 50 mg by mouth 2 (two) times daily.  (Patient not taking: Reported on 11/17/2023)     LORazepam (ATIVAN) 1 MG tablet Take 1 tablet (1 mg total) by mouth 2 (two) times daily as needed for up to 3 days for anxiety ("Do not take with narcotic medications. Must choose between one or the other."). (Patient not taking: Reported on 11/17/2023) 6 tablet 0   mupirocin ointment (BACTROBAN) 2 % Apply 1 Application topically 2 (two) times daily. (Patient not taking: Reported on 12/05/2023) 22 g 0   naproxen (NAPROSYN) 500 MG tablet Take 1 tablet (500 mg total) by mouth 2 (two) times daily. (Patient not taking: Reported on 12/05/2023) 30 tablet 0   risperiDONE (RISPERDAL) 1 MG tablet Take 1 mg by mouth at bedtime. (Patient not taking: Reported on 11/17/2023)     traZODone (DESYREL) 100 MG tablet Take 200 mg by mouth at bedtime. (Patient not taking: Reported on 12/05/2023)     venlafaxine XR (EFFEXOR-XR) 150 MG 24 hr capsule Take 150 mg by mouth at bedtime. (Patient not taking: Reported on 11/17/2023)       Discharge Medications: Please see discharge summary for a list of discharge medications.  Relevant Imaging Results:  Relevant Lab Results:   Additional Information SSN: [id number]  Princella Ion, LCSW

## 2023-12-06 NOTE — Progress Notes (Signed)
Awaiting PT eval.  

## 2023-12-06 NOTE — ED Notes (Signed)
Pt given Breakfast tray

## 2023-12-06 NOTE — ED Notes (Signed)
 Patient given lunch tray.

## 2023-12-06 NOTE — ED Notes (Signed)
Pt assisted to bathroom in wheelchair.

## 2023-12-07 DIAGNOSIS — M25552 Pain in left hip: Secondary | ICD-10-CM | POA: Diagnosis not present

## 2023-12-07 NOTE — Progress Notes (Signed)
 Auth approved. Pt can admit to [facility] tomorrow afternoon.

## 2023-12-07 NOTE — Progress Notes (Addendum)
 CSW spoke with central intake at [facility] to inform of pt's current situation and inquire about pt being able to transition into LTC after STR. Rep has agreed to accept pt for LTC and requested CSW submit insurance auth.   CSW presented bed offer to patient and informed that once transitioned into LTC, she would need to turn her check over to the SNF (all but $70/month) and pt initially stated "ma'am I can't do that. I have a dog I have to pay to get groomed each month." CSW inquired about the dog's location and she stated "with a friend." CSW did inform pt that if she chooses to select to go to SNF, she would have a place to live once transitioned into LTC. Pt did agree and stated she would like to accept bed offer. CSW notified Whitney with central intake. CSW also updated Congregational Nurse via secure chat.   Addend @ 10:10AM CSW initiated [place] via [place]. Auth pending.

## 2023-12-08 MED ORDER — HYDROCODONE-ACETAMINOPHEN 5-325 MG PO TABS: 1.0000 | ORAL_TABLET | Freq: Four times a day (QID) | ORAL | 0 refills | Status: AC | PRN

## 2023-12-08 NOTE — ED Notes (Signed)
 Lm for [facility] to cb for report.

## 2023-12-08 NOTE — ED Provider Notes (Signed)
  Physical Exam  BP (!) 131/97 (BP Location: Left Arm)   Pulse 67   Temp 98.2 F (36.8 C) (Oral)   Resp 16   Ht 5\' 8"  (1.727 m)   Wt 53.9 kg   SpO2 95%   BMI 18.07 kg/m   Physical Exam  Procedures  Procedures  ED Course / MDM    Medical Decision Making Amount and/or Complexity of Data Reviewed Labs: ordered. Radiology: ordered.  Risk OTC drugs. Prescription drug management.   Patient be transferred to [facility], hopefully this afternoon.       Benjiman Core, MD 12/08/23 [phone]

## 2023-12-08 NOTE — Progress Notes (Signed)
 Patient will discharge to [facility] today. [name] to transport- pick up time scheduled for 11:30. Provided room and report info to RN via secure chat. EDP aware of discharge.   Congregational Nurse made aware that patients Berkley Harvey is approved and she will discharge today. Discussed that if pt is dissatisfied with facility while in LTC, she should work with SW at facility to transition to a different SNF. RN verbalized understanding.

## 2023-12-08 NOTE — ED Notes (Signed)
 Patient sleeping at this time.

## 2024-04-08 ENCOUNTER — Other Ambulatory Visit (HOSPITAL_COMMUNITY): Payer: Self-pay
